# Patient Record
Sex: Male | Born: 1945 | Race: Black or African American | Hispanic: No | Marital: Married | Smoking: Never smoker
Health system: Southern US, Community
[De-identification: ages and names within clinical notes are randomized; demographics above are authoritative.]

## PROBLEM LIST (undated history)

## (undated) DIAGNOSIS — I5189 Other ill-defined heart diseases: Secondary | ICD-10-CM

## (undated) DIAGNOSIS — Z8679 Personal history of other diseases of the circulatory system: Secondary | ICD-10-CM

## (undated) DIAGNOSIS — I629 Nontraumatic intracranial hemorrhage, unspecified: Secondary | ICD-10-CM

## (undated) DIAGNOSIS — S0592XA Unspecified injury of left eye and orbit, initial encounter: Secondary | ICD-10-CM

## (undated) DIAGNOSIS — I2510 Atherosclerotic heart disease of native coronary artery without angina pectoris: Secondary | ICD-10-CM

## (undated) DIAGNOSIS — E119 Type 2 diabetes mellitus without complications: Secondary | ICD-10-CM

## (undated) DIAGNOSIS — I639 Cerebral infarction, unspecified: Secondary | ICD-10-CM

## (undated) DIAGNOSIS — F1010 Alcohol abuse, uncomplicated: Secondary | ICD-10-CM

## (undated) DIAGNOSIS — I213 ST elevation (STEMI) myocardial infarction of unspecified site: Secondary | ICD-10-CM

## (undated) DIAGNOSIS — I119 Hypertensive heart disease without heart failure: Secondary | ICD-10-CM

## (undated) HISTORY — PX: CORONARY ANGIOPLASTY: SHX604

## (undated) HISTORY — DX: Unspecified injury of left eye and orbit, initial encounter: S05.92XA

---

## 2000-09-27 ENCOUNTER — Ambulatory Visit: Admission: RE | Admit: 2000-09-27 | Discharge: 2000-09-27 | Payer: Self-pay | Admitting: *Deleted

## 2014-12-10 ENCOUNTER — Encounter (HOSPITAL_COMMUNITY): Payer: Self-pay | Admitting: Emergency Medicine

## 2014-12-10 ENCOUNTER — Emergency Department (HOSPITAL_COMMUNITY): Payer: Medicare HMO

## 2014-12-10 ENCOUNTER — Inpatient Hospital Stay (HOSPITAL_COMMUNITY): Payer: Medicare HMO

## 2014-12-10 ENCOUNTER — Inpatient Hospital Stay (HOSPITAL_COMMUNITY)
Admission: EM | Admit: 2014-12-10 | Discharge: 2014-12-16 | DRG: 064 | Disposition: A | Discharge status: Rehabilitation Facility | Payer: Medicare HMO | Attending: Neurology | Admitting: Neurology

## 2014-12-10 DIAGNOSIS — Z978 Presence of other specified devices: Secondary | ICD-10-CM | POA: Insufficient documentation

## 2014-12-10 DIAGNOSIS — G936 Cerebral edema: Secondary | ICD-10-CM | POA: Diagnosis present

## 2014-12-10 DIAGNOSIS — R059 Cough, unspecified: Secondary | ICD-10-CM

## 2014-12-10 DIAGNOSIS — E1165 Type 2 diabetes mellitus with hyperglycemia: Secondary | ICD-10-CM | POA: Diagnosis present

## 2014-12-10 DIAGNOSIS — Z8673 Personal history of transient ischemic attack (TIA), and cerebral infarction without residual deficits: Secondary | ICD-10-CM | POA: Diagnosis not present

## 2014-12-10 DIAGNOSIS — R29810 Facial weakness: Secondary | ICD-10-CM | POA: Diagnosis present

## 2014-12-10 DIAGNOSIS — F10239 Alcohol dependence with withdrawal, unspecified: Secondary | ICD-10-CM | POA: Diagnosis not present

## 2014-12-10 DIAGNOSIS — Z79899 Other long term (current) drug therapy: Secondary | ICD-10-CM

## 2014-12-10 DIAGNOSIS — I10 Essential (primary) hypertension: Secondary | ICD-10-CM | POA: Diagnosis present

## 2014-12-10 DIAGNOSIS — E1140 Type 2 diabetes mellitus with diabetic neuropathy, unspecified: Secondary | ICD-10-CM | POA: Diagnosis not present

## 2014-12-10 DIAGNOSIS — I611 Nontraumatic intracerebral hemorrhage in hemisphere, cortical: Principal | ICD-10-CM | POA: Diagnosis present

## 2014-12-10 DIAGNOSIS — G8190 Hemiplegia, unspecified affecting unspecified side: Secondary | ICD-10-CM | POA: Diagnosis not present

## 2014-12-10 DIAGNOSIS — T424X5A Adverse effect of benzodiazepines, initial encounter: Secondary | ICD-10-CM | POA: Diagnosis not present

## 2014-12-10 DIAGNOSIS — F10231 Alcohol dependence with withdrawal delirium: Secondary | ICD-10-CM | POA: Diagnosis not present

## 2014-12-10 DIAGNOSIS — G8191 Hemiplegia, unspecified affecting right dominant side: Secondary | ICD-10-CM | POA: Diagnosis present

## 2014-12-10 DIAGNOSIS — R34 Anuria and oliguria: Secondary | ICD-10-CM | POA: Diagnosis not present

## 2014-12-10 DIAGNOSIS — G9340 Encephalopathy, unspecified: Secondary | ICD-10-CM | POA: Diagnosis not present

## 2014-12-10 DIAGNOSIS — E1142 Type 2 diabetes mellitus with diabetic polyneuropathy: Secondary | ICD-10-CM | POA: Diagnosis present

## 2014-12-10 DIAGNOSIS — F10939 Alcohol use, unspecified with withdrawal, unspecified: Secondary | ICD-10-CM | POA: Insufficient documentation

## 2014-12-10 DIAGNOSIS — I69898 Other sequelae of other cerebrovascular disease: Secondary | ICD-10-CM | POA: Diagnosis not present

## 2014-12-10 DIAGNOSIS — Z789 Other specified health status: Secondary | ICD-10-CM | POA: Diagnosis not present

## 2014-12-10 DIAGNOSIS — R05 Cough: Secondary | ICD-10-CM

## 2014-12-10 DIAGNOSIS — E119 Type 2 diabetes mellitus without complications: Secondary | ICD-10-CM | POA: Diagnosis not present

## 2014-12-10 DIAGNOSIS — R197 Diarrhea, unspecified: Secondary | ICD-10-CM | POA: Diagnosis not present

## 2014-12-10 DIAGNOSIS — B965 Pseudomonas (aeruginosa) (mallei) (pseudomallei) as the cause of diseases classified elsewhere: Secondary | ICD-10-CM | POA: Diagnosis not present

## 2014-12-10 DIAGNOSIS — J9600 Acute respiratory failure, unspecified whether with hypoxia or hypercapnia: Secondary | ICD-10-CM

## 2014-12-10 DIAGNOSIS — I610 Nontraumatic intracerebral hemorrhage in hemisphere, subcortical: Secondary | ICD-10-CM | POA: Diagnosis not present

## 2014-12-10 DIAGNOSIS — I69393 Ataxia following cerebral infarction: Secondary | ICD-10-CM | POA: Diagnosis not present

## 2014-12-10 DIAGNOSIS — R4701 Aphasia: Secondary | ICD-10-CM | POA: Diagnosis present

## 2014-12-10 DIAGNOSIS — Z970 Presence of artificial eye: Secondary | ICD-10-CM

## 2014-12-10 DIAGNOSIS — F10230 Alcohol dependence with withdrawal, uncomplicated: Secondary | ICD-10-CM | POA: Diagnosis not present

## 2014-12-10 DIAGNOSIS — J9602 Acute respiratory failure with hypercapnia: Secondary | ICD-10-CM | POA: Diagnosis not present

## 2014-12-10 DIAGNOSIS — Z01818 Encounter for other preprocedural examination: Secondary | ICD-10-CM

## 2014-12-10 DIAGNOSIS — I6789 Other cerebrovascular disease: Secondary | ICD-10-CM | POA: Diagnosis not present

## 2014-12-10 DIAGNOSIS — Z4659 Encounter for fitting and adjustment of other gastrointestinal appliance and device: Secondary | ICD-10-CM

## 2014-12-10 DIAGNOSIS — Z794 Long term (current) use of insulin: Secondary | ICD-10-CM | POA: Diagnosis not present

## 2014-12-10 DIAGNOSIS — E871 Hypo-osmolality and hyponatremia: Secondary | ICD-10-CM | POA: Diagnosis not present

## 2014-12-10 DIAGNOSIS — E785 Hyperlipidemia, unspecified: Secondary | ICD-10-CM | POA: Diagnosis present

## 2014-12-10 DIAGNOSIS — I619 Nontraumatic intracerebral hemorrhage, unspecified: Secondary | ICD-10-CM

## 2014-12-10 DIAGNOSIS — J9601 Acute respiratory failure with hypoxia: Secondary | ICD-10-CM | POA: Diagnosis not present

## 2014-12-10 DIAGNOSIS — I618 Other nontraumatic intracerebral hemorrhage: Secondary | ICD-10-CM | POA: Diagnosis not present

## 2014-12-10 DIAGNOSIS — E1159 Type 2 diabetes mellitus with other circulatory complications: Secondary | ICD-10-CM | POA: Diagnosis not present

## 2014-12-10 DIAGNOSIS — I639 Cerebral infarction, unspecified: Secondary | ICD-10-CM

## 2014-12-10 HISTORY — DX: Cerebral infarction, unspecified: I63.9

## 2014-12-10 HISTORY — DX: Type 2 diabetes mellitus without complications: E11.9

## 2014-12-10 LAB — CBC
HEMATOCRIT: 41.2 % (ref 39.0–52.0)
Hemoglobin: 14.2 g/dL (ref 13.0–17.0)
MCH: 30.9 pg (ref 26.0–34.0)
MCHC: 34.5 g/dL (ref 30.0–36.0)
MCV: 89.6 fL (ref 78.0–100.0)
Platelets: 238 10*3/uL (ref 150–400)
RBC: 4.6 MIL/uL (ref 4.22–5.81)
RDW: 11.9 % (ref 11.5–15.5)
WBC: 8.6 10*3/uL (ref 4.0–10.5)

## 2014-12-10 LAB — I-STAT CHEM 8, ED
BUN: 11 mg/dL (ref 6–20)
CALCIUM ION: 1.09 mmol/L — AB (ref 1.13–1.30)
CREATININE: 1.1 mg/dL (ref 0.61–1.24)
Chloride: 98 mmol/L — ABNORMAL LOW (ref 101–111)
GLUCOSE: 227 mg/dL — AB (ref 65–99)
HCT: 45 % (ref 39.0–52.0)
HEMOGLOBIN: 15.3 g/dL (ref 13.0–17.0)
POTASSIUM: 3.5 mmol/L (ref 3.5–5.1)
Sodium: 137 mmol/L (ref 135–145)
TCO2: 26 mmol/L (ref 0–100)

## 2014-12-10 LAB — COMPREHENSIVE METABOLIC PANEL
ALK PHOS: 69 U/L (ref 38–126)
ALT: 15 U/L — AB (ref 17–63)
ANION GAP: 9 (ref 5–15)
AST: 25 U/L (ref 15–41)
Albumin: 4.1 g/dL (ref 3.5–5.0)
BILIRUBIN TOTAL: 0.8 mg/dL (ref 0.3–1.2)
BUN: 12 mg/dL (ref 6–20)
CALCIUM: 8.8 mg/dL — AB (ref 8.9–10.3)
CO2: 28 mmol/L (ref 22–32)
CREATININE: 1.13 mg/dL (ref 0.61–1.24)
Chloride: 99 mmol/L — ABNORMAL LOW (ref 101–111)
Glucose, Bld: 225 mg/dL — ABNORMAL HIGH (ref 65–99)
Potassium: 3.5 mmol/L (ref 3.5–5.1)
SODIUM: 136 mmol/L (ref 135–145)
TOTAL PROTEIN: 7.3 g/dL (ref 6.5–8.1)

## 2014-12-10 LAB — DIFFERENTIAL
Basophils Absolute: 0 10*3/uL (ref 0.0–0.1)
Basophils Relative: 0 %
EOS PCT: 3 %
Eosinophils Absolute: 0.3 10*3/uL (ref 0.0–0.7)
LYMPHS ABS: 2.5 10*3/uL (ref 0.7–4.0)
LYMPHS PCT: 29 %
MONO ABS: 0.6 10*3/uL (ref 0.1–1.0)
MONOS PCT: 8 %
NEUTROS ABS: 5.2 10*3/uL (ref 1.7–7.7)
Neutrophils Relative %: 60 %

## 2014-12-10 LAB — CBG MONITORING, ED: Glucose-Capillary: 220 mg/dL — ABNORMAL HIGH (ref 65–99)

## 2014-12-10 LAB — MRSA PCR SCREENING: MRSA by PCR: NEGATIVE

## 2014-12-10 LAB — PROTIME-INR
INR: 1.02 (ref 0.00–1.49)
PROTHROMBIN TIME: 13.6 s (ref 11.6–15.2)

## 2014-12-10 LAB — GLUCOSE, CAPILLARY
Glucose-Capillary: 207 mg/dL — ABNORMAL HIGH (ref 65–99)
Glucose-Capillary: 230 mg/dL — ABNORMAL HIGH (ref 65–99)

## 2014-12-10 LAB — APTT: aPTT: 27 seconds (ref 24–37)

## 2014-12-10 LAB — I-STAT TROPONIN, ED: Troponin i, poc: 0.01 ng/mL (ref 0.00–0.08)

## 2014-12-10 MED ORDER — NICARDIPINE HCL IN NACL 20-0.86 MG/200ML-% IV SOLN
3.0000 mg/h | Freq: Once | INTRAVENOUS | Status: AC
  Administered 2014-12-10: 5 mg/h via INTRAVENOUS
  Filled 2014-12-10: qty 200

## 2014-12-10 MED ORDER — SODIUM CHLORIDE 0.9 % IV SOLN
INTRAVENOUS | Status: DC
  Administered 2014-12-10 – 2014-12-12 (×2): via INTRAVENOUS

## 2014-12-10 MED ORDER — GADOBENATE DIMEGLUMINE 529 MG/ML IV SOLN
20.0000 mL | Freq: Once | INTRAVENOUS | Status: AC | PRN
  Administered 2014-12-10: 20 mL via INTRAVENOUS

## 2014-12-10 MED ORDER — LABETALOL HCL 5 MG/ML IV SOLN
10.0000 mg | INTRAVENOUS | Status: DC | PRN
  Administered 2014-12-11: 20 mg via INTRAVENOUS
  Administered 2014-12-12: 40 mg via INTRAVENOUS
  Administered 2014-12-12: 20 mg via INTRAVENOUS
  Administered 2014-12-14 (×2): 10 mg via INTRAVENOUS
  Filled 2014-12-10 (×3): qty 4
  Filled 2014-12-10: qty 8
  Filled 2014-12-10 (×2): qty 4

## 2014-12-10 MED ORDER — ACETAMINOPHEN 325 MG PO TABS
650.0000 mg | ORAL_TABLET | ORAL | Status: DC | PRN
Start: 2014-12-10 — End: 2014-12-12
  Administered 2014-12-10: 650 mg via ORAL
  Filled 2014-12-10: qty 2

## 2014-12-10 MED ORDER — ACETAMINOPHEN 650 MG RE SUPP: 650.0000 mg | RECTAL | Status: DC | PRN

## 2014-12-10 MED ORDER — STROKE: EARLY STAGES OF RECOVERY BOOK
Freq: Once | Status: DC
  Filled 2014-12-10: qty 1

## 2014-12-10 MED ORDER — INSULIN ASPART 100 UNIT/ML ~~LOC~~ SOLN
0.0000 [IU] | Freq: Three times a day (TID) | SUBCUTANEOUS | Status: DC
  Administered 2014-12-11: 5 [IU] via SUBCUTANEOUS
  Administered 2014-12-11: 2 [IU] via SUBCUTANEOUS
  Administered 2014-12-11: 8 [IU] via SUBCUTANEOUS
  Administered 2014-12-12: 3 [IU] via SUBCUTANEOUS
  Administered 2014-12-12: 5 [IU] via SUBCUTANEOUS

## 2014-12-10 MED ORDER — NICARDIPINE HCL IN NACL 20-0.86 MG/200ML-% IV SOLN
3.0000 mg/h | INTRAVENOUS | Status: DC
  Administered 2014-12-10: 12.5 mg/h via INTRAVENOUS
  Administered 2014-12-10: 5 mg/h via INTRAVENOUS
  Filled 2014-12-10 (×4): qty 200

## 2014-12-10 MED ORDER — PANTOPRAZOLE SODIUM 40 MG IV SOLR
40.0000 mg | Freq: Every day | INTRAVENOUS | Status: DC
  Administered 2014-12-10 – 2014-12-11 (×2): 40 mg via INTRAVENOUS
  Filled 2014-12-10 (×2): qty 40

## 2014-12-10 MED ORDER — SENNOSIDES-DOCUSATE SODIUM 8.6-50 MG PO TABS
1.0000 | ORAL_TABLET | Freq: Two times a day (BID) | ORAL | Status: DC
  Administered 2014-12-10 – 2014-12-11 (×3): 1 via ORAL
  Filled 2014-12-10 (×3): qty 1

## 2014-12-10 NOTE — H&P (Addendum)
Admission H&P  Referring physician: [facility] Chief Complaint: aphasia, right face weakness.  HPI: Richard [name] is an 69 y.o. male, right handed, with a past medical history significant for DM, stroke without residual deficits, s/p artificial left eye, brought in for evaluation of the aforementioned symptoms/signs.  Patient son is at the bedside and indicated that his father was last seen normal around 11 pm last night, but when he woke up this morning he was not able to speak fluently and he noticed weakness of the right side of his face, and thus he became concerned and brought patient to the ED. Upon arrival to the ED, patient was described as awake and alert but aphasic with right face weakness and SBP>200. Urgent CT brain was obtained, personally reviewed, and showed evidence of a lobar left frontal ICH measuring 3.5 x 2.5 cm transverse, with an estimated ICH volume of approximately of 17.5 ml. There is mild surrounding vasogenic edema but no IV extension or hydrocephalus.  Currently, patient is able to follow commands but has expressive aphasia. Complains of mild HA but denies vertigo, double vision, focal numbness, difficulty swallowing. No recent head trauma, no use of anticoagulants or antiplatelets. Son reports no concerns regarding his father cognitive skills. Started on nicardipine drip as his BP remains >190. Serologies reviewed: PTT 27, INR 1.02, platelets 238.   LSN: 11 pm on 12/09/14 tPA Given: no, ICH ICH volume: 17.5 ml ICH score: 0   Past Medical History  Diagnosis Date  . Diabetes mellitus without complication   . Stroke     History reviewed. No pertinent past surgical history.  No family history on file. Social History:  reports that he has never smoked. He does not have any smokeless tobacco history on file. He reports that he drinks alcohol. His drug history is not on file.  Allergies: No Known Allergies   (Not in a hospital admission)  ROS: unable to obtain due  to motor aphasia.   Physical Examination: Blood pressure 191/110, pulse 79, temperature 98.7 F (37.1 C), temperature source Oral, resp. rate 20, height 6\' 2"  (1.88 m), weight 102.059 kg (225 lb), SpO2 98 %. Constitutional: well developed male in no apparent distress. HEENT-  Normocephalic, no lesions, without obvious abnormality.  Normal external eye and conjunctiva.  Normal TM's bilaterally.  Normal auditory canals and external ears. Normal external nose, mucus membranes and septum.  Normal pharynx. Neck supple with no masses, nodes, nodules or enlargement. Cardiovascular - no murmurs, no gallops. Lungs - clear Abdomen - soft, non-tender; bowel sounds normal; no masses,  no organomegaly Extremities - no edema, no skin discoloration, no clubbing and no cyanosis  Skin: no rash  Neurologic Examination: General: Mental Status: Alert and awake, follows commands but at times with some difficulty, frank expressive aphasia. 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 III,IV, VI: ptosis not present, extra-ocular motions intact bilaterally V,VII: smile asymmetric due to right face weakness, facial light touch sensation normal bilaterally VIII: hearing normal bilaterally IX,X: uvula rises symmetrically XI: bilateral shoulder shrug XII: midline tongue extension without atrophy or fasciculations  Motor: Mild drift right arm, otherwise no frank weakness Tone and bulk:normal tone throughout; no atrophy noted Sensory: Pinprick and light touch intact throughout, bilaterally Deep Tendon Reflexes:  Right: Upper Extremity   Left: Upper extremity  biceps (C-5 to C-6) 2/4   biceps (C-5 to C-6) 2/4 tricep (C7) 2/4    triceps (C7) 2/4 Brachioradialis (C6) 2/4  Brachioradialis (C6) 2/4  Lower Extremity Lower Extremity   quadriceps (L-2 to L-4) 2/4   quadriceps (L-2 to L-4) 2/4 Achilles (S1) 2/4   Achilles (S1) 2/4  Plantars: Right: downgoing   Left: downgoing Cerebellar: normal finger-to-nose,  normal heel-to-shin test Gait:  No tested due to multiple leads.    Results for orders placed or performed during the hospital encounter of 12/10/14 (from the past 48 hour(s))  Protime-INR     Status: None   Collection Time: 12/10/14  9:20 AM  Result Value Ref Range   Prothrombin Time 13.6 11.6 - 15.2 seconds   INR 1.02 0.00 - 1.49  APTT     Status: None   Collection Time: 12/10/14  9:20 AM  Result Value Ref Range   aPTT 27 24 - 37 seconds  CBC     Status: None   Collection Time: 12/10/14  9:20 AM  Result Value Ref Range   WBC 8.6 4.0 - 10.5 K/uL   RBC 4.60 4.22 - 5.81 MIL/uL   Hemoglobin 14.2 13.0 - 17.0 g/dL   HCT 41.2 39.0 - 52.0 %   MCV 89.6 78.0 - 100.0 fL   MCH 30.9 26.0 - 34.0 pg   MCHC 34.5 30.0 - 36.0 g/dL   RDW 11.9 11.5 - 15.5 %   Platelets 238 150 - 400 K/uL  Differential     Status: None   Collection Time: 12/10/14  9:20 AM  Result Value Ref Range   Neutrophils Relative % 60 %   Neutro Abs 5.2 1.7 - 7.7 K/uL   Lymphocytes Relative 29 %   Lymphs Abs 2.5 0.7 - 4.0 K/uL   Monocytes Relative 8 %   Monocytes Absolute 0.6 0.1 - 1.0 K/uL   Eosinophils Relative 3 %   Eosinophils Absolute 0.3 0.0 - 0.7 K/uL   Basophils Relative 0 %   Basophils Absolute 0.0 0.0 - 0.1 K/uL  Comprehensive metabolic panel     Status: Abnormal   Collection Time: 12/10/14  9:20 AM  Result Value Ref Range   Sodium 136 135 - 145 mmol/L   Potassium 3.5 3.5 - 5.1 mmol/L   Chloride 99 (L) 101 - 111 mmol/L   CO2 28 22 - 32 mmol/L   Glucose, Bld 225 (H) 65 - 99 mg/dL   BUN 12 6 - 20 mg/dL   Creatinine, Ser 1.13 0.61 - 1.24 mg/dL   Calcium 8.8 (L) 8.9 - 10.3 mg/dL   Total Protein 7.3 6.5 - 8.1 g/dL   Albumin 4.1 3.5 - 5.0 g/dL   AST 25 15 - 41 U/L   ALT 15 (L) 17 - 63 U/L   Alkaline Phosphatase 69 38 -  126 U/L   Total Bilirubin 0.8 0.3 - 1.2 mg/dL   GFR calc non Af Amer >60 >60 mL/min   GFR calc Af Amer >60 >60 mL/min    Comment: (NOTE) The eGFR has been calculated using the CKD EPI equation. This calculation has not been validated in all clinical situations. eGFR's persistently <60 mL/min signify possible Chronic Kidney Disease.    Anion gap 9 5 - 15  CBG monitoring, ED     Status: Abnormal   Collection Time: 12/10/14  9:22 AM  Result Value Ref Range   Glucose-Capillary 220 (H) 65 - 99 mg/dL  I-stat troponin, ED (not at [facility])     Status: None   Collection Time: 12/10/14  9:27 AM  Result Value Ref Range   Troponin i, poc 0.01 0.00 - 0.08 ng/mL   Comment 3            Comment: Due to the release kinetics of cTnI, a negative result within the first hours of the onset of symptoms does not rule out myocardial infarction with certainty. If myocardial infarction is still suspected, repeat the test at appropriate intervals.   I-Stat Chem 8, ED  (not at [facility])     Status: Abnormal   Collection Time: 12/10/14  9:28 AM  Result Value Ref Range   Sodium 137 135 - 145 mmol/L   Potassium 3.5 3.5 - 5.1 mmol/L   Chloride 98 (L) 101 - 111 mmol/L   BUN 11 6 - 20 mg/dL   Creatinine, Ser 1.10 0.61 - 1.24 mg/dL   Glucose, Bld 227 (H) 65 - 99 mg/dL   Calcium, Ion 1.09 (L) 1.13 - 1.30 mmol/L   TCO2 26 0 - 100 mmol/L   Hemoglobin 15.3 13.0 - 17.0 g/dL   HCT 45.0 39.0 - 52.0 %   Ct Head Wo Contrast  12/10/2014   CLINICAL DATA:  Right facial droop and aphasia. Last seen normal last night. History of diabetes. Initial encounter.  EXAM: CT HEAD WITHOUT CONTRAST  TECHNIQUE: Contiguous axial images were obtained from the base of the skull through the vertex without intravenous contrast.  COMPARISON:  None.  FINDINGS: There is an acute hematoma within the posterior left frontal white matter, measuring 3.5 x 2.5 cm transverse on image 19. This has an estimated volume of approximately of 17.5 ml.  There is mild surrounding vasogenic edema. There is loss of gray-white differentiation within the overlying cortex superiorly. No midline shift, hydrocephalus, extra-axial fluid collection or other areas of intracranial hemorrhage demonstrated. There is confluent low-density within the periventricular and subcortical white matter bilaterally. Intracranial vascular calcifications noted.  There is mild ethmoid sinus mucosal thickening. Left ocular prosthesis noted. The visualized paranasal sinuses, mastoid air cells and middle ears are otherwise clear. The calvarium is intact.  IMPRESSION: 1. Acute left frontal hemorrhage with surrounding edema, likely representing atypical hemorrhagic stroke. A hemorrhagic mass is considered less likely. CT or MR follow up suggested. 2. Confluent low-density in the periventricular and subcortical white matter consistent with chronic small vessel ischemic changes. 3. Critical Value/emergent results were called by telephone at the time of interpretation on 12/10/2014 at 9:40 am to Dr. Serita Grit , who verbally acknowledged these results.   Electronically Signed   By: Richardean Sale M.D.   On: 12/10/2014 09:42    Assessment: 69 y.o. male with acute onset expressive aphasia, right face weakness, subtle right arm weakness, and CT brain demonstrating evidence of a lobar left frontal ICH measuring 3.5 x 2.5 cm transverse, with an estimated ICH volume of approximately of 17.5 ml. There is mild surrounding vasogenic edema but no IV extension or hydrocephalus.  Patient markedly hypertensive in the ED requiring initiating nicardipine infusion, but other less likely etiologies for this lobar ICH include hemorrhage within a tumor, CAA. ICH score 0, which as a general rule portents a good prognosis. Recommended: 1) Transfer to [facility] 2) Nicardipine drip, target SBP < 160 3) No osmotherapy, as no significant cerebral edema or midline shift at this moment 4) No antiplatelets or  anticoagulants 5) MRI/MRA brain if stroke team concurs. 6) Follow non traumatic ICH protocol Stroke team  will follow up tomorrow.    Stroke Risk Factors - age, DM, prior stroke, HTN (apparently unknown until today)  Dorian Pod ,MD [facility] [phone]  12/10/2014, 10:10 AM

## 2014-12-10 NOTE — ED Provider Notes (Signed)
CSN: [id number]     Arrival date & time 12/10/14  0912 History   First MD Initiated Contact with Patient 12/10/14 [phone]     Chief Complaint  Patient presents with  . Facial Droop     (Consider location/radiation/quality/duration/timing/severity/associated sxs/prior Treatment) HPI Comments: 69 yo male with right sided facial weakness, right arm and leg weakness, and dysarthria / dysphasia.  Unable to obtain clear history from patient due to dysphasia.  Level V caveat applies.  Patient is a 69 y.o. male presenting with neurologic complaint.  Neurologic Problem This is a new problem. Episode onset: unknown.  last seen normal 10 hours ago.   The problem occurs constantly. The problem has not changed since onset.Nothing aggravates the symptoms. Nothing relieves the symptoms.    Past Medical History  Diagnosis Date  . Diabetes mellitus without complication   . Stroke    History reviewed. No pertinent past surgical history. No family history on file. Social History  Substance Use Topics  . Smoking status: Never Smoker   . Smokeless tobacco: None  . Alcohol Use: Yes     Comment: occasional     Review of Systems  Unable to perform ROS: Patient nonverbal      Allergies  Review of patient's allergies indicates no known allergies.  Home Medications   Prior to Admission medications   Not on File   BP 191/110 mmHg  Pulse 79  Temp(Src) 98.7 F (37.1 C) (Oral)  Resp 20  Ht  (1.88 m)  Wt 225 lb (102.059 kg)  BMI 28.88 kg/m2  SpO2 98% Physical Exam  Constitutional: He is oriented to person, place, and time. He appears well-developed and well-nourished. No distress.  HENT:  Head: Normocephalic and atraumatic.  Mouth/Throat: Oropharynx is clear and moist.  Eyes: Conjunctivae are normal. Pupils are equal, round, and reactive to light. No scleral icterus.  Neck: Neck supple.  Cardiovascular: Normal rate, regular rhythm, normal heart sounds and intact distal pulses.   No  murmur heard. Pulmonary/Chest: Effort normal and breath sounds normal. No stridor. No respiratory distress. He has no wheezes. He has no rales.  Abdominal: Soft. He exhibits no distension. There is no tenderness.  Musculoskeletal: Normal range of motion. He exhibits no edema.  Neurological: He is alert and oriented to person, place, and time. A cranial nerve deficit (right facial weakness, forehead sparing.) is present.  Right arm and leg weakness.  Normal strength on left.    Skin: Skin is warm and dry. No rash noted.  Psychiatric: He has a normal mood and affect. His behavior is normal.  Nursing note and vitals reviewed.   ED Course  CRITICAL CARE Performed by: Blake Divine Authorized by: Blake Divine Total critical care time: 35 minutes Critical care time was exclusive of separately billable procedures and treating other patients. Critical care was necessary to treat or prevent imminent or life-threatening deterioration of the following conditions: CNS failure or compromise. Critical care was time spent personally by me on the following activities: development of treatment plan with patient or surrogate, discussions with consultants, evaluation of patient's response to treatment, examination of patient, obtaining history from patient or surrogate, ordering and performing treatments and interventions, ordering and review of laboratory studies, ordering and review of radiographic studies, pulse oximetry, re-evaluation of patient's condition and review of old charts.   (including critical care time) Labs Review Labs Reviewed  CBG MONITORING, ED - Abnormal; Notable for the following:    Glucose-Capillary 220 (*)  All other components within normal limits  PROTIME-INR  APTT  CBC  DIFFERENTIAL  COMPREHENSIVE METABOLIC PANEL  I-STAT TROPOININ, ED  I-STAT CHEM 8, ED    Imaging Review Ct Head Wo Contrast  12/10/2014   CLINICAL DATA:  Right facial droop and aphasia. Last seen normal  last night. History of diabetes. Initial encounter.  EXAM: CT HEAD WITHOUT CONTRAST  TECHNIQUE: Contiguous axial images were obtained from the base of the skull through the vertex without intravenous contrast.  COMPARISON:  None.  FINDINGS: There is an acute hematoma within the posterior left frontal white matter, measuring 3.5 x 2.5 cm transverse on image 19. This has an estimated volume of approximately of 17.5 ml. There is mild surrounding vasogenic edema. There is loss of gray-white differentiation within the overlying cortex superiorly. No midline shift, hydrocephalus, extra-axial fluid collection or other areas of intracranial hemorrhage demonstrated. There is confluent low-density within the periventricular and subcortical white matter bilaterally. Intracranial vascular calcifications noted.  There is mild ethmoid sinus mucosal thickening. Left ocular prosthesis noted. The visualized paranasal sinuses, mastoid air cells and middle ears are otherwise clear. The calvarium is intact.  IMPRESSION: 1. Acute left frontal hemorrhage with surrounding edema, likely representing atypical hemorrhagic stroke. A hemorrhagic mass is considered less likely. CT or MR follow up suggested. 2. Confluent low-density in the periventricular and subcortical white matter consistent with chronic small vessel ischemic changes. 3. Critical Value/emergent results were called by telephone at the time of interpretation on 12/10/2014 at 9:40 am to Dr. Blake Divine , who verbally acknowledged these results.   Electronically Signed   By: Carey Bullocks M.D.   On: 12/10/2014 09:42   I have personally reviewed and evaluated these images and lab results as part of my medical decision-making.   EKG Interpretation   Date/Time:  Wednesday December 10 2014 09:16:47 EDT Ventricular Rate:  79 PR Interval:  157 QRS Duration: 90 QT Interval:  370 QTC Calculation: 424 R Axis:   -8 Text Interpretation:  Sinus rhythm Nonspecific T  abnormalities, anterior  leads No old tracing to compare Confirmed by [facility]  MD, TREY ([id number]) on  12/10/2014 10:12:50 AM      MDM   Final diagnoses:  Hemorrhagic stroke  Cerebral hemorrhage    69 yo male presenting with stroke symptoms found to have acute hemorrhagic stroke.  He required nicardipine drip to control his hypertension.  Will be admitted to ICU by Dr. Cyril Mourning (Neurology).      Blake Divine, MD 12/10/14 [phone]

## 2014-12-10 NOTE — ED Notes (Signed)
Pt's son states that father having right side facial droop and aphasia.  Pt last known normal was when he went to bed last night around 11pm.  Pt's son states that his mother woke him up bc of his father's symptoms.

## 2014-12-10 NOTE — ED Notes (Signed)
Patient transported to CT 

## 2014-12-10 NOTE — Progress Notes (Signed)
Dr. Stewart notified of pt's arrival.

## 2014-12-10 NOTE — Progress Notes (Addendum)
ED CM attempted to obtain pcp from pt but he at this time is unable to verbalized the pcp name Pt made attempts with stating "H O " and unable to further provide name Cm asked him if CM had permission to contact the wife.  Pt son at bedside, Domingo Cocking but he is unaware of pcp name Cm called wife at the home number [facility] answered by name She reports she is bed/house bound for over a year and will not be able to visit pt but "I give my permission for you and any one of you to tell his son, Domingo Cocking, all information and he can provide it to me" after she began to ask various questions about pt medical information that Cm is not aware of the answers  CM updated ED RN of pcp name, entry in EPIC and wife giving permission for all information to be shared with son, Domingo Cocking to be shared with her as needed The wife confirms pt preferred [facility] is [facility] and she gave as much information to [facility] pharmacy staff as possible

## 2014-12-10 NOTE — ED Notes (Signed)
Patient transported to MRI 

## 2014-12-11 ENCOUNTER — Inpatient Hospital Stay (HOSPITAL_COMMUNITY): Payer: Medicare HMO

## 2014-12-11 DIAGNOSIS — I10 Essential (primary) hypertension: Secondary | ICD-10-CM

## 2014-12-11 DIAGNOSIS — I6789 Other cerebrovascular disease: Secondary | ICD-10-CM

## 2014-12-11 DIAGNOSIS — E785 Hyperlipidemia, unspecified: Secondary | ICD-10-CM

## 2014-12-11 DIAGNOSIS — E1159 Type 2 diabetes mellitus with other circulatory complications: Secondary | ICD-10-CM

## 2014-12-11 LAB — LIPID PANEL
CHOL/HDL RATIO: 4.6 ratio
CHOLESTEROL: 187 mg/dL (ref 0–200)
HDL: 41 mg/dL (ref >40)
LDL Cholesterol: 102 mg/dL — ABNORMAL HIGH (ref 0–99)
Triglycerides: 218 mg/dL — ABNORMAL HIGH (ref <150)
VLDL: 44 mg/dL — ABNORMAL HIGH (ref 0–40)

## 2014-12-11 LAB — URINALYSIS W MICROSCOPIC (NOT AT ARMC)
BILIRUBIN URINE: NEGATIVE
Hgb urine dipstick: NEGATIVE
Ketones, ur: 40 mg/dL — AB
LEUKOCYTES UA: NEGATIVE
Nitrite: NEGATIVE
PH: 7 (ref 5.0–8.0)
Protein, ur: NEGATIVE mg/dL
SPECIFIC GRAVITY, URINE: 1.026 (ref 1.005–1.030)
UROBILINOGEN UA: 1 mg/dL (ref 0.0–1.0)

## 2014-12-11 LAB — TSH: TSH: 1.983 u[IU]/mL (ref 0.350–4.500)

## 2014-12-11 LAB — BASIC METABOLIC PANEL
Anion gap: 12 (ref 5–15)
CALCIUM: 8.5 mg/dL — AB (ref 8.9–10.3)
CHLORIDE: 95 mmol/L — AB (ref 101–111)
CO2: 24 mmol/L (ref 22–32)
CREATININE: 0.87 mg/dL (ref 0.61–1.24)
GFR calc non Af Amer: >60 mL/min (ref >60)
Glucose, Bld: 271 mg/dL — ABNORMAL HIGH (ref 65–99)
Potassium: 3.1 mmol/L — ABNORMAL LOW (ref 3.5–5.1)
SODIUM: 131 mmol/L — AB (ref 135–145)

## 2014-12-11 LAB — GLUCOSE, CAPILLARY
GLUCOSE-CAPILLARY: 233 mg/dL — AB (ref 65–99)
Glucose-Capillary: 284 mg/dL — ABNORMAL HIGH (ref 65–99)
Glucose-Capillary: 228 mg/dL — ABNORMAL HIGH (ref 65–99)
Glucose-Capillary: 128 mg/dL — ABNORMAL HIGH (ref 65–99)

## 2014-12-11 LAB — CBC
HCT: 38.9 % — ABNORMAL LOW (ref 39.0–52.0)
Hemoglobin: 13.4 g/dL (ref 13.0–17.0)
MCH: 30.7 pg (ref 26.0–34.0)
MCHC: 34.4 g/dL (ref 30.0–36.0)
MCV: 89 fL (ref 78.0–100.0)
PLATELETS: 221 10*3/uL (ref 150–400)
RBC: 4.37 MIL/uL (ref 4.22–5.81)
RDW: 11.8 % (ref 11.5–15.5)
WBC: 9 10*3/uL (ref 4.0–10.5)

## 2014-12-11 LAB — RAPID URINE DRUG SCREEN, HOSP PERFORMED
Amphetamines: NOT DETECTED
BARBITURATES: NOT DETECTED
BENZODIAZEPINES: NOT DETECTED
COCAINE: NOT DETECTED
Opiates: NOT DETECTED
TETRAHYDROCANNABINOL: NOT DETECTED

## 2014-12-11 LAB — VITAMIN B12: VITAMIN B 12: 240 pg/mL (ref 180–914)

## 2014-12-11 LAB — SODIUM: SODIUM: 132 mmol/L — AB (ref 135–145)

## 2014-12-11 MED ORDER — LISINOPRIL 20 MG PO TABS
20.0000 mg | ORAL_TABLET | Freq: Every day | ORAL | Status: DC
  Administered 2014-12-11: 20 mg via ORAL
  Filled 2014-12-11: qty 1

## 2014-12-11 MED ORDER — LORAZEPAM 1 MG PO TABS: 1.0000 mg | ORAL_TABLET | Freq: Four times a day (QID) | ORAL | Status: DC | PRN

## 2014-12-11 MED ORDER — VITAMIN B-12 100 MCG PO TABS
100.0000 ug | ORAL_TABLET | Freq: Every day | ORAL | Status: DC
  Filled 2014-12-11: qty 1

## 2014-12-11 MED ORDER — CYANOCOBALAMIN 1000 MCG/ML IJ SOLN
1000.0000 ug | Freq: Once | INTRAMUSCULAR | Status: AC
  Administered 2014-12-11: 1000 ug via INTRAMUSCULAR
  Filled 2014-12-11: qty 1

## 2014-12-11 MED ORDER — CARVEDILOL 12.5 MG PO TABS
25.0000 mg | ORAL_TABLET | Freq: Two times a day (BID) | ORAL | Status: DC
  Administered 2014-12-11 (×2): 25 mg via ORAL
  Filled 2014-12-11 (×3): qty 2

## 2014-12-11 MED ORDER — LORAZEPAM 2 MG/ML IJ SOLN
1.0000 mg | Freq: Four times a day (QID) | INTRAMUSCULAR | Status: DC | PRN
  Administered 2014-12-12 (×2): 1 mg via INTRAVENOUS
  Filled 2014-12-11 (×2): qty 1

## 2014-12-11 MED ORDER — LISINOPRIL 20 MG PO TABS
20.0000 mg | ORAL_TABLET | Freq: Two times a day (BID) | ORAL | Status: DC
  Administered 2014-12-11: 20 mg via ORAL
  Filled 2014-12-11: qty 1

## 2014-12-11 MED ORDER — FOLIC ACID 1 MG PO TABS
1.0000 mg | ORAL_TABLET | Freq: Every day | ORAL | Status: DC
  Administered 2014-12-11: 1 mg via ORAL
  Filled 2014-12-11: qty 1

## 2014-12-11 MED ORDER — VITAMIN B-1 100 MG PO TABS: 100.0000 mg | ORAL_TABLET | Freq: Every day | ORAL | Status: DC

## 2014-12-11 MED ORDER — THIAMINE HCL 100 MG/ML IJ SOLN
100.0000 mg | Freq: Every day | INTRAMUSCULAR | Status: DC
  Administered 2014-12-11 – 2014-12-12 (×2): 100 mg via INTRAVENOUS
  Filled 2014-12-11 (×2): qty 2

## 2014-12-11 MED ORDER — ATORVASTATIN CALCIUM 10 MG PO TABS
10.0000 mg | ORAL_TABLET | Freq: Every day | ORAL | Status: DC
  Administered 2014-12-11: 10 mg via ORAL
  Filled 2014-12-11: qty 1

## 2014-12-11 MED ORDER — POTASSIUM CHLORIDE CRYS ER 20 MEQ PO TBCR
40.0000 meq | EXTENDED_RELEASE_TABLET | ORAL | Status: AC
  Administered 2014-12-11 (×3): 40 meq via ORAL
  Filled 2014-12-11 (×3): qty 2

## 2014-12-11 MED ORDER — ADULT MULTIVITAMIN W/MINERALS CH
1.0000 | ORAL_TABLET | Freq: Every day | ORAL | Status: DC
  Administered 2014-12-11: 1 via ORAL
  Filled 2014-12-11: qty 1

## 2014-12-11 NOTE — Evaluation (Signed)
Physical Therapy Evaluation Patient Details Name: Richard [name] MRN: [id number] DOB: 04/12/1945 Today's Date: 12/11/2014   History of Present Illness  Richard [name] is an 69 y.o. male, right handed, with a past medical history significant for DM, stroke without residual deficits, s/p artificial left eye, brought in for evaluation of aphasia, R facial weakness and R sided weakness.  CT/MRI shows L frontal ICH  Clinical Impression  Pt admitted with/for aphasia and right facial weakness due to L ICH.  Pt currently limited functionally due to the problems listed. ( See problems list.)   Pt will benefit from PT to maximize function and safety in order to get ready for next venue listed below.     Follow Up Recommendations CIR    Equipment Recommendations  Other (comment) (TBA)    Recommendations for Other Services Rehab consult     Precautions / Restrictions Precautions Precautions: Fall      Mobility  Bed Mobility Overal bed mobility: Needs Assistance Bed Mobility: Supine to Sit;Sit to Supine     Supine to sit: Min assist Sit to supine: Min assist   General bed mobility comments: min physical cues for direction and min truncal assist  Transfers Overall transfer level: Needs assistance Equipment used: 1 person hand held assist Transfers: Sit to/from Stand Sit to Stand: Mod assist;+2 safety/equipment;+2 physical assistance         General transfer comment: physical assist for direction due to no apparent verbal comprehension.  Steady assist  Ambulation/Gait Ambulation/Gait assistance: Mod assist;+2 safety/equipment;+2 physical assistance Ambulation Distance (Feet): 50 Feet Assistive device: 1 person hand held assist;2 person hand held assist Gait Pattern/deviations: Step-through pattern;Scissoring;Ataxic;Wide base of support Gait velocity: slow and deliberate Gait velocity interpretation: Below normal speed for age/gender General Gait Details: pt with staggered, scissored  wide BOS gait, Pt with R gaze preference, only looking R if head physically turned right.  Pt hitting obstacles on the right and unable to problem solve how to get around the obstacle without physical direction.  Stairs            Wheelchair Mobility    Modified Rankin (Stroke Patients Only) Modified Rankin (Stroke Patients Only) Pre-Morbid Rankin Score: No symptoms Modified Rankin: Moderately severe disability     Balance                                             Pertinent Vitals/Pain Pain Assessment: Faces Faces Pain Scale: No hurt    Home Living                   Additional Comments: Unable to get history from pt, no family present    Prior Function           Comments: no family present to ascertain PLOF     Hand Dominance   Dominant Hand: Right    Extremity/Trunk Assessment   Upper Extremity Assessment:  (holds R arm against his side, moves grossly)           Lower Extremity Assessment: LLE deficits/detail;RLE deficits/detail RLE Deficits / Details: moving against gravity, but not coordinated.  Unable to MMT due to unable to follow direction/  Gross extension 4+/5 LLE Deficits / Details: appears WFL without formal MMT     Communication   Communication: Expressive difficulties;Receptive difficulties  Cognition Arousal/Alertness: Awake/alert Behavior During Therapy: Flat affect Overall Cognitive Status: Difficult  to assess                      General Comments      Exercises        Assessment/Plan    PT Assessment Patient needs continued PT services  PT Diagnosis Difficulty walking;Generalized weakness;Other (comment) (hemiparesis)   PT Problem List Decreased strength;Decreased activity tolerance;Decreased balance;Decreased mobility;Decreased coordination;Decreased safety awareness;Decreased knowledge of precautions  PT Treatment Interventions DME instruction;Gait training;Functional mobility  training;Therapeutic activities;Balance training;Neuromuscular re-education;Patient/family education   PT Goals (Current goals can be found in the Care Plan section) Acute Rehab PT Goals PT Goal Formulation: Patient unable to participate in goal setting Time For Goal Achievement: 12/25/14 Potential to Achieve Goals: Good    Frequency Min 3X/week   Barriers to discharge        Co-evaluation               End of Session   Activity Tolerance: Patient tolerated treatment well Patient left: in bed;with call bell/phone within reach;with bed alarm set;with nursing/sitter in room Nurse Communication: Mobility status         Time: 4782-9562 PT Time Calculation (min) (ACUTE ONLY): 20 min   Charges:   PT Evaluation $Initial PT Evaluation Tier I: 1 Procedure     PT G Codes:        Lekeya Rollings, Eliseo Gum 12/11/2014, 5:50 PM 12/11/2014  Pierce Bing, PT [phone]  (pager)

## 2014-12-11 NOTE — Evaluation (Signed)
Speech Language Pathology Evaluation Patient Details Name: Richard [name] MRN: [id number] DOB: 06-03-1945 Today's Date: 12/11/2014 Time: 7829-5621 SLP Time Calculation (min) (ACUTE ONLY): 20 min  Problem List:  Patient Active Problem List   Diagnosis Date Noted  . ICH (intracerebral hemorrhage) 12/10/2014   Past Medical History:  Past Medical History  Diagnosis Date  . Diabetes mellitus without complication   . Stroke    Past Surgical History: History reviewed. No pertinent past surgical history. HPI:  69 y.o. male with acute onset expressive aphasia, right face weakness, subtle right arm weakness, and CT brain demonstrating evidence of a lobar left frontal ICH    Assessment / Plan / Recommendation Clinical Impression  Pt presents with a global aphasia marked by poor auditory comprehension with impaired ability to follow commands or discriminate between functional objects; expression is limited to single word utterances (ok, yes, and no produced during session - no other words could be elicited.) Pt demonstrated occasional, broad gestures in an effort to communicate.  Recommend SLP to address basic communication.       SLP Assessment  Patient needs continued Speech Lanaguage Pathology Services    Follow Up Recommendations  Inpatient Rehab    Frequency and Duration min 3x week  2 weeks        SLP Goals  Potential to Achieve Goals (ACUTE ONLY): Fair  SLP Evaluation Prior Functioning  Cognitive/Linguistic Baseline: Within functional limits   Cognition  Overall Cognitive Status: Difficult to assess Arousal/Alertness: Awake/alert Orientation Level: Oriented to person;Disoriented to place;Disoriented to time;Disoriented to situation Attention: Focused Focused Attention: Appears intact    Comprehension  Auditory Comprehension Overall Auditory Comprehension: Impaired Yes/No Questions: Impaired Basic Biographical Questions: 0-25% accurate Commands: Impaired One Step Basic  Commands: 0-24% accurate Visual Recognition/Discrimination Discrimination: Exceptions to [facility] Common Objects: Unable to indentify Reading Comprehension Reading Status: Impaired    Expression Expression Primary Mode of Expression: Verbal Verbal Expression Overall Verbal Expression: Impaired Initiation: No impairment Automatic Speech:  (unable to produce automatic sequences) Level of Generative/Spontaneous Verbalization: Word Repetition: Impaired Level of Impairment: Word level Naming: Impairment Confrontation: Impaired Common Objects: Unable to indentify Convergent: 0-24% accurate Verbal Errors: Perseveration Written Expression Dominant Hand: Right Written Expression: Exceptions to [facility] Trace Ability:  (unable)   Oral / Motor Oral Motor/Sensory Function Overall Oral Motor/Sensory Function:  (decrease CN VII on right) Motor Speech Overall Motor Speech: Impaired Phonation: Normal Motor Planning: Impaired   Richard [name] L. Samson Frederic, [place] CCC/SLP Pager [phone]      Blenda Mounts Laurice 12/11/2014, 4:39 PM

## 2014-12-11 NOTE — Progress Notes (Signed)
Rehab Admissions Coordinator Note:  Patient was screened by Clois Dupes for appropriateness for an Inpatient Acute Rehab Consult per PT recommendation.  At this time, we are recommending Inpatient Rehab consult.  Clois Dupes 12/11/2014, 6:00 PM  I can be reached at [phone].

## 2014-12-11 NOTE — Evaluation (Deleted)
Physical Therapy Evaluation Patient Details Name: Richard [name] MRN: [id number] DOB: 05/30/1945 Today's Date: 12/11/2014   History of Present Illness  Richard [name] is an 69 y.o. male, right handed, with a past medical history significant for DM, stroke without residual deficits, s/p artificial left eye, brought in for evaluation of aphasia, R facial weakness and R sided weakness.  CT/MRI shows L frontal ICH  Clinical Impression  Pt admitted with/for L ica with aphasia and R facial and side weakness.  Pt currently limited functionally due to the problems listed. ( See problems list.)   Pt will benefit from PT to maximize function and safety in order to get ready for next venue listed below.     Follow Up Recommendations CIR    Equipment Recommendations  Other (comment) (TBA)    Recommendations for Other Services Rehab consult     Precautions / Restrictions Precautions Precautions: Fall      Mobility  Bed Mobility Overal bed mobility: Needs Assistance Bed Mobility: Supine to Sit;Sit to Supine     Supine to sit: Min assist Sit to supine: Min assist   General bed mobility comments: min physical cues for direction and min truncal assist  Transfers Overall transfer level: Needs assistance Equipment used: 1 person hand held assist Transfers: Sit to/from Stand Sit to Stand: Mod assist;+2 safety/equipment;+2 physical assistance         General transfer comment: physical assist for direction due to no apparent verbal comprehension.  Steady assist  Ambulation/Gait Ambulation/Gait assistance: Mod assist;+2 safety/equipment;+2 physical assistance Ambulation Distance (Feet): 50 Feet Assistive device: 1 person hand held assist;2 person hand held assist Gait Pattern/deviations: Step-through pattern;Scissoring;Ataxic;Wide base of support Gait velocity: slow and deliberate Gait velocity interpretation: Below normal speed for age/gender General Gait Details: pt with staggered,  scissored wide BOS gait, Pt with R gaze preference, only looking R if head physically turned right.  Pt hitting obstacles on the right and unable to problem solve how to get around the obstacle without physical direction.  Stairs            Wheelchair Mobility    Modified Rankin (Stroke Patients Only) Modified Rankin (Stroke Patients Only) Pre-Morbid Rankin Score: No symptoms Modified Rankin: Moderately severe disability     Balance                                             Pertinent Vitals/Pain Pain Assessment: Faces Faces Pain Scale: No hurt    Home Living                   Additional Comments: Unable to get history from pt, no family present    Prior Function           Comments: no family present to ascertain PLOF     Hand Dominance   Dominant Hand: Right    Extremity/Trunk Assessment   Upper Extremity Assessment:  (holds R arm against his side, moves grossly)           Lower Extremity Assessment: LLE deficits/detail;RLE deficits/detail RLE Deficits / Details: moving against gravity, but not coordinated.  Unable to MMT due to unable to follow direction/  Gross extension 4+/5 LLE Deficits / Details: appears WFL without formal MMT     Communication   Communication: Expressive difficulties;Receptive difficulties  Cognition Arousal/Alertness: Awake/alert Behavior During Therapy: Flat affect Overall Cognitive Status:  Difficult to assess                      General Comments      Exercises        Assessment/Plan    PT Assessment Patient needs continued PT services  PT Diagnosis Difficulty walking;Generalized weakness;Other (comment) (hemiparesis)   PT Problem List Decreased strength;Decreased activity tolerance;Decreased balance;Decreased mobility;Decreased coordination;Decreased safety awareness;Decreased knowledge of precautions  PT Treatment Interventions DME instruction;Gait training;Functional  mobility training;Therapeutic activities;Balance training;Neuromuscular re-education;Patient/family education   PT Goals (Current goals can be found in the Care Plan section) Acute Rehab PT Goals PT Goal Formulation: Patient unable to participate in goal setting Time For Goal Achievement: 12/25/14 Potential to Achieve Goals: Good    Frequency Min 3X/week   Barriers to discharge        Co-evaluation               End of Session   Activity Tolerance: Patient tolerated treatment well Patient left: in bed;with call bell/phone within reach;with bed alarm set;with nursing/sitter in room Nurse Communication: Mobility status         Time: 5409-8119 PT Time Calculation (min) (ACUTE ONLY): 20 min   Charges:   PT Evaluation $Initial PT Evaluation Tier I: 1 Procedure     PT G Codes:        Richard [name], Richard [name] 12/11/2014, 5:53 PM 12/11/2014  Bendena Bing, PT [phone]  (pager)

## 2014-12-11 NOTE — Progress Notes (Signed)
  Echocardiogram 2D Echocardiogram has been performed.  Richard [name] 12/11/2014, 8:51 AM

## 2014-12-11 NOTE — Progress Notes (Signed)
OT Cancellation Note  Patient Details Name: Richard [name] MRN: [id number] DOB: December 07, 1945   Cancelled Treatment:    Reason Eval/Treat Not Completed: Patient not medically ready Pt on bedrest. Please update activity orders when appropriate to begin therapy. Thanks [facility] Francisca Harbuck, OTR/L  [phone] 12/11/2014 12/11/2014, 7:54 AM

## 2014-12-11 NOTE — Care Management Note (Signed)
Case Management Note  Patient Details  Name: Richrd Kuzniar MRN: [id number] Date of Birth: Jan 27, 1946  Subjective/Objective:  Pt admitted on 12/10/14 with ICH.  PTA, pt resides at home with wife and son.                    Action/Plan: Will follow for discharge planning as pt progresses.    Expected Discharge Date:                  Expected Discharge Plan:  IP Rehab Facility  In-House Referral:     Discharge planning Services  CM Consult  Post Acute Care Choice:    Choice offered to:     DME Arranged:    DME Agency:     HH Arranged:    HH Agency:     Status of Service:  In process, will continue to follow  Medicare Important Message Given:    Date Medicare IM Given:    Medicare IM give by:    Date Additional Medicare IM Given:    Additional Medicare Important Message give by:     If discussed at Long Length of Stay Meetings, dates discussed:    Additional Comments:  Quintella Baton, RN, BSN  Trauma/Neuro ICU Case Manager [phone]

## 2014-12-11 NOTE — Progress Notes (Signed)
STROKE TEAM PROGRESS NOTE  HPI Richard [name] is an 69 y.o. male, right handed, with a past medical history significant for DM, stroke without residual deficits, s/p artificial left eye, brought in for evaluation of aphasia and right facial weakness. Patient son is at the bedside and indicated that his father was last seen normal around 11 pm last night, but when he woke up this morning he was not able to speak fluently and he noticed weakness of the right side of his face, and thus he became concerned and brought patient to the ED. Upon arrival to the ED, patient was described as awake and alert but aphasic with right face weakness and SBP>200. Urgent CT brain was obtained, personally reviewed, and showed evidence of a lobar left frontal ICH measuring 3.5 x 2.5 cm transverse, with an estimated ICH volume of approximately of 17.5 ml. There is mild surrounding vasogenic edema but no IV extension or hydrocephalus.  Currently, patient is able to follow commands but has expressive aphasia. Complains of mild HA but denies vertigo, double vision, focal numbness, difficulty swallowing. No recent head trauma, no use of anticoagulants or antiplatelets. Son reports no concerns regarding his father cognitive skills. Started on nicardipine drip as his BP remains >190. Serologies reviewed: PTT 27, INR 1.02, platelets 238.  LSN: 11 pm on 12/09/14 tPA Given: no, ICH ICH volume: 17.5 ml ICH score: 0   SUBJECTIVE (INTERVAL HISTORY) No family is at the bedside.  Overall he feels his condition is stable. Repeat CT head stable hematoma. He was reported as heavy drinker, will put on CIWA protocol.   OBJECTIVE Temp:  [97.7 F (36.5 C)-99.2 F (37.3 C)] 99.2 F (37.3 C) (09/15 0400) Pulse Rate:  [71-112] 83 (09/15 0624) Cardiac Rhythm:  [-]  Resp:  [9-26] 21 (09/15 0624) BP: (123-232)/(66-196) 151/82 mmHg (09/15 0624) SpO2:  [95 %-100 %] 97 % (09/15 0624) Weight:  [102.059 kg (225 lb)] 102.059 kg (225 lb) (09/14  0926)  CBC:  Recent Labs Lab 12/10/14 0920 12/10/14 0928  WBC 8.6  --   NEUTROABS 5.2  --   HGB 14.2 15.3  HCT 41.2 45.0  MCV 89.6  --   PLT 238  --     Basic Metabolic Panel:  Recent Labs Lab 12/10/14 0920 12/10/14 0928  NA 136 137  K 3.5 3.5  CL 99* 98*  CO2 28  --   GLUCOSE 225* 227*  BUN 12 11  CREATININE 1.13 1.10  CALCIUM 8.8*  --     Lipid Panel: No results found for: CHOL, TRIG, HDL, CHOLHDL, VLDL, LDLCALC HgbA1c: No results found for: HGBA1C Urine Drug Screen: No results found for: LABOPIA, COCAINSCRNUR, LABBENZ, AMPHETMU, THCU, LABBARB    IMAGING  Ct Head Wo Contrast 12/10/2014    1. Acute left frontal hemorrhage with surrounding edema, likely representing atypical hemorrhagic stroke. A hemorrhagic mass is considered less likely. CT or MR follow up suggested.  2. Confluent low-density in the periventricular and subcortical white matter consistent with chronic small vessel ischemic changes.   12/11/2014   IMPRESSION: Unchanged left frontal parenchymal hematoma, approximately 12 cc volume.    Dg Chest Port 1 View  12/11/2014   IMPRESSION: Low lung volumes with no radiographic evidence of acute cardiopulmonary disease  Mr [name] Wo Contrast 12/10/2014    Acute LEFT posterior frontal intracerebral hemorrhage 30 x 24 x 32 mm, with estimated volume 12 mL.  Mild surrounding edema. This is favored to represent a lobar hypertensive bleed.  Cerebral amyloid angiopathy is not excluded. Doubt embolic infarction or hemorrhagic tumor.  No sign suggestive of vascular malformation or cortical venous thrombosis.   Moderate atrophy with extensive white matter signal abnormality, likely chronic microvascular ischemic change.     2D echo - - Left ventricle: The cavity size was normal. Wall thickness was increased in a pattern of mild LVH. Systolic function was normal. The estimated ejection fraction was in the range of 60% to 65%. Wall motion was normal; there  were no regional wall motion abnormalities. Doppler parameters are consistent with abnormal left ventricular relaxation (grade 1 diastolic dysfunction). The E/e&' ratio is <8, suggesting normal LV filling pressure. - Left atrium: The atrium was normal in size. - Right atrium: The atrium was mildly dilated.  Impressions: - LVEF 60-65%, mild LVH, normal wall motion, diastolic dysfunction,normal LV filling pressure, mild RAE.  CUS - pending   PHYSICAL EXAM  Temp:  [98.4 F (36.9 C)-99.2 F (37.3 C)] 98.9 F (37.2 C) (09/15 1200) Pulse Rate:  [74-105] 77 (09/15 1400) Resp:  [10-26] 16 (09/15 1400) BP: (122-171)/(66-145) 152/81 mmHg (09/15 1400) SpO2:  [80 %-100 %] 80 % (09/15 1400)  General - Well nourished, well developed, in no apparent distress.  Ophthalmologic - Fundi not visualized due to noncooperation.  Cardiovascular - Regular rate and rhythm with no murmur.  Mental Status -  Awake, alert, not in acute distress. Language exam showed global aphasia, only answer with "yes", not following commands.  Cranial Nerves II - XII - II - blinking to both side on visual threat. Left eye artificial III, IV, VI - Extraocular movements showed left gaze preference but able to cross midline. V - Facial sensation not able to test VII - right facial droop. VIII - no nystagmus X - not able to test, but positive cough and gag. XI - not cooperative on exam. XII - tongue in middle position inside mouth.  Motor Strength - The patient's strength was 0/5 RUE and 3/5 RLE, but 5/5 LUE and LLE.  Bulk was normal and fasciculations were absent.   Motor Tone - Muscle tone was assessed at the neck and appendages and was normal.  Reflexes - The patient's reflexes were 1+ in all extremities and he had no pathological reflexes.  Sensory - Light touch, temperature/pinprick were assessed and were symmetrical.    Coordination - The patient had normal movements in left hand and foot with no  ataxia or dysmetria.  Tremor was absent.  Gait and Station - not tested due to weakness.  ASSESSMENT/PLAN [name] is a 69 y.o. male with history of DM, previous stroke without residual deficits, s/p artificial left eye, presenting with aphasia and right facial weakness. He did not receive IV t-PA due to ICH.  Stroke: lobar left frontal ICH, likely hypertensive, although CAA not excluded.  Resultant  Aphasia, right hemiparesis.  MRI - Acute LEFT posterior frontal ICH, 12 mL.   MRA - no evidence of AVM or CAA.  Repeat CT showed stable hematoma  Carotid Doppler - pending  2D Echo - unremarkable  LDL 102  HgbA1c pending  VTE prophylaxis - SCDs   Diet Carb Modified Fluid consistency:: Thin; Room service appropriate?: Yes  no antithrombotic prior to admission, now on no antithrombotic secondary to left frontal ICH.  Ongoing aggressive stroke risk factor management  Therapy recommendations: Pending  Disposition: Pending  Hypertension  Blood pressure mildly high at times.  Home meds - coreg, lisinopril, benicar-HTCZ  Put back on  coreg and lisnopril  BP goal < 160  Hyperlipidemia  Home meds:  resumed in hospital  LDL 102, goal < 70  Add lipitor 10mg .  Continue statin at discharge  Diabetes  HgbA1c pending, goal < 7.0  Uncontrolled  SSI  Home meds include insulin subq  CBG monitoring  Alcohol abuse  Heavy drinker as per report  CIWA protocol  B1, FA, multivitamin  Other Stroke Risk Factors  Advanced age  Hx stroke/TIA   Other Active Problems  Low B12 - on supplement  Left eye artificial  Hyponatremia - monitoring, improving  Hospital day # 1  This patient is critically ill due to ICH, HTN and heavy alcohol drinker and at significant risk of neurological worsening, death form recurrent bleeding, cerebral edema and brain herniation. This patient's care requires constant monitoring of vital signs, hemodynamics, respiratory and  cardiac monitoring, review of multiple databases, neurological assessment, discussion with family, other specialists and medical decision making of high complexity. I spent 40 minutes of neurocritical care time in the care of this patient.   Marvel Plan, MD PhD Stroke Neurology 12/11/2014 4:16 PM    To contact Stroke Continuity provider, please refer to [email or web address]. After hours, contact General Neurology

## 2014-12-11 NOTE — Progress Notes (Signed)
Inpatient Diabetes Program Recommendations  AACE/ADA: New Consensus Statement on Inpatient Glycemic Control (2015)  Target Ranges:  Prepandial:   less than 140 mg/dL      Peak postprandial:   less than 180 mg/dL (1-2 hours)      Critically ill patients:  140 - 180 mg/dL   Review of Glycemic Control  Diabetes history: DM 2 Outpatient Diabetes medications: NPH 40 units QAM, 30-35 units QPM, Novolog 100 units TID with meals listed in med rec Current orders for Inpatient glycemic control: Novolog moderate scale TID  Inpatient Diabetes Program Recommendations:  Insulin - Basal: Patient consistently in the 200's. Patient takes Novolin N (NPH/basal) 40 units QAM, 30-35 units QPM. Please consider ordering NPH 10 units BID.  Thanks,  Christena Deem RN, MSN, [facility] Inpatient Diabetes Coordinator Team Pager [phone] (8a-5p)

## 2014-12-12 ENCOUNTER — Inpatient Hospital Stay (HOSPITAL_COMMUNITY): Payer: Medicare HMO

## 2014-12-12 DIAGNOSIS — J9602 Acute respiratory failure with hypercapnia: Secondary | ICD-10-CM

## 2014-12-12 DIAGNOSIS — I619 Nontraumatic intracerebral hemorrhage, unspecified: Secondary | ICD-10-CM | POA: Insufficient documentation

## 2014-12-12 DIAGNOSIS — G8190 Hemiplegia, unspecified affecting unspecified side: Secondary | ICD-10-CM

## 2014-12-12 DIAGNOSIS — I611 Nontraumatic intracerebral hemorrhage in hemisphere, cortical: Principal | ICD-10-CM

## 2014-12-12 DIAGNOSIS — Z978 Presence of other specified devices: Secondary | ICD-10-CM | POA: Insufficient documentation

## 2014-12-12 DIAGNOSIS — E871 Hypo-osmolality and hyponatremia: Secondary | ICD-10-CM

## 2014-12-12 DIAGNOSIS — G9340 Encephalopathy, unspecified: Secondary | ICD-10-CM

## 2014-12-12 DIAGNOSIS — R4701 Aphasia: Secondary | ICD-10-CM

## 2014-12-12 LAB — BASIC METABOLIC PANEL
ANION GAP: 11 (ref 5–15)
Anion gap: 9 (ref 5–15)
Anion gap: 9 (ref 5–15)
BUN: 6 mg/dL (ref 6–20)
BUN: 9 mg/dL (ref 6–20)
BUN: 10 mg/dL (ref 6–20)
CALCIUM: 8.4 mg/dL — AB (ref 8.9–10.3)
CALCIUM: 8.6 mg/dL — AB (ref 8.9–10.3)
CHLORIDE: 97 mmol/L — AB (ref 101–111)
CHLORIDE: 97 mmol/L — AB (ref 101–111)
CO2: 23 mmol/L (ref 22–32)
CO2: 24 mmol/L (ref 22–32)
CO2: 22 mmol/L (ref 22–32)
Calcium: 8.8 mg/dL — ABNORMAL LOW (ref 8.9–10.3)
Chloride: 96 mmol/L — ABNORMAL LOW (ref 101–111)
Creatinine, Ser: 1 mg/dL (ref 0.61–1.24)
Creatinine, Ser: 1.26 mg/dL — ABNORMAL HIGH (ref 0.61–1.24)
Creatinine, Ser: 1.23 mg/dL (ref 0.61–1.24)
GFR calc Af Amer: >60 mL/min (ref >60)
GFR calc Af Amer: >60 mL/min (ref >60)
GFR calc non Af Amer: 57 mL/min — ABNORMAL LOW (ref >60)
GFR calc non Af Amer: 58 mL/min — ABNORMAL LOW (ref >60)
GLUCOSE: 202 mg/dL — AB (ref 65–99)
GLUCOSE: 198 mg/dL — AB (ref 65–99)
Glucose, Bld: 213 mg/dL — ABNORMAL HIGH (ref 65–99)
POTASSIUM: 3.5 mmol/L (ref 3.5–5.1)
POTASSIUM: 3.8 mmol/L (ref 3.5–5.1)
Potassium: 3.9 mmol/L (ref 3.5–5.1)
Sodium: 128 mmol/L — ABNORMAL LOW (ref 135–145)
Sodium: 130 mmol/L — ABNORMAL LOW (ref 135–145)
Sodium: 130 mmol/L — ABNORMAL LOW (ref 135–145)

## 2014-12-12 LAB — BLOOD GAS, ARTERIAL
ACID-BASE DEFICIT: 1.3 mmol/L (ref 0.0–2.0)
BICARBONATE: 23.3 meq/L (ref 20.0–24.0)
Drawn by: 44137
FIO2: 100
LHR: 14 {breaths}/min
O2 SAT: 99.9 %
PATIENT TEMPERATURE: 99.3
PCO2 ART: 42.8 mmHg (ref 35.0–45.0)
PEEP/CPAP: 5 cmH2O
TCO2: 24.6 mmol/L (ref 0–100)
VT: 660 mL
pH, Arterial: 7.357 (ref 7.350–7.450)
pO2, Arterial: 491 mmHg — ABNORMAL HIGH (ref 80.0–100.0)

## 2014-12-12 LAB — CBC
HCT: 38.9 % — ABNORMAL LOW (ref 39.0–52.0)
Hemoglobin: 13 g/dL (ref 13.0–17.0)
MCH: 30 pg (ref 26.0–34.0)
MCHC: 33.4 g/dL (ref 30.0–36.0)
MCV: 89.8 fL (ref 78.0–100.0)
PLATELETS: 210 10*3/uL (ref 150–400)
RBC: 4.33 MIL/uL (ref 4.22–5.81)
RDW: 11.7 % (ref 11.5–15.5)
WBC: 8.5 10*3/uL (ref 4.0–10.5)

## 2014-12-12 LAB — GLUCOSE, CAPILLARY
GLUCOSE-CAPILLARY: 230 mg/dL — AB (ref 65–99)
GLUCOSE-CAPILLARY: 185 mg/dL — AB (ref 65–99)
GLUCOSE-CAPILLARY: 198 mg/dL — AB (ref 65–99)
GLUCOSE-CAPILLARY: 138 mg/dL — AB (ref 65–99)
Glucose-Capillary: 209 mg/dL — ABNORMAL HIGH (ref 65–99)

## 2014-12-12 LAB — SODIUM, URINE, RANDOM: Sodium, Ur: 195 mmol/L

## 2014-12-12 LAB — OSMOLALITY, URINE: OSMOLALITY UR: 739 mosm/kg (ref 390–1090)

## 2014-12-12 LAB — TROPONIN I: Troponin I: <0.03 ng/mL (ref <0.031)

## 2014-12-12 LAB — CREATININE, URINE, RANDOM: Creatinine, Urine: 139.45 mg/dL

## 2014-12-12 LAB — HEMOGLOBIN A1C
Hgb A1c MFr Bld: 7.5 % — ABNORMAL HIGH (ref 4.8–5.6)
Mean Plasma Glucose: 169 mg/dL

## 2014-12-12 MED ORDER — VITAMIN B-1 100 MG PO TABS
100.0000 mg | ORAL_TABLET | Freq: Every day | ORAL | Status: DC
  Administered 2014-12-13 – 2014-12-15 (×3): 100 mg
  Filled 2014-12-12 (×3): qty 1

## 2014-12-12 MED ORDER — FENTANYL CITRATE (PF) 100 MCG/2ML IJ SOLN
INTRAMUSCULAR | Status: AC
  Administered 2014-12-12: 100 ug
  Filled 2014-12-12: qty 4

## 2014-12-12 MED ORDER — SODIUM CHLORIDE 1 G PO TABS
1.0000 g | ORAL_TABLET | Freq: Three times a day (TID) | ORAL | Status: DC
  Filled 2014-12-12 (×4): qty 1

## 2014-12-12 MED ORDER — CARVEDILOL 12.5 MG PO TABS
25.0000 mg | ORAL_TABLET | Freq: Two times a day (BID) | ORAL | Status: DC
  Administered 2014-12-12: 25 mg
  Filled 2014-12-12: qty 2

## 2014-12-12 MED ORDER — ETOMIDATE 2 MG/ML IV SOLN
20.0000 mg | Freq: Once | INTRAVENOUS | Status: AC
  Administered 2014-12-12: 20 mg via INTRAVENOUS

## 2014-12-12 MED ORDER — ANTISEPTIC ORAL RINSE SOLUTION (CORINZ)
7.0000 mL | Freq: Four times a day (QID) | OROMUCOSAL | Status: DC
  Administered 2014-12-12 – 2014-12-15 (×10): 7 mL via OROMUCOSAL

## 2014-12-12 MED ORDER — HEPARIN SODIUM (PORCINE) 5000 UNIT/ML IJ SOLN
5000.0000 [IU] | Freq: Three times a day (TID) | INTRAMUSCULAR | Status: DC
  Administered 2014-12-12 – 2014-12-16 (×13): 5000 [IU] via SUBCUTANEOUS
  Filled 2014-12-12 (×13): qty 1

## 2014-12-12 MED ORDER — SODIUM CHLORIDE 1 G PO TABS
1.0000 g | ORAL_TABLET | Freq: Three times a day (TID) | ORAL | Status: DC
  Administered 2014-12-12 – 2014-12-13 (×4): 1 g
  Filled 2014-12-12 (×8): qty 1

## 2014-12-12 MED ORDER — DEXMEDETOMIDINE HCL IN NACL 200 MCG/50ML IV SOLN
0.4000 ug/kg/h | INTRAVENOUS | Status: DC
  Administered 2014-12-12: 0.4 ug/kg/h via INTRAVENOUS
  Administered 2014-12-12: 0.8 ug/kg/h via INTRAVENOUS
  Administered 2014-12-12: 0.5 ug/kg/h via INTRAVENOUS
  Administered 2014-12-13: 0.8 ug/kg/h via INTRAVENOUS
  Filled 2014-12-12 (×4): qty 50

## 2014-12-12 MED ORDER — FENTANYL CITRATE (PF) 100 MCG/2ML IJ SOLN
50.0000 ug | INTRAMUSCULAR | Status: DC | PRN
  Filled 2014-12-12: qty 2

## 2014-12-12 MED ORDER — ACETAMINOPHEN 325 MG PO TABS
650.0000 mg | ORAL_TABLET | ORAL | Status: DC | PRN
  Administered 2014-12-15: 650 mg
  Filled 2014-12-12: qty 2

## 2014-12-12 MED ORDER — ACETAMINOPHEN 650 MG RE SUPP: 650.0000 mg | RECTAL | Status: DC | PRN

## 2014-12-12 MED ORDER — ATORVASTATIN CALCIUM 10 MG PO TABS
10.0000 mg | ORAL_TABLET | Freq: Every day | ORAL | Status: DC
  Administered 2014-12-12 – 2014-12-15 (×3): 10 mg
  Filled 2014-12-12 (×3): qty 1

## 2014-12-12 MED ORDER — THIAMINE HCL 100 MG/ML IJ SOLN: 100.0000 mg | Freq: Every day | INTRAMUSCULAR | Status: DC

## 2014-12-12 MED ORDER — FOLIC ACID 1 MG PO TABS
1.0000 mg | ORAL_TABLET | Freq: Every day | ORAL | Status: DC
  Administered 2014-12-13 – 2014-12-15 (×3): 1 mg
  Filled 2014-12-12 (×3): qty 1

## 2014-12-12 MED ORDER — VITAMIN B-12 100 MCG PO TABS
100.0000 ug | ORAL_TABLET | Freq: Every day | ORAL | Status: DC
  Administered 2014-12-13 – 2014-12-15 (×3): 100 ug
  Filled 2014-12-12 (×3): qty 1

## 2014-12-12 MED ORDER — PANTOPRAZOLE SODIUM 40 MG IV SOLR
40.0000 mg | Freq: Every day | INTRAVENOUS | Status: DC
  Administered 2014-12-12 – 2014-12-14 (×3): 40 mg via INTRAVENOUS
  Filled 2014-12-12 (×3): qty 40

## 2014-12-12 MED ORDER — DEXMEDETOMIDINE HCL IN NACL 200 MCG/50ML IV SOLN: 0.4000 ug/kg/h | INTRAVENOUS | Status: DC

## 2014-12-12 MED ORDER — FENTANYL CITRATE (PF) 100 MCG/2ML IJ SOLN
50.0000 ug | INTRAMUSCULAR | Status: DC | PRN
  Administered 2014-12-12 (×2): 50 ug via INTRAVENOUS
  Filled 2014-12-12: qty 2

## 2014-12-12 MED ORDER — CHLORHEXIDINE GLUCONATE 0.12% ORAL RINSE (MEDLINE KIT)
15.0000 mL | Freq: Two times a day (BID) | OROMUCOSAL | Status: DC
  Administered 2014-12-12 – 2014-12-15 (×6): 15 mL via OROMUCOSAL

## 2014-12-12 MED ORDER — CLEVIDIPINE BUTYRATE 0.5 MG/ML IV EMUL
0.0000 mg/h | INTRAVENOUS | Status: DC
  Administered 2014-12-12 (×2): 1 mg/h via INTRAVENOUS
  Filled 2014-12-12 (×2): qty 50

## 2014-12-12 MED ORDER — ADULT MULTIVITAMIN W/MINERALS CH
1.0000 | ORAL_TABLET | Freq: Every day | ORAL | Status: DC
  Administered 2014-12-13 – 2014-12-15 (×3): 1
  Filled 2014-12-12 (×3): qty 1

## 2014-12-12 MED ORDER — INSULIN ASPART 100 UNIT/ML ~~LOC~~ SOLN
0.0000 [IU] | SUBCUTANEOUS | Status: DC
  Administered 2014-12-12 – 2014-12-13 (×4): 3 [IU] via SUBCUTANEOUS
  Administered 2014-12-13 (×2): 2 [IU] via SUBCUTANEOUS
  Administered 2014-12-13 (×2): 3 [IU] via SUBCUTANEOUS
  Administered 2014-12-14: 2 [IU] via SUBCUTANEOUS
  Administered 2014-12-14: 3 [IU] via SUBCUTANEOUS
  Administered 2014-12-14 – 2014-12-15 (×6): 2 [IU] via SUBCUTANEOUS

## 2014-12-12 MED ORDER — DEXMEDETOMIDINE HCL IN NACL 200 MCG/50ML IV SOLN: 0.2000 ug/kg/h | INTRAVENOUS | Status: DC

## 2014-12-12 MED ORDER — ETOMIDATE 2 MG/ML IV SOLN: 20.0000 mg/kg | Freq: Once | INTRAVENOUS | Status: DC

## 2014-12-12 MED ORDER — MIDAZOLAM HCL 2 MG/2ML IJ SOLN
INTRAMUSCULAR | Status: AC
  Administered 2014-12-12: 16:00:00
  Filled 2014-12-12: qty 2

## 2014-12-12 MED ORDER — ROCURONIUM BROMIDE 50 MG/5ML IV SOLN
50.0000 mg | Freq: Once | INTRAVENOUS | Status: AC
  Administered 2014-12-12: 50 mg via INTRAVENOUS

## 2014-12-12 MED ORDER — SENNOSIDES-DOCUSATE SODIUM 8.6-50 MG PO TABS
1.0000 | ORAL_TABLET | Freq: Two times a day (BID) | ORAL | Status: DC
  Administered 2014-12-12 – 2014-12-15 (×3): 1
  Filled 2014-12-12 (×3): qty 1

## 2014-12-12 MED ORDER — HYDROCHLOROTHIAZIDE 25 MG PO TABS: 25.0000 mg | ORAL_TABLET | Freq: Every day | ORAL | Status: DC

## 2014-12-12 NOTE — Progress Notes (Signed)
Called Dr. Vassie Loll to assess patient as he was having snoring respirations, became obtunded, hypotensive, and oxygen saturations dropped to 85% after working with PT/OT. Dr. Roda Shutters called and made aware of patient change in mental status. Dr. Kendrick Fries at bedside and Anders Simmonds intubated the patient.

## 2014-12-12 NOTE — Progress Notes (Signed)
*  PRELIMINARY RESULTS* Vascular Ultrasound Carotid Duplex (Doppler) has been completed.   Study was technically difficult due to patient's inability to cooperate, patient position, and movement. Findings suggest 1-39% internal carotid artery stenosis of visualized segments bilaterally. Vertebral arteries are patent with antegrade flow.  12/12/2014 9:48 AM Gertie Fey, RVT, RDCS, RDMS

## 2014-12-12 NOTE — Procedures (Signed)
Intubation Procedure Note Richard [name] [id number] [birth year]  Procedure: Intubation Indications: Airway protection and maintenance  Procedure Details Consent: Unable to obtain consent because of emergent medical necessity. Time Out: Verified patient identification, verified procedure, site/side was marked, verified correct patient position, special equipment/implants available, medications/allergies/relevent history reviewed, required imaging and test results available.  Performed  Maximum sterile technique was used including antiseptics, gloves, hand hygiene and mask.  MAC #4 glide 8 ETT    Evaluation Hemodynamic Status: BP stable throughout; O2 sats: stable throughout Patient's Current Condition: stable Complications: No apparent complications Patient did tolerate procedure well. Chest X-ray ordered to verify placement.  CXR: pending.   BABCOCK,PETE 12/12/2014  Attending:  I was present for and supervised the procedure  Heber Orrick, MD [facility] [facility] Pager: [phone] Cell: [phone] After 3pm or if no response, call [phone]

## 2014-12-12 NOTE — Procedures (Signed)
Intubation Procedure Note Richard [name] [id number] 06/22/1945  Procedure: Intubation Indications: Airway protection and maintenance  Procedure Details Consent: Unable to obtain consent because of emergent medical necessity. Time Out: Verified patient identification, verified procedure, site/side was marked, verified correct patient position, special equipment/implants available, medications/allergies/relevent history reviewed, required imaging and test results available.  Performed  Maximum sterile technique was used including cap, gloves, gown, hand hygiene and mask.  MAC and 4    Evaluation Hemodynamic Status: BP stable throughout; O2 sats: stable throughout Patient's Current Condition: stable Complications: No apparent complications Patient did tolerate procedure well. Chest X-ray ordered to verify placement.  CXR: pending.  Pt intubated using glidescope with #4 blade on 1st attempt with 8.0 ett secured at 24 cm at the lip to the right. Pt with bilateral BS, positive color change on etco2, direct vizualization. CXR pending. RT will continue to monitor.    Carolan Shiver 12/12/2014

## 2014-12-12 NOTE — Progress Notes (Signed)
EEG completed; results pending.    

## 2014-12-12 NOTE — Progress Notes (Signed)
Inpatient Diabetes Program Recommendations  AACE/ADA: New Consensus Statement on Inpatient Glycemic Control (2015)  Target Ranges:  Prepandial:   less than 140 mg/dL      Peak postprandial:   less than 180 mg/dL (1-2 hours)      Critically ill patients:  140 - 180 mg/dL   Results for CHEZ, BULNES (MRN [id number]) as of 12/12/2014 09:30  Ref. Range 12/11/2014 07:34 12/11/2014 11:29 12/11/2014 16:04 12/11/2014 23:25 12/12/2014 07:56  Glucose-Capillary Latest Ref Range: 65-99 mg/dL 409 (H) 811 (H) 914 (H) 209 (H) 230 (H)   Review of Glycemic Control: Hyperglycemia consistently > 200 mg/dl  Diabetes history: DM 2 Outpatient Diabetes medications: NPH 40 units QAM, 30-35 units QPM, Novolog 100 units TID with meals listed in med rec Current orders for Inpatient glycemic control: Novolog moderate scale TID  Inpatient Diabetes Program Recommendations: Insulin - Basal: Patient takes Novolin N (NPH/basal) 40 units QAM, 30-35 units QPM. Please consider ordering NPH 10 units BID.  Thanks,  Christena Deem RN, MSN, [facility] Inpatient Diabetes Coordinator Team Pager [phone] (8a-5p)

## 2014-12-12 NOTE — Progress Notes (Signed)
OT Evaluation  Pt seen as cotreat with PT. Pt appeared minimally restless at beginning of session supine in bed with eyes closed. Mitts on B hands. Attempting to answer questions. Total A +2 to EOB. Pt initially attempting to communicate verbally. Pt became less responsive. BP 54/38. Reclined to supine - BP 93/64; 110/61. PT notified nsg of BP and change in status from PT session yesterday. Will await further orders if/when pt becomes appropriae for therapy.     12/12/14 1500  OT Visit Information  Last OT Received On 12/12/14  Assistance Needed +2  PT/OT/SLP Co-Evaluation/Treatment Yes  Reason for Co-Treatment Complexity of the patient's impairments (multi-system involvement);Necessary to address cognition/behavior during functional activity;For patient/therapist safety  OT goals addressed during session ADL's and self-care;Strengthening/ROM  History of Present Illness Richard [name] is an 69 y.o. male, right handed, with a past medical history significant for DM, stroke without residual deficits, s/p artificial left eye, brought in for evaluation of aphasia, R fackal weakness and R sided weakness.  CT/MRI shows L frontal ICH  Precautions  Precautions Fall  Home Living  Family/patient expects to be discharged to: Unsure  Prior Function  Level of Independence (no family available to assess)  Communication  Communication Receptive difficulties;Expressive difficulties  Pain Assessment  Pain Assessment Faces  Faces Pain Scale 0  Cognition  Arousal/Alertness Lethargic  Behavior During Therapy Flat affect  Overall Cognitive Status Difficult to assess  Difficult to assess due to Impaired communication  Upper Extremity Assessment  Upper Extremity Assessment RUE deficits/detail;LUE deficits/detail  RUE Deficits / Details moving spontaneously but not functionally following commands. will further assess  RUE Coordination decreased fine motor;decreased gross motor  LUE Deficits / Details moving  spontaneously but not functionally; not following commands  LUE Coordination decreased fine motor;decreased gross motor  Lower Extremity Assessment  Lower Extremity Assessment Defer to PT evaluation  Cervical / Trunk Assessment  Cervical / Trunk Assessment Kyphotic  ADL  Overall ADL's  Needs assistance/impaired  General ADL Comments total Assist  Vision- Assessment  Additional Comments unable to assess  Bed Mobility  Overal bed mobility Needs Assistance;+2 for physical assistance  Bed Mobility Supine to Sit;Sit to Supine  Supine to sit Total assist;+2 for physical assistance  Sit to supine Total assist;+2 for physical assistance  Transfers  General transfer comment unable. Pt became unresponsive  Balance  Overall balance assessment Needs assistance  Sitting balance-Leahy Scale Zero  OT - End of Session  Activity Tolerance Patient limited by lethargy;Treatment limited secondary to medical complications (Comment)  Patient left in bed;with call bell/phone within reach;with bed alarm set  Nurse Communication Mobility status;Other (comment) (concern over low BP/change in status)  OT Assessment  OT Therapy Diagnosis  Generalized weakness;Cognitive deficits;Disturbance of vision;Altered mental status  OT Recommendation/Assessment (change in medical status during assessment)  OT Problem List Decreased strength;Decreased range of motion;Decreased activity tolerance;Impaired balance (sitting and/or standing);Decreased coordination;Impaired vision/perception;Decreased cognition;Decreased safety awareness;Decreased knowledge of use of DME or AE;Decreased knowledge of precautions;Cardiopulmonary status limiting activity;Impaired sensation;Impaired tone;Impaired UE functional use  Barriers to Discharge Other (comment) (unsure)  OT Recommendation  Follow Up Recommendations SNF;Supervision/Assistance - 24 hour  OT Equipment Other (comment) (TBA)  Individuals Consulted  Consulted and Agree with  Results and Recommendations Other (comment) (goals not set at this time)  Acute Rehab OT Goals  Patient Stated Goal pt unable to participate in goals  OT Time Calculation  OT Start Time (ACUTE ONLY) 1411  OT Stop Time (ACUTE ONLY) 1438  OT Time  Calculation (min) 27 min  OT General Charges  $OT Visit 1 Procedure  OT Evaluation  $Initial OT Evaluation Tier I 1 Procedure  [facility], OTR/L  [phone] 12/12/2014

## 2014-12-12 NOTE — Progress Notes (Signed)
STROKE TEAM PROGRESS NOTE   SUBJECTIVE (INTERVAL HISTORY) No family is at the bedside. Pt difficult to arouse this am. He received ativan at 1am and 6am for likely agitation from alcohol withdraw. Na 128 this am. NS discontinued and check urine sodium and osmolality. Stat CT head to rule out re-bleeding as his BP was trending up to 180s. Put on cleviprix drip.    OBJECTIVE Temp:  [98 F (36.7 C)-100.3 F (37.9 C)] 98 F (36.7 C) (09/16 0800) Pulse Rate:  [66-88] 82 (09/16 1000) Cardiac Rhythm:  [-]  Resp:  [12-23] 13 (09/16 1000) BP: (132-181)/(70-112) 158/91 mmHg (09/16 1000) SpO2:  [80 %-100 %] 100 % (09/16 1000)  CBC:  Recent Labs Lab 12/10/14 0920  12/11/14 0725 12/12/14 0236  WBC 8.6  --  9.0 8.5  NEUTROABS 5.2  --   --   --   HGB 14.2  < > 13.4 13.0  HCT 41.2  < > 38.9* 38.9*  MCV 89.6  --  89.0 89.8  PLT 238  --  221 210  < > = values in this interval not displayed.  Basic Metabolic Panel:   Recent Labs Lab 12/11/14 0725 12/11/14 1630 12/12/14 0236  NA 131* 132* 128*  K 3.1*  --  3.9  CL 95*  --  96*  CO2 24  --  23  GLUCOSE 271*  --  202*  BUN <5*  --  6  CREATININE 0.87  --  1.00  CALCIUM 8.5*  --  8.4*    Lipid Panel:     Component Value Date/Time   CHOL 187 12/11/2014 0725   TRIG 218* 12/11/2014 0725   HDL 41 12/11/2014 0725   CHOLHDL 4.6 12/11/2014 0725   VLDL 44* 12/11/2014 0725   LDLCALC 102* 12/11/2014 0725   HgbA1c:  Lab Results  Component Value Date   HGBA1C 7.5* 12/11/2014   Urine Drug Screen:     Component Value Date/Time   LABOPIA NONE DETECTED 12/11/2014 1307   COCAINSCRNUR NONE DETECTED 12/11/2014 1307   LABBENZ NONE DETECTED 12/11/2014 1307   AMPHETMU NONE DETECTED 12/11/2014 1307   THCU NONE DETECTED 12/11/2014 1307   LABBARB NONE DETECTED 12/11/2014 1307      IMAGING I have personally reviewed the radiological images below and agree with the radiology interpretations.  Ct Head Wo Contrast 12/10/2014    1. Acute  left frontal hemorrhage with surrounding edema, likely representing atypical hemorrhagic stroke. A hemorrhagic mass is considered less likely. CT or MR follow up suggested.  2. Confluent low-density in the periventricular and subcortical white matter consistent with chronic small vessel ischemic changes.   12/11/2014   IMPRESSION: Unchanged left frontal parenchymal hematoma, approximately 12 cc volume.    12/12/2014    IMPRESSION: Stable LEFT posterior frontal intracerebral hematoma, without significant extension or worsening midline shift. Chronic changes also stable.     Dg Chest Port 1 View  12/11/2014   IMPRESSION: Low lung volumes with no radiographic evidence of acute cardiopulmonary disease  Mr [name] Wo Contrast 12/10/2014    Acute LEFT posterior frontal intracerebral hemorrhage 30 x 24 x 32 mm, with estimated volume 12 mL.  Mild surrounding edema. This is favored to represent a lobar hypertensive bleed.  Cerebral amyloid angiopathy is not excluded. Doubt embolic infarction or hemorrhagic tumor.  No sign suggestive of vascular malformation or cortical venous thrombosis.   Moderate atrophy with extensive white matter signal abnormality, likely chronic microvascular ischemic change.  2D echo - - Left ventricle: The cavity size was normal. Wall thickness was increased in a pattern of mild LVH. Systolic function was normal. The estimated ejection fraction was in the range of 60% to 65%. Wall motion was normal; there were no regional wall motion abnormalities. Doppler parameters are consistent with abnormal left ventricular relaxation (grade 1 diastolic dysfunction). The E/e&' ratio is <8, suggesting normal LV filling pressure. - Left atrium: The atrium was normal in size. - Right atrium: The atrium was mildly dilated.  Impressions: - LVEF 60-65%, mild LVH, normal wall motion, diastolic dysfunction,normal LV filling pressure, mild RAE.  CUS - Findings suggest 1-39%  internal carotid artery stenosis of visualized segments bilaterally. Vertebral arteries are patent with antegrade flow.   PHYSICAL EXAM  Temp:  [98 F (36.7 C)-100.3 F (37.9 C)] 98 F (36.7 C) (09/16 0800) Pulse Rate:  [66-88] 82 (09/16 1000) Resp:  [12-23] 13 (09/16 1000) BP: (132-181)/(70-112) 158/91 mmHg (09/16 1000) SpO2:  [80 %-100 %] 100 % (09/16 1000)  General - Well nourished, well developed, in no apparent distress.  Ophthalmologic - Fundi not visualized due to noncooperation.  Cardiovascular - Regular rate and rhythm with no murmur.  Mental Status -  Lethargic, drowsy sleepy, intermittent agitation. Language exam not cooperative, nonverbal and not following commands.  Cranial Nerves II - XII - II - right eye not open spontaneously. Left eye artificial III, IV, VI - doll's eye present. V - Facial sensation not able to test VII - right facial droop. VIII - no nystagmus X - not able to test, but positive cough and gag. XI - not cooperative on exam. XII - tongue in middle position inside mouth.  Motor Strength - The patient's strength was 0/5 RUE and 3+/5 RLE, but LUE and LLE spontaneous movement.  Bulk was normal and fasciculations were absent.   Motor Tone - Muscle tone was assessed at the neck and appendages and was normal.  Reflexes - The patient's reflexes were 1+ in all extremities and he had no pathological reflexes.  Sensory - Light touch, temperature/pinprick were not able to be assessed.    Coordination - not cooperative on exam.  Tremor was absent.  Gait and Station - not tested due to weakness and mental status.  ASSESSMENT/PLAN Mr. Quantavius Humm is a 69 y.o. male with history of DM, previous stroke without residual deficits, s/p artificial left eye, presenting with aphasia and right facial weakness. He did not receive IV t-PA due to ICH.  Stroke: lobar left frontal ICH, likely hypertensive, although CAA not excluded.  Resultant  Aphasia, right  hemiparesis.  MRI - Acute LEFT posterior frontal ICH, 12 mL.   MRA - no evidence of AVM or CAA.  Repeat CT showed stable hematoma  Carotid Doppler - unremarkable  2D Echo - unremarkable  LDL 102  HgbA1c 7.5  VTE prophylaxis - SCDs  Diet Carb Modified Fluid consistency:: Thin; Room service appropriate?: Yes  no antithrombotic prior to admission, now on no antithrombotic secondary to left frontal ICH.  Ongoing aggressive stroke risk factor management  Therapy recommendations: Pending  Disposition: Pending  Hyponatremia  On admission sodium [phone]  Na 128 today  NS discontinued  Urine Na 195, urine osmolarity 739  Concerning for SIADH vs CSW  CCM on board  Alcohol withdrawal  On CIWA protocol  Received Ativan overnight, likely the cause of drowsiness this morning  CCM on board  Put on Precedex  On B1, FA, multivitamin  Hypertension, accelerated  Blood pressure high at times.  Home meds - coreg, lisinopril, benicar-HTCZ  On cleviprix drip for BP control as pt not able to have po now.  BP goal < 160  Hyperlipidemia  Home meds:  resumed in hospital  LDL 102, goal < 70  On lipitor 10mg .  Continue statin at discharge  Diabetes  HgbA1c 7.5, goal < 7.0  Uncontrolled  SSI  Home meds include insulin subq  CBG monitoring  Other Stroke Risk Factors  Advanced age  Hx stroke/TIA   Other Active Problems  Low B12 - on supplement  Left eye artificial  Hospital day # 2  This patient is critically ill due to ICH, HTN, hyponatremia and heavy alcohol drinker and at significant risk of neurological worsening, death form recurrent bleeding, cerebral edema and brain herniation, alcohol withdrawal seizure, DT. This patient's care requires constant monitoring of vital signs, hemodynamics, respiratory and cardiac monitoring, review of multiple databases, neurological assessment, discussion with family, other specialists and medical decision  making of high complexity. I spent 45 minutes of neurocritical care time in the care of this patient.   Marvel Plan, MD PhD Stroke Neurology 12/12/2014 10:29 AM    To contact Stroke Continuity [facility] [place], please refer to [email or web address]. After hours, contact General Neurology

## 2014-12-12 NOTE — Progress Notes (Signed)
[facility]  Subjective: Called emergently to the bedside for inability to protect airway and encephalopathy  Objective Filed Vitals:   12/12/14 1445 12/12/14 1500 12/12/14 1515 12/12/14 1617  BP: 95/51 90/67 106/65   Pulse: 76 73 72   Temp:    99.3 F (37.4 C)  TempSrc:    Axillary  Resp: Height:      Weight:      SpO2: 100% 98% 100%     Gen: obtunded HENT: OP clear, TM's clear, neck supple PULM: CTA B, snoring CV: RRR, no mgr, trace edema GI: BS+, soft, nontender Derm: no cyanosis or rash Neuro: Moan to sternal rub, no purposeful movements   Head CT 12/12/2014 shows stable left posterior frontal intracerebral hematoma without significant extension or worsening midline shift chronic changes are stable  Impression/plan  Worsening acute encephalopathy: Etiology at this point not clear, when symptoms were worsening his CT scan was performed and did not show acute change in the bleed or midline shift. There was concern for alcohol withdrawal. He has not received sedating medications in over 2 hours at this point. While Ativan may have contributed, it's unlikely that the Precedex caused this as it was used at a very low dose and held for over 45 minutes before our exam. Case was discussed with Dr. Roda Shutters, we will check for seizure activity with a stat EEG  Acute respiratory failure due to inability to protect airway: He required emergent intubation, see note Full vent support orders written Intermittent fentanyl used for sedation  Rest of care per primary  Additional critical care time by me this afternoon 60 minutes.  Heber Vandalia, MD [facility] Pager: [phone] Cell: [phone] After 3pm or if no response, call [phone]

## 2014-12-12 NOTE — Consult Note (Signed)
Physical Medicine and Rehabilitation Consult Reason for Consult: Nontraumatic Left frontal ICH  Referring Physician: Dr.Xu   HPI: Richard [name] is a 69 y.o. 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 By report patient lives with his wife independent prior to admission. Presented 12/10/2014 with aphasia and right sided weakness. No recent head trauma, no use of anticoagulation or antiplatelets. Systolic blood pressure greater than 200 in the ED. CT of the brain showed evidence of lobar left frontal ICH measuring 3.5 x 2.5 cm. Mild surrounding vasogenic edema but no hydrocephalus. MRI of the brain showed acute left posterior frontal intracerebral hemorrhage 30 x 24 x 32 mm favored to represent a lobar hypertensive bleed. No sign suggestive of vascular malformation or cortical venous thrombosis. Echocardiogram with ejection fraction 65% grade 1 diastolic dysfunction. Neurology consulted advise conservative care follow-up cranial CT scan stable. Tolerating a regular consistency diet. Close monitoring of blood pressure. Physical and occupational therapy evaluations completed with recommendations of physical medicine rehabilitation consult.  Patient onCIWA protocol, received lorazepam approximately 3 hours ago  Review of Systems  Unable to perform ROS: language   Past Medical History  Diagnosis Date  . Diabetes mellitus without complication   . Stroke    History reviewed. No pertinent past surgical history. No family history on file. Social History:  reports that he has never smoked. He does not have any smokeless tobacco history on file. He reports that he drinks alcohol. His drug history is not on file. Allergies: No Known Allergies Medications Prior to Admission  Medication Sig Dispense Refill  . carvedilol (COREG) 25 MG tablet Take 25 mg by mouth 2 (two) times daily with a meal.    . insulin regular  (NOVOLIN R,HUMULIN R) 100 units/mL injection Inject 100 Units into the skin 3 (three) times daily before meals.    Marland Kitchen lisinopril (PRINIVIL,ZESTRIL) 20 MG tablet Take 20 mg by mouth daily.    Marland Kitchen NOVOLIN N RELION 100 UNIT/ML injection Inject 30-40 Units into the skin daily before breakfast. Takes 40 units in the morning and 30-35 units at night    . olmesartan-hydrochlorothiazide (BENICAR HCT) 40-25 MG per tablet Take 1 tablet by mouth daily.      Home: Home Living Additional Comments: Unable to get history from pt, no family present  Functional History: Prior Function Comments: no family present to ascertain PLOF Functional Status:  Mobility: Bed Mobility Overal bed mobility: Needs Assistance Bed Mobility: Supine to Sit, Sit to Supine Supine to sit: Min assist Sit to supine: Min assist General bed mobility comments: min physical cues for direction and min truncal assist Transfers Overall transfer level: Needs assistance Equipment used: 1 person hand held assist Transfers: Sit to/from Stand Sit to Stand: Mod assist, +2 safety/equipment, +2 physical assistance General transfer comment: physical assist for direction due to no apparent verbal comprehension.  Steady assist Ambulation/Gait Ambulation/Gait assistance: Mod assist, +2 safety/equipment, +2 physical assistance Ambulation Distance (Feet): 50 Feet Assistive device: 1 person hand held assist, 2 person hand held assist Gait Pattern/deviations: Step-through pattern, Scissoring, Ataxic, Wide base of support General Gait Details: pt with staggered, scissored wide BOS gait, Pt with R gaze preference, only looking R if head physically turned right.  Pt hitting obstacles on the right and unable to problem solve how to get around the obstacle without physical direction. Gait velocity: slow and deliberate Gait velocity interpretation: Below normal speed for age/gender  ADL:    Cognition: Cognition Overall Cognitive Status: Difficult  to assess Arousal/Alertness: Awake/alert Orientation Level: Other (comment) (Unable to assess) Attention: Focused Focused Attention: Appears intact Cognition Arousal/Alertness: Awake/alert Behavior During Therapy: Flat affect Overall Cognitive Status: Difficult to assess Difficult to assess due to: Impaired communication  Blood pressure 167/89, pulse 76, temperature 98.7 F (37.1 C), temperature source Oral, resp. rate 15, height  (1.88 m), weight 102.059 kg (225 lb), SpO2 100 %. Physical Exam  HENT:  Right facial weakness  Eyes:  Pupils reactive to light without nystagmus  Neck: Normal range of motion. Neck supple. No thyromegaly present.  Cardiovascular: Normal rate and regular rhythm.   Respiratory: Effort normal and breath sounds normal. No respiratory distress.  GI: Soft. Bowel sounds are normal. He exhibits no distension.  Neurological:  Lethargic but arousable. Global aphasia. He would answer some spontaneous questions with yes but inconsistent. He did not follow commands during my exam. Withdrawal to deep stimuli  Skin: Skin is warm and dry.  Patient has Difficulty cooperating with manual muscle testing, grossly 2 minus right deltoid, biceps, triceps, grip, 3 minus right lower extremity. At least antigravity on the left side as well. Does not withdraw to pinch in all 4 limbs Patient arouses to sternal rub but not to verbal cues Tone appears normal in bilateral upper and lower limbs  Results for orders placed or performed during the hospital encounter of 12/10/14 (from the past 24 hour(s))  CBC     Status: Abnormal   Collection Time: 12/11/14  7:25 AM  Result Value Ref Range   WBC 9.0 4.0 - 10.5 K/uL   RBC 4.37 4.22 - 5.81 MIL/uL   Hemoglobin 13.4 13.0 - 17.0 g/dL   HCT 13.0 (L) 86.5 - 78.4 %   MCV 89.0 78.0 - 100.0 fL   MCH 30.7 26.0 - 34.0 pg   MCHC 34.4 30.0 - 36.0 g/dL   RDW 69.6 29.5 - 28.4 %   Platelets 221 150 - 400 K/uL  Basic metabolic panel     Status:  Abnormal   Collection Time: 12/11/14  7:25 AM  Result Value Ref Range   Sodium 131 (L) 135 - 145 mmol/L   Potassium 3.1 (L) 3.5 - 5.1 mmol/L   Chloride 95 (L) 101 - 111 mmol/L   CO2 24 22 - 32 mmol/L   Glucose, Bld 271 (H) 65 - 99 mg/dL   BUN <5 (L) 6 - 20 mg/dL   Creatinine, Ser 1.32 0.61 - 1.24 mg/dL   Calcium 8.5 (L) 8.9 - 10.3 mg/dL   GFR calc non Af Amer >60 >60 mL/min   GFR calc Af Amer >60 >60 mL/min   Anion gap 12 5 - 15  Hemoglobin A1c     Status: Abnormal   Collection Time: 12/11/14  7:25 AM  Result Value Ref Range   Hgb A1c MFr Bld 7.5 (H) 4.8 - 5.6 %   Mean Plasma Glucose 169 mg/dL  TSH     Status: None   Collection Time: 12/11/14  7:25 AM  Result Value Ref Range   TSH 1.983 0.350 - 4.500 uIU/mL  Lipid panel     Status: Abnormal   Collection Time: 12/11/14  7:25 AM  Result Value Ref Range   Cholesterol 187 0 - 200 mg/dL   Triglycerides 440 (H) <150 mg/dL   HDL 41 >10 mg/dL   Total CHOL/HDL Ratio 4.6 RATIO   VLDL 44 (H) 0 - 40 mg/dL   LDL  Cholesterol 102 (H) 0 - 99 mg/dL  Vitamin Z61     Status: None   Collection Time: 12/11/14  7:25 AM  Result Value Ref Range   Vitamin B-12 240 180 - 914 pg/mL  Glucose, capillary     Status: Abnormal   Collection Time: 12/11/14  7:34 AM  Result Value Ref Range   Glucose-Capillary 284 (H) 65 - 99 mg/dL  Glucose, capillary     Status: Abnormal   Collection Time: 12/11/14 11:29 AM  Result Value Ref Range   Glucose-Capillary 228 (H) 65 - 99 mg/dL  Urinalysis with microscopic     Status: Abnormal   Collection Time: 12/11/14  1:07 PM  Result Value Ref Range   Color, Urine YELLOW YELLOW   APPearance CLEAR CLEAR   Specific Gravity, Urine 1.026 1.005 - 1.030   pH 7.0 5.0 - 8.0   Glucose, UA >1000 (A) NEGATIVE mg/dL   Hgb urine dipstick NEGATIVE NEGATIVE   Bilirubin Urine NEGATIVE NEGATIVE   Ketones, ur 40 (A) NEGATIVE mg/dL   Protein, ur NEGATIVE NEGATIVE mg/dL   Urobilinogen, UA 1.0 0.0 - 1.0 mg/dL   Nitrite NEGATIVE  NEGATIVE   Leukocytes, UA NEGATIVE NEGATIVE   WBC, UA 0-2 <3 WBC/hpf   RBC / HPF 0-2 <3 RBC/hpf   Bacteria, UA FEW (A) RARE   Squamous Epithelial / LPF RARE RARE   Casts GRANULAR CAST (A) NEGATIVE  Urine rapid drug screen (hosp performed)     Status: None   Collection Time: 12/11/14  1:07 PM  Result Value Ref Range   Opiates NONE DETECTED NONE DETECTED   Cocaine NONE DETECTED NONE DETECTED   Benzodiazepines NONE DETECTED NONE DETECTED   Amphetamines NONE DETECTED NONE DETECTED   Tetrahydrocannabinol NONE DETECTED NONE DETECTED   Barbiturates NONE DETECTED NONE DETECTED  Glucose, capillary     Status: Abnormal   Collection Time: 12/11/14  4:04 PM  Result Value Ref Range   Glucose-Capillary 128 (H) 65 - 99 mg/dL  Sodium     Status: Abnormal   Collection Time: 12/11/14  4:30 PM  Result Value Ref Range   Sodium 132 (L) 135 - 145 mmol/L  Glucose, capillary     Status: Abnormal   Collection Time: 12/11/14 11:25 PM  Result Value Ref Range   Glucose-Capillary 209 (H) 65 - 99 mg/dL   Comment 1 Notify RN   CBC     Status: Abnormal   Collection Time: 12/12/14  2:36 AM  Result Value Ref Range   WBC 8.5 4.0 - 10.5 K/uL   RBC 4.33 4.22 - 5.81 MIL/uL   Hemoglobin 13.0 13.0 - 17.0 g/dL   HCT 09.6 (L) 04.5 - 40.9 %   MCV 89.8 78.0 - 100.0 fL   MCH 30.0 26.0 - 34.0 pg   MCHC 33.4 30.0 - 36.0 g/dL   RDW 81.1 91.4 - 78.2 %   Platelets 210 150 - 400 K/uL  Basic metabolic panel     Status: Abnormal   Collection Time: 12/12/14  2:36 AM  Result Value Ref Range   Sodium 128 (L) 135 - 145 mmol/L   Potassium 3.9 3.5 - 5.1 mmol/L   Chloride 96 (L) 101 - 111 mmol/L   CO2 23 22 - 32 mmol/L   Glucose, Bld 202 (H) 65 - 99 mg/dL   BUN 6 6 - 20 mg/dL   Creatinine, Ser 9.56 0.61 - 1.24 mg/dL   Calcium 8.4 (L) 8.9 - 10.3 mg/dL   GFR calc  non Af Amer >60 >60 mL/min   GFR calc Af Amer >60 >60 mL/min   Anion gap 9 5 - 15   Ct Head Wo Contrast  12/11/2014   CLINICAL DATA:  Follow-up left  intracranial hemorrhage  EXAM: CT HEAD WITHOUT CONTRAST  TECHNIQUE: Contiguous axial images were obtained from the base of the skull through the vertex without intravenous contrast.  COMPARISON:  Yesterday  FINDINGS: Skull and Sinuses:Chronic appearing sinusitis bilaterally. No acute finding.  Orbits: Left enucleation with prosthesis.  Brain: Unchanged peripheral hemispheric hematoma in the posterior left frontal region measuring up to 35 mm. As previously calculated, hematoma volume is approximately 12 cc. Rim of surrounding edema is stable and there is no progressive mass effect. No new site of hemorrhage. No subarachnoid or intraventricular extension. Extensive chronic small vessel disease with gliosis throughout the bilateral cerebral white matter. No newly seen infarct.  IMPRESSION: Unchanged left frontal parenchymal hematoma, approximately 12 cc volume.   Electronically Signed   By: Marnee Spring M.D.   On: 12/11/2014 11:53   Ct Head Wo Contrast  12/10/2014   CLINICAL DATA:  Right facial droop and aphasia. Last seen normal last night. History of diabetes. Initial encounter.  EXAM: CT HEAD WITHOUT CONTRAST  TECHNIQUE: Contiguous axial images were obtained from the base of the skull through the vertex without intravenous contrast.  COMPARISON:  None.  FINDINGS: There is an acute hematoma within the posterior left frontal white matter, measuring 3.5 x 2.5 cm transverse on image 19. This has an estimated volume of approximately of 17.5 ml. There is mild surrounding vasogenic edema. There is loss of gray-white differentiation within the overlying cortex superiorly. No midline shift, hydrocephalus, extra-axial fluid collection or other areas of intracranial hemorrhage demonstrated. There is confluent low-density within the periventricular and subcortical white matter bilaterally. Intracranial vascular calcifications noted.  There is mild ethmoid sinus mucosal thickening. Left ocular prosthesis noted. The  visualized paranasal sinuses, mastoid air cells and middle ears are otherwise clear. The calvarium is intact.  IMPRESSION: 1. Acute left frontal hemorrhage with surrounding edema, likely representing atypical hemorrhagic stroke. A hemorrhagic mass is considered less likely. CT or MR follow up suggested. 2. Confluent low-density in the periventricular and subcortical white matter consistent with chronic small vessel ischemic changes. 3. Critical Value/emergent results were called by telephone at the time of interpretation on 12/10/2014 at 9:40 am to Dr. Blake Divine , who verbally acknowledged these results.   Electronically Signed   By: Carey Bullocks M.D.   On: 12/10/2014 09:42   Mr Maxine Glenn Head Wo Contrast  12/10/2014   CLINICAL DATA:  RIGHT-sided facial droop, RIGHT-sided weakness, and expressive aphasia. Last seen normal 11 p.m. 12/09/2014.  EXAM: MRI HEAD WITHOUT CONTRAST  MRA HEAD WITHOUT CONTRAST  TECHNIQUE: Multiplanar, multiecho pulse sequences of the brain and surrounding structures were obtained without intravenous contrast. Angiographic images of the head were obtained using MRA technique without contrast.  COMPARISON:  CT head earlier today.  FINDINGS: MRI HEAD FINDINGS  There is an acute intracerebral hemorrhage, LEFT posterior frontal lobe, measuring 30 x 24 x 32 mm. Estimated volume of 12 mL. Mild surrounding edema. Restricted diffusion does not extend outside the hematoma, and is favored represent a primary intracerebral hemorrhage. No abnormal postcontrast enhancement within or surrounding the lesion.  Moderate cerebral and cerebellar atrophy. Extensive T2 and FLAIR hyperintensity throughout periventricular and subcortical white matter likely chronic microvascular ischemic change. Remote chronic infarction RIGHT posterior frontal region, nonhemorrhagic. No foci of  prior chronic hemorrhage. Flow voids are maintained. Prosthetic LEFT globe. Extracranial soft tissues unremarkable.  Post infusion, no  abnormal intra-axial enhancement. Major dural venous sinuses are patent. No signs of cortical venous thrombosis.  Compared with recent CT, there may be slight clot retraction.  MRA HEAD FINDINGS  Internal carotid arteries widely patent. Basilar artery widely patent. RIGHT vertebral dominant. LEFT vertebral distal stenosis. No intracranial stenosis or aneurysm. No visible vascular malformation.  IMPRESSION: Acute LEFT posterior frontal intracerebral hemorrhage 30 x 24 x 32 mm, with estimated volume 12 mL. Mild surrounding edema. This is favored to represent a lobar hypertensive bleed. Cerebral amyloid angiopathy is not excluded. Doubt embolic infarction or hemorrhagic tumor. No sign suggestive of vascular malformation or cortical venous thrombosis.  Moderate atrophy with extensive white matter signal abnormality, likely chronic microvascular ischemic change.   Electronically Signed   By: Elsie Stain M.D.   On: 12/10/2014 12:33   Mr Laqueta Jean ZO Contrast  12/10/2014   CLINICAL DATA:  RIGHT-sided facial droop, RIGHT-sided weakness, and expressive aphasia. Last seen normal 11 p.m. 12/09/2014.  EXAM: MRI HEAD WITHOUT CONTRAST  MRA HEAD WITHOUT CONTRAST  TECHNIQUE: Multiplanar, multiecho pulse sequences of the brain and surrounding structures were obtained without intravenous contrast. Angiographic images of the head were obtained using MRA technique without contrast.  COMPARISON:  CT head earlier today.  FINDINGS: MRI HEAD FINDINGS  There is an acute intracerebral hemorrhage, LEFT posterior frontal lobe, measuring 30 x 24 x 32 mm. Estimated volume of 12 mL. Mild surrounding edema. Restricted diffusion does not extend outside the hematoma, and is favored represent a primary intracerebral hemorrhage. No abnormal postcontrast enhancement within or surrounding the lesion.  Moderate cerebral and cerebellar atrophy. Extensive T2 and FLAIR hyperintensity throughout periventricular and subcortical white matter likely chronic  microvascular ischemic change. Remote chronic infarction RIGHT posterior frontal region, nonhemorrhagic. No foci of prior chronic hemorrhage. Flow voids are maintained. Prosthetic LEFT globe. Extracranial soft tissues unremarkable.  Post infusion, no abnormal intra-axial enhancement. Major dural venous sinuses are patent. No signs of cortical venous thrombosis.  Compared with recent CT, there may be slight clot retraction.  MRA HEAD FINDINGS  Internal carotid arteries widely patent. Basilar artery widely patent. RIGHT vertebral dominant. LEFT vertebral distal stenosis. No intracranial stenosis or aneurysm. No visible vascular malformation.  IMPRESSION: Acute LEFT posterior frontal intracerebral hemorrhage 30 x 24 x 32 mm, with estimated volume 12 mL. Mild surrounding edema. This is favored to represent a lobar hypertensive bleed. Cerebral amyloid angiopathy is not excluded. Doubt embolic infarction or hemorrhagic tumor. No sign suggestive of vascular malformation or cortical venous thrombosis.  Moderate atrophy with extensive white matter signal abnormality, likely chronic microvascular ischemic change.   Electronically Signed   By: Elsie Stain M.D.   On: 12/10/2014 12:33   Dg Chest Port 1 View  12/11/2014   CLINICAL DATA:  69 year old male with a history of cough  EXAM: PORTABLE CHEST - 1 VIEW  COMPARISON:  No prior chest x-ray  FINDINGS: Cardiomediastinal silhouette within normal limits. Tortuosity of the thoracic aorta.  No evidence of pulmonary vascular congestion.  Low lung volumes accentuates the interstitium.  No confluent airspace disease, pneumothorax, or pleural effusion.  IMPRESSION: Low lung volumes with no radiographic evidence of acute cardiopulmonary disease.  Signed,  Yvone Neu. Loreta Ave, DO  [facility]  [facility]   Electronically Signed   By: Gilmer Mor D.O.   On: 12/11/2014 08:07    Assessment/Plan: Diagnosis:  Left posterior frontal  intracranial hemorrhage with right hemiparesis and aphasia. Less alert today. Discussed with neurology, will have repeat CT scan, blood pressures are running higher will need IV Cardene drip 1. Does the need for close, 24 hr/day medical supervision in concert with the patient's rehab needs make it unreasonable for this patient to be served in a less intensive setting? Yes 2. Co-Morbidities requiring supervision/potential complications: Alcohol abuse, dysphasia, diabetes with peripheral neuropathy, history of left prosthetic eye 3. Due to bladder management, bowel management, safety, skin/wound care, disease management, medication administration, pain management and patient education, does the patient require 24 hr/day rehab nursing? Yes 4. Does the patient require coordinated care of a physician, rehab nurse, PT (1-2 hrs/day, 5 days/week), OT (12 hrs/day, 5 days/week) and SLP (0.5-1 hrs/day, 5 days/week) to address physical and functional deficits in the context of the above medical diagnosis(es)? Yes Addressing deficits in the following areas: balance, endurance, locomotion, strength, transferring, bowel/bladder control, bathing, dressing, feeding, grooming, toileting, cognition, speech, language, swallowing and psychosocial support 5. Can the patient actively participate in an intensive therapy program of at least 3 hrs of therapy per day at least 5 days per week? No 6. The potential for patient to make measurable gains while on inpatient rehab is good once above issues are resolved 7. Anticipated functional outcomes upon discharge from inpatient rehab are supervision  with PT, supervision with OT, supervision with SLP. 8. Estimated rehab length of stay to reach the above functional goals is: 18-22 days 9. Does the patient have adequate social supports and living environment to accommodate these discharge functional goals? Potentially 10. Anticipated D/C setting: Home 11. Anticipated post D/C  treatments: HH therapy 12. Overall Rehab/Functional Prognosis: good  RECOMMENDATIONS: This patient's condition is appropriate for continued rehabilitative care in the following setting: CIR once medically and neurologically stable Patient has agreed to participate in recommended program. N/A Note that insurance prior authorization may be required for reimbursement for recommended care.  Comment: Needs IV blood pressure medicines, we'll need to see whether patient has had a new event and how this affects him from a neurological and functional standpoint    12/12/2014

## 2014-12-12 NOTE — Progress Notes (Signed)
eLink Physician-Brief Progress Note Patient Name: Richard [name] DOB: 11/14/1945 MRN: [id number]   Date of Service  12/12/2014  HPI/Events of Note  Camera check post intubation. Normotensive & ETT in good position on CXR. Dr. Kendrick Fries discussed with Neuro>>Stat EEG.  eICU Interventions  Remains intubated & awaiting stat EEG.     Intervention Category Major Interventions: Change in mental status - evaluation and management  Lawanda Cousins 12/12/2014, 4:44 PM

## 2014-12-12 NOTE — Consult Note (Signed)
Name: Richard [name] MRN: [id number] DOB: 31-Dec-1945    ADMISSION DATE:  12/10/2014 CONSULTATION DATE:  12/12/2014  REFERRING MD :  Roda Shutters, neuro   CHIEF COMPLAINT:  Altered MS  BRIEF PATIENT DESCRIPTION: 69 year old diabetic, history of CVA presented 9/14 with aphasia and right-sided weakness, CT showed left frontal ICH. He remained encephalopathic, placed on Ativan per CIWA protocol for agitation, [facility] M consulted 9/16 for hyponatremia and persistent encephalopathy  SIGNIFICANT EVENTS  9/16 CIR consult  STUDIES:  9/14 head CT-3.52.5 cm left frontal hemorrhage 9/14 MRI- 3 cm left frontal hemorrhage 9/15 echo-normal LV function 9/16 head CT stable left frontal hematoma    PAST MEDICAL HISTORY :   has a past medical history of Diabetes mellitus without complication and Stroke.  has no past surgical history on file. Prior to Admission medications   Medication Sig Start Date End Date Taking? Authorizing Provider  carvedilol (COREG) 25 MG tablet Take 25 mg by mouth 2 (two) times daily with a meal.   Yes Historical Provider, MD  insulin regular (NOVOLIN R,HUMULIN R) 100 units/mL injection Inject 100 Units into the skin 3 (three) times daily before meals.   Yes Historical Provider, MD  lisinopril (PRINIVIL,ZESTRIL) 20 MG tablet Take 20 mg by mouth daily.   Yes Historical Provider, MD  NOVOLIN N RELION 100 UNIT/ML injection Inject 30-40 Units into the skin daily before breakfast. Takes 40 units in the morning and 30-35 units at night 11/08/14  Yes Historical Provider, MD  olmesartan-hydrochlorothiazide (BENICAR HCT) 40-25 MG per tablet Take 1 tablet by mouth daily.   Yes Historical Provider, MD   No Known Allergies  FAMILY HISTORY:  family history is not on file. SOCIAL HISTORY:  reports that he has never smoked. He does not have any smokeless tobacco history on file. He reports that he drinks alcohol.  REVIEW OF SYSTEMS:  Unable to obtain since altered mental statusSUBJECTIVE:   VITAL  SIGNS: Temp:  [98 F (36.7 C)-100.3 F (37.9 C)] 98 F (36.7 C) (09/16 0800) Pulse Rate:  [66-88] 82 (09/16 1000) Resp:  [12-23] 13 (09/16 1000) BP: (132-181)/(70-112) 158/91 mmHg (09/16 1000) SpO2:  [80 %-100 %] 100 % (09/16 1000)  PHYSICAL EXAMINATION: Gen. well-nourished, in no distress, intermittent agitation ENT - no lesions, no post nasal drip, left artificial eye, right cataract-reactive to light Neck: No JVD, no thyromegaly, no carotid bruits Lungs: no use of accessory muscles, no dullness to percussion, clear without rales or rhonchi  Cardiovascular: Rhythm regular, heart sounds  normal, no murmurs, no peripheral edema Abdomen: soft and non-tender, no hepatosplenomegaly, BS normal. Musculoskeletal: No deformities, no cyanosis or clubbing Neuro: Follows intermittent commands on left, power 2/5 on right, > 3/5 on left Skin:  Warm, no lesions/ rash    Recent Labs Lab 12/10/14 0920 12/10/14 0928 12/11/14 0725 12/11/14 1630 12/12/14 0236  NA 136 137 131* 132* 128*  K 3.5 3.5 3.1*  --  3.9  CL 99* 98* 95*  --  96*  CO2 28  --  24  --  23  BUN 12 11 <5*  --  6  CREATININE 1.13 1.10 0.87  --  1.00  GLUCOSE 225* 227* 271*  --  202*    Recent Labs Lab 12/10/14 0920 12/10/14 0928 12/11/14 0725 12/12/14 0236  HGB 14.2 15.3 13.4 13.0  HCT 41.2 45.0 38.9* 38.9*  WBC 8.6  --  9.0 8.5  PLT 238  --  221 210   Ct Head Wo Contrast  12/12/2014  CLINICAL DATA:  Change in mental status.  Patient now nonverbal.  EXAM: CT HEAD WITHOUT CONTRAST  TECHNIQUE: Contiguous axial images were obtained from the base of the skull through the vertex without intravenous contrast.  COMPARISON:  12/11/2014.  FINDINGS: Redemonstrated is a LEFT posterior frontal intracerebral hematoma, mild surrounding edema, cross-section of 34 x 27 mm, similar in appearance to the most recent prior CT from 09/15. Slight midline shift LEFT to RIGHT as measured at the septum pellucidum, of approximately 2 mm,  also stable. Mild atrophy. Extensive chronic microvascular ischemic change throughout the white matter. Scattered areas of chronic cortical infarction and lacunar infarction. Calvarium intact. LEFT ocular prosthesis. LEFT maxillary sinus disease. Vascular calcification.  IMPRESSION: Stable LEFT posterior frontal intracerebral hematoma, without significant extension or worsening midline shift. Chronic changes also stable.   Electronically Signed   By: Elsie Stain M.D.   On: 12/12/2014 10:14   Ct Head Wo Contrast  12/11/2014   CLINICAL DATA:  Follow-up left intracranial hemorrhage  EXAM: CT HEAD WITHOUT CONTRAST  TECHNIQUE: Contiguous axial images were obtained from the base of the skull through the vertex without intravenous contrast.  COMPARISON:  Yesterday  FINDINGS: Skull and Sinuses:Chronic appearing sinusitis bilaterally. No acute finding.  Orbits: Left enucleation with prosthesis.  Brain: Unchanged peripheral hemispheric hematoma in the posterior left frontal region measuring up to 35 mm. As previously calculated, hematoma volume is approximately 12 cc. Rim of surrounding edema is stable and there is no progressive mass effect. No new site of hemorrhage. No subarachnoid or intraventricular extension. Extensive chronic small vessel disease with gliosis throughout the bilateral cerebral white matter. No newly seen infarct.  IMPRESSION: Unchanged left frontal parenchymal hematoma, approximately 12 cc volume.   Electronically Signed   By: Marnee Spring M.D.   On: 12/11/2014 11:53   Mr Maxine Glenn Head Wo Contrast  12/10/2014   CLINICAL DATA:  RIGHT-sided facial droop, RIGHT-sided weakness, and expressive aphasia. Last seen normal 11 p.m. 12/09/2014.  EXAM: MRI HEAD WITHOUT CONTRAST  MRA HEAD WITHOUT CONTRAST  TECHNIQUE: Multiplanar, multiecho pulse sequences of the brain and surrounding structures were obtained without intravenous contrast. Angiographic images of the head were obtained using MRA technique without  contrast.  COMPARISON:  CT head earlier today.  FINDINGS: MRI HEAD FINDINGS  There is an acute intracerebral hemorrhage, LEFT posterior frontal lobe, measuring 30 x 24 x 32 mm. Estimated volume of 12 mL. Mild surrounding edema. Restricted diffusion does not extend outside the hematoma, and is favored represent a primary intracerebral hemorrhage. No abnormal postcontrast enhancement within or surrounding the lesion.  Moderate cerebral and cerebellar atrophy. Extensive T2 and FLAIR hyperintensity throughout periventricular and subcortical white matter likely chronic microvascular ischemic change. Remote chronic infarction RIGHT posterior frontal region, nonhemorrhagic. No foci of prior chronic hemorrhage. Flow voids are maintained. Prosthetic LEFT globe. Extracranial soft tissues unremarkable.  Post infusion, no abnormal intra-axial enhancement. Major dural venous sinuses are patent. No signs of cortical venous thrombosis.  Compared with recent CT, there may be slight clot retraction.  MRA HEAD FINDINGS  Internal carotid arteries widely patent. Basilar artery widely patent. RIGHT vertebral dominant. LEFT vertebral distal stenosis. No intracranial stenosis or aneurysm. No visible vascular malformation.  IMPRESSION: Acute LEFT posterior frontal intracerebral hemorrhage 30 x 24 x 32 mm, with estimated volume 12 mL. Mild surrounding edema. This is favored to represent a lobar hypertensive bleed. Cerebral amyloid angiopathy is not excluded. Doubt embolic infarction or hemorrhagic tumor. No sign suggestive of vascular malformation or  cortical venous thrombosis.  Moderate atrophy with extensive white matter signal abnormality, likely chronic microvascular ischemic change.   Electronically Signed   By: Elsie Stain M.D.   On: 12/10/2014 12:33   Mr Laqueta Jean ZO Contrast  12/10/2014   CLINICAL DATA:  RIGHT-sided facial droop, RIGHT-sided weakness, and expressive aphasia. Last seen normal 11 p.m. 12/09/2014.  EXAM: MRI HEAD  WITHOUT CONTRAST  MRA HEAD WITHOUT CONTRAST  TECHNIQUE: Multiplanar, multiecho pulse sequences of the brain and surrounding structures were obtained without intravenous contrast. Angiographic images of the head were obtained using MRA technique without contrast.  COMPARISON:  CT head earlier today.  FINDINGS: MRI HEAD FINDINGS  There is an acute intracerebral hemorrhage, LEFT posterior frontal lobe, measuring 30 x 24 x 32 mm. Estimated volume of 12 mL. Mild surrounding edema. Restricted diffusion does not extend outside the hematoma, and is favored represent a primary intracerebral hemorrhage. No abnormal postcontrast enhancement within or surrounding the lesion.  Moderate cerebral and cerebellar atrophy. Extensive T2 and FLAIR hyperintensity throughout periventricular and subcortical white matter likely chronic microvascular ischemic change. Remote chronic infarction RIGHT posterior frontal region, nonhemorrhagic. No foci of prior chronic hemorrhage. Flow voids are maintained. Prosthetic LEFT globe. Extracranial soft tissues unremarkable.  Post infusion, no abnormal intra-axial enhancement. Major dural venous sinuses are patent. No signs of cortical venous thrombosis.  Compared with recent CT, there may be slight clot retraction.  MRA HEAD FINDINGS  Internal carotid arteries widely patent. Basilar artery widely patent. RIGHT vertebral dominant. LEFT vertebral distal stenosis. No intracranial stenosis or aneurysm. No visible vascular malformation.  IMPRESSION: Acute LEFT posterior frontal intracerebral hemorrhage 30 x 24 x 32 mm, with estimated volume 12 mL. Mild surrounding edema. This is favored to represent a lobar hypertensive bleed. Cerebral amyloid angiopathy is not excluded. Doubt embolic infarction or hemorrhagic tumor. No sign suggestive of vascular malformation or cortical venous thrombosis.  Moderate atrophy with extensive white matter signal abnormality, likely chronic microvascular ischemic change.    Electronically Signed   By: Elsie Stain M.D.   On: 12/10/2014 12:33   Dg Chest Port 1 View  12/11/2014   CLINICAL DATA:  69 year old male with a history of cough  EXAM: PORTABLE CHEST - 1 VIEW  COMPARISON:  No prior chest x-ray  FINDINGS: Cardiomediastinal silhouette within normal limits. Tortuosity of the thoracic aorta.  No evidence of pulmonary vascular congestion.  Low lung volumes accentuates the interstitium.  No confluent airspace disease, pneumothorax, or pleural effusion.  IMPRESSION: Low lung volumes with no radiographic evidence of acute cardiopulmonary disease.  Signed,  Yvone Neu. Loreta Ave, DO  [facility]  [facility]   Electronically Signed   By: Gilmer Mor D.O.   On: 12/11/2014 08:07    ASSESSMENT / PLAN:  Acute encephalopathy- multifactorial, related to ICH, alcohol withdrawal and Ativan, and hyponatremia  EtOH withdrawal- use Precedex, this will help [place] titrate to his exact needs and avoid oversedation with Ativan  Hyponatremia- sodium was 136 on 9/14, now dropped to 128 - will  use normal saline-If drops lower than 125 and mental status remains poor will add 3% saline  PCCM to follow  [facility] V. MD  [phone]   12/12/2014, 11:22 AM

## 2014-12-12 NOTE — Progress Notes (Signed)
RT weaned FIO2 to 40% based on ABG results. Sat 100% after change was made.

## 2014-12-12 NOTE — Progress Notes (Addendum)
CIWA Score unreliable as pt is aphasic . Pt increasingly more restless .

## 2014-12-12 NOTE — Care Management Important Message (Signed)
Important Message  Patient Details  Name: Richard [name] MRN: [id number] Date of Birth: 1946/01/30   Medicare Important Message Given:  Yes-second notification given    Kyla Balzarine 12/12/2014, 11:28 AM

## 2014-12-12 NOTE — Progress Notes (Signed)
Physical Therapy Treatment Patient Details Name: Richard [name] MRN: [id number] DOB: [birth year] Today's Date: 12/12/2014    History of Present Illness Richard [name] is an 69 y.o. male, right handed, with a past medical history significant for DM, stroke without residual deficits, s/p artificial left eye, brought in for evaluation of aphasia, R fackal weakness and R sided weakness.  CT/MRI shows L frontal ICH    PT Comments    Pt did not handle treatment today.  Assisted pt to EOB with 2 person maximal assist and suddenly pt became unresponsive with BP 54/38 and was returned to supine.  Follow Up Recommendations  CIR     Equipment Recommendations  Other (comment)    Recommendations for Other Services Rehab consult     Precautions / Restrictions Precautions Precautions: Fall    Mobility  Bed Mobility Overal bed mobility: Needs Assistance Bed Mobility: Supine to Sit;Sit to Supine     Supine to sit: Total assist;+2 for physical assistance (pt resistant to coming forward) Sit to supine: Total assist;+2 for physical assistance (pt had passed out)   General bed mobility comments: max phsical cues for direction today  Transfers                 General transfer comment: unable to progress to standing, because pt passed out  Ambulation/Gait                 Stairs            Wheelchair Mobility    Modified Rankin (Stroke Patients Only) Modified Rankin (Stroke Patients Only) Pre-Morbid Rankin Score: No symptoms Modified Rankin: Severe disability     Balance Overall balance assessment: Needs assistance Sitting-balance support: Bilateral upper extremity supported;Single extremity supported Sitting balance-Leahy Scale: Poor Sitting balance - Comments: pt resistant to effort to get him to bring his trunk forward off the bed and sitting up on side of bed.  2 person assist to get pt fully upright for 3-5 min and abruptly pt went from significant effort to  "asleep" with snoring in less than 10 secs.  BP showed that pt had passed out with BP of 54/38.  Pt returned to supine                            Cognition Arousal/Alertness: Lethargic Behavior During Therapy: Flat affect Overall Cognitive Status: Difficult to assess                      Exercises      General Comments General comments (skin integrity, edema, etc.): pt not as alert as evaluation 9/15.  Resistant to sitting up then seemingly fell asleep and found to have BP 54/38, then BP 93/64 in supine and minutes later 110/61.  Pt did not wake up during the period of repositioning him.      Pertinent Vitals/Pain Pain Assessment: Faces Faces Pain Scale: No hurt    Home Living                      Prior Function            PT Goals (current goals can now be found in the care plan section) Acute Rehab PT Goals Patient Stated Goal: pt unable to participate in goals PT Goal Formulation: Patient unable to participate in goal setting Time For Goal Achievement: 12/25/14 Potential to Achieve Goals: Good Progress towards PT goals: Not progressing  toward goals - comment (unable to progress mobility)    Frequency  Min 3X/week    PT Plan Current plan remains appropriate    Co-evaluation PT/OT/SLP Co-Evaluation/Treatment: Yes Reason for Co-Treatment: For patient/therapist safety PT goals addressed during session: Mobility/safety with mobility       End of Session   Activity Tolerance: Other (comment) (Treatment limited by falling BP and pt passing out.) Patient left: in bed;with call bell/phone within reach     Time: 1411-1438 PT Time Calculation (min) (ACUTE ONLY): 27 min  Charges:  $Therapeutic Activity: 8-22 mins                    G Codes:      Mottinger, Eliseo Gum 12/12/2014, 3:09 PM  12/12/2014  Kaplan Bing, PT [phone]  (pager)

## 2014-12-13 ENCOUNTER — Encounter (HOSPITAL_COMMUNITY): Payer: Self-pay | Admitting: *Deleted

## 2014-12-13 DIAGNOSIS — I610 Nontraumatic intracerebral hemorrhage in hemisphere, subcortical: Secondary | ICD-10-CM

## 2014-12-13 DIAGNOSIS — Z789 Other specified health status: Secondary | ICD-10-CM

## 2014-12-13 DIAGNOSIS — J9601 Acute respiratory failure with hypoxia: Secondary | ICD-10-CM

## 2014-12-13 DIAGNOSIS — F10231 Alcohol dependence with withdrawal delirium: Secondary | ICD-10-CM

## 2014-12-13 DIAGNOSIS — I619 Nontraumatic intracerebral hemorrhage, unspecified: Secondary | ICD-10-CM

## 2014-12-13 LAB — BASIC METABOLIC PANEL
ANION GAP: 11 (ref 5–15)
BUN: 14 mg/dL (ref 6–20)
CALCIUM: 8.4 mg/dL — AB (ref 8.9–10.3)
CHLORIDE: 98 mmol/L — AB (ref 101–111)
CO2: 21 mmol/L — AB (ref 22–32)
Creatinine, Ser: 1.3 mg/dL — ABNORMAL HIGH (ref 0.61–1.24)
GFR calc non Af Amer: 54 mL/min — ABNORMAL LOW (ref >60)
Glucose, Bld: 155 mg/dL — ABNORMAL HIGH (ref 65–99)
Potassium: 3.3 mmol/L — ABNORMAL LOW (ref 3.5–5.1)
SODIUM: 130 mmol/L — AB (ref 135–145)

## 2014-12-13 LAB — GLUCOSE, CAPILLARY
GLUCOSE-CAPILLARY: 142 mg/dL — AB (ref 65–99)
GLUCOSE-CAPILLARY: 145 mg/dL — AB (ref 65–99)
GLUCOSE-CAPILLARY: 156 mg/dL — AB (ref 65–99)
GLUCOSE-CAPILLARY: 168 mg/dL — AB (ref 65–99)
GLUCOSE-CAPILLARY: 186 mg/dL — AB (ref 65–99)
Glucose-Capillary: 162 mg/dL — ABNORMAL HIGH (ref 65–99)

## 2014-12-13 LAB — CBC
HEMATOCRIT: 36.6 % — AB (ref 39.0–52.0)
HEMOGLOBIN: 12.4 g/dL — AB (ref 13.0–17.0)
MCH: 30.1 pg (ref 26.0–34.0)
MCHC: 33.9 g/dL (ref 30.0–36.0)
MCV: 88.8 fL (ref 78.0–100.0)
Platelets: 189 10*3/uL (ref 150–400)
RBC: 4.12 MIL/uL — AB (ref 4.22–5.81)
RDW: 11.6 % (ref 11.5–15.5)
WBC: 11 10*3/uL — AB (ref 4.0–10.5)

## 2014-12-13 LAB — OSMOLALITY: OSMOLALITY: 280 mosm/kg (ref 275–300)

## 2014-12-13 MED ORDER — CLONIDINE HCL 0.1 MG PO TABS: 0.1000 mg | ORAL_TABLET | Freq: Three times a day (TID) | ORAL | Status: DC

## 2014-12-13 MED ORDER — DEXMEDETOMIDINE HCL IN NACL 400 MCG/100ML IV SOLN
0.4000 ug/kg/h | INTRAVENOUS | Status: DC
  Administered 2014-12-13: 0.4 ug/kg/h via INTRAVENOUS
  Administered 2014-12-13: 0.3 ug/kg/h via INTRAVENOUS
  Administered 2014-12-14 (×2): 0.6 ug/kg/h via INTRAVENOUS
  Filled 2014-12-13 (×4): qty 100

## 2014-12-13 MED ORDER — AMLODIPINE BESYLATE 10 MG PO TABS
10.0000 mg | ORAL_TABLET | Freq: Every day | ORAL | Status: DC
  Administered 2014-12-13 – 2014-12-16 (×4): 10 mg via ORAL
  Filled 2014-12-13 (×4): qty 1

## 2014-12-13 MED ORDER — POTASSIUM CHLORIDE 20 MEQ/15ML (10%) PO SOLN
40.0000 meq | ORAL | Status: AC
  Administered 2014-12-13 (×3): 40 meq via ORAL
  Filled 2014-12-13 (×3): qty 30

## 2014-12-13 MED ORDER — LACTATED RINGERS IV SOLN
INTRAVENOUS | Status: DC
  Administered 2014-12-13: 16:00:00 via INTRAVENOUS

## 2014-12-13 MED ORDER — HYDRALAZINE HCL 20 MG/ML IJ SOLN
5.0000 mg | INTRAMUSCULAR | Status: DC | PRN
  Administered 2014-12-13: 5 mg via INTRAVENOUS
  Filled 2014-12-13: qty 1

## 2014-12-13 MED ORDER — INFLUENZA VAC SPLIT QUAD 0.5 ML IM SUSY
0.5000 mL | PREFILLED_SYRINGE | INTRAMUSCULAR | Status: AC
  Administered 2014-12-14: 0.5 mL via INTRAMUSCULAR
  Filled 2014-12-13: qty 0.5

## 2014-12-13 MED ORDER — PNEUMOCOCCAL VAC POLYVALENT 25 MCG/0.5ML IJ INJ
0.5000 mL | INJECTION | INTRAMUSCULAR | Status: AC
  Administered 2014-12-14: 0.5 mL via INTRAMUSCULAR
  Filled 2014-12-13: qty 0.5

## 2014-12-13 MED ORDER — HYDRALAZINE HCL 25 MG PO TABS: 25.0000 mg | ORAL_TABLET | Freq: Three times a day (TID) | ORAL | Status: DC

## 2014-12-13 MED ORDER — POTASSIUM CHLORIDE CRYS ER 20 MEQ PO TBCR: 40.0000 meq | EXTENDED_RELEASE_TABLET | ORAL | Status: DC

## 2014-12-13 NOTE — Progress Notes (Signed)
eLink Physician-Brief Progress Note Patient Name: Richard [name] DOB: [birth year] MRN: [id number]   Date of Service  12/13/2014  HPI/Events of Note  Notifed by RN that UOP marginal. Serum Osm pending.   eICU Interventions  Start LR 75cc/hr while waiting on Osm.     Intervention Category Intermediate Interventions: Oliguria - evaluation and management  Lawanda Cousins 12/13/2014, 3:22 PM

## 2014-12-13 NOTE — Progress Notes (Signed)
Initial Nutrition Assessment  DOCUMENTATION CODES:  Not applicable  INTERVENTION:  In unable to extubate and medically able recommend TF: Vital AF 1.2 @ 25 ml/hr via OG/NGT and increase by 10 ml every 4 hours to goal rate of 40 ml/hr.   30 ml prostat daily  Tube feeding regimen provides 2116 kcal (102% of needs), 141 grams of protein, and 1362 ml of H2O.   NUTRITION DIAGNOSIS:  Inadequate oral intake related to inability to eat as evidenced by NPO status.  GOAL:  Patient will meet greater than or equal to 90% of their needs  MONITOR:  Vent status, Labs, I & O's, TF tolerance  REASON FOR ASSESSMENT:  Ventilator    ASSESSMENT:  [name] PMHx: DM, stroke without residual deficits. Pt was evaluated after found to have trouble speaking fluently. CT shows left frontal ICH. Pt has had persistent encephalopathy and yesterday was intubated due to snoring respirations causing hypotension and sats of 85%  Pt intubated with no family/friends present to obtain hx from.   NFPE-mild-mod temporal wasting  Patient is currently intubated on ventilator support MV: 7.4 L/min Temp (24hrs), Avg:98.8 F (37.1 C), Min:97.9 F (36.6 C), Max:99.7 F (37.6 C)  Propofol: None  Diet Order:  Diet NPO time specified  Skin:  Reviewed, no issues  Last BM:  Unknown  Height:  Ht Readings from Last 1 Encounters:  12/10/14  (1.88 m)   Weight:  Wt Readings from Last 1 Encounters:  12/10/14 225 lb (102.059 kg)   Ideal Body Weight:  86.36 kg  BMI:  Body mass index is 28.88 kg/(m^2).  Estimated Nutritional Needs:  Kcal:  2082 kcal  Protein:  130-147 (1.5-1.7 g/kg IBW) Fluid:  1.8-2 liters  EDUCATION NEEDS:  No education needs identified at this time  Christophe Louis RD, LDN Nutrition Pager: [phone] 12/13/2014 9:46 AM

## 2014-12-13 NOTE — Progress Notes (Signed)
SLP Cancellation Note  Patient Details Name: Jaaziel Peatross MRN: [id number] DOB: 1946/01/27   Cancelled treatment:       Reason Eval/Treat Not Completed: Medical issues which prohibited therapy (pt now intubated).   Maxcine Ham, M.A. CCC-SLP [phone]  Maxcine Ham 12/13/2014, 8:29 AM

## 2014-12-13 NOTE — Progress Notes (Signed)
eLink Physician-Brief Progress Note Patient Name: Urijah Arko DOB: 1945-04-08 MRN: [id number]   Date of Service  12/13/2014  HPI/Events of Note  RN notified of 3rd loose BM. No fever. Not currently on antibiotics.  eICU Interventions  RN to notify if diarrhea worsens before we check C diff.     Intervention Category Minor Interventions: Communication with other healthcare providers and/or family  Lawanda Cousins 12/13/2014, 6:12 PM

## 2014-12-13 NOTE — Progress Notes (Signed)
eLink Physician-Brief Progress Note Patient Name: Richard [name] DOB: 29-Nov-1945 MRN: [id number]   Date of Service  12/13/2014  HPI/Events of Note  RN notified continuing copious watery diarrhea.  eICU Interventions  C diff w/ reflex pcr     Intervention Category Minor Interventions: Other:  Lawanda Cousins 12/13/2014, 9:25 PM

## 2014-12-13 NOTE — Progress Notes (Signed)
STROKE TEAM PROGRESS NOTE   SUBJECTIVE (INTERVAL HISTORY) No family is at the bedside. Pt was not able to protect his airway and was intubated yesterday. CT repeat no change and BP high on cleviprix. Also on precedex. Na stable at 130.   OBJECTIVE Temp:  [97.9 F (36.6 C)-99.3 F (37.4 C)] 98.6 F (37 C) (09/17 1200) Pulse Rate:  [57-95] 62 (09/17 1108) Cardiac Rhythm:  [-] Normal sinus rhythm (09/17 1200) Resp:  [10-27] 12 (09/17 1108) BP: (54-178)/(38-90) 118/72 mmHg (09/17 1108) SpO2:  [92 %-100 %] 100 % (09/17 1108) FiO2 (%):  [40 %-100 %] 40 % (09/17 1108)  CBC:  Recent Labs Lab 12/10/14 0920  12/12/14 0236 12/13/14 0224  WBC 8.6  < > 8.5 11.0*  NEUTROABS 5.2  --   --   --   HGB 14.2  < > 13.0 12.4*  HCT 41.2  < > 38.9* 36.6*  MCV 89.6  < > 89.8 88.8  PLT 238  < > 210 189  < > = values in this interval not displayed.  Basic Metabolic Panel:   Recent Labs Lab 12/12/14 1630 12/13/14 0224  NA 130* 130*  K 3.8 3.3*  CL 97* 98*  CO2 22 21*  GLUCOSE 213* 155*  BUN 10 14  CREATININE 1.23 1.30*  CALCIUM 8.6* 8.4*    Lipid Panel:     Component Value Date/Time   CHOL 187 12/11/2014 0725   TRIG 218* 12/11/2014 0725   HDL 41 12/11/2014 0725   CHOLHDL 4.6 12/11/2014 0725   VLDL 44* 12/11/2014 0725   LDLCALC 102* 12/11/2014 0725   HgbA1c:  Lab Results  Component Value Date   HGBA1C 7.5* 12/11/2014   Urine Drug Screen:     Component Value Date/Time   LABOPIA NONE DETECTED 12/11/2014 1307   COCAINSCRNUR NONE DETECTED 12/11/2014 1307   LABBENZ NONE DETECTED 12/11/2014 1307   AMPHETMU NONE DETECTED 12/11/2014 1307   THCU NONE DETECTED 12/11/2014 1307   LABBARB NONE DETECTED 12/11/2014 1307      IMAGING I have personally reviewed the radiological images below and agree with the radiology interpretations.  Ct Head Wo Contrast 12/10/2014    1. Acute left frontal hemorrhage with surrounding edema, likely representing atypical hemorrhagic stroke. A  hemorrhagic mass is considered less likely. CT or MR follow up suggested.  2. Confluent low-density in the periventricular and subcortical white matter consistent with chronic small vessel ischemic changes.   12/11/2014   IMPRESSION: Unchanged left frontal parenchymal hematoma, approximately 12 cc volume.    12/12/2014    IMPRESSION: Stable LEFT posterior frontal intracerebral hematoma, without significant extension or worsening midline shift. Chronic changes also stable.     Dg Chest Port 1 View  12/11/2014   IMPRESSION: Low lung volumes with no radiographic evidence of acute cardiopulmonary disease  Mr [name] Wo Contrast 12/10/2014    Acute LEFT posterior frontal intracerebral hemorrhage 30 x 24 x 32 mm, with estimated volume 12 mL.  Mild surrounding edema. This is favored to represent a lobar hypertensive bleed.  Cerebral amyloid angiopathy is not excluded. Doubt embolic infarction or hemorrhagic tumor.  No sign suggestive of vascular malformation or cortical venous thrombosis.   Moderate atrophy with extensive white matter signal abnormality, likely chronic microvascular ischemic change.     2D echo - - Left ventricle: The cavity size was normal. Wall thickness was increased in a pattern of mild LVH. Systolic function was normal. The estimated ejection fraction was in the range  of 60% to 65%. Wall motion was normal; there were no regional wall motion abnormalities. Doppler parameters are consistent with abnormal left ventricular relaxation (grade 1 diastolic dysfunction). The E/e&' ratio is <8, suggesting normal LV filling pressure. - Left atrium: The atrium was normal in size. - Right atrium: The atrium was mildly dilated. Impressions: - LVEF 60-65%, mild LVH, normal wall motion, diastolic dysfunction,normal LV filling pressure, mild RAE.  CUS - Findings suggest 1-39% internal carotid artery stenosis of visualized segments bilaterally. Vertebral arteries are patent  with antegrade flow.  EEG pending   PHYSICAL EXAM  Temp:  [97.9 F (36.6 C)-99.3 F (37.4 C)] 98.6 F (37 C) (09/17 1200) Pulse Rate:  [57-95] 62 (09/17 1108) Resp:  [10-27] 12 (09/17 1108) BP: (54-178)/(38-90) 118/72 mmHg (09/17 1108) SpO2:  [92 %-100 %] 100 % (09/17 1108) FiO2 (%):  [40 %-100 %] 40 % (09/17 1108)  General - Well nourished, well developed, intubated, not following commands.  Ophthalmologic - Fundi not visualized due to noncooperation.  Cardiovascular - Regular rate and rhythm with no murmur.  Neuro - intubated and on precedex, briefly open right eye on repetitive stimulation, not following commands. Pupil reactive to light, right facial droop, right UE trace withdraw on pain stimulation, LUE localizing to pain, BLE withdraw to pain at least 2/5 strength. DTR 1+, no babinski.  ASSESSMENT/PLAN [name] is a 69 y.o. male with history of DM, previous stroke without residual deficits, s/p artificial left eye, presenting with aphasia and right facial weakness. He did not receive IV t-PA due to ICH.  Stroke: lobar left frontal ICH, likely hypertensive, although CAA not excluded.  Resultant  Aphasia, right hemiparesis.  MRI - Acute LEFT posterior frontal ICH, 12-15 mL.   MRA - no evidence of AVM or CAA.  Repeat CT showed stable hematoma  Carotid Doppler - unremarkable  2D Echo - unremarkable  LDL 102  HgbA1c 7.5  VTE prophylaxis - heparin subq  Diet NPO time specified  no antithrombotic prior to admission, now on no antithrombotic secondary to left frontal ICH.  Ongoing aggressive stroke risk factor management  Therapy recommendations: Pending  Disposition: Pending  AMS - unclear etiology, could be alcohol withdraw  On CIWA protocol  On precedex  No aspiration on CXR  CT repeat no change  Currently intubated  CCM on board  Hyponatremia  On admission sodium 136-137  Na 128->130  Urine Na 195, urine osmolarity  739  Concerning for SIADH vs CSW  On salt tablet 1g tid  CCM on board  Alcohol withdrawal  Heavy drinker as per wife  On CIWA protocol  On precedex  CCM on board  On B1, FA, multivitamin  Hypertension, accelerated  Blood pressure high at times.  Home meds - coreg, lisinopril, benicar-HTCZ  Off cleviprix drip for BP control.  HR low d/c coreg and did not put on clonidine, Cre elevated did not resume lisinopril or HTCZ, too sensitive to hydralazine  On amlodipine 10mg  daily  BP goal < 160  Hyperlipidemia  Home meds:  resumed in hospital  LDL 102, goal < 70  On lipitor 10mg .  Continue statin at discharge  Diabetes  HgbA1c 7.5, goal < 7.0  Uncontrolled  SSI  Home meds include insulin subq  CBG monitoring  Other Stroke Risk Factors  Advanced age  Hx stroke/TIA   Other Active Problems  Low B12 - on supplement  Left eye artificial  Hospital day # 3  This patient is critically ill  due to ICH, HTN, hyponatremia and heavy alcohol drinker and at significant risk of neurological worsening, death form recurrent bleeding, cerebral edema and brain herniation, alcohol withdrawal seizure, DT. Currently intubated. This patient's care requires constant monitoring of vital signs, hemodynamics, respiratory and cardiac monitoring, review of multiple databases, neurological assessment, discussion with family, other specialists and medical decision making of high complexity. I spent 35 minutes of neurocritical care time in the care of this patient.   Marvel Plan, MD PhD Stroke Neurology 12/13/2014 12:23 PM    To contact Stroke Continuity provider, please refer to [email or web address]. After hours, contact General Neurology

## 2014-12-13 NOTE — Progress Notes (Signed)
Name: Richard [name] MRN: [id number] DOB: 1946/02/12    ADMISSION DATE:  12/10/2014 CONSULTATION DATE:  12/13/2014  REFERRING MD :  Roda Shutters, neuro   CHIEF COMPLAINT:  Altered MS  BRIEF PATIENT DESCRIPTION: 69 year old diabetic, history of CVA presented 9/14 with aphasia and right-sided weakness, CT showed left frontal ICH. He remained encephalopathic, placed on Ativan per CIWA protocol for agitation, [facility] consulted 9/16 for hyponatremia and persistent encephalopathy  SIGNIFICANT EVENTS  9/16 CIR consult  STUDIES:  9/14 head CT-3.52.5 cm left frontal hemorrhage 9/14 MRI- 3 cm left frontal hemorrhage 9/15 echo-normal LV function 9/16 head CT stable left frontal hematoma  SUBJECTIVE:   VITAL SIGNS: Temp:  [97.9 F (36.6 C)-99.7 F (37.6 C)] 99 F (37.2 C) (09/17 0800) Pulse Rate:  [57-95] 57 (09/17 0814) Resp:  [10-27] 11 (09/17 0814) BP: (54-182)/(38-97) 141/77 mmHg (09/17 0814) SpO2:  [92 %-100 %] 100 % (09/17 0814) FiO2 (%):  [40 %-100 %] 40 % (09/17 0814)  PHYSICAL EXAMINATION: Gen. well-nourished, intubated and sedated ENT - no lesions, no post nasal drip, left artificial eye, right cataract-reactive to light, ETT inplace Neck: No JVD, no thyromegaly, no carotid bruits Lungs: no use of accessory muscles, clear without rales or rhonchi  Cardiovascular: Rhythm regular, heart sounds  normal, no murmurs, no peripheral edema Abdomen: soft and non-tender BS normal. Musculoskeletal: No deformities, no cyanosis or clubbing Neuro: Sedated,  Skin:  Warm, no lesions/ rash    Recent Labs Lab 12/12/14 1500 12/12/14 1630 12/13/14 0224  NA 130* 130* 130*  K 3.5 3.8 3.3*  CL 97* 97* 98*  CO2 24 22 21*  BUN CREATININE 1.26* 1.23 1.30*  GLUCOSE 198* 213* 155*    Recent Labs Lab 12/11/14 0725 12/12/14 0236 12/13/14 0224  HGB 13.4 13.0 12.4*  HCT 38.9* 38.9* 36.6*  WBC 9.0 8.5 11.0*  PLT 221 210 189   Ct Head Wo Contrast  12/12/2014   CLINICAL DATA:  Change  in mental status.  Patient now nonverbal.  EXAM: CT HEAD WITHOUT CONTRAST  TECHNIQUE: Contiguous axial images were obtained from the base of the skull through the vertex without intravenous contrast.  COMPARISON:  12/11/2014.  FINDINGS: Redemonstrated is a LEFT posterior frontal intracerebral hematoma, mild surrounding edema, cross-section of 34 x 27 mm, similar in appearance to the most recent prior CT from 09/15. Slight midline shift LEFT to RIGHT as measured at the septum pellucidum, of approximately 2 mm, also stable. Mild atrophy. Extensive chronic microvascular ischemic change throughout the white matter. Scattered areas of chronic cortical infarction and lacunar infarction. Calvarium intact. LEFT ocular prosthesis. LEFT maxillary sinus disease. Vascular calcification.  IMPRESSION: Stable LEFT posterior frontal intracerebral hematoma, without significant extension or worsening midline shift. Chronic changes also stable.   Electronically Signed   By: Elsie Stain M.D.   On: 12/12/2014 10:14   Ct Head Wo Contrast  12/11/2014   CLINICAL DATA:  Follow-up left intracranial hemorrhage  EXAM: CT HEAD WITHOUT CONTRAST  TECHNIQUE: Contiguous axial images were obtained from the base of the skull through the vertex without intravenous contrast.  COMPARISON:  Yesterday  FINDINGS: Skull and Sinuses:Chronic appearing sinusitis bilaterally. No acute finding.  Orbits: Left enucleation with prosthesis.  Brain: Unchanged peripheral hemispheric hematoma in the posterior left frontal region measuring up to 35 mm. As previously calculated, hematoma volume is approximately 12 cc. Rim of surrounding edema is stable and there is no progressive mass effect. No new site of hemorrhage. No subarachnoid  or intraventricular extension. Extensive chronic small vessel disease with gliosis throughout the bilateral cerebral white matter. No newly seen infarct.  IMPRESSION: Unchanged left frontal parenchymal hematoma, approximately 12 cc  volume.   Electronically Signed   By: Marnee Spring M.D.   On: 12/11/2014 11:53   Dg Chest Portable 1 View  12/12/2014   CLINICAL DATA:  Encounter for intubation.  EXAM: PORTABLE CHEST - 1 VIEW  COMPARISON:  One-view chest 12/11/2014.  FINDINGS: The heart size is normal. The patient has been intubated. The endotracheal tube terminates 4.8 cm above the carina, in satisfactory position. An NG tube courses off the inferior border of the film. The lungs are clear. Inflation is improved. The visualized soft tissues and bony thorax are unremarkable.  IMPRESSION: 1. Interval intubation without radiographic evidence for complication. 2. Improved aeration without focal airspace disease.   Electronically Signed   By: Marin Roberts M.D.   On: 12/12/2014 16:22   Dg Abd Portable 1v  12/12/2014   CLINICAL DATA:  Nasogastric tube placement.  EXAM: PORTABLE ABDOMEN - 1 VIEW  COMPARISON:  None.  FINDINGS: The NG tube tip is in the region of the descending duodenum. Scattered air throughout the colon. No obvious free air.  IMPRESSION: The NG tube tip is in the region of the descending duodenum.   Electronically Signed   By: Rudie Meyer M.D.   On: 12/12/2014 17:36    ASSESSMENT / PLAN:  Acute encephalopathy- multifactorial, related to ICH, alcohol withdrawal and Ativan, and hyponatremia  EtOH withdrawal- use Precedex, this will help [place] titrate to his exact needs and avoid oversedation with Ativan  Hyponatremia- sodium was 136 on 9/14, now dropped to 128 - will  use normal saline-If drops lower than 125 and mental status remains poor will add 3% saline  PCCM to follow    ASSESSMENT / PLAN:  PULMONARY OETT 9/16 >>  A: Acute respiratory failure  P:   - continue supportive MV, primarily for airway protection - PSV as able but defer extubation until encephalopathy improved  CARDIOVASCULAR A: Hx hypertension P:  - on amlodipine 10 - lipitor - prn labetalol - home coreg, lisinopril, benicar (?  Redundant) all on hold for now  RENAL A:  Hyponatremia  FENa 1.09% P:   - Follow BMP - check serum osm to guide volume (2.5L positive for hosp)  GASTROINTESTINAL A:  Nutrition  SUP P:   - start TF next 24 h if no plan extubation - PPI  HEMATOLOGIC A:  DVT prophylaxis P:  - heparin sq  INFECTIOUS A:  No evidence infection  P:    - following off abx  ENDOCRINE A:  Hyperglycemia / DM   P:   - insulin SS protocol  NEUROLOGIC A:  Acute encephalopathy, multifactorial due to ICH, EtOH withdrawal, hyponatremia, meds / ativan  P:   RASS goal: 0 - continue precedex and minimize other sedation if possible.  - correct electrolytes - repeat head Ct 9/16 10am reassuring, no evolution of bleed  - folate, thiamine   FAMILY  - Updates: no family present 9/17  - Inter-disciplinary family meet or Palliative Care meeting due by: 12/17/14   Independent CC time 40 minutes.    Levy Pupa, MD, PhD 12/13/2014, 9:39 AM [facility] [phone] or if no answer [phone]

## 2014-12-13 NOTE — Procedures (Signed)
ELECTROENCEPHALOGRAM REPORT  Patient: Richard [name]       Room #: 1O10  EEG No. ID: [id number] Age: 68 y.o.        Sex: male Referring Physician: Jerel Shepherd Report Date:  12/13/2014        Interpreting Physician: Aline Brochure  History: Richard [name] is an 68 y.o. male history of previous stroke admitted on 12/10/2014 with acute aphasia and right hemiparesthesias. Hyperventilation has remained encephalopathic and has also been experiencing myoclonic-like jerks.  Indications for study:  Assess severity of encephalopathy; rule out seizure activity.  Technique: This is an 18 channel routine scalp EEG performed at the bedside with bipolar and monopolar montages arranged in accordance to the international 10/20 system of electrode placement.   Description: Patient was intubated and on mechanical ventilation at the time of this EEG study. He was also sedated with fentanyl. Predominant background activity consisted of diffuse low to moderate amplitude continuous delta and theta activity. There were numerous occurrences of myoclonic-like jerks noted with no associated change in background cerebral activity. Photic stimulation was not performed. No epileptiform discharges were recorded.  Interpretation: This EEG is abnormal with moderately severe generalized continuous nonspecific slowing of cerebral activity. This pattern of slowing can be seen with metabolic and toxic encephalopathies as well as degenerative central nervous system disorders. No evidence of seizure activity was recorded.   Venetia Maxon M.D. [facility] [phone]

## 2014-12-13 NOTE — Progress Notes (Signed)
eLink Physician-Brief Progress Note Patient Name: Richard [name] DOB: [birth year] MRN: [id number]   Date of Service  12/13/2014  HPI/Events of Note  Spoke w/ bedside RN. Patient having approximately 30cc/hr UOP. Serum Osm 280.  eICU Interventions  D/C LR & saline lock.     Intervention Category Intermediate Interventions: Oliguria - evaluation and management  Lawanda Cousins 12/13/2014, 8:22 PM

## 2014-12-14 ENCOUNTER — Inpatient Hospital Stay (HOSPITAL_COMMUNITY): Payer: Medicare HMO

## 2014-12-14 DIAGNOSIS — F10230 Alcohol dependence with withdrawal, uncomplicated: Secondary | ICD-10-CM

## 2014-12-14 DIAGNOSIS — I611 Nontraumatic intracerebral hemorrhage in hemisphere, cortical: Secondary | ICD-10-CM | POA: Diagnosis not present

## 2014-12-14 DIAGNOSIS — F10939 Alcohol use, unspecified with withdrawal, unspecified: Secondary | ICD-10-CM | POA: Insufficient documentation

## 2014-12-14 DIAGNOSIS — F10239 Alcohol dependence with withdrawal, unspecified: Secondary | ICD-10-CM | POA: Insufficient documentation

## 2014-12-14 LAB — C DIFFICILE QUICK SCREEN W PCR REFLEX
C Diff antigen: NEGATIVE
C Diff interpretation: NEGATIVE
C Diff toxin: NEGATIVE

## 2014-12-14 LAB — GLUCOSE, CAPILLARY
GLUCOSE-CAPILLARY: 161 mg/dL — AB (ref 65–99)
GLUCOSE-CAPILLARY: 142 mg/dL — AB (ref 65–99)
GLUCOSE-CAPILLARY: 129 mg/dL — AB (ref 65–99)
GLUCOSE-CAPILLARY: 114 mg/dL — AB (ref 65–99)
GLUCOSE-CAPILLARY: 128 mg/dL — AB (ref 65–99)
Glucose-Capillary: 141 mg/dL — ABNORMAL HIGH (ref 65–99)

## 2014-12-14 LAB — BASIC METABOLIC PANEL
Anion gap: 11 (ref 5–15)
Anion gap: 13 (ref 5–15)
BUN: 18 mg/dL (ref 6–20)
BUN: 15 mg/dL (ref 6–20)
CALCIUM: 9 mg/dL (ref 8.9–10.3)
CHLORIDE: 105 mmol/L (ref 101–111)
CHLORIDE: 101 mmol/L (ref 101–111)
CO2: 20 mmol/L — AB (ref 22–32)
CO2: 19 mmol/L — ABNORMAL LOW (ref 22–32)
CREATININE: 1.27 mg/dL — AB (ref 0.61–1.24)
CREATININE: 1.1 mg/dL (ref 0.61–1.24)
Calcium: 8.4 mg/dL — ABNORMAL LOW (ref 8.9–10.3)
GFR calc Af Amer: >60 mL/min (ref >60)
GFR calc Af Amer: >60 mL/min (ref >60)
GFR calc non Af Amer: 56 mL/min — ABNORMAL LOW (ref >60)
GLUCOSE: 153 mg/dL — AB (ref 65–99)
GLUCOSE: 127 mg/dL — AB (ref 65–99)
Potassium: 4.4 mmol/L (ref 3.5–5.1)
Potassium: 3.7 mmol/L (ref 3.5–5.1)
SODIUM: 133 mmol/L — AB (ref 135–145)
Sodium: 136 mmol/L (ref 135–145)

## 2014-12-14 NOTE — Procedures (Signed)
Extubation Procedure Note  Patient Details:   Name: Asaf Elmquist DOB: 06-07-1945 MRN: [id number]   Pt extubated to 4L [place] per MD order, VS WNL. Pt able to vocalize, no stridor noted. Pt tolerating well at this time. RT will continue to  Monitor.    Evaluation  O2 sats: stable throughout Complications: No apparent complications Patient did tolerate procedure well. Bilateral Breath Sounds: Diminished Suctioning: Oral, Airway Yes  Harley Hallmark 12/14/2014, 12:18 PM

## 2014-12-14 NOTE — Progress Notes (Signed)
eLink Physician-Brief Progress Note Patient Name: Richard [name] DOB: June 29, 1945 MRN: [id number]   Date of Service  12/14/2014  HPI/Events of Note  Bal with no org seen on gm stain growing pseudomonas pansensitive  eICU Interventions  Hold abx for purulent sputum, fever, as dz on cxr      Intervention Category Major Interventions: Respiratory failure - evaluation and management Intermediate Interventions: Infection - evaluation and management  Sandrea Hughs 12/14/2014, 3:19 PM

## 2014-12-14 NOTE — Progress Notes (Signed)
Name: Richard [name] MRN: [id number] DOB: 1945/09/22    ADMISSION DATE:  12/10/2014 CONSULTATION DATE:  12/14/2014  REFERRING MD :  Roda Shutters, neuro   CHIEF COMPLAINT:  Altered MS  BRIEF PATIENT DESCRIPTION: 69 year old diabetic, history of CVA presented 9/14 with aphasia and right-sided weakness, CT showed left frontal ICH. He remained encephalopathic, placed on Ativan per CIWA protocol for agitation, [facility] consulted 9/16 for hyponatremia and persistent encephalopathy  SIGNIFICANT EVENTS  9/16 CIR consult  STUDIES:  9/14 head CT-3.52.5 cm left frontal hemorrhage 9/14 MRI- 3 cm left frontal hemorrhage 9/15 echo-normal LV function 9/16 head CT stable left frontal hematoma  SUBJECTIVE:  precedex has been 0.3 - 0.6,  Note serum osm and IVF changes  VITAL SIGNS: Temp:  [97.4 F (36.3 C)-99 F (37.2 C)] 97.4 F (36.3 C) (09/18 0400) Pulse Rate:  [52-67] 52 (09/18 0700) Resp:  [11-17] 14 (09/18 0700) BP: (103-166)/(66-87) 157/85 mmHg (09/18 0700) SpO2:  [99 %-100 %] 100 % (09/18 0700) FiO2 (%):  [30 %-40 %] 30 % (09/17 2354)  PHYSICAL EXAMINATION: Gen. well-nourished, intubated and sedated ENT - no lesions, no post nasal drip, left artificial eye, right cataract-reactive to light, ETT in place Neck: No JVD, no thyromegaly, no carotid bruits Lungs: no use of accessory muscles, clear without rales or rhonchi  Cardiovascular: Rhythm regular, heart sounds  normal, no murmurs, no peripheral edema Abdomen: soft and non-tender BS normal. Musculoskeletal: No deformities, no cyanosis or clubbing Neuro: Sedated, turns to voice, tries to open eyes Skin:  Warm, no lesions/ rash    Recent Labs Lab 12/12/14 1630 12/13/14 0224 12/14/14 0227  NA 130* 130* 136  K 3.8 3.3* 4.4  CL 97* 98* 105  CO2 22 21* 20*  BUN CREATININE 1.23 1.30* 1.27*  GLUCOSE 213* 155* 153*    Recent Labs Lab 12/11/14 0725 12/12/14 0236 12/13/14 0224  HGB 13.4 13.0 12.4*  HCT 38.9* 38.9* 36.6*    WBC 9.0 8.5 11.0*  PLT 221 210 189   Ct Head Wo Contrast  12/12/2014   CLINICAL DATA:  Change in mental status.  Patient now nonverbal.  EXAM: CT HEAD WITHOUT CONTRAST  TECHNIQUE: Contiguous axial images were obtained from the base of the skull through the vertex without intravenous contrast.  COMPARISON:  12/11/2014.  FINDINGS: Redemonstrated is a LEFT posterior frontal intracerebral hematoma, mild surrounding edema, cross-section of 34 x 27 mm, similar in appearance to the most recent prior CT from 09/15. Slight midline shift LEFT to RIGHT as measured at the septum pellucidum, of approximately 2 mm, also stable. Mild atrophy. Extensive chronic microvascular ischemic change throughout the white matter. Scattered areas of chronic cortical infarction and lacunar infarction. Calvarium intact. LEFT ocular prosthesis. LEFT maxillary sinus disease. Vascular calcification.  IMPRESSION: Stable LEFT posterior frontal intracerebral hematoma, without significant extension or worsening midline shift. Chronic changes also stable.   Electronically Signed   By: Elsie Stain M.D.   On: 12/12/2014 10:14   Dg Chest Portable 1 View  12/12/2014   CLINICAL DATA:  Encounter for intubation.  EXAM: PORTABLE CHEST - 1 VIEW  COMPARISON:  One-view chest 12/11/2014.  FINDINGS: The heart size is normal. The patient has been intubated. The endotracheal tube terminates 4.8 cm above the carina, in satisfactory position. An NG tube courses off the inferior border of the film. The lungs are clear. Inflation is improved. The visualized soft tissues and bony thorax are unremarkable.  IMPRESSION: 1. Interval intubation without radiographic  evidence for complication. 2. Improved aeration without focal airspace disease.   Electronically Signed   By: Marin Roberts M.D.   On: 12/12/2014 16:22   Dg Abd Portable 1v  12/12/2014   CLINICAL DATA:  Nasogastric tube placement.  EXAM: PORTABLE ABDOMEN - 1 VIEW  COMPARISON:  None.  FINDINGS:  The NG tube tip is in the region of the descending duodenum. Scattered air throughout the colon. No obvious free air.  IMPRESSION: The NG tube tip is in the region of the descending duodenum.   Electronically Signed   By: Rudie Meyer M.D.   On: 12/12/2014 17:36    ASSESSMENT / PLAN:  Acute encephalopathy- multifactorial, related to ICH, alcohol withdrawal and Ativan, and hyponatremia  EtOH withdrawal- use Precedex, this will help [place] titrate to his exact needs and avoid oversedation with Ativan  Hyponatremia- sodium was 136 on 9/14, now dropped to 128 - will  use normal saline-If drops lower than 125 and mental status remains poor will add 3% saline  PCCM to follow    ASSESSMENT / PLAN:  PULMONARY OETT 9/16 >>  A: Acute respiratory failure  P:   - continue supportive MV, primarily for airway protection - PSV as able but defer extubation until encephalopathy improved  CARDIOVASCULAR A: Hx hypertension P:  - on amlodipine 10 - lipitor - prn labetalol - home coreg, lisinopril, benicar (? Redundant) all on hold for now  RENAL A:  Hyponatremia, Serum osm 9/17 low-normal and euvolemic.  FENa 1.09% P:   - would KVO IVF, d/c NaCL tablets and follow Na trend - Follow BMP, next pm 9/18   GASTROINTESTINAL A:  Nutrition  SUP P:   - will defer TF for now as he is approaching extubation, reconsider if he remains ventilated - PPI  HEMATOLOGIC A:  DVT prophylaxis P:  - heparin sq  INFECTIOUS A:  No evidence infection  P:   - following off abx  ENDOCRINE A:  Hyperglycemia / DM   P:   - insulin SS protocol  NEUROLOGIC A:  Acute encephalopathy, multifactorial due to ICH, EtOH withdrawal, hyponatremia, meds / ativan  P:   RASS goal: 0 - continue precedex and minimize other sedation if possible.  - correct electrolytes - repeat head Ct 9/16 10am reassuring, no evolution of bleed  - folate, thiamine   FAMILY  - Updates: no family present 9/17  -  Inter-disciplinary family meet or Palliative Care meeting due by: 12/17/14   Independent CC time 35 minutes.    Levy Pupa, MD, PhD 12/14/2014, 7:30 AM [facility] [phone] or if no answer [phone]

## 2014-12-14 NOTE — Progress Notes (Signed)
STROKE TEAM PROGRESS NOTE   SUBJECTIVE (INTERVAL HISTORY) No family is at the bedside. Pt was not able to protect his airway and was intubated 12/12/14. CT repeat no change and BP high on cleviprix. Also on precedex. Na stable at 136.Now following commands and Precedex being tapered   OBJECTIVE Temp:  [96.5 F (35.8 C)-98.9 F (37.2 C)] 96.5 F (35.8 C) (09/18 0814) Pulse Rate:  [52-67] 54 (09/18 0900) Cardiac Rhythm:  [-] Sinus bradycardia (09/18 0800) Resp:  [12-19] 19 (09/18 0900) BP: (108-166)/(69-87) 152/84 mmHg (09/18 0900) SpO2:  [99 %-100 %] 100 % (09/18 0900) FiO2 (%):  [30 %-40 %] 30 % (09/18 0900)  CBC:  Recent Labs Lab 12/10/14 0920  12/12/14 0236 12/13/14 0224  WBC 8.6  < > 8.5 11.0*  NEUTROABS 5.2  --   --   --   HGB 14.2  < > 13.0 12.4*  HCT 41.2  < > 38.9* 36.6*  MCV 89.6  < > 89.8 88.8  PLT 238  < > 210 189  < > = values in this interval not displayed.  Basic Metabolic Panel:   Recent Labs Lab 12/13/14 0224 12/14/14 0227  NA 130* 136  K 3.3* 4.4  CL 98* 105  CO2 21* 20*  GLUCOSE 155* 153*  BUN 14 18  CREATININE 1.30* 1.27*  CALCIUM 8.4* 9.0    Lipid Panel:     Component Value Date/Time   CHOL 187 12/11/2014 0725   TRIG 218* 12/11/2014 0725   HDL 41 12/11/2014 0725   CHOLHDL 4.6 12/11/2014 0725   VLDL 44* 12/11/2014 0725   LDLCALC 102* 12/11/2014 0725   HgbA1c:  Lab Results  Component Value Date   HGBA1C 7.5* 12/11/2014   Urine Drug Screen:     Component Value Date/Time   LABOPIA NONE DETECTED 12/11/2014 1307   COCAINSCRNUR NONE DETECTED 12/11/2014 1307   LABBENZ NONE DETECTED 12/11/2014 1307   AMPHETMU NONE DETECTED 12/11/2014 1307   THCU NONE DETECTED 12/11/2014 1307   LABBARB NONE DETECTED 12/11/2014 1307      IMAGING I have personally reviewed the radiological images below and agree with the radiology interpretations.  Ct Head Wo Contrast 12/10/2014    1. Acute left frontal hemorrhage with surrounding edema, likely  representing atypical hemorrhagic stroke. A hemorrhagic mass is considered less likely. CT or MR follow up suggested.  2. Confluent low-density in the periventricular and subcortical white matter consistent with chronic small vessel ischemic changes.   12/11/2014   IMPRESSION: Unchanged left frontal parenchymal hematoma, approximately 12 cc volume.    12/12/2014    IMPRESSION: Stable LEFT posterior frontal intracerebral hematoma, without significant extension or worsening midline shift. Chronic changes also stable.     Dg Chest Port 1 View  12/11/2014   IMPRESSION: Low lung volumes with no radiographic evidence of acute cardiopulmonary disease  Mr [name] Wo Contrast 12/10/2014    Acute LEFT posterior frontal intracerebral hemorrhage 30 x 24 x 32 mm, with estimated volume 12 mL.  Mild surrounding edema. This is favored to represent a lobar hypertensive bleed.  Cerebral amyloid angiopathy is not excluded. Doubt embolic infarction or hemorrhagic tumor.  No sign suggestive of vascular malformation or cortical venous thrombosis.   Moderate atrophy with extensive white matter signal abnormality, likely chronic microvascular ischemic change.     2D echo - - Left ventricle: The cavity size was normal. Wall thickness was increased in a pattern of mild LVH. Systolic function was normal. The estimated ejection  fraction was in the range of 60% to 65%. Wall motion was normal; there were no regional wall motion abnormalities. Doppler parameters are consistent with abnormal left ventricular relaxation (grade 1 diastolic dysfunction). The E/e&' ratio is <8, suggesting normal LV filling pressure. - Left atrium: The atrium was normal in size. - Right atrium: The atrium was mildly dilated. Impressions: - LVEF 60-65%, mild LVH, normal wall motion, diastolic dysfunction,normal LV filling pressure, mild RAE.  CUS - Findings suggest 1-39% internal carotid artery stenosis of visualized segments  bilaterally. Vertebral arteries are patent with antegrade flow.  EEG pending   PHYSICAL EXAM  Temp:  [96.5 F (35.8 C)-98.9 F (37.2 C)] 96.5 F (35.8 C) (09/18 0814) Pulse Rate:  [52-67] 54 (09/18 0900) Resp:  [12-19] 19 (09/18 0900) BP: (108-166)/(69-87) 152/84 mmHg (09/18 0900) SpO2:  [99 %-100 %] 100 % (09/18 0900) FiO2 (%):  [30 %-40 %] 30 % (09/18 0900)  General - Well nourished, well developed, intubated, not following commands.  Ophthalmologic - Fundi not visualized due to noncooperation.  Cardiovascular - Regular rate and rhythm with no murmur.  Neuro - intubated and on precedex, awakens easily,  following commands consistently. Pupil reactive to light, right facial droop,moves all 4 limbs well against gravity and no drift. DTR 1+, no babinski.  ASSESSMENT/PLAN Mr. Brenin Heidelberger is a 69 y.o. male with history of DM, previous stroke without residual deficits, s/p artificial left eye, presenting with aphasia and right facial weakness. He did not receive IV t-PA due to ICH.  Stroke: lobar left frontal ICH, likely hypertensive, although CAA not excluded.  Resultant  Aphasia, right hemiparesis.  MRI - Acute LEFT posterior frontal ICH, 12-15 mL.   MRA - no evidence of AVM or CAA.  Repeat CT showed stable hematoma  Carotid Doppler - unremarkable  2D Echo - unremarkable  LDL 102  HgbA1c 7.5  VTE prophylaxis - heparin subq  Diet NPO time specified  no antithrombotic prior to admission, now on no antithrombotic secondary to left frontal ICH.  Ongoing aggressive stroke risk factor management  Therapy recommendations: Pending  Disposition: Pending  AMS - unclear etiology, could be alcohol withdraw  On CIWA protocol  On precedex  No aspiration on CXR  CT repeat no change  Currently intubated  CCM on board  Hyponatremia  On admission sodium 136-137  Na 128->130  Urine Na 195, urine osmolarity 739   On salt tablet 1g tid  CCM on  board  Alcohol withdrawal  Heavy drinker as per wife  On CIWA protocol  On precedex  CCM on board  On B1, FA, multivitamin  Hypertension, accelerated  Blood pressure high at times.  Home meds - coreg, lisinopril, benicar-HTCZ  Off cleviprix drip for BP control.  HR low d/c coreg and did not put on clonidine, Cre elevated did not resume lisinopril or HTCZ, too sensitive to hydralazine  On amlodipine  daily  BP goal < 160  Hyperlipidemia  Home meds:  resumed in hospital  LDL 102, goal < 70  On lipitor .  Continue statin at discharge  Diabetes  HgbA1c 7.5, goal < 7.0  Uncontrolled  SSI  Home meds include insulin subq  CBG monitoring  Other Stroke Risk Factors  Advanced age  Hx stroke/TIA   Other Active Problems  Low B12 - on supplement  Left eye artificial  Hospital day # 4 Plan wean Precedex drip as tolerated and extubate perr PCCM if ok.Maintain strict BP control  This patient is critically  ill due to ICH, HTN, hyponatremia and heavy alcohol drinker and at significant risk of neurological worsening, death form recurrent bleeding, cerebral edema and brain herniation, alcohol withdrawal seizure, DT. Currently intubated. This patient's care requires constant monitoring of vital signs, hemodynamics, respiratory and cardiac monitoring, review of multiple databases, neurological assessment, discussion with family, other specialists and medical decision making of high complexity. I spent 30 minutes of neurocritical care time in the care of this patient.   Delia Heady, MD Stroke Neurology 12/14/2014 10:20 AM    To contact Stroke Continuity provider, please refer to [email or web address]. After hours, contact General Neurology

## 2014-12-15 LAB — BASIC METABOLIC PANEL
ANION GAP: 14 (ref 5–15)
BUN: 12 mg/dL (ref 6–20)
CALCIUM: 8.8 mg/dL — AB (ref 8.9–10.3)
CO2: 17 mmol/L — AB (ref 22–32)
CREATININE: 1.17 mg/dL (ref 0.61–1.24)
Chloride: 104 mmol/L (ref 101–111)
Glucose, Bld: 120 mg/dL — ABNORMAL HIGH (ref 65–99)
Potassium: 4.2 mmol/L (ref 3.5–5.1)
SODIUM: 135 mmol/L (ref 135–145)

## 2014-12-15 LAB — GLUCOSE, CAPILLARY
GLUCOSE-CAPILLARY: 133 mg/dL — AB (ref 65–99)
GLUCOSE-CAPILLARY: 304 mg/dL — AB (ref 65–99)
Glucose-Capillary: 134 mg/dL — ABNORMAL HIGH (ref 65–99)
Glucose-Capillary: 140 mg/dL — ABNORMAL HIGH (ref 65–99)
Glucose-Capillary: 149 mg/dL — ABNORMAL HIGH (ref 65–99)
Glucose-Capillary: 224 mg/dL — ABNORMAL HIGH (ref 65–99)

## 2014-12-15 MED ORDER — SENNOSIDES-DOCUSATE SODIUM 8.6-50 MG PO TABS
1.0000 | ORAL_TABLET | Freq: Two times a day (BID) | ORAL | Status: DC
  Administered 2014-12-16: 1 via ORAL
  Filled 2014-12-15: qty 1

## 2014-12-15 MED ORDER — ACETAMINOPHEN 650 MG RE SUPP: 650.0000 mg | RECTAL | Status: DC | PRN

## 2014-12-15 MED ORDER — INSULIN NPH (HUMAN) (ISOPHANE) 100 UNIT/ML ~~LOC~~ SUSP
40.0000 [IU] | Freq: Every day | SUBCUTANEOUS | Status: DC
  Filled 2014-12-15: qty 10

## 2014-12-15 MED ORDER — IRBESARTAN 300 MG PO TABS
300.0000 mg | ORAL_TABLET | Freq: Every day | ORAL | Status: DC
  Administered 2014-12-16: 300 mg via ORAL
  Filled 2014-12-15: qty 1

## 2014-12-15 MED ORDER — FOLIC ACID 1 MG PO TABS
1.0000 mg | ORAL_TABLET | Freq: Every day | ORAL | Status: DC
  Administered 2014-12-16: 1 mg via ORAL
  Filled 2014-12-15: qty 1

## 2014-12-15 MED ORDER — INSULIN NPH (HUMAN) (ISOPHANE) 100 UNIT/ML ~~LOC~~ SUSP: 30.0000 [IU] | Freq: Every day | SUBCUTANEOUS | Status: DC

## 2014-12-15 MED ORDER — VITAMIN B-12 100 MCG PO TABS
100.0000 ug | ORAL_TABLET | Freq: Every day | ORAL | Status: DC
  Administered 2014-12-16: 100 ug via ORAL
  Filled 2014-12-15: qty 1

## 2014-12-15 MED ORDER — ACETAMINOPHEN 325 MG PO TABS
650.0000 mg | ORAL_TABLET | ORAL | Status: DC | PRN
  Administered 2014-12-15: 650 mg via ORAL
  Filled 2014-12-15: qty 2

## 2014-12-15 MED ORDER — VITAMIN B-1 100 MG PO TABS
100.0000 mg | ORAL_TABLET | Freq: Every day | ORAL | Status: DC
  Administered 2014-12-16: 100 mg via ORAL
  Filled 2014-12-15: qty 1

## 2014-12-15 MED ORDER — HYDROCHLOROTHIAZIDE 25 MG PO TABS
25.0000 mg | ORAL_TABLET | Freq: Every day | ORAL | Status: DC
  Administered 2014-12-16: 25 mg via ORAL
  Filled 2014-12-15: qty 1

## 2014-12-15 MED ORDER — ATORVASTATIN CALCIUM 10 MG PO TABS
10.0000 mg | ORAL_TABLET | Freq: Every day | ORAL | Status: DC
  Administered 2014-12-16: 10 mg via ORAL
  Filled 2014-12-15: qty 1

## 2014-12-15 MED ORDER — THIAMINE HCL 100 MG/ML IJ SOLN: 100.0000 mg | Freq: Every day | INTRAMUSCULAR | Status: DC

## 2014-12-15 MED ORDER — INSULIN GLARGINE 100 UNIT/ML ~~LOC~~ SOLN
10.0000 [IU] | Freq: Every day | SUBCUTANEOUS | Status: DC
  Administered 2014-12-15: 10 [IU] via SUBCUTANEOUS
  Filled 2014-12-15 (×3): qty 0.1

## 2014-12-15 MED ORDER — INSULIN ASPART 100 UNIT/ML ~~LOC~~ SOLN
0.0000 [IU] | SUBCUTANEOUS | Status: DC
  Administered 2014-12-15: 3 [IU] via SUBCUTANEOUS
  Administered 2014-12-15: 15 [IU] via SUBCUTANEOUS
  Administered 2014-12-15: 7 [IU] via SUBCUTANEOUS
  Administered 2014-12-16: 4 [IU] via SUBCUTANEOUS
  Administered 2014-12-16: 20 [IU] via SUBCUTANEOUS

## 2014-12-15 MED ORDER — ENSURE ENLIVE PO LIQD
237.0000 mL | Freq: Two times a day (BID) | ORAL | Status: DC
  Administered 2014-12-16 (×2): 237 mL via ORAL
  Filled 2014-12-15 (×4): qty 237

## 2014-12-15 MED ORDER — ADULT MULTIVITAMIN W/MINERALS CH
1.0000 | ORAL_TABLET | Freq: Every day | ORAL | Status: DC
  Administered 2014-12-16: 1 via ORAL
  Filled 2014-12-15: qty 1

## 2014-12-15 MED ORDER — OLMESARTAN MEDOXOMIL-HCTZ 40-25 MG PO TABS: 1.0000 | ORAL_TABLET | Freq: Every day | ORAL | Status: DC

## 2014-12-15 MED ORDER — INSULIN NPH (HUMAN) (ISOPHANE) 100 UNIT/ML ~~LOC~~ SUSP
30.0000 [IU] | Freq: Every day | SUBCUTANEOUS | Status: DC
  Filled 2014-12-15: qty 10

## 2014-12-15 NOTE — Evaluation (Signed)
Speech Language Pathology Evaluation Patient Details Name: Richard [name] MRN: [id number] DOB: Mar 19, 1946 Today's Date: 12/15/2014 Time: 0912-0929 SLP Time Calculation (min) (ACUTE ONLY): 17 min  Problem List:  Patient Active Problem List   Diagnosis Date Noted  . Alcohol withdrawal   . Cerebral hemorrhage   . Endotracheally intubated   . ICH (intracerebral hemorrhage) 12/10/2014   Past Medical History:  Past Medical History  Diagnosis Date  . Diabetes mellitus without complication   . Stroke    Past Surgical History: History reviewed. No pertinent past surgical history. HPI:  69 year old male with history of diabetes, CVA presented 9/14 with aphasia and right-sided weakness, CT showed left frontal ICH. Also with multifactorial encephalopathy related to ICH, alchohol withdrawl, hyponatremia. Intubated 9/16-9/18.    Assessment / Plan / Recommendation Clinical Impression  Re-evaluation of cognitive-linguistic function complete. Pt continues to present with a global aphasia, although with marked improvement in overall ability to functionally communicate as compared to initial evaluation. Right sided inattention persists. Patient able to follow basic commands with 75% accuracy in the context of a functional task, able to answer basic biographical Y/N questions with 100% accuracy, and communicate basic needs/wants effectively despite paraphasias, neologisms, and occassional perseversation. 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 continue to recommend SLP to address communication deficits. Recommend CIR consult.      SLP Assessment  Patient needs continued Speech Lanaguage Pathology Services    Follow Up Recommendations  Inpatient Rehab    Frequency and Duration min 3x week  2 weeks   Pertinent Vitals/Pain Pain Assessment: No/denies pain   SLP Goals  Potential to Achieve Goals (ACUTE ONLY): Good  SLP Evaluation Prior Functioning  Cognitive/Linguistic Baseline: Within functional limits   Cognition  Overall Cognitive Status: Impaired/Different from baseline Arousal/Alertness: Awake/alert Orientation Level: Oriented to person;Oriented to place;Oriented to time;Oriented to situation;Oriented X4 Attention: Sustained Focused Attention: Appears intact Sustained Attention: Appears intact Memory:  (TBD) Awareness: Impaired Awareness Impairment: Emergent impairment Problem Solving: Impaired Problem Solving Impairment: Functional complex Comments: difficult to fully assess due to aphasia    Comprehension  Auditory Comprehension Overall Auditory Comprehension: Impaired Yes/No Questions: Impaired Basic Biographical Questions: 76-100% accurate Basic Immediate Environment Questions: 75-100% accurate Complex Questions: 0-24% accurate Commands: Impaired One Step Basic Commands: 75-100% accurate Two Step Basic Commands: 0-24% accurate Conversation: Simple Other Conversation Comments: improved with contextual cues Visual Recognition/Discrimination Discrimination: Within Function Limits Common Objects: Able in field of 2 Reading Comprehension Reading Status: Not tested    Expression Expression Primary Mode of Expression: Verbal Verbal Expression Overall Verbal Expression: Impaired Initiation: No impairment Level of Generative/Spontaneous Verbalization: Sentence Repetition: No impairment Responsive: 0-25% accurate Confrontation: Impaired Common Objects: Able in field of 2 Convergent: 0-24% accurate Divergent: 0-24% accurate Verbal Errors: Neologisms;Phonemic paraphasias;Perseveration;Not aware of errors Pragmatics: No impairment Other Verbal Expression Comments: intelligibility/fluency improved in the context of functional conversation Written Expression Dominant Hand: Right Written Expression: Not tested   Oral / Motor Oral Motor/Sensory Function Overall Oral Motor/Sensory Function: Impaired (decreased CN VII  function on right. left eye ptosis baseline) Labial ROM: Reduced right Labial Symmetry: Abnormal symmetry right Labial Strength: Reduced Labial Sensation: Reduced Facial Symmetry: Right droop Facial Strength: Reduced Facial Sensation: Reduced Motor Speech Overall Motor Speech: Impaired Respiration: Within functional limits Phonation: Normal Resonance: Within functional limits Articulation: Impaired Level of Impairment: Word Intelligibility: Intelligibility reduced Word: 50-74% accurate Phrase: 50-74% accurate Sentence: 25-49% accurate Conversation: 25-49% accurate Motor Planning: Witnin functional limits   GO  Ferdinand Lango MA, CCC-SLP [phone]  Richard [name] Richard [name] 12/15/2014, 1:15 PM

## 2014-12-15 NOTE — Progress Notes (Signed)
STROKE TEAM PROGRESS NOTE   SUBJECTIVE (INTERVAL HISTORY) No family is at the bedside. Pt was extubated 12/14/14 and is doing well.     OBJECTIVE Temp:  [97.9 F (36.6 C)-98.6 F (37 C)] 98.1 F (36.7 C) (09/19 1121) Pulse Rate:  [78-99] 92 (09/19 1200) Cardiac Rhythm:  [-] Normal sinus rhythm (09/19 0800) Resp:  [13-31] 17 (09/19 1200) BP: (111-182)/(50-109) 141/77 mmHg (09/19 1200) SpO2:  [94 %-100 %] 100 % (09/19 1200)  CBC:  Recent Labs Lab 12/10/14 0920  12/12/14 0236 12/13/14 0224  WBC 8.6  < > 8.5 11.0*  NEUTROABS 5.2  --   --   --   HGB 14.2  < > 13.0 12.4*  HCT 41.2  < > 38.9* 36.6*  MCV 89.6  < > 89.8 88.8  PLT 238  < > 210 189  < > = values in this interval not displayed.  Basic Metabolic Panel:   Recent Labs Lab 12/14/14 1709 12/15/14 0214  NA 133* 135  K 3.7 4.2  CL 101 104  CO2 19* 17*  GLUCOSE 127* 120*  BUN 15 12  CREATININE 1.10 1.17  CALCIUM 8.4* 8.8*    Lipid Panel:     Component Value Date/Time   CHOL 187 12/11/2014 0725   TRIG 218* 12/11/2014 0725   HDL 41 12/11/2014 0725   CHOLHDL 4.6 12/11/2014 0725   VLDL 44* 12/11/2014 0725   LDLCALC 102* 12/11/2014 0725   HgbA1c:  Lab Results  Component Value Date   HGBA1C 7.5* 12/11/2014   Urine Drug Screen:     Component Value Date/Time   LABOPIA NONE DETECTED 12/11/2014 1307   COCAINSCRNUR NONE DETECTED 12/11/2014 1307   LABBENZ NONE DETECTED 12/11/2014 1307   AMPHETMU NONE DETECTED 12/11/2014 1307   THCU NONE DETECTED 12/11/2014 1307   LABBARB NONE DETECTED 12/11/2014 1307      IMAGING I have personally reviewed the radiological images below and agree with the radiology interpretations.  Ct Head Wo Contrast 12/10/2014    1. Acute left frontal hemorrhage with surrounding edema, likely representing atypical hemorrhagic stroke. A hemorrhagic mass is considered less likely. CT or MR follow up suggested.  2. Confluent low-density in the periventricular and subcortical white matter  consistent with chronic small vessel ischemic changes.   12/11/2014   IMPRESSION: Unchanged left frontal parenchymal hematoma, approximately 12 cc volume.    12/12/2014    IMPRESSION: Stable LEFT posterior frontal intracerebral hematoma, without significant extension or worsening midline shift. Chronic changes also stable.     Dg Chest Port 1 View  12/11/2014   IMPRESSION: Low lung volumes with no radiographic evidence of acute cardiopulmonary disease  Mr Richard [name] Wo Contrast 12/10/2014    Acute LEFT posterior frontal intracerebral hemorrhage 30 x 24 x 32 mm, with estimated volume 12 mL.  Mild surrounding edema. This is favored to represent a lobar hypertensive bleed.  Cerebral amyloid angiopathy is not excluded. Doubt embolic infarction or hemorrhagic tumor.  No sign suggestive of vascular malformation or cortical venous thrombosis.   Moderate atrophy with extensive white matter signal abnormality, likely chronic microvascular ischemic change.     2D echo - - Left ventricle: The cavity size was normal. Wall thickness was increased in a pattern of mild LVH. Systolic function was normal. The estimated ejection fraction was in the range of 60% to 65%. Wall motion was normal; there were no regional wall motion abnormalities. Doppler parameters are consistent with abnormal left ventricular relaxation (grade 1 diastolic dysfunction).  The E/e&' ratio is <8, suggesting normal LV filling pressure. - Left atrium: The atrium was normal in size. - Right atrium: The atrium was mildly dilated. Impressions: - LVEF 60-65%, mild LVH, normal wall motion, diastolic dysfunction,normal LV filling pressure, mild RAE.  CUS - Findings suggest 1-39% internal carotid artery stenosis of visualized segments bilaterally. Vertebral arteries are patent with antegrade flow.  EEG pending   PHYSICAL EXAM  Temp:  [97.9 F (36.6 C)-98.6 F (37 C)] 98.1 F (36.7 C) (09/19 1121) Pulse Rate:  [78-99] 92  (09/19 1200) Resp:  [13-31] 17 (09/19 1200) BP: (111-182)/(50-109) 141/77 mmHg (09/19 1200) SpO2:  [94 %-100 %] 100 % (09/19 1200)  General - Well nourished, well developed, elderly african american malefollowing commands.  Ophthalmologic - Fundi not visualized due to noncooperation.left eye is artificial  Cardiovascular - Regular rate and rhythm with no murmur.  Neuro -extubated awake y,  following commands consistently. Pupil reactive to light on right and left eye is prosthetic, right facial droop,moves all 4 limbs well against gravity and no drift. DTR 1+, no babinski.  ASSESSMENT/PLAN [name] is a 69 y.o. male with history of DM, previous stroke without residual deficits, s/p artificial left eye, presenting with aphasia and right facial weakness. He did not receive IV t-PA due to ICH.  Stroke: lobar left frontal ICH, likely hypertensive, although CAA not excluded.  Resultant  Aphasia, right hemiparesis.  MRI - Acute LEFT posterior frontal ICH, 12-15 mL.   MRA - no evidence of AVM or CAA.  Repeat CT showed stable hematoma  Carotid Doppler - unremarkable  2D Echo - unremarkable  LDL 102  HgbA1c 7.5  VTE prophylaxis - heparin subq  DIET DYS 3 Room service appropriate?: Yes; Fluid consistency:: Thin  no antithrombotic prior to admission, now on no antithrombotic secondary to left frontal ICH.  Ongoing aggressive stroke risk factor management  Therapy recommendations: Pending  Disposition: Pending  AMS - unclear etiology, could be alcohol withdraw  On CIWA protocol  On precedex  No aspiration on CXR  CT repeat no change  Currently intubated  CCM on board  Hyponatremia  On admission sodium 136-137  Na 128->130  Urine Na 195, urine osmolarity 739   On salt tablet 1g tid  CCM on board  Alcohol withdrawal  Heavy drinker as per wife  On CIWA protocol  On precedex  CCM on board  On B1, FA, multivitamin  Hypertension,  accelerated  Blood pressure high at times.  Home meds - coreg, lisinopril, benicar-HTCZ  Off cleviprix drip for BP control.  HR low d/c coreg and did not put on clonidine, Cre elevated did not resume lisinopril or HTCZ, too sensitive to hydralazine  On amlodipine  daily  BP goal < 160  Hyperlipidemia  Home meds:  resumed in hospital  LDL 102, goal < 70  On lipitor .  Continue statin at discharge  Diabetes  HgbA1c 7.5, goal < 7.0  Uncontrolled  SSI  Home meds include insulin subq  CBG monitoring  Other Stroke Risk Factors  Advanced age  Hx stroke/TIA   Other Active Problems  Low B12 - on supplement  Left eye artificial  Hospital day # 5 Plan transfer to neurology floor bed today. Mobilize out of bed. Therapy consults. Hopefully discharge home over the next couple of days.       Delia Heady, MD Stroke Neurology 12/15/2014 1:15 PM    To contact Stroke Continuity provider, please refer to [email or web address]. After hours,  contact General Neurology

## 2014-12-15 NOTE — Progress Notes (Signed)
Rehab admissions - I met briefly with patient this am.  I spoke to his wife by telephone today.  Wife would like inpatient rehab admission.  Patient has [employer].  I will open case with insurance carrier and request acute inpatient rehab admission.  I will follow up once I hear back from insurance case manager.  Call me for questions.  #[phone]

## 2014-12-15 NOTE — Plan of Care (Signed)
Problem: Progression Outcomes Goal: Progressive activity as tolerated Outcome: Progressing Ambulated 170 ft at 1530 after in chair. Tolerated well.

## 2014-12-15 NOTE — Progress Notes (Signed)
eLink Physician-Brief Progress Note Patient Name: Richard [name] DOB: 1945/05/25 MRN: [id number]   Date of Service  12/15/2014  HPI/Events of Note  Requested to change meds per tube to oral. Patient in on a diet and has no issue swallowing medications.   eICU Interventions  Medication routes changed from per tube to oral     Intervention Category Minor Interventions: Routine modifications to care plan (e.g. PRN medications for pain, fever)  Sommer,Steven Eugene 12/15/2014, 10:12 PM

## 2014-12-15 NOTE — Progress Notes (Signed)
Nutrition Follow-up  INTERVENTION:   Ensure Enlive po BID, each supplement provides 350 kcal and 20 grams of protein  NUTRITION DIAGNOSIS:   Inadequate oral intake related to dysphagia as evidenced by meal completion < 50%. Ongoing.   GOAL:   Patient will meet greater than or equal to 90% of their needs Not met.   MONITOR:   PO intake, Supplement acceptance, Labs, I & O's  ASSESSMENT:   [name] PMHx: DM, stroke without residual deficits. Pt admitted with ICH.   Pt extubated 9/18. Pt started on diet this am.  Medications reviewed and include: MVI, folic acid, L97, senokot-s CBG's: 128-140 Per SLP will need to check for pocketing. Still some aphasia/comprehension issues.   Diet Order:  DIET DYS 3 Room service appropriate?: Yes; Fluid consistency:: Thin  Skin:  Reviewed, no issues  Last BM:  9/18 via rectal pouch (inserted 9/17)  Height:   Ht Readings from Last 1 Encounters:  12/10/14 $RemoveB'6\' 2"'[id number]$  (1.88 m)    Weight:   Wt Readings from Last 1 Encounters:  12/10/14 225 lb (102.059 kg)    Ideal Body Weight:  86.36 kg  BMI:  Body mass index is 28.88 kg/(m^2).  Estimated Nutritional Needs:   Kcal:  2100-2300  Protein:  110-125 grams  Fluid:  > 2.1 L/day  EDUCATION NEEDS:   No education needs identified at this time  [place], [place], [place] Pager [phone] After Hours Pager

## 2014-12-15 NOTE — Progress Notes (Signed)
Inpatient Diabetes Program Recommendations  AACE/ADA: New Consensus Statement on Inpatient Glycemic Control (2015)  Target Ranges:  Prepandial:   less than 140 mg/dL      Peak postprandial:   less than 180 mg/dL (1-2 hours)      Critically ill patients:  140 - 180 mg/dL   Results for Richard [name], Richard [name] (MRN [id number]) as of 12/15/2014 14:31  Ref. Range 12/14/2014 00:30 12/14/2014 03:48 12/14/2014 08:13 12/14/2014 11:37 12/14/2014 15:31 12/14/2014 19:47 12/15/2014 00:17 12/15/2014 03:46 12/15/2014 07:37 12/15/2014 11:20  Glucose-Capillary Latest Ref Range: 65-99 mg/dL 409 (H) 811 (H) 914 (H) 129 (H) 114 (H) 128 (H) 134 (H) 133 (H) 140 (H) 304 (H)    Review of Glycemic Control  Diabetes history: DM2 Outpatient Diabetes medications: NPH 40 units QAM, NPH 30 units QPM, Regular 100 units TID with meals Current orders for Inpatient glycemic control: NPH 40 units QAM, NPH 30 units QHS, Novolog 0-20 units Q4H  Inpatient Diabetes Program Recommendations: Insulin - Basal: Patient was NPO on 9/18 and CBGs ranged from 114-161 mg/dl with Novolog correction insulin only. Patient received a total of Novolog 11 units on 12/14/14. Please discontinue NPH as ordered and recommend ordering Lantus 10 units Q24H (to start now; based on 102 kg x 0.1 units and total of Novolog insulin received yesterday when patient was NPO). Insulin - Meal Coverage: Please consider ordering Novolog 5 units TID with meals for meal coverage.  Talked with patient regarding diabetes and outpatient regimen. Due to his dysphagia and his stroke, unable to get a clear understanding of outpatient regiment for diabetes. From information gathered from patient he was taking NPH 40 units QAM and did not usually take any evening NPH. Patient reports that he was taking Regular insulin with meals based on a sliding scale if needed. However, patient reports he did not require Regular insulin very often. Patient reports that his PCP helps him manage his diabetes. In  reviewing glucose trends while patient was NPO, recommend discontinuing current NPH orders and order low dose Lantus 10 units Q24H and order Novolog 5 units TID with meals for meal coverage (in addition to Novolog correction scale). Discussed with Fleet Contras, RN and requested she page MD to make aware of recommendations.  Thanks, Orlando Penner, RN, MSN, CCRN, CDE Diabetes Coordinator Inpatient Diabetes Program [phone] (Team Pager from 8am to 5pm) [phone] ([facility] office) [phone] [facility] office) [phone] [facility] office)

## 2014-12-15 NOTE — Progress Notes (Addendum)
Name: Richard [name] MRN: [id number] DOB: 06-01-1945    ADMISSION DATE:  12/10/2014 CONSULTATION DATE:  12/15/2014  REFERRING MD :  Roda Shutters, neuro   CHIEF COMPLAINT:  Altered MS  BRIEF PATIENT DESCRIPTION: 69 year old diabetic, history of CVA presented 9/14 with aphasia and right-sided weakness, CT showed left frontal ICH. He remained encephalopathic, placed on Ativan per CIWA protocol for agitation, PCCM consulted 9/16 for hyponatremia and persistent encephalopathy  SIGNIFICANT EVENTS  9/16 CIR consult 9/18 Extubated  STUDIES:  9/14 head CT-3.52.5 cm left frontal hemorrhage 9/14 MRI- 3 cm left frontal hemorrhage 9/15 echo-normal LV function 9/16 head CT stable left frontal hematoma  SUBJECTIVE: Extubated yesterday. Stable resp status.  VITAL SIGNS: Temp:  [96.5 F (35.8 C)-98.6 F (37 C)] 98.5 F (36.9 C) (09/19 0739) Pulse Rate:  [54-99] 94 (09/19 0600) Resp:  [13-31] 20 (09/19 0600) BP: (111-182)/(50-101) 139/69 mmHg (09/19 0600) SpO2:  [94 %-100 %] 97 % (09/19 0600) FiO2 (%):  [30 %] 30 % (09/18 1200)  PHYSICAL EXAMINATION: Gen. well-nourished, Awake interactive. Neck: Supple, No JVD Lungs: Clear antr. No wheeze or crackles.  Cardiovascular: S1,S 2. No MRG Abdomen: Soft and non-tender BS normal. Musculoskeletal: No deformities, no cyanosis or clubbing Neuro: Right face droop. Skin:  Warm, no lesions/ rash  Recent Labs Lab 12/14/14 0227 12/14/14 1709 12/15/14 0214  NA 136 133* 135  K 4.4 3.7 4.2  CL 105 101 104  CO2 20* 19* 17*  BUN CREATININE 1.27* 1.10 1.17  GLUCOSE 153* 127* 120*    Recent Labs Lab 12/11/14 0725 12/12/14 0236 12/13/14 0224  HGB 13.4 13.0 12.4*  HCT 38.9* 38.9* 36.6*  WBC 9.0 8.5 11.0*  PLT 221 210 189   Dg Chest Port 1 View  12/14/2014   CLINICAL DATA:  Acute respiratory failure.  Cerebral infarction.  EXAM: PORTABLE CHEST - 1 VIEW  COMPARISON:  12/12/2014  FINDINGS: The cardiac silhouette, mediastinal and hilar contours  are stable. There are some streaky areas of perihilar and basilar atelectasis but no infiltrates or effusions. The bony thorax is intact.  IMPRESSION: Stable support apparatus.  Streaky areas of atelectasis but no definite infiltrates or effusions.   Electronically Signed   By: Rudie Meyer M.D.   On: 12/14/2014 08:18    ASSESSMENT / PLAN:  Acute encephalopathy- multifactorial, related to ICH, alcohol withdrawal and Ativan, and hyponatremia  EtOH withdrawal- Off Precedex. On CIWA.  Hyponatremia- resolved  ASSESSMENT / PLAN:  PULMONARY OETT 9/16 >>  A: Acute respiratory failure  P:   - Extubated. Stable on RA  CARDIOVASCULAR A: Hx hypertension P:  - on amlodipine 10 - lipitor - prn labetalol - home coreg, lisinopril, benicar (? Redundant) all on hold for now  RENAL A:  Hyponatremia, Serum osm 9/17 low-normal and euvolemic.  FENa 1.09% P:   - Na much better today. - BMP in AM.  GASTROINTESTINAL A:  Nutrition  SUP P:   - Speech eval. Keep NPO till then. - PPI  HEMATOLOGIC A:  DVT prophylaxis P:  - Heparin sq  INFECTIOUS A:  No evidence infection  P:   - Following off abx  ENDOCRINE A:  Hyperglycemia / DM   P:   - insulin SS protocol  NEUROLOGIC A:  Acute encephalopathy, multifactorial due to ICH, EtOH withdrawal, hyponatremia, meds / ativan  P:   Off predecex. Much more alert today Continue CIWA protocol.   FAMILY  - Updates: no family present 9/17  - Inter-disciplinary  family meet or Palliative Care meeting due by: 12/17/14  Independent CC time 35 minutes.   Chilton Greathouse MD  Pulmonary and Critical Care Pager [phone] If no answer or after 3pm call: [phone] 12/15/2014, 8:10 AM

## 2014-12-15 NOTE — Progress Notes (Signed)
Patient transferred from 74M to [phone]. He is alert, hard to understand at this time d/t condition. Ambulate to chair for dinner.Denied pain at this time. Safety precautions and orders reviewed. Report to oncoming RN.    Sim Boast, RN

## 2014-12-15 NOTE — Progress Notes (Signed)
Physical Therapy Treatment Patient Details Name: Richard [name] MRN: [id number] DOB: 1945-12-02 Today's Date: 12/15/2014    History of Present Illness Richard [name] is an 69 y.o. male, right handed, with a past medical history significant for DM, stroke without residual deficits, s/p artificial left eye, brought in for evaluation of aphasia, R fackal weakness and R sided weakness.  CT/MRI shows L frontal ICH  Patient with decreased safety and poor awareness of right side both with body and spatial and very high risk of falls.  Patient will benefit from CIR level therapies for interdisciplinary rehab including cognitive, speech, occupational and physical therapies.    PT Comments      Follow Up Recommendations  Supervision for mobility/OOB;CIR     Equipment Recommendations  Other (comment) (TBA, will try cane next session)    Recommendations for Other Services       Precautions / Restrictions Precautions Precautions: Fall    Mobility  Bed Mobility           Sit to supine: Min assist   General bed mobility comments: cuess for direction  Transfers Overall transfer level: Needs assistance Equipment used: Rolling walker (2 wheeled) Transfers: Sit to/from Stand Sit to Stand: Min assist;Mod assist         General transfer comment: + 2 for safety, but pt not needing much help to rise  Ambulation/Gait Ambulation/Gait assistance: Mod assist;Min assist;+2 safety/equipment Ambulation Distance (Feet): 300 Feet Assistive device: Rolling walker (2 wheeled);None Gait Pattern/deviations: Step-through pattern;Decreased dorsiflexion - right;Wide base of support     General Gait Details: impaired safety with walker and right UE awarness limited; cues and manual assist to place on walker, one episode of right hand falling off walker and near loss of balance mod assist to recover,  assist on turns, around obstacels for safety with walker,  in room no device, min/mod assist for safety ,  cues for upright posture.   Stairs            Wheelchair Mobility    Modified Rankin (Stroke Patients Only) Modified Rankin (Stroke Patients Only) Pre-Morbid Rankin Score: No symptoms Modified Rankin: Moderately severe disability     Balance Overall balance assessment: Needs assistance   Sitting balance-Leahy Scale: Fair       Standing balance-Leahy Scale: Poor                      Cognition Arousal/Alertness: Awake/alert Behavior During Therapy: Impulsive Overall Cognitive Status: Impaired/Different from baseline Area of Impairment: Safety/judgement;Awareness;Problem solving         Safety/Judgement: Decreased awareness of deficits;Decreased awareness of safety Awareness: Intellectual Problem Solving: Difficulty sequencing;Requires verbal cues      Exercises      General Comments        Pertinent Vitals/Pain Pain Assessment: No/denies pain Faces Pain Scale: No hurt    Home Living                      Prior Function            PT Goals (current goals can now be found in the care plan section) Progress towards PT goals: Progressing toward goals    Frequency  Min 4X/week    PT Plan Current plan remains appropriate;Frequency needs to be updated    Co-evaluation             End of Session Equipment Utilized During Treatment: Gait belt Activity Tolerance: Patient tolerated treatment well Patient left:  in chair;with call bell/phone within reach     Time: 1312-1341 PT Time Calculation (min) (ACUTE ONLY): 29 min  Charges:  $Gait Training: 8-22 mins $Therapeutic Activity: 8-22 mins                    G Codes:      WYNN,CYNDI 01-11-2015, 3:12 PM  [place], [place] [phone]/01/11

## 2014-12-15 NOTE — Progress Notes (Signed)
eLink Physician-Brief Progress Note Patient Name: Richard [name] DOB: 11-20-1945 MRN: [id number]   Date of Service  12/15/2014  HPI/Events of Note  Diabetes Nurse recommends D/C NPH Insulin BID and starting Lantus 10 units now and Q 24 hours. Last blood glucose = 304.   eICU Interventions  Will D/C NPH insulin BID and start Lantus 10 units now and Q day. Continue Q 4 hour Novolog coverage for now.      Intervention Category Intermediate Interventions: Hyperglycemia - evaluation and treatment  Sommer,Steven Dennard Nip 12/15/2014, 3:55 PM

## 2014-12-15 NOTE — Evaluation (Addendum)
Clinical/Bedside Swallow Evaluation Patient Details  Name: Richard [name] MRN: [id number] Date of Birth: 15-Dec-1945  Today's Date: 12/15/2014 Time: SLP Start Time (ACUTE ONLY): 0857 SLP Stop Time (ACUTE ONLY): 0912 SLP Time Calculation (min) (ACUTE ONLY): 17 min  Past Medical History:  Past Medical History  Diagnosis Date  . Diabetes mellitus without complication   . Stroke    Past Surgical History: History reviewed. No pertinent past surgical history. HPI:  69 year old male with history of diabetes, CVA presented 9/14 with aphasia and right-sided weakness, CT showed left frontal ICH. Also with multifactorial encephalopathy related to ICH, alchohol withdrawl, hyponatremia. Intubated 9/16-9/18.    Assessment / Plan / Recommendation Clinical Impression  Patient presents with a mild oral dysphagia characterized by decreased CN VII function resulting in right sided facial weakness and leading to mildly delayed but functional A-P oral transit of solids. Patient appears to be protecting airway at this time with no overt s/s of aspiration with full oral clearance. Mildly impulsive with intake which may increase aspiration risk. Intermittent supervision recommended with po intake. Given aspiration risk, will f/u for tolerance and potential to advance diet with improved facial strength and availability of dentures.     Aspiration Risk  Mild    Diet Recommendation Dysphagia 3 (Mech soft);Thin   Medication Administration: Whole meds with liquid Compensations: Slow rate;Small sips/bites;Check for pocketing    Other  Recommendations Oral Care Recommendations: Oral care BID      Frequency and Duration min 2x/week  2 weeks   Pertinent Vitals/Pain n/a        Swallow Study    General Other Pertinent Information: 69 year old male with history of diabetes, CVA presented 9/14 with aphasia and right-sided weakness, CT showed left frontal ICH. Also with multifactorial encephalopathy related to  ICH, alchohol withdrawl, hyponatremia. Intubated 9/16-9/18.  Type of Study: Bedside swallow evaluation Previous Swallow Assessment: none noted Diet Prior to this Study: NPO Temperature Spikes Noted: No Respiratory Status: Room air History of Recent Intubation: Yes Length of Intubations (days): 2 days Date extubated: 12/14/14 Behavior/Cognition: Alert;Cooperative;Pleasant mood (global aphasia) Oral Cavity - Dentition: Poor condition;Missing dentition (per patient has dentures but not available) Self-Feeding Abilities: Able to feed self Patient Positioning: Upright in bed Baseline Vocal Quality: Normal Volitional Cough: Strong Volitional Swallow: Able to elicit    Oral/Motor/Sensory Function Overall Oral Motor/Sensory Function: Impaired (decreased CN VII function on right. left eye ptosis baseline) Labial ROM: Reduced right Labial Symmetry: Abnormal symmetry right Labial Strength: Reduced Labial Sensation: Reduced Facial Symmetry: Right droop Facial Strength: Reduced Facial Sensation: Reduced   Ice Chips Ice chips: Within functional limits Presentation: Spoon   Thin Liquid Thin Liquid: Within functional limits Presentation: Cup;Straw    Nectar Thick Nectar Thick Liquid: Not tested   Honey Thick Honey Thick Liquid: Not tested   Puree Puree: Within functional limits Presentation: Spoon   Solid   GO   Richard McCoy MA, CCC-SLP [phone]  Solid: Within functional limits Presentation: Self Fed       [name] Richard Meryl 12/15/2014,11:58 AM

## 2014-12-16 ENCOUNTER — Inpatient Hospital Stay (HOSPITAL_COMMUNITY)
Admission: AD | Admit: 2014-12-16 | Discharge: 2014-12-26 | DRG: 057 | Disposition: A | Discharge status: Home Health | Payer: Medicare HMO | Source: Intra-hospital | Attending: Physical Medicine & Rehabilitation | Admitting: Physical Medicine & Rehabilitation

## 2014-12-16 DIAGNOSIS — E876 Hypokalemia: Secondary | ICD-10-CM | POA: Diagnosis present

## 2014-12-16 DIAGNOSIS — I69251 Hemiplegia and hemiparesis following other nontraumatic intracranial hemorrhage affecting right dominant side: Secondary | ICD-10-CM | POA: Diagnosis present

## 2014-12-16 DIAGNOSIS — I69398 Other sequelae of cerebral infarction: Secondary | ICD-10-CM

## 2014-12-16 DIAGNOSIS — K5900 Constipation, unspecified: Secondary | ICD-10-CM | POA: Diagnosis present

## 2014-12-16 DIAGNOSIS — I69220 Aphasia following other nontraumatic intracranial hemorrhage: Secondary | ICD-10-CM | POA: Diagnosis not present

## 2014-12-16 DIAGNOSIS — R29810 Facial weakness: Secondary | ICD-10-CM | POA: Diagnosis present

## 2014-12-16 DIAGNOSIS — I610 Nontraumatic intracerebral hemorrhage in hemisphere, subcortical: Secondary | ICD-10-CM

## 2014-12-16 DIAGNOSIS — I69393 Ataxia following cerebral infarction: Secondary | ICD-10-CM

## 2014-12-16 DIAGNOSIS — E785 Hyperlipidemia, unspecified: Secondary | ICD-10-CM | POA: Diagnosis present

## 2014-12-16 DIAGNOSIS — E1140 Type 2 diabetes mellitus with diabetic neuropathy, unspecified: Secondary | ICD-10-CM

## 2014-12-16 DIAGNOSIS — I69898 Other sequelae of other cerebrovascular disease: Secondary | ICD-10-CM | POA: Diagnosis not present

## 2014-12-16 DIAGNOSIS — Z970 Presence of artificial eye: Secondary | ICD-10-CM

## 2014-12-16 DIAGNOSIS — Z794 Long term (current) use of insulin: Secondary | ICD-10-CM | POA: Diagnosis not present

## 2014-12-16 DIAGNOSIS — I619 Nontraumatic intracerebral hemorrhage, unspecified: Secondary | ICD-10-CM | POA: Diagnosis present

## 2014-12-16 DIAGNOSIS — I10 Essential (primary) hypertension: Secondary | ICD-10-CM | POA: Diagnosis present

## 2014-12-16 DIAGNOSIS — Z79899 Other long term (current) drug therapy: Secondary | ICD-10-CM

## 2014-12-16 DIAGNOSIS — R209 Unspecified disturbances of skin sensation: Secondary | ICD-10-CM

## 2014-12-16 DIAGNOSIS — I69293 Ataxia following other nontraumatic intracranial hemorrhage: Secondary | ICD-10-CM

## 2014-12-16 DIAGNOSIS — E1142 Type 2 diabetes mellitus with diabetic polyneuropathy: Secondary | ICD-10-CM | POA: Diagnosis present

## 2014-12-16 DIAGNOSIS — I618 Other nontraumatic intracerebral hemorrhage: Secondary | ICD-10-CM

## 2014-12-16 DIAGNOSIS — E119 Type 2 diabetes mellitus without complications: Secondary | ICD-10-CM

## 2014-12-16 LAB — GLUCOSE, CAPILLARY
GLUCOSE-CAPILLARY: 120 mg/dL — AB (ref 65–99)
GLUCOSE-CAPILLARY: 159 mg/dL — AB (ref 65–99)
GLUCOSE-CAPILLARY: 180 mg/dL — AB (ref 65–99)
GLUCOSE-CAPILLARY: 209 mg/dL — AB (ref 65–99)
GLUCOSE-CAPILLARY: 255 mg/dL — AB (ref 65–99)
Glucose-Capillary: 352 mg/dL — ABNORMAL HIGH (ref 65–99)
Glucose-Capillary: 294 mg/dL — ABNORMAL HIGH (ref 65–99)
Glucose-Capillary: 186 mg/dL — ABNORMAL HIGH (ref 65–99)

## 2014-12-16 LAB — CBC
HEMATOCRIT: 37 % — AB (ref 39.0–52.0)
HEMOGLOBIN: 13 g/dL (ref 13.0–17.0)
MCH: 30.8 pg (ref 26.0–34.0)
MCHC: 35.1 g/dL (ref 30.0–36.0)
MCV: 87.7 fL (ref 78.0–100.0)
Platelets: 248 10*3/uL (ref 150–400)
RBC: 4.22 MIL/uL (ref 4.22–5.81)
RDW: 11.4 % — AB (ref 11.5–15.5)
WBC: 10.5 10*3/uL (ref 4.0–10.5)

## 2014-12-16 LAB — CREATININE, SERUM: CREATININE: 1.07 mg/dL (ref 0.61–1.24)

## 2014-12-16 MED ORDER — ENSURE ENLIVE PO LIQD: 237.0000 mL | Freq: Two times a day (BID) | ORAL | Status: DC

## 2014-12-16 MED ORDER — INSULIN ASPART 100 UNIT/ML ~~LOC~~ SOLN
0.0000 [IU] | Freq: Three times a day (TID) | SUBCUTANEOUS | Status: DC
  Administered 2014-12-16 (×2): 5 [IU] via SUBCUTANEOUS

## 2014-12-16 MED ORDER — ACETAMINOPHEN 650 MG RE SUPP: 650.0000 mg | RECTAL | Status: DC | PRN

## 2014-12-16 MED ORDER — ONDANSETRON HCL 4 MG PO TABS: 4.0000 mg | ORAL_TABLET | Freq: Four times a day (QID) | ORAL | Status: DC | PRN

## 2014-12-16 MED ORDER — ADULT MULTIVITAMIN W/MINERALS CH
1.0000 | ORAL_TABLET | Freq: Every day | ORAL | Status: DC
  Administered 2014-12-17 – 2014-12-26 (×10): 1 via ORAL
  Filled 2014-12-16 (×10): qty 1

## 2014-12-16 MED ORDER — INSULIN ASPART 100 UNIT/ML ~~LOC~~ SOLN
5.0000 [IU] | Freq: Three times a day (TID) | SUBCUTANEOUS | Status: DC
  Administered 2014-12-16 (×2): 5 [IU] via SUBCUTANEOUS

## 2014-12-16 MED ORDER — INSULIN GLARGINE 100 UNIT/ML ~~LOC~~ SOLN
10.0000 [IU] | Freq: Every day | SUBCUTANEOUS | Status: DC
Start: 2014-12-16 — End: 2014-12-16
  Filled 2014-12-16: qty 0.1

## 2014-12-16 MED ORDER — ATORVASTATIN CALCIUM 10 MG PO TABS
10.0000 mg | ORAL_TABLET | Freq: Every day | ORAL | Status: DC
  Administered 2014-12-17 – 2014-12-25 (×9): 10 mg via ORAL
  Filled 2014-12-16 (×9): qty 1

## 2014-12-16 MED ORDER — VITAMIN B-12 100 MCG PO TABS
100.0000 ug | ORAL_TABLET | Freq: Every day | ORAL | Status: DC
  Administered 2014-12-17 – 2014-12-25 (×9): 100 ug via ORAL
  Filled 2014-12-16 (×13): qty 1

## 2014-12-16 MED ORDER — INSULIN GLARGINE 100 UNIT/ML ~~LOC~~ SOLN
20.0000 [IU] | Freq: Every day | SUBCUTANEOUS | Status: DC
Start: 2014-12-16 — End: 2014-12-16

## 2014-12-16 MED ORDER — SORBITOL 70 % SOLN
30.0000 mL | Freq: Every day | Status: DC | PRN
  Administered 2014-12-17: 30 mL via ORAL
  Filled 2014-12-16: qty 30

## 2014-12-16 MED ORDER — INSULIN ASPART 100 UNIT/ML ~~LOC~~ SOLN: 0.0000 [IU] | Freq: Every day | SUBCUTANEOUS | Status: DC

## 2014-12-16 MED ORDER — HEPARIN SODIUM (PORCINE) 5000 UNIT/ML IJ SOLN
5000.0000 [IU] | Freq: Three times a day (TID) | INTRAMUSCULAR | Status: DC
  Administered 2014-12-16 – 2014-12-22 (×17): 5000 [IU] via SUBCUTANEOUS
  Filled 2014-12-16 (×17): qty 1

## 2014-12-16 MED ORDER — HEPARIN SODIUM (PORCINE) 5000 UNIT/ML IJ SOLN: 5000.0000 [IU] | Freq: Three times a day (TID) | INTRAMUSCULAR | Status: DC

## 2014-12-16 MED ORDER — ACETAMINOPHEN 325 MG PO TABS
650.0000 mg | ORAL_TABLET | ORAL | Status: DC | PRN
  Administered 2014-12-21 – 2014-12-24 (×4): 650 mg via ORAL
  Filled 2014-12-16 (×4): qty 2

## 2014-12-16 MED ORDER — SENNOSIDES-DOCUSATE SODIUM 8.6-50 MG PO TABS
1.0000 | ORAL_TABLET | Freq: Two times a day (BID) | ORAL | Status: DC
  Administered 2014-12-16 – 2014-12-17 (×2): 1 via ORAL
  Filled 2014-12-16 (×2): qty 1

## 2014-12-16 MED ORDER — INSULIN ASPART 100 UNIT/ML ~~LOC~~ SOLN
5.0000 [IU] | Freq: Three times a day (TID) | SUBCUTANEOUS | Status: DC
  Administered 2014-12-17 – 2014-12-26 (×28): 5 [IU] via SUBCUTANEOUS

## 2014-12-16 MED ORDER — ONDANSETRON HCL 4 MG/2ML IJ SOLN: 4.0000 mg | Freq: Four times a day (QID) | INTRAMUSCULAR | Status: DC | PRN

## 2014-12-16 MED ORDER — VITAMIN B-1 100 MG PO TABS
100.0000 mg | ORAL_TABLET | Freq: Every day | ORAL | Status: DC
  Administered 2014-12-17 – 2014-12-26 (×10): 100 mg via ORAL
  Filled 2014-12-16 (×10): qty 1

## 2014-12-16 MED ORDER — INSULIN GLARGINE 100 UNIT/ML ~~LOC~~ SOLN
10.0000 [IU] | Freq: Every day | SUBCUTANEOUS | Status: DC
  Administered 2014-12-16 – 2014-12-19 (×4): 10 [IU] via SUBCUTANEOUS
  Filled 2014-12-16 (×5): qty 0.1

## 2014-12-16 MED ORDER — HYDROCHLOROTHIAZIDE 25 MG PO TABS
25.0000 mg | ORAL_TABLET | Freq: Every day | ORAL | Status: DC
  Administered 2014-12-17 – 2014-12-26 (×10): 25 mg via ORAL
  Filled 2014-12-16 (×10): qty 1

## 2014-12-16 MED ORDER — FOLIC ACID 1 MG PO TABS
1.0000 mg | ORAL_TABLET | Freq: Every day | ORAL | Status: DC
  Administered 2014-12-17 – 2014-12-26 (×10): 1 mg via ORAL
  Filled 2014-12-16 (×10): qty 1

## 2014-12-16 MED ORDER — IRBESARTAN 300 MG PO TABS
300.0000 mg | ORAL_TABLET | Freq: Every day | ORAL | Status: DC
  Administered 2014-12-17 – 2014-12-26 (×10): 300 mg via ORAL
  Filled 2014-12-16 (×10): qty 1

## 2014-12-16 MED ORDER — AMLODIPINE BESYLATE 10 MG PO TABS
10.0000 mg | ORAL_TABLET | Freq: Every day | ORAL | Status: DC
  Administered 2014-12-17 – 2014-12-26 (×10): 10 mg via ORAL
  Filled 2014-12-16 (×10): qty 1

## 2014-12-16 NOTE — Progress Notes (Signed)
Patient admitted with foley cath in place, notified Richard [name] with order to keep foley in & reevaluate tomorrow. CBG changed to achs per order.

## 2014-12-16 NOTE — Progress Notes (Signed)
STROKE TEAM PROGRESS NOTE   SUBJECTIVE (INTERVAL HISTORY) No family is at the bedside. Pt ready for transfer to rehab and is doing well.     OBJECTIVE Temp:  [97.8 F (36.6 C)-99.2 F (37.3 C)] 97.8 F (36.6 C) (09/20 1339) Pulse Rate:  [88-106] 97 (09/20 1339) Cardiac Rhythm:  [-] Normal sinus rhythm (09/19 2037) Resp:  [17-20] 20 (09/20 1339) BP: (120-163)/(70-86) 131/76 mmHg (09/20 1339) SpO2:  [95 %-100 %] 100 % (09/20 1339)  CBC:  Recent Labs Lab 12/10/14 0920  12/12/14 0236 12/13/14 0224  WBC 8.6  < > 8.5 11.0*  NEUTROABS 5.2  --   --   --   HGB 14.2  < > 13.0 12.4*  HCT 41.2  < > 38.9* 36.6*  MCV 89.6  < > 89.8 88.8  PLT 238  < > 210 189  < > = values in this interval not displayed.  Basic Metabolic Panel:   Recent Labs Lab 12/14/14 1709 12/15/14 0214  NA 133* 135  K 3.7 4.2  CL 101 104  CO2 19* 17*  GLUCOSE 127* 120*  BUN 15 12  CREATININE 1.10 1.17  CALCIUM 8.4* 8.8*    Lipid Panel:     Component Value Date/Time   CHOL 187 12/11/2014 0725   TRIG 218* 12/11/2014 0725   HDL 41 12/11/2014 0725   CHOLHDL 4.6 12/11/2014 0725   VLDL 44* 12/11/2014 0725   LDLCALC 102* 12/11/2014 0725   HgbA1c:  Lab Results  Component Value Date   HGBA1C 7.5* 12/11/2014   Urine Drug Screen:     Component Value Date/Time   LABOPIA NONE DETECTED 12/11/2014 1307   COCAINSCRNUR NONE DETECTED 12/11/2014 1307   LABBENZ NONE DETECTED 12/11/2014 1307   AMPHETMU NONE DETECTED 12/11/2014 1307   THCU NONE DETECTED 12/11/2014 1307   LABBARB NONE DETECTED 12/11/2014 1307      IMAGING I have personally reviewed the radiological images below and agree with the radiology interpretations.  Ct Head Wo Contrast 12/10/2014    1. Acute left frontal hemorrhage with surrounding edema, likely representing atypical hemorrhagic stroke. A hemorrhagic mass is considered less likely. CT or MR follow up suggested.  2. Confluent low-density in the periventricular and subcortical white  matter consistent with chronic small vessel ischemic changes.   12/11/2014   IMPRESSION: Unchanged left frontal parenchymal hematoma, approximately 12 cc volume.    12/12/2014    IMPRESSION: Stable LEFT posterior frontal intracerebral hematoma, without significant extension or worsening midline shift. Chronic changes also stable.     Dg Chest Port 1 View  12/11/2014   IMPRESSION: Low lung volumes with no radiographic evidence of acute cardiopulmonary disease  Mr [name] Wo Contrast 12/10/2014    Acute LEFT posterior frontal intracerebral hemorrhage 30 x 24 x 32 mm, with estimated volume 12 mL.  Mild surrounding edema. This is favored to represent a lobar hypertensive bleed.  Cerebral amyloid angiopathy is not excluded. Doubt embolic infarction or hemorrhagic tumor.  No sign suggestive of vascular malformation or cortical venous thrombosis.   Moderate atrophy with extensive white matter signal abnormality, likely chronic microvascular ischemic change.     2D echo - - Left ventricle: The cavity size was normal. Wall thickness was increased in a pattern of mild LVH. Systolic function was normal. The estimated ejection fraction was in the range of 60% to 65%. Wall motion was normal; there were no regional wall motion abnormalities. Doppler parameters are consistent with abnormal left ventricular relaxation (grade 1  diastolic dysfunction). The E/e&' ratio is <8, suggesting normal LV filling pressure. - Left atrium: The atrium was normal in size. - Right atrium: The atrium was mildly dilated. Impressions: - LVEF 60-65%, mild LVH, normal wall motion, diastolic dysfunction,normal LV filling pressure, mild RAE.  CUS - Findings suggest 1-39% internal carotid artery stenosis of visualized segments bilaterally. Vertebral arteries are patent with antegrade flow.  EEG 12/13/14 abnormal with moderately severe generalized continuous nonspecific slowing of cerebral activity. This pattern of  slowing can be seen with metabolic and toxic encephalopathies as well as degenerative central nervous system disorders. No evidence of seizure activity was recorded.   PHYSICAL EXAM  Temp:  [97.8 F (36.6 C)-99.2 F (37.3 C)] 97.8 F (36.6 C) (09/20 1339) Pulse Rate:  [88-106] 97 (09/20 1339) Resp:  [17-20] 20 (09/20 1339) BP: (120-163)/(70-86) 131/76 mmHg (09/20 1339) SpO2:  [95 %-100 %] 100 % (09/20 1339)  General - Well nourished, well developed, elderly african american malefollowing commands.  Ophthalmologic - Fundi not visualized due to noncooperation.left eye is artificial  Cardiovascular - Regular rate and rhythm with no murmur.  Neuro   Awake   following commands consistently. Pupil reactive to light on right and left eye is prosthetic, right facial droop,moves all 4 limbs well against gravity and no drift. DTR 1+, no babinski.  ASSESSMENT/PLAN [name] is a 69 y.o. male with history of DM, previous stroke without residual deficits, s/p artificial left eye, presenting with aphasia and right facial weakness. He did not receive IV t-PA due to ICH.  Stroke: lobar left frontal ICH, likely hypertensive, although CAA not excluded.  Resultant  Aphasia, right hemiparesis.  MRI - Acute LEFT posterior frontal ICH, 12-15 mL.   MRA - no evidence of AVM or CAA.  Repeat CT showed stable hematoma  Carotid Doppler - unremarkable  2D Echo - unremarkable  LDL 102  HgbA1c 7.5  VTE prophylaxis - heparin subq  DIET DYS 3 Room service appropriate?: Yes; Fluid consistency:: Thin  no antithrombotic prior to admission, now on no antithrombotic secondary to left frontal ICH.  Ongoing aggressive stroke risk factor management  Therapy recommendations: Pending  Disposition: Pending  AMS - unclear etiology, could be alcohol withdraw  On CIWA protocol  On precedex  No aspiration on CXR  CT repeat no change  Currently intubated  CCM on board  Hyponatremia  On  admission sodium 136-137  Na 128->130  Urine Na 195, urine osmolarity 739   On salt tablet 1g tid  CCM on board  Alcohol withdrawal  Heavy drinker as per wife  On CIWA protocol  On precedex  CCM on board  On B1, FA, multivitamin  Hypertension, accelerated  Blood pressure high at times.  Home meds - coreg, lisinopril, benicar-HTCZ  Off cleviprix drip for BP control.  HR low d/c coreg and did not put on clonidine, Cre elevated did not resume lisinopril or HTCZ, too sensitive to hydralazine  On amlodipine  daily  BP goal < 160  Hyperlipidemia  Home meds:  resumed in hospital  LDL 102, goal < 70  On lipitor .  Continue statin at discharge  Diabetes  HgbA1c 7.5, goal < 7.0  Uncontrolled  SSI  Home meds include insulin subq  CBG monitoring  Other Stroke Risk Factors  Advanced age  Hx stroke/TIA   Other Active Problems  Low B12 - on supplement  Left eye artificial  Hospital day # 6 Plan transfer to rehab bed today. Mobilize out of  bed.     Delia Heady, MD Stroke Neurology 12/16/2014 2:40 PM    To contact Stroke Continuity provider, please refer to [email or web address]. After hours, contact General Neurology

## 2014-12-16 NOTE — Discharge Summary (Signed)
Stroke Discharge Summary  Patient ID: Richard [name]   MRN: [id number]      DOB: Aug 13, 1945  Date of Admission: 12/10/2014 Date of Discharge: 12/16/2014  Attending Physician:  Richard Riley, MD, Stroke MD  Consulting Physician(s):    pulmonary/intensive care and Rehabilitation M.D. 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  Past Medical History  Diagnosis Date  . Diabetes mellitus without complication   . Stroke    History reviewed. No pertinent past surgical history.  Medications to be continued on Rehab .  stroke: mapping our early stages of recovery book   Does not apply Once  . amLODipine  10 mg Oral Daily  . atorvastatin  10 mg Oral q1800  . feeding supplement (ENSURE ENLIVE)  237 mL Oral BID BM  . folic acid  1 mg Oral Daily  . heparin subcutaneous  5,000 Units Subcutaneous 3 times per day  . irbesartan  300 mg Oral Daily   And  . hydrochlorothiazide  25 mg Oral Daily  . insulin aspart  0-5 Units Subcutaneous QHS  . insulin aspart  0-9 Units Subcutaneous TID WC  . insulin aspart  5 Units Subcutaneous TID WC  . insulin glargine  10 Units Subcutaneous QHS  . multivitamin with minerals  1 tablet Oral Daily  . senna-docusate  1 tablet Oral BID  . thiamine  100 mg Oral Daily  . vitamin B-12  100 mcg Oral Daily    LABORATORY STUDIES CBC    Component Value Date/Time   WBC 11.0* 12/13/2014 0224   RBC 4.12* 12/13/2014 0224   HGB 12.4* 12/13/2014 0224   HCT 36.6* 12/13/2014 0224   PLT 189 12/13/2014 0224   MCV 88.8 12/13/2014 0224   MCH 30.1 12/13/2014 0224   MCHC 33.9 12/13/2014 0224   RDW 11.6 12/13/2014 0224   LYMPHSABS 2.5 12/10/2014 0920   MONOABS 0.6 12/10/2014 0920   EOSABS 0.3 12/10/2014 0920    BASOSABS 0.0 12/10/2014 0920   CMP    Component Value Date/Time   NA 135 12/15/2014 0214   K 4.2 12/15/2014 0214   CL 104 12/15/2014 0214   CO2 17* 12/15/2014 0214   GLUCOSE 120* 12/15/2014 0214   BUN 12 12/15/2014 0214   CREATININE 1.17 12/15/2014 0214   CALCIUM 8.8* 12/15/2014 0214   PROT 7.3 12/10/2014 0920   ALBUMIN 4.1 12/10/2014 0920   AST 25 12/10/2014 0920   ALT 15* 12/10/2014 0920   ALKPHOS 69 12/10/2014 0920   BILITOT 0.8 12/10/2014 0920   GFRNONAA >60 12/15/2014 0214   GFRAA >60 12/15/2014 0214   COAGS Lab Results  Component Value Date   INR 1.02 12/10/2014   Lipid Panel    Component Value Date/Time   CHOL 187 12/11/2014 0725   TRIG 218* 12/11/2014 0725   HDL 41 12/11/2014 0725   CHOLHDL 4.6 12/11/2014 0725   VLDL 44* 12/11/2014 0725   LDLCALC 102* 12/11/2014 0725   HgbA1C  Lab Results  Component Value Date   HGBA1C 7.5* 12/11/2014   Cardiac Panel (last 3 results) No results for input(s): CKTOTAL, CKMB, TROPONINI, RELINDX in the last 72 hours. Urinalysis    Component Value Date/Time   COLORURINE YELLOW 12/11/2014 1307   APPEARANCEUR CLEAR 12/11/2014 1307  LABSPEC 1.026 12/11/2014 1307   PHURINE 7.0 12/11/2014 1307   GLUCOSEU >1000* 12/11/2014 1307   HGBUR NEGATIVE 12/11/2014 1307   BILIRUBINUR NEGATIVE 12/11/2014 1307   KETONESUR 40* 12/11/2014 1307   PROTEINUR NEGATIVE 12/11/2014 1307   UROBILINOGEN 1.0 12/11/2014 1307   NITRITE NEGATIVE 12/11/2014 1307   LEUKOCYTESUR NEGATIVE 12/11/2014 1307   Urine Drug Screen     Component Value Date/Time   LABOPIA NONE DETECTED 12/11/2014 1307   COCAINSCRNUR NONE DETECTED 12/11/2014 1307   LABBENZ NONE DETECTED 12/11/2014 1307   AMPHETMU NONE DETECTED 12/11/2014 1307   THCU NONE DETECTED 12/11/2014 1307   LABBARB NONE DETECTED 12/11/2014 1307    Alcohol Level No results found for: ETH   SIGNIFICANT DIAGNOSTIC STUDIES   Ct Head Wo Contrast 12/10/2014  1. Acute left frontal hemorrhage with  surrounding edema, likely representing atypical hemorrhagic stroke. A hemorrhagic mass is considered less likely. CT or MR follow up suggested.  2. Confluent low-density in the periventricular and subcortical white matter consistent with chronic small vessel ischemic changes.   12/11/2014 IMPRESSION: Unchanged left frontal parenchymal hematoma, approximately 12 cc volume.   12/12/2014 IMPRESSION: Stable LEFT posterior frontal intracerebral hematoma, without significant extension or worsening midline shift. Chronic changes also stable.    Dg Chest Port 1 View  12/11/2014 IMPRESSION: Low lung volumes with no radiographic evidence of acute cardiopulmonary disease    Mr Richard [name] Wo Contrast 12/10/2014  Acute LEFT posterior frontal intracerebral hemorrhage 30 x 24 x 32 mm, with estimated volume 12 mL.  Mild surrounding edema. This is favored to represent a lobar hypertensive bleed.  Cerebral amyloid angiopathy is not excluded. Doubt embolic infarction or hemorrhagic tumor.  No sign suggestive of vascular malformation or cortical venous thrombosis.  Moderate atrophy with extensive white matter signal abnormality, likely chronic microvascular ischemic change.     2D echo - - Left ventricle: The cavity size was normal. Wall thickness was increased in a pattern of mild LVH. Systolic function was normal. The estimated ejection fraction was in the range of 60% to 65%. Wall motion was normal; there were no regional wall motion abnormalities. Doppler parameters are consistent with abnormal left ventricular relaxation (grade 1 diastolic dysfunction). The E/e&' ratio is <8, suggesting normal LV filling pressure. - Left atrium: The atrium was normal in size. - Right atrium: The atrium was mildly dilated. Impressions: - LVEF 60-65%, mild LVH, normal wall motion, diastolic dysfunction,normal LV filling pressure, mild RAE.   CUS - Findings suggest 1-39% internal carotid  artery stenosis of visualized segments bilaterally. Vertebral arteries are patent with antegrade flow.   EEG 12/13/14 abnormal with moderately severe generalized continuous nonspecific slowing of cerebral activity. This pattern of slowing can be seen with metabolic and toxic encephalopathies as well as degenerative central nervous system disorders. No evidence of seizure activity was recorded.     HISTORY OF PRESENT ILLNES Richard [name] is a 69 y.o. male, right handed, with a past medical history significant for DM, stroke without residual deficits, s/p artificial left eye, brought in for evaluation of the aforementioned symptoms/signs.  Patient son is at the bedside and indicated that his father was last seen normal around 11 pm last night, but when he woke up this morning he was not able to speak fluently and he noticed weakness of the right side of his face, and thus he became concerned and brought patient to the ED. Upon arrival to the ED, patient was described as awake and alert but aphasic with  right face weakness and SBP>200. Urgent CT brain was obtained, personally reviewed, and showed evidence of a lobar left frontal ICH measuring 3.5 x 2.5 cm transverse, with an estimated ICH volume of approximately of 17.5 ml. There is mild surrounding vasogenic edema but no IV extension or hydrocephalus.  Currently, patient is able to follow commands but has expressive aphasia. Complains of mild HA but denies vertigo, double vision, focal numbness, difficulty swallowing. No recent head trauma, no use of anticoagulants or antiplatelets. Son reports no concerns regarding his father cognitive skills. Started on nicardipine drip as his BP remains >190. Serologies reviewed: PTT 27, INR 1.02, platelets 238.  HOSPITAL COURSE   ASSESSMENT/PLAN Richard [name] is a 69 y.o. male with history of DM, previous stroke without residual deficits, s/p artificial left eye, presenting with aphasia and right facial  weakness. He did not receive IV t-PA due to ICH.  Stroke: lobar left frontal ICH, likely hypertensive, although CAA not excluded.  Resultant Aphasia, right hemiparesis.  MRI - Acute LEFT posterior frontal ICH, 12-15 mL.   MRA - no evidence of AVM or CAA.  Repeat CT showed stable hematoma  Carotid Doppler - unremarkable  2D Echo - unremarkable  LDL 102  HgbA1c 7.5  VTE prophylaxis - heparin subq  DIET DYS 3 Room service appropriate?: Yes; Fluid consistency:: Thin  no antithrombotic prior to admission, now on no antithrombotic secondary to left frontal ICH.  Ongoing aggressive stroke risk factor management  Therapy recommendations: Inpatient rehabilitation  Disposition: Pending  AMS - unclear etiology, could be alcohol withdraw  On CIWA protocol  On precedex  No aspiration on CXR  CT repeat no change  Currently intubated  CCM on board  Hyponatremia  On admission sodium 136-137  Na 128->130  Urine Na 195, urine osmolarity 739  On salt tablet 1g tid  CCM on board  Alcohol withdrawal  Heavy drinker as per wife  On CIWA protocol  On precedex  CCM on board  On B1, FA, multivitamin  Hypertension, accelerated  Blood pressure high at times.  Home meds - coreg, lisinopril, benicar-HTCZ  Off cleviprix drip for BP control.  HR low d/c coreg and did not put on clonidine, Cre elevated did not resume lisinopril or HTCZ, too sensitive to hydralazine  On amlodipine  daily  BP goal < 160  Hyperlipidemia  Home meds: resumed in hospital  LDL 102, goal < 70  On lipitor .  Continue statin at discharge  Diabetes  HgbA1c 7.5, goal < 7.0  Uncontrolled  SSI  Home meds include insulin subq  CBG monitoring  Other Stroke Risk Factors  Advanced age  Hx stroke/TIA   Other Active Problems  Low B12 - on supplement  Left eye artificial  Hospital day # 6 Plan transfer to rehab bed today. Mobilize out of bed.   DISCHARGE  EXAM Blood pressure 131/76, pulse 97, temperature 97.8 F (36.6 C), temperature source Oral, resp. rate 20, height  (1.88 m), weight 102.059 kg (225 lb), SpO2 100 %.   General - Well nourished, well developed, elderly african american malefollowing commands.  Ophthalmologic - Fundi not visualized due to noncooperation.left eye is artificial  Cardiovascular - Regular rate and rhythm with no murmur.  Neuro Awake following commands consistently. Pupil reactive to light on right and left eye is prosthetic, right facial droop,moves all 4 limbs well against gravity and no drift. DTR 1+, no babinski.  Discharge Diet  DIET DYS 3 Room service appropriate?: Yes; Fluid  consistency:: Thin liquids  DISCHARGE PLAN  Disposition:  Transfer to [facility] for ongoing PT, OT and ST  no antithrombotic for secondary stroke prevention secondary to intracerebral hemorrhage.  Recommend ongoing risk factor control by Primary Care Physician at time of discharge from inpatient rehabilitation.  Follow-up HOLWERDA, SCOTT, MD in 2 weeks following discharge from rehab.  Follow-up with Dr. Delia Heady, Stroke Clinic in 2 months.   32 minutes were spent preparing discharge.  Delton See PA-C [facility] Pager [phone] 12/16/2014, 5:21 PM I have personally examined this patient, reviewed notes, independently viewed imaging studies, participated in medical decision making and plan of care. I have made any additions or clarifications directly to the above note. Agree with note above.   Delia Heady, MD Medical Director [facility] Pager: [phone] 12/16/2014 6:00 PM

## 2014-12-16 NOTE — Progress Notes (Signed)
Erick Colace, MD Physician Signed Physical Medicine and Rehabilitation Consult Note 12/12/2014 6:14 AM  Related encounter: ED to Hosp-Admission (Current) from 12/10/2014 in [facility] 34M NEURO MEDICAL    Expand All Collapse All        Physical Medicine and Rehabilitation Consult Reason for Consult: Nontraumatic Left frontal ICH  Referring Physician: Dr.Xu   HPI: Richard [name] is a 69 y.o. 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 By report patient lives with his wife independent prior to admission. Presented 12/10/2014 with aphasia and right sided weakness. No recent head trauma, no use of anticoagulation or antiplatelets. Systolic blood pressure greater than 200 in the ED. CT of the brain showed evidence of lobar left frontal ICH measuring 3.5 x 2.5 cm. Mild surrounding vasogenic edema but no hydrocephalus. MRI of the brain showed acute left posterior frontal intracerebral hemorrhage 30 x 24 x 32 mm favored to represent a lobar hypertensive bleed. No sign suggestive of vascular malformation or cortical venous thrombosis. Echocardiogram with ejection fraction 65% grade 1 diastolic dysfunction. Neurology consulted advise conservative care follow-up cranial CT scan stable. Tolerating a regular consistency diet. Close monitoring of blood pressure. Physical and occupational therapy evaluations completed with recommendations of physical medicine rehabilitation consult.  Patient onCIWA protocol, received lorazepam approximately 3 hours ago  Review of Systems  Unable to perform ROS: language   Past Medical History  Diagnosis Date  . Diabetes mellitus without complication   . Stroke    History reviewed. No pertinent past surgical history. No family history on file. Social History:  reports that he has never smoked. He does not have any smokeless tobacco history on file. He  reports that he drinks alcohol. His drug history is not on file. Allergies: No Known Allergies Medications Prior to Admission  Medication Sig Dispense Refill  . carvedilol (COREG) 25 MG tablet Take 25 mg by mouth 2 (two) times daily with a meal.    . insulin regular (NOVOLIN R,HUMULIN R) 100 units/mL injection Inject 100 Units into the skin 3 (three) times daily before meals.    Marland Kitchen lisinopril (PRINIVIL,ZESTRIL) 20 MG tablet Take 20 mg by mouth daily.    Marland Kitchen NOVOLIN N RELION 100 UNIT/ML injection Inject 30-40 Units into the skin daily before breakfast. Takes 40 units in the morning and 30-35 units at night    . olmesartan-hydrochlorothiazide (BENICAR HCT) 40-25 MG per tablet Take 1 tablet by mouth daily.      Home: Home Living Additional Comments: Unable to get history from pt, no family present  Functional History: Prior Function Comments: no family present to ascertain PLOF Functional Status:  Mobility: Bed Mobility Overal bed mobility: Needs Assistance Bed Mobility: Supine to Sit, Sit to Supine Supine to sit: Min assist Sit to supine: Min assist General bed mobility comments: min physical cues for direction and min truncal assist Transfers Overall transfer level: Needs assistance Equipment used: 1 person hand held assist Transfers: Sit to/from Stand Sit to Stand: Mod assist, +2 safety/equipment, +2 physical assistance General transfer comment: physical assist for direction due to no apparent verbal comprehension. Steady assist Ambulation/Gait Ambulation/Gait assistance: Mod assist, +2 safety/equipment, +2 physical assistance Ambulation Distance (Feet): 50 Feet Assistive device: 1 person hand held assist, 2 person hand held assist Gait Pattern/deviations: Step-through pattern, Scissoring, Ataxic, Wide base of support General Gait Details: pt with staggered, scissored wide BOS gait, Pt with R gaze preference, only looking R  if head physically turned  right. Pt hitting obstacles on the right and unable to problem solve how to get around the obstacle without physical direction. Gait velocity: slow and deliberate Gait velocity interpretation: Below normal speed for age/gender    ADL:    Cognition: Cognition Overall Cognitive Status: Difficult to assess Arousal/Alertness: Awake/alert Orientation Level: Other (comment) (Unable to assess) Attention: Focused Focused Attention: Appears intact Cognition Arousal/Alertness: Awake/alert Behavior During Therapy: Flat affect Overall Cognitive Status: Difficult to assess Difficult to assess due to: Impaired communication  Blood pressure 167/89, pulse 76, temperature 98.7 F (37.1 C), temperature source Oral, resp. rate 15, height 6\' 2"  (1.88 m), weight 102.059 kg (225 lb), SpO2 100 %. Physical Exam  HENT:  Right facial weakness  Eyes:  Pupils reactive to light without nystagmus  Neck: Normal range of motion. Neck supple. No thyromegaly present.  Cardiovascular: Normal rate and regular rhythm.  Respiratory: Effort normal and breath sounds normal. No respiratory distress.  GI: Soft. Bowel sounds are normal. He exhibits no distension.  Neurological:  Lethargic but arousable. Global aphasia. He would answer some spontaneous questions with yes but inconsistent. He did not follow commands during my exam. Withdrawal to deep stimuli  Skin: Skin is warm and dry.  Patient has Difficulty cooperating with manual muscle testing, grossly 2 minus right deltoid, biceps, triceps, grip, 3 minus right lower extremity. At least antigravity on the left side as well. Does not withdraw to pinch in all 4 limbs Patient arouses to sternal rub but not to verbal cues Tone appears normal in bilateral upper and lower limbs   Lab Results Last 24 Hours    Results for orders placed or performed during the hospital encounter of 12/10/14 (from the past 24 hour(s))  CBC Status: Abnormal   Collection Time:  12/11/14 7:25 AM  Result Value Ref Range   WBC 9.0 4.0 - 10.5 K/uL   RBC 4.37 4.22 - 5.81 MIL/uL   Hemoglobin 13.4 13.0 - 17.0 g/dL   HCT 16.1 (L) 09.6 - 04.5 %   MCV 89.0 78.0 - 100.0 fL   MCH 30.7 26.0 - 34.0 pg   MCHC 34.4 30.0 - 36.0 g/dL   RDW 40.9 81.1 - 91.4 %   Platelets 221 150 - 400 K/uL  Basic metabolic panel Status: Abnormal   Collection Time: 12/11/14 7:25 AM  Result Value Ref Range   Sodium 131 (L) 135 - 145 mmol/L   Potassium 3.1 (L) 3.5 - 5.1 mmol/L   Chloride 95 (L) 101 - 111 mmol/L   CO2 24 22 - 32 mmol/L   Glucose, Bld 271 (H) 65 - 99 mg/dL   BUN <5 (L) 6 - 20 mg/dL   Creatinine, Ser 7.82 0.61 - 1.24 mg/dL   Calcium 8.5 (L) 8.9 - 10.3 mg/dL   GFR calc non Af Amer >60 >60 mL/min   GFR calc Af Amer >60 >60 mL/min   Anion gap 12 5 - 15  Hemoglobin A1c Status: Abnormal   Collection Time: 12/11/14 7:25 AM  Result Value Ref Range   Hgb A1c MFr Bld 7.5 (H) 4.8 - 5.6 %   Mean Plasma Glucose 169 mg/dL  TSH Status: None   Collection Time: 12/11/14 7:25 AM  Result Value Ref Range   TSH 1.983 0.350 - 4.500 uIU/mL  Lipid panel Status: Abnormal   Collection Time: 12/11/14 7:25 AM  Result Value Ref Range   Cholesterol 187 0 - 200 mg/dL   Triglycerides 956 (H) <150 mg/dL  HDL 41 >40 mg/dL   Total CHOL/HDL Ratio 4.6 RATIO   VLDL 44 (H) 0 - 40 mg/dL   LDL Cholesterol 161 (H) 0 - 99 mg/dL  Vitamin W96 Status: None   Collection Time: 12/11/14 7:25 AM  Result Value Ref Range   Vitamin B-12 240 180 - 914 pg/mL  Glucose, capillary Status: Abnormal   Collection Time: 12/11/14 7:34 AM  Result Value Ref Range   Glucose-Capillary 284 (H) 65 - 99 mg/dL  Glucose, capillary Status: Abnormal   Collection Time: 12/11/14 11:29 AM  Result Value Ref Range   Glucose-Capillary  228 (H) 65 - 99 mg/dL  Urinalysis with microscopic Status: Abnormal   Collection Time: 12/11/14 1:07 PM  Result Value Ref Range   Color, Urine YELLOW YELLOW   APPearance CLEAR CLEAR   Specific Gravity, Urine 1.026 1.005 - 1.030   pH 7.0 5.0 - 8.0   Glucose, UA >1000 (A) NEGATIVE mg/dL   Hgb urine dipstick NEGATIVE NEGATIVE   Bilirubin Urine NEGATIVE NEGATIVE   Ketones, ur 40 (A) NEGATIVE mg/dL   Protein, ur NEGATIVE NEGATIVE mg/dL   Urobilinogen, UA 1.0 0.0 - 1.0 mg/dL   Nitrite NEGATIVE NEGATIVE   Leukocytes, UA NEGATIVE NEGATIVE   WBC, UA 0-2 <3 WBC/hpf   RBC / HPF 0-2 <3 RBC/hpf   Bacteria, UA FEW (A) RARE   Squamous Epithelial / LPF RARE RARE   Casts GRANULAR CAST (A) NEGATIVE  Urine rapid drug screen (hosp performed) Status: None   Collection Time: 12/11/14 1:07 PM  Result Value Ref Range   Opiates NONE DETECTED NONE DETECTED   Cocaine NONE DETECTED NONE DETECTED   Benzodiazepines NONE DETECTED NONE DETECTED   Amphetamines NONE DETECTED NONE DETECTED   Tetrahydrocannabinol NONE DETECTED NONE DETECTED   Barbiturates NONE DETECTED NONE DETECTED  Glucose, capillary Status: Abnormal   Collection Time: 12/11/14 4:04 PM  Result Value Ref Range   Glucose-Capillary 128 (H) 65 - 99 mg/dL  Sodium Status: Abnormal   Collection Time: 12/11/14 4:30 PM  Result Value Ref Range   Sodium 132 (L) 135 - 145 mmol/L  Glucose, capillary Status: Abnormal   Collection Time: 12/11/14 11:25 PM  Result Value Ref Range   Glucose-Capillary 209 (H) 65 - 99 mg/dL   Comment 1 Notify RN   CBC Status: Abnormal   Collection Time: 12/12/14 2:36 AM  Result Value Ref Range   WBC 8.5 4.0 - 10.5 K/uL   RBC 4.33 4.22 - 5.81 MIL/uL   Hemoglobin 13.0 13.0 - 17.0 g/dL   HCT 04.5 (L) 40.9 - 81.1 %   MCV 89.8 78.0 -  100.0 fL   MCH 30.0 26.0 - 34.0 pg   MCHC 33.4 30.0 - 36.0 g/dL   RDW 91.4 78.2 - 95.6 %   Platelets 210 150 - 400 K/uL  Basic metabolic panel Status: Abnormal   Collection Time: 12/12/14 2:36 AM  Result Value Ref Range   Sodium 128 (L) 135 - 145 mmol/L   Potassium 3.9 3.5 - 5.1 mmol/L   Chloride 96 (L) 101 - 111 mmol/L   CO2 23 22 - 32 mmol/L   Glucose, Bld 202 (H) 65 - 99 mg/dL   BUN 6 6 - 20 mg/dL   Creatinine, Ser 2.13 0.61 - 1.24 mg/dL   Calcium 8.4 (L) 8.9 - 10.3 mg/dL   GFR calc non Af Amer >60 >60 mL/min   GFR calc Af Amer >60 >60 mL/min   Anion gap 9 5 - 15  Imaging Results (Last 48 hours)    Ct Head Wo Contrast  12/11/2014 CLINICAL DATA: Follow-up left intracranial hemorrhage EXAM: CT HEAD WITHOUT CONTRAST TECHNIQUE: Contiguous axial images were obtained from the base of the skull through the vertex without intravenous contrast. COMPARISON: Yesterday FINDINGS: Skull and Sinuses:Chronic appearing sinusitis bilaterally. No acute finding. Orbits: Left enucleation with prosthesis. Brain: Unchanged peripheral hemispheric hematoma in the posterior left frontal region measuring up to 35 mm. As previously calculated, hematoma volume is approximately 12 cc. Rim of surrounding edema is stable and there is no progressive mass effect. No new site of hemorrhage. No subarachnoid or intraventricular extension. Extensive chronic small vessel disease with gliosis throughout the bilateral cerebral white matter. No newly seen infarct. IMPRESSION: Unchanged left frontal parenchymal hematoma, approximately 12 cc volume. Electronically Signed By: Marnee Spring M.D. On: 12/11/2014 11:53   Ct Head Wo Contrast  12/10/2014 CLINICAL DATA: Right facial droop and aphasia. Last seen normal last night. History of diabetes. Initial encounter. EXAM: CT HEAD WITHOUT CONTRAST TECHNIQUE: Contiguous axial images were  obtained from the base of the skull through the vertex without intravenous contrast. COMPARISON: None. FINDINGS: There is an acute hematoma within the posterior left frontal white matter, measuring 3.5 x 2.5 cm transverse on image 19. This has an estimated volume of approximately of 17.5 ml. There is mild surrounding vasogenic edema. There is loss of gray-white differentiation within the overlying cortex superiorly. No midline shift, hydrocephalus, extra-axial fluid collection or other areas of intracranial hemorrhage demonstrated. There is confluent low-density within the periventricular and subcortical white matter bilaterally. Intracranial vascular calcifications noted. There is mild ethmoid sinus mucosal thickening. Left ocular prosthesis noted. The visualized paranasal sinuses, mastoid air cells and middle ears are otherwise clear. The calvarium is intact. IMPRESSION: 1. Acute left frontal hemorrhage with surrounding edema, likely representing atypical hemorrhagic stroke. A hemorrhagic mass is considered less likely. CT or MR follow up suggested. 2. Confluent low-density in the periventricular and subcortical white matter consistent with chronic small vessel ischemic changes. 3. Critical Value/emergent results were called by telephone at the time of interpretation on 12/10/2014 at 9:40 am to Dr. Blake Divine , who verbally acknowledged these results. Electronically Signed By: Carey Bullocks M.D. On: 12/10/2014 09:42   Mr Maxine Glenn Head Wo Contrast  12/10/2014 CLINICAL DATA: RIGHT-sided facial droop, RIGHT-sided weakness, and expressive aphasia. Last seen normal 11 p.m. 12/09/2014. EXAM: MRI HEAD WITHOUT CONTRAST MRA HEAD WITHOUT CONTRAST TECHNIQUE: Multiplanar, multiecho pulse sequences of the brain and surrounding structures were obtained without intravenous contrast. Angiographic images of the head were obtained using MRA technique without contrast. COMPARISON: CT head earlier today.  FINDINGS: MRI HEAD FINDINGS There is an acute intracerebral hemorrhage, LEFT posterior frontal lobe, measuring 30 x 24 x 32 mm. Estimated volume of 12 mL. Mild surrounding edema. Restricted diffusion does not extend outside the hematoma, and is favored represent a primary intracerebral hemorrhage. No abnormal postcontrast enhancement within or surrounding the lesion. Moderate cerebral and cerebellar atrophy. Extensive T2 and FLAIR hyperintensity throughout periventricular and subcortical white matter likely chronic microvascular ischemic change. Remote chronic infarction RIGHT posterior frontal region, nonhemorrhagic. No foci of prior chronic hemorrhage. Flow voids are maintained. Prosthetic LEFT globe. Extracranial soft tissues unremarkable. Post infusion, no abnormal intra-axial enhancement. Major dural venous sinuses are patent. No signs of cortical venous thrombosis. Compared with recent CT, there may be slight clot retraction. MRA HEAD FINDINGS Internal carotid arteries widely patent. Basilar artery widely patent. RIGHT vertebral dominant. LEFT vertebral distal stenosis. No intracranial  stenosis or aneurysm. No visible vascular malformation. IMPRESSION: Acute LEFT posterior frontal intracerebral hemorrhage 30 x 24 x 32 mm, with estimated volume 12 mL. Mild surrounding edema. This is favored to represent a lobar hypertensive bleed. Cerebral amyloid angiopathy is not excluded. Doubt embolic infarction or hemorrhagic tumor. No sign suggestive of vascular malformation or cortical venous thrombosis. Moderate atrophy with extensive white matter signal abnormality, likely chronic microvascular ischemic change. Electronically Signed By: Elsie Stain M.D. On: 12/10/2014 12:33   Mr Laqueta Jean LG Contrast  12/10/2014 CLINICAL DATA: RIGHT-sided facial droop, RIGHT-sided weakness, and expressive aphasia. Last seen normal 11 p.m. 12/09/2014. EXAM: MRI HEAD WITHOUT CONTRAST MRA HEAD WITHOUT CONTRAST  TECHNIQUE: Multiplanar, multiecho pulse sequences of the brain and surrounding structures were obtained without intravenous contrast. Angiographic images of the head were obtained using MRA technique without contrast. COMPARISON: CT head earlier today. FINDINGS: MRI HEAD FINDINGS There is an acute intracerebral hemorrhage, LEFT posterior frontal lobe, measuring 30 x 24 x 32 mm. Estimated volume of 12 mL. Mild surrounding edema. Restricted diffusion does not extend outside the hematoma, and is favored represent a primary intracerebral hemorrhage. No abnormal postcontrast enhancement within or surrounding the lesion. Moderate cerebral and cerebellar atrophy. Extensive T2 and FLAIR hyperintensity throughout periventricular and subcortical white matter likely chronic microvascular ischemic change. Remote chronic infarction RIGHT posterior frontal region, nonhemorrhagic. No foci of prior chronic hemorrhage. Flow voids are maintained. Prosthetic LEFT globe. Extracranial soft tissues unremarkable. Post infusion, no abnormal intra-axial enhancement. Major dural venous sinuses are patent. No signs of cortical venous thrombosis. Compared with recent CT, there may be slight clot retraction. MRA HEAD FINDINGS Internal carotid arteries widely patent. Basilar artery widely patent. RIGHT vertebral dominant. LEFT vertebral distal stenosis. No intracranial stenosis or aneurysm. No visible vascular malformation. IMPRESSION: Acute LEFT posterior frontal intracerebral hemorrhage 30 x 24 x 32 mm, with estimated volume 12 mL. Mild surrounding edema. This is favored to represent a lobar hypertensive bleed. Cerebral amyloid angiopathy is not excluded. Doubt embolic infarction or hemorrhagic tumor. No sign suggestive of vascular malformation or cortical venous thrombosis. Moderate atrophy with extensive white matter signal abnormality, likely chronic microvascular ischemic change. Electronically Signed By: Elsie Stain  M.D. On: 12/10/2014 12:33   Dg Chest Port 1 View  12/11/2014 CLINICAL DATA: 69 year old male with a history of cough EXAM: PORTABLE CHEST - 1 VIEW COMPARISON: No prior chest x-ray FINDINGS: Cardiomediastinal silhouette within normal limits. Tortuosity of the thoracic aorta. No evidence of pulmonary vascular congestion. Low lung volumes accentuates the interstitium. No confluent airspace disease, pneumothorax, or pleural effusion. IMPRESSION: Low lung volumes with no radiographic evidence of acute cardiopulmonary disease. Signed, Yvone Neu. Loreta Ave, DO [facility] Electronically Signed By: Gilmer Mor D.O. On: 12/11/2014 08:07     Assessment/Plan: Diagnosis: Left posterior frontal intracranial hemorrhage with right hemiparesis and aphasia. Less alert today. Discussed with neurology, will have repeat CT scan, blood pressures are running higher will need IV Cardene drip 1. Does the need for close, 24 hr/day medical supervision in concert with the patient's rehab needs make it unreasonable for this patient to be served in a less intensive setting? Yes 2. Co-Morbidities requiring supervision/potential complications: Alcohol abuse, dysphasia, diabetes with peripheral neuropathy, history of left prosthetic eye 3. Due to bladder management, bowel management, safety, skin/wound care, disease management, medication administration, pain management and patient education, does the patient require 24 hr/day rehab nursing? Yes 4. Does the patient require coordinated care of a physician, rehab  nurse, PT (1-2 hrs/day, 5 days/week), OT (12 hrs/day, 5 days/week) and SLP (0.5-1 hrs/day, 5 days/week) to address physical and functional deficits in the context of the above medical diagnosis(es)? Yes Addressing deficits in the following areas: balance, endurance, locomotion, strength, transferring, bowel/bladder control, bathing, dressing,  feeding, grooming, toileting, cognition, speech, language, swallowing and psychosocial support 5. Can the patient actively participate in an intensive therapy program of at least 3 hrs of therapy per day at least 5 days per week? No 6. The potential for patient to make measurable gains while on inpatient rehab is good once above issues are resolved 7. Anticipated functional outcomes upon discharge from inpatient rehab are supervision with PT, supervision with OT, supervision with SLP. 8. Estimated rehab length of stay to reach the above functional goals is: 18-22 days 9. Does the patient have adequate social supports and living environment to accommodate these discharge functional goals? Potentially 10. Anticipated D/C setting: Home 11. Anticipated post D/C treatments: HH therapy 12. Overall Rehab/Functional Prognosis: good  RECOMMENDATIONS: This patient's condition is appropriate for continued rehabilitative care in the following setting: CIR once medically and neurologically stable Patient has agreed to participate in recommended program. N/A Note that insurance prior authorization may be required for reimbursement for recommended care.  Comment: Needs IV blood pressure medicines, we'll need to see whether patient has had a new event and how this affects him from a neurological and functional standpoint    12/12/2014       Revision History     Date/Time User Provider Type Action   12/12/2014 9:40 AM Erick Colace, MD Physician Sign   12/12/2014 6:32 AM Charlton Amor, PA-C Physician Assistant Pend   View Details Report       Routing History     Date/Time From To Method   12/12/2014 9:40 AM Erick Colace, MD Erick Colace, MD In Basket   12/12/2014 9:40 AM Erick Colace, MD Alysia Penna, MD In Basket

## 2014-12-16 NOTE — PMR Pre-admission (Signed)
PMR Admission Coordinator Pre-Admission Assessment  Patient: Richard [name] is an 69 y.o., male MRN: [id number] DOB: [birth year] Height: $RemoveBeforeDE'6\' 2"'zEWveIvwgUEQiCt$  (188 cm) Weight: 102.059 kg (225 lb)              Insurance Information HMO:    PPO:  Yes     PCP:       IPA:       80/20:       OTHER:   PRIMARY: [employer] Medicare      Policy#: [id number]      Subscriber: [employer] CM Name: Maylon Peppers      Phone#: [phone]     Fax#: [phone] Pre-Cert#: [id number] X 7days initially with update due on 12/22/14     Employer:  Retired Benefits:  Phone #: [phone]     Name: Cherly Anderson. Date: 03/28/14     Deduct: $0      Out of Pocket Max: $4900 (met $45.28)      Life Max: unlimited CIR: $285 days 1-6      SNF:  $0 days 1-20; $160 days 21-100 Outpatient: medical necessity     Co-Pay: $40 copay Home Health: 100%      Co-Pay: none DME: 80%     Co-Pay: 20% Providers: in network  Medicaid Application Date:        Case Manager:   Disability Application Date:        Case Worker:    Emergency Facilities manager Information    Name Relation Home Work [place] Spouse [phone]     Mays,Basil Son [phone]  [phone]     Current Medical History  Patient Admitting Diagnosis: L posterior frontal ICH   History of Present Illness: A 69 y.o. 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 By report patient lives with his wife independent prior to admission. Presented 12/10/2014 with aphasia and right sided weakness. No recent head trauma, no use of anticoagulation or antiplatelets. Systolic blood pressure greater than 200 in the ED. CT of the brain showed evidence of lobar left frontal ICH measuring 3.5 x 2.5 cm. Mild surrounding vasogenic edema but no hydrocephalus. MRI of the brain showed acute left posterior frontal intracerebral hemorrhage 30 x 24 x 32 mm favored to represent a lobar  hypertensive bleed. No sign suggestive of vascular malformation or cortical venous thrombosis. Patient did require intubation for a short time due to acute respiratory failure and extubated 12/14/2014. Echocardiogram with ejection fraction 09% grade 1 diastolic dysfunction. Carotid Dopplers at no ICA stenosis. EEG with moderately severe generalized continuous nonspecific slowing consistent with encephalopathy. No evidence of seizure. Neurology consulted advise conservative care follow-up cranial CT scan stable. Tolerating a regular consistency diet. Close monitoring of blood pressure. Subcutaneous heparin initiated for DVT prophylaxis 12/12/2014. Physical and occupational therapy evaluations completed with recommendations of physical medicine rehabilitation consult. Patient was admitted for a comprehensive rehabilitation program    Total: 10=NIH  Past Medical History  Past Medical History  Diagnosis Date  . Diabetes mellitus without complication   . Stroke     Family History  family history is not on file.  Prior Rehab/Hospitalizations: No previous rehab admissions.  Has the patient had major surgery during 100 days prior to admission? No  Current Medications   Current facility-administered medications:  .   stroke: mapping our early stages of recovery book, , Does not apply, Once, Amie Portland, MD .  acetaminophen (TYLENOL) tablet 650 mg, 650 mg, Oral, Q4H PRN, 650 mg at 12/15/14 2311 **OR** acetaminophen (TYLENOL) suppository 650 mg, 650 mg, Rectal, Q4H PRN, Md Pccm, MD .  amLODipine (NORVASC) tablet 10 mg, 10 mg, Oral, Daily, Rosalin Hawking, MD, 10 mg at 12/16/14 0925 .  atorvastatin (LIPITOR) tablet 10 mg, 10 mg, Oral, q1800, Anders Simmonds, MD .  feeding supplement (ENSURE ENLIVE) (ENSURE ENLIVE) liquid 237 mL, 237 mL, Oral, BID BM, Asencion Islam, RD, 237 mL at 12/16/14 0958 .  folic acid (FOLVITE) tablet 1 mg, 1 mg, Oral, Daily, Anders Simmonds, MD, 1 mg at 12/16/14 0925 .   heparin injection 5,000 Units, 5,000 Units, Subcutaneous, 3 times per day, Rosalin Hawking, MD, 5,000 Units at 12/16/14 0513 .  irbesartan (AVAPRO) tablet 300 mg, 300 mg, Oral, Daily, 300 mg at 12/16/14 0925 **AND** hydrochlorothiazide (HYDRODIURIL) tablet 25 mg, 25 mg, Oral, Daily, Garvin Fila, MD, 25 mg at 12/16/14 0925 .  insulin aspart (novoLOG) injection 0-5 Units, 0-5 Units, Subcutaneous, QHS, Javier Glazier, MD .  insulin aspart (novoLOG) injection 0-9 Units, 0-9 Units, Subcutaneous, TID WC, Javier Glazier, MD .  insulin aspart (novoLOG) injection 5 Units, 5 Units, Subcutaneous, TID WC, Javier Glazier, MD .  insulin glargine (LANTUS) injection 10 Units, 10 Units, Subcutaneous, QHS, Javier Glazier, MD .  multivitamin with minerals tablet 1 tablet, 1 tablet, Oral, Daily, Anders Simmonds, MD, 1 tablet at 12/16/14 [phone] .  senna-docusate (Senokot-S) tablet 1 tablet, 1 tablet, Oral, BID, Anders Simmonds, MD, 1 tablet at 12/16/14 [phone] .  thiamine (VITAMIN B-1) tablet 100 mg, 100 mg, Oral, Daily, 100 mg at 12/16/14 0925 **OR** [DISCONTINUED] thiamine (B-1) injection 100 mg, 100 mg, Intravenous, Daily, Anders Simmonds, MD .  vitamin B-12 (CYANOCOBALAMIN) tablet 100 mcg, 100 mcg, Oral, Daily, Anders Simmonds, MD, 100 mcg at 12/16/14 5409  Patients Current Diet: DIET DYS 3 Room service appropriate?: Yes; Fluid consistency:: Thin  Precautions / Restrictions Precautions Precautions: Fall Restrictions Weight Bearing Restrictions: No   Has the patient had 2 or more falls or a fall with injury in the past year?No  Prior Activity Level Community (5-7x/wk): Went out daily for at least 30 minutes each day.  Was driving per wife.  Home Assistive Devices / Equipment Home Assistive Devices/Equipment: None  Prior Device Use: Indicate devices/aids used by the patient prior to current illness, exacerbation or injury? None  Prior Functional Level Prior Function Level of Independence:  (no  family available to assess) Comments: no family present to ascertain PLOF  Self Care: Did the patient need help bathing, dressing, using the toilet or eating?  Independent  Indoor Mobility: Did the patient need assistance with walking from room to room (with or without device)? Independent  Stairs: Did the patient need assistance with internal or external stairs (with or without device)? Independent  Functional Cognition: Did the patient need help planning regular tasks such as shopping or remembering to take medications? Independent  Current Functional Level Cognition  Arousal/Alertness: Awake/alert Overall Cognitive Status: Impaired/Different from baseline Difficult to assess due to: Impaired communication Orientation Level: Oriented to person, Oriented to place, Oriented to situation, Oriented X4, Oriented to time Safety/Judgement: Decreased awareness of deficits, Decreased awareness of safety Attention: Sustained Focused Attention: Appears intact Sustained Attention: Appears intact Memory:  (TBD) Awareness: Impaired Awareness Impairment: Emergent impairment Problem Solving: Impaired Problem Solving Impairment: Functional complex Comments: difficult to fully assess due to aphasia  Extremity Assessment (includes Sensation/Coordination)  Upper Extremity Assessment: RUE deficits/detail, LUE deficits/detail RUE Deficits / Details: moving spontaneously but not functionally following commands. will further assess RUE Coordination: decreased fine motor, decreased gross motor LUE Deficits / Details: moving spontaneously but not functionally; not following commands LUE Coordination: decreased fine motor, decreased gross motor  Lower Extremity Assessment: Defer to PT evaluation RLE Deficits / Details: moving against gravity, but not coordinated.  Unable to MMT due to unable to follow direction/  Gross extension 4+/5 RLE Coordination: decreased fine motor LLE Deficits / Details:  appears WFL without formal MMT    ADLs  Overall ADL's : Needs assistance/impaired General ADL Comments: total Assist    Mobility  Overal bed mobility: Needs Assistance, +2 for physical assistance Bed Mobility: Supine to Sit, Sit to Supine Supine to sit: Total assist, +2 for physical assistance Sit to supine: Min assist General bed mobility comments: cuess for direction    Transfers  Overall transfer level: Needs assistance Equipment used: Rolling walker (2 wheeled) Transfers: Sit to/from Stand Sit to Stand: Min assist, Mod assist General transfer comment: + 2 for safety, but pt not needing much help to rise    Ambulation / Gait / Stairs / Wheelchair Mobility  Ambulation/Gait Ambulation/Gait assistance: Mod assist, Min assist, +2 safety/equipment Ambulation Distance (Feet): 300 Feet Assistive device: Rolling walker (2 wheeled), None Gait Pattern/deviations: Step-through pattern, Decreased dorsiflexion - right, Wide base of support General Gait Details: impaired safety with walker and right UE awarness limited; cues and manual assist to place on walker, one episode of right hand falling off walker and near loss of balance mod assist to recover,  assist on turns, around obstacels for safety with walker,  in room no device, min/mod assist for safety , cues for upright posture. Gait velocity: slow and deliberate Gait velocity interpretation: Below normal speed for age/gender    Posture / Balance Dynamic Sitting Balance Sitting balance - Comments: pt resistant to effort to get him to bring his trunk forward off the bed and sitting up on side of bed.  2 person assist to get pt fully upright for 3-5 min and abruptly pt went from significant effort to "asleep" with snoring in less than 10 secs.  BP showed that pt had passed out with BP of 54/38.  Pt returned to supine Balance Overall balance assessment: Needs assistance Sitting-balance support: Bilateral upper extremity supported, Single  extremity supported Sitting balance-Leahy Scale: Fair Sitting balance - Comments: pt resistant to effort to get him to bring his trunk forward off the bed and sitting up on side of bed.  2 person assist to get pt fully upright for 3-5 min and abruptly pt went from significant effort to "asleep" with snoring in less than 10 secs.  BP showed that pt had passed out with BP of 54/38.  Pt returned to supine Standing balance-Leahy Scale: Poor    Special needs/care consideration BiPAP/CPAP No CPM No Continuous Drip IV No Dialysis No        Life Vest No Oxygen No Special Bed No Trach Size No Wound Vac (area) No     Skin No                              Bowel mgmt: Last BM 12/10/14 with incontinence Bladder mgmt: Urinary catheter in place for urinary retention Diabetic mgmt Yes, on insulin at home    Previous Home Environment Living Arrangements: Spouse/significant  other Home Care Services: No Additional Comments: Unable to get history from pt, no family present  Discharge Living Setting Plans for Discharge Living Setting: Patient's home, House, Lives with (comment) (Lives with wife, sister-in-law and 16 yo son.) Type of Home at Discharge: House Discharge Home Layout: Two level, Able to live on main level with bedroom/bathroom Alternate Level Stairs-Number of Steps: 13 Discharge Home Access: Stairs to enter Entrance Stairs-Number of Steps: 2 Does the patient have any problems obtaining your medications?: No  Social/Family/Support Systems Patient Roles: Spouse, Parent (Has a wife, daughter and a son.) Contact Information: Damean Poffenberger - wife [phone] Anticipated Caregiver: wife, son Anticipated Caregiver's Contact Information: Archie Patten - son [phone] Ability/Limitations of Caregiver: Wife is homebound and can provide supervision.  Son works and stays with patient at night. Caregiver Availability: 24/7 Discharge Plan Discussed with Primary Caregiver: Yes Is Caregiver In  Agreement with Plan?: Yes Does Caregiver/Family have Issues with Lodging/Transportation while Pt is in Rehab?: No  Goals/Additional Needs Patient/Family Goal for Rehab: PT/OT/ST supervision goals Expected length of stay: 18-22 days Cultural Considerations: None Dietary Needs: Dys 3 carb modified heart healthy thin liquids Equipment Needs: TBD Pt/Family Agrees to Admission and willing to participate: Yes Program Orientation Provided & Reviewed with Pt/Caregiver Including Roles  & Responsibilities: Yes  Decrease burden of Care through IP rehab admission:  N/A  Possible need for SNF placement upon discharge: Not anticipated  Patient Condition: This patient's medical and functional status has changed since the consult dated: 12/12/14 in which the Rehabilitation Physician determined and documented that the patient's condition is appropriate for intensive rehabilitative care in an inpatient rehabilitation facility. See "History of Present Illness" (above) for medical update. Functional changes are: Currently requiring min/mod assist for transfers and min/mod assist to ambulate 300 ft RW Patient's medical and functional status update has been discussed with the Rehabilitation physician and patient remains appropriate for inpatient rehabilitation. Will admit to inpatient rehab today.  Preadmission Screen Completed By:  [name], 12/16/2014 11:47 AM ______________________________________________________________________   Discussed status with Dr. Naaman Plummer on 12/16/14 at 1146 and received telephone approval for admission today.  Admission Coordinator:  [name], time1146/Date09/20/16

## 2014-12-16 NOTE — Care Management Important Message (Signed)
Important Message  Patient Details  Name: Richard [name] MRN: [id number] Date of Birth: 1946/02/04   Medicare Important Message Given:  Yes-third notification given    Orson Aloe 12/16/2014, 11:27 AM

## 2014-12-16 NOTE — Progress Notes (Signed)
Name: Richard [name] MRN: [id number] DOB: 12/21/1945    ADMISSION DATE:  12/10/2014 CONSULTATION DATE:  12/16/2014  REFERRING MD :  Roda Shutters, neuro   CHIEF COMPLAINT:  Altered MS  BRIEF PATIENT DESCRIPTION: 69 year old diabetic, history of CVA presented 9/14 with aphasia and right-sided weakness, CT showed left frontal ICH. He remained encephalopathic, placed on Ativan per CIWA protocol for agitation, PCCM consulted 9/16 for hyponatremia and persistent encephalopathy  SIGNIFICANT EVENTS  9/16 CIR consult 9/18 Extubated  STUDIES:  9/14 head CT-3.52.5 cm left frontal hemorrhage 9/14 MRI- 3 cm left frontal hemorrhage 9/15 echo-normal LV function 9/16 head CT stable left frontal hematoma  SUBJECTIVE: Patient remains slightly confused. He denies any dyspnea or cough. He denies any headache. He was transferred to the floor yesterday and has done well with his respiratory status now 2 days post extubation.  ROS: Difficult to obtain accurate review of systems given ongoing issue with delirium. However, the patient denies any chest pain or pressure. He denies any nausea or vomiting.  VITAL SIGNS: Temp:  [97.8 F (36.6 C)-99.2 F (37.3 C)] 98.5 F (36.9 C) (09/20 0927) Pulse Rate:  [88-106] 95 (09/20 0927) Resp:  [17-21] 20 (09/20 0927) BP: (115-163)/(68-86) 150/79 mmHg (09/20 0927) SpO2:  [95 %-100 %] 95 % (09/20 0927)  PHYSICAL EXAMINATION: General:  Awake. Alert. No acute distress.   Integument:  Warm & dry. No rash on exposed skin.  HEENT:  Moist mucus membranes. No oral ulcers. No scleral injection or icterus.  Cardiovascular:  Regular rate. No edema.  Pulmonary:  Good aeration & clear to auscultation bilaterally. No accessory muscle use. Abdomen: Soft. Normal bowel sounds. Nondistended. Grossly nontender. Neurological:  Right facial droop persists. No meningismus.  Psychiatric: Patient cooperative. Oriented to person, president, and place. Not oriented to season or year.   Recent  Labs Lab 12/14/14 0227 12/14/14 1709 12/15/14 0214  NA 136 133* 135  K 4.4 3.7 4.2  CL 105 101 104  CO2 20* 19* 17*  BUN CREATININE 1.27* 1.10 1.17  GLUCOSE 153* 127* 120*    Recent Labs Lab 12/11/14 0725 12/12/14 0236 12/13/14 0224  HGB 13.4 13.0 12.4*  HCT 38.9* 38.9* 36.6*  WBC 9.0 8.5 11.0*  PLT 221 210 189   No results found.   ASSESSMENT / PLAN: 69 year old male with alcohol withdrawal and intracerebral hemorrhage successfully transitioned off Precedex to CIWA  protocol. Mental status is improving. Hyponatremia improving as well. Patient's insulin regimen has been adjusted per the diabetes care coordinator recommendations. At this time as the patient continues to remain stable we will sign off.  1. Alcohol withdrawal: Currently on CIWA protocol. Off Precedex. 2. Hypertension: Continuing amlodipine, Avapro, & HCTZ. Has when necessary IV labetalol. Antihypertensive regimen management per primary service. 3. Hyponatremia: Resolved. Repeat BMP in a.m. 4. Acute encephalopathy: Clinically improving. Recommend limiting sedating medications. Patient will remain on CIWA protocol. Folic acid & thiamine by mouth daily. 5. ICH:  Management per primary service. 6. Acute hypoxia: Resolved. Status post extubation 48 hours. 7. Diabetes mellitus: Continuing Lantus at 10 units qhs & starting mealtime NovoLog coverage 5 units 3 times a day with sliding scale correction accordingly before meals & at bedtime per algorithm. 8. Prophylaxis:  Heparin SCq8hr. 9. Diet:  Dysphagia 3 diet.  At this time I will sign off. Please contact us if we can be of any further assistance in the care of this patient.  Donna Christen Jamison Neighbor, M.D. [facility]  Pager:  [phone] After 3pm or if no response, call [phone] 12/16/2014, 10:14 AM

## 2014-12-16 NOTE — Progress Notes (Signed)
Rehab admissions - I have spoken with insurance case Production designer, theatre[employer]tele[employer].  We should have a decision about inpatient rehab this afternoon.  I anticipate admitting patient to inpatient rehab later today.  Call me for questions.  #[phone]

## 2014-12-16 NOTE — Progress Notes (Signed)
Rehab admissions - I have approval from insurance carrier for acute inpatient rehab admission for today.  Bed available and will admit to inpatient rehab today.  Call me for questions.  #[phone] 

## 2014-12-16 NOTE — H&P (View-Only) (Signed)
Physical Medicine and Rehabilitation Admission H&P    Chief Complaint  Patient presents with  . Facial Droop  : HPI: Richard [name] is a 69 y.o. 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 By report patient lives with his wife independent prior to admission. Presented 12/10/2014 with aphasia and right sided weakness. No recent head trauma, no use of anticoagulation or antiplatelets. Systolic blood pressure greater than 200 in the ED. CT of the brain showed evidence of lobar left frontal ICH measuring 3.5 x 2.5 cm. Mild surrounding vasogenic edema but no hydrocephalus. MRI of the brain showed acute left posterior frontal intracerebral hemorrhage 30 x 24 x 32 mm favored to represent a lobar hypertensive bleed. No sign suggestive of vascular malformation or cortical venous thrombosis. Patient did require intubation for a short time due to acute respiratory failure and extubated 12/14/2014. Echocardiogram with ejection fraction 83% grade 1 diastolic dysfunction. Carotid Dopplers at no ICA stenosis. EEG with moderately severe generalized continuous nonspecific slowing consistent with encephalopathy. No evidence of seizure. Neurology consulted advise conservative care follow-up cranial CT scan stable. Tolerating a regular consistency diet. Close monitoring of blood pressure. Subcutaneous heparin initiated for DVT prophylaxis 12/12/2014. Physical and occupational therapy evaluations completed with recommendations of physical medicine rehabilitation consult. Patient was admitted for a comprehensive rehabilitation program  ROS Review of Systems  Unable to perform ROS: language    Past Medical History  Diagnosis Date  . Diabetes mellitus without complication   . Stroke    History reviewed. No pertinent past surgical history. History reviewed. No pertinent family history. Social History:  reports  that he has never smoked. He does not have any smokeless tobacco history on file. He reports that he drinks alcohol. His drug history is not on file. Allergies: No Known Allergies Medications Prior to Admission  Medication Sig Dispense Refill  . carvedilol (COREG) 25 MG tablet Take 25 mg by mouth 2 (two) times daily with a meal.    . insulin regular (NOVOLIN R,HUMULIN R) 100 units/mL injection Inject 100 Units into the skin 3 (three) times daily before meals.    Marland Kitchen lisinopril (PRINIVIL,ZESTRIL) 20 MG tablet Take 20 mg by mouth daily.    Marland Kitchen NOVOLIN N RELION 100 UNIT/ML injection Inject 30-40 Units into the skin daily before breakfast. Takes 40 units in the morning and 30-35 units at night    . olmesartan-hydrochlorothiazide (BENICAR HCT) 40-25 MG per tablet Take 1 tablet by mouth daily.      Home: Home Living Family/patient expects to be discharged to:: Unsure Living Arrangements: Spouse/significant other Additional Comments: Unable to get history from pt, no family present   Functional History: Prior Function Level of Independence:  (no family available to assess) Comments: no family present to ascertain PLOF  Functional Status:  Mobility: Bed Mobility Overal bed mobility: Needs Assistance, +2 for physical assistance Bed Mobility: Supine to Sit, Sit to Supine Supine to sit: Total assist, +2 for physical assistance Sit to supine: Min assist General bed mobility comments: cuess for direction Transfers Overall transfer level: Needs assistance Equipment used: Rolling walker (2 wheeled) Transfers: Sit to/from Stand Sit to Stand: Min assist, Mod assist General transfer comment: + 2 for safety, but pt not needing much help to rise Ambulation/Gait Ambulation/Gait assistance: Mod assist, Min assist, +2 safety/equipment Ambulation Distance (Feet): 300 Feet Assistive device: Rolling walker (2 wheeled), None Gait Pattern/deviations: Step-through pattern, Decreased dorsiflexion -  right, Wide  base of support General Gait Details: impaired safety with walker and right UE awarness limited; cues and manual assist to place on walker, one episode of right hand falling off walker and near loss of balance mod assist to recover,  assist on turns, around obstacels for safety with walker,  in room no device, min/mod assist for safety , cues for upright posture. Gait velocity: slow and deliberate Gait velocity interpretation: Below normal speed for age/gender    ADL: ADL Overall ADL's : Needs assistance/impaired General ADL Comments: total Assist  Cognition: Cognition Overall Cognitive Status: Impaired/Different from baseline Arousal/Alertness: Awake/alert Orientation Level: Oriented to person, Oriented to place, Oriented to situation, Oriented X4, Disoriented to time Attention: Sustained Focused Attention: Appears intact Sustained Attention: Appears intact Memory:  (TBD) Awareness: Impaired Awareness Impairment: Emergent impairment Problem Solving: Impaired Problem Solving Impairment: Functional complex Comments: difficult to fully assess due to aphasia Cognition Arousal/Alertness: Awake/alert Behavior During Therapy: Impulsive Overall Cognitive Status: Impaired/Different from baseline Area of Impairment: Safety/judgement, Awareness, Problem solving Safety/Judgement: Decreased awareness of deficits, Decreased awareness of safety Awareness: Intellectual Problem Solving: Difficulty sequencing, Requires verbal cues Difficult to assess due to: Impaired communication  Physical Exam: Blood pressure 161/86, pulse 95, temperature 97.8 F (36.6 C), temperature source Oral, resp. rate 18, height _0  (1.88 m), weight 102.059 kg (225 lb), SpO2 99 %. Physical Exam HENT:  Right facial weakness  Eyes:   artificial left eye, lid lag Neck: Normal range of motion. Neck supple. No thyromegaly present.  Cardiovascular: Normal rate and regular rhythm.  Respiratory: Effort normal and  breath sounds normal. No respiratory distress.  GI: Soft. Bowel sounds are normal. He exhibits no distension.  Neurological: Pt is alert sitting in bed. Right facial weakness. Oriented to self, place, could recall month given cues. Has delays in comprehension and expression. Able to follow simple commands with tactile and visual cueing. Can be perseverative.  RUE: 3+/5 deltoid, biceps, triceps, HI, RLE: 3+ hf, ke and 4/5 adf/apf. Decrease LT but senses pain on right.  Skin: without breakdown. Psych: pleasant and generally cooperative   Results for orders placed or performed during the hospital encounter of 12/10/14 (from the past 48 hour(s))  Glucose, capillary     Status: Abnormal   Collection Time: 12/14/14  8:13 AM  Result Value Ref Range   Glucose-Capillary 142 (H) 65 - 99 mg/dL   Comment 1 Notify RN    Comment 2 Document in Chart   Glucose, capillary     Status: Abnormal   Collection Time: 12/14/14 11:37 AM  Result Value Ref Range   Glucose-Capillary 129 (H) 65 - 99 mg/dL   Comment 1 Notify RN    Comment 2 Document in Chart   Glucose, capillary     Status: Abnormal   Collection Time: 12/14/14  3:31 PM  Result Value Ref Range   Glucose-Capillary 114 (H) 65 - 99 mg/dL   Comment 1 Notify RN   Basic metabolic panel     Status: Abnormal   Collection Time: 12/14/14  5:09 PM  Result Value Ref Range   Sodium 133 (L) 135 - 145 mmol/L   Potassium 3.7 3.5 - 5.1 mmol/L   Chloride 101 101 - 111 mmol/L   CO2 19 (L) 22 - 32 mmol/L   Glucose, Bld 127 (H) 65 - 99 mg/dL   BUN 15 6 - 20 mg/dL   Creatinine, Ser 1.10 0.61 - 1.24 mg/dL   Calcium 8.4 (L) 8.9 - 10.3 mg/dL  GFR calc non Af Amer >60 >60 mL/min   GFR calc Af Amer >60 >60 mL/min    Comment: (NOTE) The eGFR has been calculated using the CKD EPI equation. This calculation has not been validated in all clinical situations. eGFR's persistently <60 mL/min signify possible Chronic Kidney Disease.    Anion gap 13 5 - 15  Glucose,  capillary     Status: Abnormal   Collection Time: 12/14/14  7:47 PM  Result Value Ref Range   Glucose-Capillary 128 (H) 65 - 99 mg/dL  Glucose, capillary     Status: Abnormal   Collection Time: 12/15/14 12:17 AM  Result Value Ref Range   Glucose-Capillary 134 (H) 65 - 99 mg/dL  Basic metabolic panel     Status: Abnormal   Collection Time: 12/15/14  2:14 AM  Result Value Ref Range   Sodium 135 135 - 145 mmol/L   Potassium 4.2 3.5 - 5.1 mmol/L    Comment: SPECIMEN HEMOLYZED. HEMOLYSIS MAY AFFECT INTEGRITY OF RESULTS.   Chloride 104 101 - 111 mmol/L   CO2 17 (L) 22 - 32 mmol/L   Glucose, Bld 120 (H) 65 - 99 mg/dL   BUN 12 6 - 20 mg/dL   Creatinine, Ser 1.17 0.61 - 1.24 mg/dL   Calcium 8.8 (L) 8.9 - 10.3 mg/dL   GFR calc non Af Amer >60 >60 mL/min   GFR calc Af Amer >60 >60 mL/min    Comment: (NOTE) The eGFR has been calculated using the CKD EPI equation. This calculation has not been validated in all clinical situations. eGFR's persistently <60 mL/min signify possible Chronic Kidney Disease.    Anion gap 14 5 - 15  Glucose, capillary     Status: Abnormal   Collection Time: 12/15/14  3:46 AM  Result Value Ref Range   Glucose-Capillary 133 (H) 65 - 99 mg/dL  Glucose, capillary     Status: Abnormal   Collection Time: 12/15/14  7:37 AM  Result Value Ref Range   Glucose-Capillary 140 (H) 65 - 99 mg/dL  Glucose, capillary     Status: Abnormal   Collection Time: 12/15/14 11:20 AM  Result Value Ref Range   Glucose-Capillary 304 (H) 65 - 99 mg/dL  Glucose, capillary     Status: Abnormal   Collection Time: 12/15/14  3:52 PM  Result Value Ref Range   Glucose-Capillary 149 (H) 65 - 99 mg/dL  Glucose, capillary     Status: Abnormal   Collection Time: 12/15/14  7:57 PM  Result Value Ref Range   Glucose-Capillary 224 (H) 65 - 99 mg/dL   Comment 1 Notify RN    Comment 2 Document in Chart   Glucose, capillary     Status: Abnormal   Collection Time: 12/16/14 12:15 AM  Result Value Ref  Range   Glucose-Capillary 120 (H) 65 - 99 mg/dL   Comment 1 Notify RN    Comment 2 Document in Chart   Glucose, capillary     Status: Abnormal   Collection Time: 12/16/14  5:05 AM  Result Value Ref Range   Glucose-Capillary 159 (H) 65 - 99 mg/dL   Comment 1 Notify RN    Comment 2 Document in Chart    No results found.     Medical Problem List and Plan: 1. Functional deficits secondary to left posterior frontal intracranial hemorrhage felt to be secondary to hypertensive crisis 2.  DVT Prophylaxis/Anticoagulation: Subcutaneous heparin initiated 12/12/2014. Monitor platelet counts and any signs of bleeding 3. Pain Management: Tylenol  as needed 4. Acute respiratory failure. Extubated 12/14/2014. Comfortable/no distress currently 5. Neuropsych: This patient is capable of making decisions on his own behalf. 6. Skin/Wound Care: Routine skin checks 7. Fluids/Electrolytes/Nutrition: Routine I&O with follow-up chemistries in am 8. Hypertension. Norvasc 10 mg daily, Avapro 300 mg daily, hydrochlorothiazide 25 mg daily. Monitor with increased mobility---fair control at present 9. Diabetes mellitus of peripheral neuropathy. Hemoglobin A1c 7.5. Lantus insulin 10 units daily. Check blood sugars before meals and at bedtime.  -need to follow for consistent pattern before adjusting regimen 10. History of alcohol use. Provide counseling. Monitor for any signs of withdrawal 11. Hyperlipidemia. Lipitor  Post Admission Physician Evaluation: 1. Functional deficits secondary  to left posterior-frontal ICH. 2. Patient is admitted to receive collaborative, interdisciplinary care between the physiatrist, rehab nursing staff, and therapy team. 3. Patient's level of medical complexity and substantial therapy needs in context of that medical necessity cannot be provided at a lesser intensity of care such as a SNF. 4. Patient has experienced substantial functional loss from his/her baseline which was documented  above under the "Functional History" and "Functional Status" headings.  Judging by the patient's diagnosis, physical exam, and functional history, the patient has potential for functional progress which will result in measurable gains while on inpatient rehab.  These gains will be of substantial and practical use upon discharge  in facilitating mobility and self-care at the household level. 5. Physiatrist will provide 24 hour management of medical needs as well as oversight of the therapy plan/treatment and provide guidance as appropriate regarding the interaction of the two. 6. 24 hour rehab nursing will assist with bladder management, bowel management, safety, skin/wound care, disease management, medication administration, pain management and patient education  and help integrate therapy concepts, techniques,education, etc. 7. PT will assess and treat for/with: Lower extremity strength, range of motion, stamina, balance, functional mobility, safety, adaptive techniques and equipment, NMR, visual-perceptual and cognitive percceptual rx.   Goals are: supervision to mod I. 8. OT will assess and treat for/with: ADL's, functional mobility, safety, upper extremity strength, adaptive techniques and equipment, NMR, visual-perceptual rx.   Goals are: supervision. Therapy may proceed with showering this patient. 9. SLP will assess and treat for/with: cognition, memory , language.  Goals are: supervision to min assist. 10. Case Management and Social Worker will assess and treat for psychological issues and discharge planning. 11. Team conference will be held weekly to assess progress toward goals and to determine barriers to discharge. 12. Patient will receive at least 3 hours of therapy per day at least 5 days per week. 13. ELOS: 8-12 days       14. Prognosis:  excellent     Meredith Staggers, MD, [facility] 12/16/2014   12/16/2014

## 2014-12-16 NOTE — H&P (Signed)
Physical Medicine and Rehabilitation Admission H&P    Chief Complaint  Patient presents with  . Facial Droop  : HPI: Richard [name] is a 69 y.o. 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 By report patient lives with his wife independent prior to admission. Presented 12/10/2014 with aphasia and right sided weakness. No recent head trauma, no use of anticoagulation or antiplatelets. Systolic blood pressure greater than 200 in the ED. CT of the brain showed evidence of lobar left frontal ICH measuring 3.5 x 2.5 cm. Mild surrounding vasogenic edema but no hydrocephalus. MRI of the brain showed acute left posterior frontal intracerebral hemorrhage 30 x 24 x 32 mm favored to represent a lobar hypertensive bleed. No sign suggestive of vascular malformation or cortical venous thrombosis. Patient did require intubation for a short time due to acute respiratory failure and extubated 12/14/2014. Echocardiogram with ejection fraction 83% grade 1 diastolic dysfunction. Carotid Dopplers at no ICA stenosis. EEG with moderately severe generalized continuous nonspecific slowing consistent with encephalopathy. No evidence of seizure. Neurology consulted advise conservative care follow-up cranial CT scan stable. Tolerating a regular consistency diet. Close monitoring of blood pressure. Subcutaneous heparin initiated for DVT prophylaxis 12/12/2014. Physical and occupational therapy evaluations completed with recommendations of physical medicine rehabilitation consult. Patient was admitted for a comprehensive rehabilitation program  ROS Review of Systems  Unable to perform ROS: language    Past Medical History  Diagnosis Date  . Diabetes mellitus without complication   . Stroke    History reviewed. No pertinent past surgical history. History reviewed. No pertinent family history. Social History:  reports  that he has never smoked. He does not have any smokeless tobacco history on file. He reports that he drinks alcohol. His drug history is not on file. Allergies: No Known Allergies Medications Prior to Admission  Medication Sig Dispense Refill  . carvedilol (COREG) 25 MG tablet Take 25 mg by mouth 2 (two) times daily with a meal.    . insulin regular (NOVOLIN R,HUMULIN R) 100 units/mL injection Inject 100 Units into the skin 3 (three) times daily before meals.    Marland Kitchen lisinopril (PRINIVIL,ZESTRIL) 20 MG tablet Take 20 mg by mouth daily.    Marland Kitchen NOVOLIN N RELION 100 UNIT/ML injection Inject 30-40 Units into the skin daily before breakfast. Takes 40 units in the morning and 30-35 units at night    . olmesartan-hydrochlorothiazide (BENICAR HCT) 40-25 MG per tablet Take 1 tablet by mouth daily.      Home: Home Living Family/patient expects to be discharged to:: Unsure Living Arrangements: Spouse/significant other Additional Comments: Unable to get history from pt, no family present   Functional History: Prior Function Level of Independence:  (no family available to assess) Comments: no family present to ascertain PLOF  Functional Status:  Mobility: Bed Mobility Overal bed mobility: Needs Assistance, +2 for physical assistance Bed Mobility: Supine to Sit, Sit to Supine Supine to sit: Total assist, +2 for physical assistance Sit to supine: Min assist General bed mobility comments: cuess for direction Transfers Overall transfer level: Needs assistance Equipment used: Rolling walker (2 wheeled) Transfers: Sit to/from Stand Sit to Stand: Min assist, Mod assist General transfer comment: + 2 for safety, but pt not needing much help to rise Ambulation/Gait Ambulation/Gait assistance: Mod assist, Min assist, +2 safety/equipment Ambulation Distance (Feet): 300 Feet Assistive device: Rolling walker (2 wheeled), None Gait Pattern/deviations: Step-through pattern, Decreased dorsiflexion -  right, Wide  base of support General Gait Details: impaired safety with walker and right UE awarness limited; cues and manual assist to place on walker, one episode of right hand falling off walker and near loss of balance mod assist to recover,  assist on turns, around obstacels for safety with walker,  in room no device, min/mod assist for safety , cues for upright posture. Gait velocity: slow and deliberate Gait velocity interpretation: Below normal speed for age/gender    ADL: ADL Overall ADL's : Needs assistance/impaired General ADL Comments: total Assist  Cognition: Cognition Overall Cognitive Status: Impaired/Different from baseline Arousal/Alertness: Awake/alert Orientation Level: Oriented to person, Oriented to place, Oriented to situation, Oriented X4, Disoriented to time Attention: Sustained Focused Attention: Appears intact Sustained Attention: Appears intact Memory:  (TBD) Awareness: Impaired Awareness Impairment: Emergent impairment Problem Solving: Impaired Problem Solving Impairment: Functional complex Comments: difficult to fully assess due to aphasia Cognition Arousal/Alertness: Awake/alert Behavior During Therapy: Impulsive Overall Cognitive Status: Impaired/Different from baseline Area of Impairment: Safety/judgement, Awareness, Problem solving Safety/Judgement: Decreased awareness of deficits, Decreased awareness of safety Awareness: Intellectual Problem Solving: Difficulty sequencing, Requires verbal cues Difficult to assess due to: Impaired communication  Physical Exam: Blood pressure 161/86, pulse 95, temperature 97.8 F (36.6 C), temperature source Oral, resp. rate 18, height _0  (1.88 m), weight 102.059 kg (225 lb), SpO2 99 %. Physical Exam HENT:  Right facial weakness  Eyes:   artificial left eye, lid lag Neck: Normal range of motion. Neck supple. No thyromegaly present.  Cardiovascular: Normal rate and regular rhythm.  Respiratory: Effort normal and  breath sounds normal. No respiratory distress.  GI: Soft. Bowel sounds are normal. He exhibits no distension.  Neurological: Pt is alert sitting in bed. Right facial weakness. Oriented to self, place, could recall month given cues. Has delays in comprehension and expression. Able to follow simple commands with tactile and visual cueing. Can be perseverative.  RUE: 3+/5 deltoid, biceps, triceps, HI, RLE: 3+ hf, ke and 4/5 adf/apf. Decrease LT but senses pain on right.  Skin: without breakdown. Psych: pleasant and generally cooperative   Results for orders placed or performed during the hospital encounter of 12/10/14 (from the past 48 hour(s))  Glucose, capillary     Status: Abnormal   Collection Time: 12/14/14  8:13 AM  Result Value Ref Range   Glucose-Capillary 142 (H) 65 - 99 mg/dL   Comment 1 Notify RN    Comment 2 Document in Chart   Glucose, capillary     Status: Abnormal   Collection Time: 12/14/14 11:37 AM  Result Value Ref Range   Glucose-Capillary 129 (H) 65 - 99 mg/dL   Comment 1 Notify RN    Comment 2 Document in Chart   Glucose, capillary     Status: Abnormal   Collection Time: 12/14/14  3:31 PM  Result Value Ref Range   Glucose-Capillary 114 (H) 65 - 99 mg/dL   Comment 1 Notify RN   Basic metabolic panel     Status: Abnormal   Collection Time: 12/14/14  5:09 PM  Result Value Ref Range   Sodium 133 (L) 135 - 145 mmol/L   Potassium 3.7 3.5 - 5.1 mmol/L   Chloride 101 101 - 111 mmol/L   CO2 19 (L) 22 - 32 mmol/L   Glucose, Bld 127 (H) 65 - 99 mg/dL   BUN 15 6 - 20 mg/dL   Creatinine, Ser 1.10 0.61 - 1.24 mg/dL   Calcium 8.4 (L) 8.9 - 10.3 mg/dL  GFR calc non Af Amer >60 >60 mL/min   GFR calc Af Amer >60 >60 mL/min    Comment: (NOTE) The eGFR has been calculated using the CKD EPI equation. This calculation has not been validated in all clinical situations. eGFR's persistently <60 mL/min signify possible Chronic Kidney Disease.    Anion gap 13 5 - 15  Glucose,  capillary     Status: Abnormal   Collection Time: 12/14/14  7:47 PM  Result Value Ref Range   Glucose-Capillary 128 (H) 65 - 99 mg/dL  Glucose, capillary     Status: Abnormal   Collection Time: 12/15/14 12:17 AM  Result Value Ref Range   Glucose-Capillary 134 (H) 65 - 99 mg/dL  Basic metabolic panel     Status: Abnormal   Collection Time: 12/15/14  2:14 AM  Result Value Ref Range   Sodium 135 135 - 145 mmol/L   Potassium 4.2 3.5 - 5.1 mmol/L    Comment: SPECIMEN HEMOLYZED. HEMOLYSIS MAY AFFECT INTEGRITY OF RESULTS.   Chloride 104 101 - 111 mmol/L   CO2 17 (L) 22 - 32 mmol/L   Glucose, Bld 120 (H) 65 - 99 mg/dL   BUN 12 6 - 20 mg/dL   Creatinine, Ser 1.17 0.61 - 1.24 mg/dL   Calcium 8.8 (L) 8.9 - 10.3 mg/dL   GFR calc non Af Amer >60 >60 mL/min   GFR calc Af Amer >60 >60 mL/min    Comment: (NOTE) The eGFR has been calculated using the CKD EPI equation. This calculation has not been validated in all clinical situations. eGFR's persistently <60 mL/min signify possible Chronic Kidney Disease.    Anion gap 14 5 - 15  Glucose, capillary     Status: Abnormal   Collection Time: 12/15/14  3:46 AM  Result Value Ref Range   Glucose-Capillary 133 (H) 65 - 99 mg/dL  Glucose, capillary     Status: Abnormal   Collection Time: 12/15/14  7:37 AM  Result Value Ref Range   Glucose-Capillary 140 (H) 65 - 99 mg/dL  Glucose, capillary     Status: Abnormal   Collection Time: 12/15/14 11:20 AM  Result Value Ref Range   Glucose-Capillary 304 (H) 65 - 99 mg/dL  Glucose, capillary     Status: Abnormal   Collection Time: 12/15/14  3:52 PM  Result Value Ref Range   Glucose-Capillary 149 (H) 65 - 99 mg/dL  Glucose, capillary     Status: Abnormal   Collection Time: 12/15/14  7:57 PM  Result Value Ref Range   Glucose-Capillary 224 (H) 65 - 99 mg/dL   Comment 1 Notify RN    Comment 2 Document in Chart   Glucose, capillary     Status: Abnormal   Collection Time: 12/16/14 12:15 AM  Result Value Ref  Range   Glucose-Capillary 120 (H) 65 - 99 mg/dL   Comment 1 Notify RN    Comment 2 Document in Chart   Glucose, capillary     Status: Abnormal   Collection Time: 12/16/14  5:05 AM  Result Value Ref Range   Glucose-Capillary 159 (H) 65 - 99 mg/dL   Comment 1 Notify RN    Comment 2 Document in Chart    No results found.     Medical Problem List and Plan: 1. Functional deficits secondary to left posterior frontal intracranial hemorrhage felt to be secondary to hypertensive crisis 2.  DVT Prophylaxis/Anticoagulation: Subcutaneous heparin initiated 12/12/2014. Monitor platelet counts and any signs of bleeding 3. Pain Management: Tylenol  as needed 4. Acute respiratory failure. Extubated 12/14/2014. Comfortable/no distress currently 5. Neuropsych: This patient is capable of making decisions on his own behalf. 6. Skin/Wound Care: Routine skin checks 7. Fluids/Electrolytes/Nutrition: Routine I&O with follow-up chemistries in am 8. Hypertension. Norvasc 10 mg daily, Avapro 300 mg daily, hydrochlorothiazide 25 mg daily. Monitor with increased mobility---fair control at present 9. Diabetes mellitus of peripheral neuropathy. Hemoglobin A1c 7.5. Lantus insulin 10 units daily. Check blood sugars before meals and at bedtime.  -need to follow for consistent pattern before adjusting regimen 10. History of alcohol use. Provide counseling. Monitor for any signs of withdrawal 11. Hyperlipidemia. Lipitor  Post Admission Physician Evaluation: 1. Functional deficits secondary  to left posterior-frontal ICH. 2. Patient is admitted to receive collaborative, interdisciplinary care between the physiatrist, rehab nursing staff, and therapy team. 3. Patient's level of medical complexity and substantial therapy needs in context of that medical necessity cannot be provided at a lesser intensity of care such as a SNF. 4. Patient has experienced substantial functional loss from his/her baseline which was documented  above under the "Functional History" and "Functional Status" headings.  Judging by the patient's diagnosis, physical exam, and functional history, the patient has potential for functional progress which will result in measurable gains while on inpatient rehab.  These gains will be of substantial and practical use upon discharge  in facilitating mobility and self-care at the household level. 5. Physiatrist will provide 24 hour management of medical needs as well as oversight of the therapy plan/treatment and provide guidance as appropriate regarding the interaction of the two. 6. 24 hour rehab nursing will assist with bladder management, bowel management, safety, skin/wound care, disease management, medication administration, pain management and patient education  and help integrate therapy concepts, techniques,education, etc. 7. PT will assess and treat for/with: Lower extremity strength, range of motion, stamina, balance, functional mobility, safety, adaptive techniques and equipment, NMR, visual-perceptual and cognitive percceptual rx.   Goals are: supervision to mod I. 8. OT will assess and treat for/with: ADL's, functional mobility, safety, upper extremity strength, adaptive techniques and equipment, NMR, visual-perceptual rx.   Goals are: supervision. Therapy may proceed with showering this patient. 9. SLP will assess and treat for/with: cognition, memory , language.  Goals are: supervision to min assist. 10. Case Management and Social Worker will assess and treat for psychological issues and discharge planning. 11. Team conference will be held weekly to assess progress toward goals and to determine barriers to discharge. 12. Patient will receive at least 3 hours of therapy per day at least 5 days per week. 13. ELOS: 8-12 days       14. Prognosis:  excellent     Meredith Staggers, MD, [facility] 12/16/2014   12/16/2014

## 2014-12-16 NOTE — Progress Notes (Addendum)
Inpatient Diabetes Program Recommendations  AACE/ADA: New Consensus Statement on Inpatient Glycemic Control (2015)  Target Ranges:  Prepandial:   less than 140 mg/dL      Peak postprandial:   less than 180 mg/dL (1-2 hours)      Critically ill patients:  140 - 180 mg/dL   Review of Glycemic Control Inpatient Diabetes Program Recommendations:  May consider using the NPH bid as at home-would start with 15 units in the am and 15 units at HS and Novolog 5 units meal coverage tidwc  Or if want to continue lantus while here per below:   Insulin - Basal: Fasting this am was higher than HS cbg last night. Total home NPH at 60 units (35 am and 30 units HS). Please consider increase in lantus to closer to NPH total dose at home. Recommend increase of lantus to 20 units while here. May need increase until fasting tluocse is controlled Insulin - Meal Coverage: Please add 5 units meal coverage tidwc and continue correction as ordered.  HgbA1C: good control at home using NPH and R-A1C value at 7.5%   Ad. Noted that fasting glucose this am appears to be following po intake. Agree to continue lantus at 10 units and addition of meal coverage of 5 units. Will follow.  Thank you Lenor Coffin, RN, MSN, CDE  Diabetes Inpatient Program Office: [phone] Pager: [phone] 8:00 am to 5:00 pm

## 2014-12-16 NOTE — Interval H&P Note (Signed)
Richard [name] was admitted today to Inpatient Rehabilitation with the diagnosis of ICH.  The patient's history has been reviewed, patient examined, and there is no change in status.  Patient continues to be appropriate for intensive inpatient rehabilitation.  I have reviewed the patient's chart and labs.  Questions were answered to the patient's satisfaction. The PAPE has been reviewed and assessment remains appropriate.  SWARTZ,[name] 12/16/2014, 11:15 PM

## 2014-12-16 NOTE — Progress Notes (Signed)
[name], RN Rehab Admission Coordinator Signed Physical Medicine and Rehabilitation PMR Pre-admission 12/16/2014 11:27 AM  Related encounter: ED to Hosp-Admission (Current) from 12/10/2014 in [place] Collapse All   PMR Admission Coordinator Pre-Admission Assessment  Patient: Richard [name] is an 69 y.o., male MRN: [id number] DOB: [birth year] Height: $RemoveBeforeDE'6\' 2"'GUTliISrQCBjgcb$  (188 cm) Weight: 102.059 kg (225 lb)  Insurance Information HMO: PPO: Yes PCP: IPA: 80/20: OTHER:  PRIMARY: [employer] Medicare Policy#: [id number] Subscriber: [place] [employer] CM Name: Maylon Peppers Phone#: [phone] Fax#: [phone] Pre-Cert#: [id number] X 7days initially with update due on 12/22/14 Employer: Retired Benefits: Phone #: [phone] Name: Cherly Anderson. Date: 03/28/14 Deduct: $0 Out of Pocket Max: $4900 (met $45.28) Life Max: unlimited CIR: $285 days 1-6 SNF: $0 days 1-20; $160 days 21-100 Outpatient: medical necessity Co-Pay: $40 copay Home Health: 100% Co-Pay: none DME: 80% Co-Pay: 20% Providers: in network  Medicaid Application Date: Case Manager:  Disability Application Date: Case Worker:   Emergency Facilities manager Information    Name Relation Home Work [place] Spouse [phone]     Mays,Basil Son [phone]  [phone]     Current Medical History  Patient Admitting Diagnosis: L posterior frontal ICH  History of Present Illness: A 69 y.o. 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 By report patient lives with his wife independent prior to admission.  Presented 12/10/2014 with aphasia and right sided weakness. No recent head trauma, no use of anticoagulation or antiplatelets. Systolic blood pressure greater than 200 in the ED. CT of the brain showed evidence of lobar left frontal ICH measuring 3.5 x 2.5 cm. Mild surrounding vasogenic edema but no hydrocephalus. MRI of the brain showed acute left posterior frontal intracerebral hemorrhage 30 x 24 x 32 mm favored to represent a lobar hypertensive bleed. No sign suggestive of vascular malformation or cortical venous thrombosis. Patient did require intubation for a short time due to acute respiratory failure and extubated 12/14/2014. Echocardiogram with ejection fraction 24% grade 1 diastolic dysfunction. Carotid Dopplers at no ICA stenosis. EEG with moderately severe generalized continuous nonspecific slowing consistent with encephalopathy. No evidence of seizure. Neurology consulted advise conservative care follow-up cranial CT scan stable. Tolerating a regular consistency diet. Close monitoring of blood pressure. Subcutaneous heparin initiated for DVT prophylaxis 12/12/2014. Physical and occupational therapy evaluations completed with recommendations of physical medicine rehabilitation consult. Patient was admitted for a comprehensive rehabilitation program   Total: 10=NIH  Past Medical History  Past Medical History  Diagnosis Date  . Diabetes mellitus without complication   . Stroke     Family History  family history is not on file.  Prior Rehab/Hospitalizations: No previous rehab admissions.  Has the patient had major surgery during 100 days prior to admission? No  Current Medications   Current facility-administered medications:  . stroke: mapping our early stages of recovery book, , Does not apply, Once, Amie Portland, MD . acetaminophen (TYLENOL) tablet 650 mg, 650 mg, Oral, Q4H PRN, 650 mg at 12/15/14 2311 **OR** acetaminophen (TYLENOL) suppository 650 mg, 650 mg,  Rectal, Q4H PRN, Md Pccm, MD . amLODipine (NORVASC) tablet 10 mg, 10 mg, Oral, Daily, Rosalin Hawking, MD, 10 mg at 12/16/14 0925 . atorvastatin (LIPITOR) tablet 10 mg, 10 mg, Oral, q1800, Anders Simmonds, MD . feeding supplement (ENSURE ENLIVE) (ENSURE ENLIVE) liquid 237 mL, 237 mL, Oral, BID BM, Heather  Webb Silversmith, RD, 237 mL at 12/16/14 0958 . folic acid (FOLVITE) tablet 1 mg, 1 mg, Oral, Daily, Anders Simmonds, MD, 1 mg at 12/16/14 0925 . heparin injection 5,000 Units, 5,000 Units, Subcutaneous, 3 times per day, Rosalin Hawking, MD, 5,000 Units at 12/16/14 0513 . irbesartan (AVAPRO) tablet 300 mg, 300 mg, Oral, Daily, 300 mg at 12/16/14 0925 **AND** hydrochlorothiazide (HYDRODIURIL) tablet 25 mg, 25 mg, Oral, Daily, Garvin Fila, MD, 25 mg at 12/16/14 0925 . insulin aspart (novoLOG) injection 0-5 Units, 0-5 Units, Subcutaneous, QHS, Javier Glazier, MD . insulin aspart (novoLOG) injection 0-9 Units, 0-9 Units, Subcutaneous, TID WC, Javier Glazier, MD . insulin aspart (novoLOG) injection 5 Units, 5 Units, Subcutaneous, TID WC, Javier Glazier, MD . insulin glargine (LANTUS) injection 10 Units, 10 Units, Subcutaneous, QHS, Javier Glazier, MD . multivitamin with minerals tablet 1 tablet, 1 tablet, Oral, Daily, Anders Simmonds, MD, 1 tablet at 12/16/14 [phone] . senna-docusate (Senokot-S) tablet 1 tablet, 1 tablet, Oral, BID, Anders Simmonds, MD, 1 tablet at 12/16/14 [phone] . thiamine (VITAMIN B-1) tablet 100 mg, 100 mg, Oral, Daily, 100 mg at 12/16/14 0925 **OR** [DISCONTINUED] thiamine (B-1) injection 100 mg, 100 mg, Intravenous, Daily, Anders Simmonds, MD . vitamin B-12 (CYANOCOBALAMIN) tablet 100 mcg, 100 mcg, Oral, Daily, Anders Simmonds, MD, 100 mcg at 12/16/14 8101  Patients Current Diet: DIET DYS 3 Room service appropriate?: Yes; Fluid consistency:: Thin  Precautions / Restrictions Precautions Precautions: Fall Restrictions Weight Bearing Restrictions: No   Has the patient had  2 or more falls or a fall with injury in the past year?No  Prior Activity Level Community (5-7x/wk): Went out daily for at least 30 minutes each day. Was driving per wife.  Home Assistive Devices / Equipment Home Assistive Devices/Equipment: None  Prior Device Use: Indicate devices/aids used by the patient prior to current illness, exacerbation or injury? None  Prior Functional Level Prior Function Level of Independence: (no family available to assess) Comments: no family present to ascertain PLOF  Self Care: Did the patient need help bathing, dressing, using the toilet or eating? Independent  Indoor Mobility: Did the patient need assistance with walking from room to room (with or without device)? Independent  Stairs: Did the patient need assistance with internal or external stairs (with or without device)? Independent  Functional Cognition: Did the patient need help planning regular tasks such as shopping or remembering to take medications? Independent  Current Functional Level Cognition  Arousal/Alertness: Awake/alert Overall Cognitive Status: Impaired/Different from baseline Difficult to assess due to: Impaired communication Orientation Level: Oriented to person, Oriented to place, Oriented to situation, Oriented X4, Oriented to time Safety/Judgement: Decreased awareness of deficits, Decreased awareness of safety Attention: Sustained Focused Attention: Appears intact Sustained Attention: Appears intact Memory: (TBD) Awareness: Impaired Awareness Impairment: Emergent impairment Problem Solving: Impaired Problem Solving Impairment: Functional complex Comments: difficult to fully assess due to aphasia   Extremity Assessment (includes Sensation/Coordination)  Upper Extremity Assessment: RUE deficits/detail, LUE deficits/detail RUE Deficits / Details: moving spontaneously but not functionally following commands. will further assess RUE Coordination: decreased fine  motor, decreased gross motor LUE Deficits / Details: moving spontaneously but not functionally; not following commands LUE Coordination: decreased fine motor, decreased gross motor  Lower Extremity Assessment: Defer to PT evaluation RLE Deficits / Details: moving against gravity, but not coordinated. Unable to MMT due to unable to follow direction/ Gross extension 4+/5 RLE Coordination: decreased fine motor LLE Deficits / Details: appears WFL without  formal MMT    ADLs  Overall ADL's : Needs assistance/impaired General ADL Comments: total Assist    Mobility  Overal bed mobility: Needs Assistance, +2 for physical assistance Bed Mobility: Supine to Sit, Sit to Supine Supine to sit: Total assist, +2 for physical assistance Sit to supine: Min assist General bed mobility comments: cuess for direction    Transfers  Overall transfer level: Needs assistance Equipment used: Rolling walker (2 wheeled) Transfers: Sit to/from Stand Sit to Stand: Min assist, Mod assist General transfer comment: + 2 for safety, but pt not needing much help to rise    Ambulation / Gait / Stairs / Wheelchair Mobility  Ambulation/Gait Ambulation/Gait assistance: Mod assist, Min assist, +2 safety/equipment Ambulation Distance (Feet): 300 Feet Assistive device: Rolling walker (2 wheeled), None Gait Pattern/deviations: Step-through pattern, Decreased dorsiflexion - right, Wide base of support General Gait Details: impaired safety with walker and right UE awarness limited; cues and manual assist to place on walker, one episode of right hand falling off walker and near loss of balance mod assist to recover, assist on turns, around obstacels for safety with walker, in room no device, min/mod assist for safety , cues for upright posture. Gait velocity: slow and deliberate Gait velocity interpretation: Below normal speed for age/gender    Posture / Balance Dynamic Sitting Balance Sitting balance -  Comments: pt resistant to effort to get him to bring his trunk forward off the bed and sitting up on side of bed. 2 person assist to get pt fully upright for 3-5 min and abruptly pt went from significant effort to "asleep" with snoring in less than 10 secs. BP showed that pt had passed out with BP of 54/38. Pt returned to supine Balance Overall balance assessment: Needs assistance Sitting-balance support: Bilateral upper extremity supported, Single extremity supported Sitting balance-Leahy Scale: Fair Sitting balance - Comments: pt resistant to effort to get him to bring his trunk forward off the bed and sitting up on side of bed. 2 person assist to get pt fully upright for 3-5 min and abruptly pt went from significant effort to "asleep" with snoring in less than 10 secs. BP showed that pt had passed out with BP of 54/38. Pt returned to supine Standing balance-Leahy Scale: Poor    Special needs/care consideration BiPAP/CPAP No CPM No Continuous Drip IV No Dialysis No  Life Vest No Oxygen No Special Bed No Trach Size No Wound Vac (area) No  Skin No  Bowel mgmt: Last BM 12/10/14 with incontinence Bladder mgmt: Urinary catheter in place for urinary retention Diabetic mgmt Yes, on insulin at home    Previous Home Environment Living Arrangements: Spouse/significant other Home Care Services: No Additional Comments: Unable to get history from pt, no family present  Discharge Living Setting Plans for Discharge Living Setting: Patient's home, House, Lives with (comment) (Lives with wife, sister-in-law and 3 yo son.) Type of Home at Discharge: House Discharge Home Layout: Two level, Able to live on main level with bedroom/bathroom Alternate Level Stairs-Number of Steps: 13 Discharge Home Access: Stairs to enter Entrance Stairs-Number of Steps: 2 Does the patient have any problems obtaining your medications?: No  Social/Family/Support  Systems Patient Roles: Spouse, Parent (Has a wife, daughter and a son.) Contact Information: Lamoine Magallon - wife [phone] Anticipated Caregiver: wife, son Anticipated Caregiver's Contact Information: Archie Patten - son [phone] Ability/Limitations of Caregiver: Wife is homebound and can provide supervision. Son works and stays with patient at night. Caregiver Availability: 24/7 Discharge Plan Discussed  with Primary Caregiver: Yes Is Caregiver In Agreement with Plan?: Yes Does Caregiver/Family have Issues with Lodging/Transportation while Pt is in Rehab?: No  Goals/Additional Needs Patient/Family Goal for Rehab: PT/OT/ST supervision goals Expected length of stay: 18-22 days Cultural Considerations: None Dietary Needs: Dys 3 carb modified heart healthy thin liquids Equipment Needs: TBD Pt/Family Agrees to Admission and willing to participate: Yes Program Orientation Provided & Reviewed with Pt/Caregiver Including Roles & Responsibilities: Yes  Decrease burden of Care through IP rehab admission: N/A  Possible need for SNF placement upon discharge: Not anticipated  Patient Condition: This patient's medical and functional status has changed since the consult dated: 12/12/14 in which the Rehabilitation Physician determined and documented that the patient's condition is appropriate for intensive rehabilitative care in an inpatient rehabilitation facility. See "History of Present Illness" (above) for medical update. Functional changes are: Currently requiring min/mod assist for transfers and min/mod assist to ambulate 300 ft RW Patient's medical and functional status update has been discussed with the Rehabilitation physician and patient remains appropriate for inpatient rehabilitation. Will admit to inpatient rehab today.  Preadmission Screen Completed By: [name], 12/16/2014 11:47 AM ______________________________________________________________________  Discussed status with  Dr. Naaman Plummer on 12/16/14 at 1146 and received telephone approval for admission today.  Admission Coordinator: [name], time1146/Date09/20/16          Cosigned by: Meredith Staggers, MD at 12/16/2014 11:59 AM  Revision History     Date/Time User Provider Type Action   12/16/2014 11:59 AM Meredith Staggers, MD Physician Cosign   12/16/2014 11:48 AM [name], RN Rehab Admission Coordinator Sign   12/16/2014 11:47 AM [name], RN Rehab Admission Coordinator Sign   View Details Report

## 2014-12-17 ENCOUNTER — Inpatient Hospital Stay (HOSPITAL_COMMUNITY): Payer: Medicare HMO | Admitting: Speech Pathology

## 2014-12-17 ENCOUNTER — Inpatient Hospital Stay (HOSPITAL_COMMUNITY): Payer: Medicare HMO | Admitting: Physical Therapy

## 2014-12-17 ENCOUNTER — Inpatient Hospital Stay (HOSPITAL_COMMUNITY): Payer: Medicare HMO | Admitting: Occupational Therapy

## 2014-12-17 ENCOUNTER — Encounter (HOSPITAL_COMMUNITY): Payer: Self-pay | Admitting: *Deleted

## 2014-12-17 DIAGNOSIS — I69393 Ataxia following cerebral infarction: Secondary | ICD-10-CM

## 2014-12-17 LAB — CBC WITH DIFFERENTIAL/PLATELET
BASOS ABS: 0 10*3/uL (ref 0.0–0.1)
BASOS PCT: 1 %
EOS ABS: 0.2 10*3/uL (ref 0.0–0.7)
Eosinophils Relative: 2 %
HEMATOCRIT: 36.1 % — AB (ref 39.0–52.0)
HEMOGLOBIN: 12.6 g/dL — AB (ref 13.0–17.0)
Lymphocytes Relative: 27 %
Lymphs Abs: 2.2 10*3/uL (ref 0.7–4.0)
MCH: 30.7 pg (ref 26.0–34.0)
MCHC: 34.9 g/dL (ref 30.0–36.0)
MCV: 87.8 fL (ref 78.0–100.0)
MONOS PCT: 17 %
Monocytes Absolute: 1.5 10*3/uL — ABNORMAL HIGH (ref 0.1–1.0)
NEUTROS ABS: 4.5 10*3/uL (ref 1.7–7.7)
NEUTROS PCT: 53 %
Platelets: 254 10*3/uL (ref 150–400)
RBC: 4.11 MIL/uL — ABNORMAL LOW (ref 4.22–5.81)
RDW: 11.3 % — AB (ref 11.5–15.5)
WBC: 8.4 10*3/uL (ref 4.0–10.5)

## 2014-12-17 LAB — COMPREHENSIVE METABOLIC PANEL
ALBUMIN: 3 g/dL — AB (ref 3.5–5.0)
ALT: 37 U/L (ref 17–63)
ANION GAP: 9 (ref 5–15)
AST: 33 U/L (ref 15–41)
Alkaline Phosphatase: 62 U/L (ref 38–126)
BILIRUBIN TOTAL: 1.1 mg/dL (ref 0.3–1.2)
BUN: 12 mg/dL (ref 6–20)
CO2: 28 mmol/L (ref 22–32)
Calcium: 9.1 mg/dL (ref 8.9–10.3)
Chloride: 99 mmol/L — ABNORMAL LOW (ref 101–111)
Creatinine, Ser: 1.02 mg/dL (ref 0.61–1.24)
GFR calc Af Amer: >60 mL/min (ref >60)
GFR calc non Af Amer: >60 mL/min (ref >60)
GLUCOSE: 173 mg/dL — AB (ref 65–99)
POTASSIUM: 3.1 mmol/L — AB (ref 3.5–5.1)
SODIUM: 136 mmol/L (ref 135–145)
Total Protein: 6.5 g/dL (ref 6.5–8.1)

## 2014-12-17 LAB — URINALYSIS, ROUTINE W REFLEX MICROSCOPIC
Bilirubin Urine: NEGATIVE
Glucose, UA: >1000 mg/dL — AB
KETONES UR: NEGATIVE mg/dL
LEUKOCYTES UA: NEGATIVE
NITRITE: NEGATIVE
PROTEIN: NEGATIVE mg/dL
Specific Gravity, Urine: 1.02 (ref 1.005–1.030)
Urobilinogen, UA: 1 mg/dL (ref 0.0–1.0)
pH: 5 (ref 5.0–8.0)

## 2014-12-17 LAB — GLUCOSE, CAPILLARY
GLUCOSE-CAPILLARY: 249 mg/dL — AB (ref 65–99)
Glucose-Capillary: 173 mg/dL — ABNORMAL HIGH (ref 65–99)
Glucose-Capillary: 234 mg/dL — ABNORMAL HIGH (ref 65–99)
Glucose-Capillary: 202 mg/dL — ABNORMAL HIGH (ref 65–99)

## 2014-12-17 LAB — URINE MICROSCOPIC-ADD ON

## 2014-12-17 MED ORDER — GLUCERNA SHAKE PO LIQD
237.0000 mL | Freq: Two times a day (BID) | ORAL | Status: DC
  Administered 2014-12-17 (×2): 237 mL via ORAL

## 2014-12-17 MED ORDER — INSULIN ASPART 100 UNIT/ML ~~LOC~~ SOLN
0.0000 [IU] | Freq: Every day | SUBCUTANEOUS | Status: DC
  Administered 2014-12-17 – 2014-12-19 (×3): 2 [IU] via SUBCUTANEOUS
  Administered 2014-12-22: 3 [IU] via SUBCUTANEOUS
  Administered 2014-12-23 – 2014-12-24 (×2): 2 [IU] via SUBCUTANEOUS

## 2014-12-17 MED ORDER — GLUCERNA SHAKE PO LIQD
237.0000 mL | Freq: Two times a day (BID) | ORAL | Status: DC
  Administered 2014-12-18 – 2014-12-19 (×2): 237 mL via ORAL

## 2014-12-17 MED ORDER — INSULIN ASPART 100 UNIT/ML ~~LOC~~ SOLN
0.0000 [IU] | Freq: Three times a day (TID) | SUBCUTANEOUS | Status: DC
Start: 2014-12-17 — End: 2014-12-26
  Administered 2014-12-17 – 2014-12-18 (×4): 3 [IU] via SUBCUTANEOUS
  Administered 2014-12-19 (×2): 2 [IU] via SUBCUTANEOUS
  Administered 2014-12-19 – 2014-12-20 (×2): 3 [IU] via SUBCUTANEOUS
  Administered 2014-12-20: 2 [IU] via SUBCUTANEOUS
  Administered 2014-12-20: 3 [IU] via SUBCUTANEOUS
  Administered 2014-12-21 (×2): 2 [IU] via SUBCUTANEOUS
  Administered 2014-12-21: 3 [IU] via SUBCUTANEOUS
  Administered 2014-12-22: 5 [IU] via SUBCUTANEOUS
  Administered 2014-12-22 – 2014-12-23 (×3): 2 [IU] via SUBCUTANEOUS
  Administered 2014-12-23: 5 [IU] via SUBCUTANEOUS
  Administered 2014-12-23: 2 [IU] via SUBCUTANEOUS
  Administered 2014-12-24 (×2): 1 [IU] via SUBCUTANEOUS
  Administered 2014-12-24: 7 [IU] via SUBCUTANEOUS
  Administered 2014-12-25: 2 [IU] via SUBCUTANEOUS
  Administered 2014-12-25: 3 [IU] via SUBCUTANEOUS
  Administered 2014-12-25: 2 [IU] via SUBCUTANEOUS

## 2014-12-17 MED ORDER — SENNOSIDES-DOCUSATE SODIUM 8.6-50 MG PO TABS
2.0000 | ORAL_TABLET | Freq: Two times a day (BID) | ORAL | Status: DC
  Administered 2014-12-17 – 2014-12-26 (×17): 2 via ORAL
  Filled 2014-12-17 (×21): qty 2

## 2014-12-17 MED ORDER — POTASSIUM CHLORIDE CRYS ER 20 MEQ PO TBCR
20.0000 meq | EXTENDED_RELEASE_TABLET | Freq: Two times a day (BID) | ORAL | Status: AC
  Administered 2014-12-17 – 2014-12-18 (×4): 20 meq via ORAL
  Filled 2014-12-17 (×4): qty 1

## 2014-12-17 MED ORDER — GLUCERNA SHAKE PO LIQD: 237.0000 mL | Freq: Two times a day (BID) | ORAL | Status: DC

## 2014-12-17 NOTE — Progress Notes (Signed)
Social Work Assessment and Plan Social Work Assessment and Plan  Patient Details  Name: Richard [name] MRN: [id number] Date of Birth: 1945/03/29  Today's Date: 12/17/2014  Problem List:  Patient Active Problem List   Diagnosis Date Noted  . 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 Type 2 diabetes mellitus with peripheral neuropathy   . Essential hypertension   . Alcohol withdrawal   . Cerebral hemorrhage   . Endotracheally intubated   . ICH (intracerebral hemorrhage) 12/10/2014   Past Medical History:  Past Medical History  Diagnosis Date  . Diabetes mellitus without complication   . Stroke    Past Surgical History: History reviewed. No pertinent past surgical history. Social History:  reports that he has never smoked. He does not have any smokeless tobacco history on file. He reports that he drinks alcohol. His drug history is not on file.  Family / Support Systems Marital Status: Married Patient Roles: Spouse, Parent Spouse/Significant Other: Bonita Quin  [phone] Children: Basil Mays-son  [phone] Other Supports: Sister in-law lives in the home also Anticipated Caregiver: Wife, sister in-law and son Ability/Limitations of Caregiver: Wife has health issues and can provide supervision level only, son works but sister in-law is there also to assist while son working Caregiver Availability: 24/7 Family Dynamics: Close knit family whom do for one another. Pt reports he is not used to having others do for him, but he would do the same for them. He is encouraged by his progress and daughter whom is here from [place] is pleased with his progress.  Social History Preferred language: English Religion:  Cultural Background: No issues Education: High School Read: Yes Write: Yes Employment Status: Retired Fish farm manager Issues: No issues Guardian/Conservator: None-according to MD pt is capable of mkaing his own decisions while here   Abuse/Neglect Physical Abuse:  Denies Verbal Abuse: Denies Sexual Abuse: Denies Exploitation of patient/patient's resources: Denies Self-Neglect: Denies  Emotional Status Pt's affect, behavior adn adjustment status: Pt is motivated to improve and return to his independent level.  He is hopeful and will work hard he has done this all of his life and will continue to until he takes his last breath. Daughter is here form [place] and reports this is him and he will do whatever it takes for him to recover from this. Recent Psychosocial Issues: other healht issues thought they were managed up until this. Pyschiatric History: No history deferred depression screen due to pt doing well and is able to express himself and relies upon his faith.  He may benefit from neuro-psych to see during his stay due to ETOH issues. Substance Abuse History: ETOH-plans to limit this or quit all together now.  He is aware of the resources available to him and will let worker know if needed.  Patient / Family Perceptions, Expectations & Goals Pt/Family understanding of illness & functional limitations: Pt and daughter can explain his stroke and deficits.  Both have spoken with the MD and feel their questions and concerns are being addressed.  Pt is pleased with the progress and is hoping he will make more while here. Premorbid pt/family roles/activities: Husband, father, retiree, church member, Chief Financial Officer, etc Anticipated changes in roles/activities/participation: resume Pt/family expectations/goals: Pt states: " I want to be able to do for myself, don;t want to burden others in my family."  Daughter states: " I hope he keeps making the progress he is making and he will do well."  [employer]: None Premorbid Home Care/DME Agencies: None Transportation available  at discharge: Family Resource referrals recommended: Neuropsychology, Support group (specify)  Discharge Planning Living Arrangements: Spouse/significant other, Other  relatives, Children Support Systems: Spouse/significant other, Children, Other relatives, Friends/neighbors, Church/faith community Type of Residence: Private residence Insurance Resources: Media planner (specify) Radiographer, therapeutic) Financial Resources: Restaurant manager, fast food Screen Referred: No Living Expenses: Lives with family Money Management: Spouse, Patient Does the patient have any problems obtaining your medications?: No Home Management: Wife and sister in-law Patient/Family Preliminary Plans: Return home with family who all will assist him in his care.  Wife is able to provide superivison level and sister in-law is there during the day to assist while son works.  Son can assist him at night. Pt should do well he already is making good progress since stroke. Social Work Anticipated Follow Up Needs: HH/OP, Support Group  Clinical Impression Pleasant gentleman who is motivated to improve and recover from this stroke to become independent again. His family is very supportive and will assist him at discharge if needed.  Pt will be a short length of stay due to his progress and high level.  Will re-address ETOH and resources if needed. Work on discharge plans.  Lucy Chris 12/17/2014, 12:45 PM

## 2014-12-17 NOTE — Care Management Note (Signed)
Inpatient Rehabilitation Center Individual Statement of Services  Patient Name:  Richard [name]  Date:  12/17/2014  Welcome to the Inpatient Rehabilitation Center.  Our goal is to provide you with an individualized program based on your diagnosis and situation, designed to meet your specific needs.  With this comprehensive rehabilitation program, you will be expected to participate in at least 3 hours of rehabilitation therapies Monday-Friday, with modified therapy programming on the weekends.  Your rehabilitation program will include the following services:  Physical Therapy (PT), Occupational Therapy (OT), Speech Therapy (ST), 24 hour per day rehabilitation nursing, Case Management (Social Worker), Rehabilitation Medicine, Nutrition Services and Pharmacy Services  Weekly team conferences will be held on Wednesday to discuss your progress.  Your Social Worker will talk with you frequently to get your input and to update you on team discussions.  Team conferences with you and your family in attendance may also be held.  Expected length of stay: 10-12 days  Overall anticipated outcome: supervision/mod/i level  Depending on your progress and recovery, your program may change. Your Social Worker will coordinate services and will keep you informed of any changes. Your Social Worker's name and contact numbers are listed  below.  The following services may also be recommended but are not provided by the Inpatient Rehabilitation Center:   Driving Evaluations  Home Health Rehabiltiation Services  Outpatient Rehabilitation Services   Arrangements will be made to provide these services after discharge if needed.  Arrangements include referral to agencies that provide these services.  Your insurance has been verified to be:  [employer] Your primary doctor is:  Alysia Penna  Pertinent information will be shared with your doctor and your insurance company.  Social Worker:  Dossie Der, SW  [phone] or ([phone]  Information discussed with and copy given to patient by: Lucy Chris, 12/17/2014, 9:43 AM

## 2014-12-17 NOTE — Progress Notes (Signed)
Patient information reviewed and entered into eRehab system by Marie Noel, RN, CRRN, PPS Coordinator.  Information including medical coding and functional independence measure will be reviewed and updated through discharge.     Per nursing patient was given "Data Collection Information Summary for Patients in Inpatient Rehabilitation Facilities with attached "Privacy Act Statement-Health Care Records" upon admission.  

## 2014-12-17 NOTE — Evaluation (Signed)
Occupational Therapy Assessment and Plan  Patient Details  Name: Richard [name] MRN: [id number] Date of Birth: [birth year]  OT Diagnosis: ataxia, blindness and low vision, cognitive deficits, hemiplegia affecting dominant side and muscle weakness (generalized) Rehab Potential: Rehab Potential (ACUTE ONLY): Good ELOS: 10-14 days   Today's Date: 12/17/2014 OT Individual Time: 0930-1100 OT Individual Time Calculation (min): 90 min     Problem List:  Patient Active Problem List   Diagnosis Date Noted  . 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 Type 2 diabetes mellitus with peripheral neuropathy   . Essential hypertension   . Alcohol withdrawal   . Cerebral hemorrhage   . Endotracheally intubated   . ICH (intracerebral hemorrhage) 12/10/2014    Past Medical History:  Past Medical History  Diagnosis Date  . Diabetes mellitus without complication   . Stroke    Past Surgical History: History reviewed. No pertinent past surgical history.  Assessment & Plan Clinical Impression: Patient is a 69 y.o. 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 By report patient lives with his wife independent prior to admission. Presented 12/10/2014 with aphasia and right sided weakness. No recent head trauma, no use of anticoagulation or antiplatelets. Systolic blood pressure greater than 200 in the ED. CT of the brain showed evidence of lobar left frontal ICH measuring 3.5 x 2.5 cm. Mild surrounding vasogenic edema but no hydrocephalus. MRI of the brain showed acute left posterior frontal intracerebral hemorrhage 30 x 24 x 32 mm favored to represent a lobar hypertensive bleed. No sign suggestive of vascular malformation or cortical venous thrombosis. Patient did require intubation for a short time due to acute respiratory failure and extubated 12/14/2014. Echocardiogram with ejection fraction 35% grade 1 diastolic  dysfunction. Carotid Dopplers at no ICA stenosis. EEG with moderately severe generalized continuous nonspecific slowing consistent with encephalopathy. No evidence of seizure. Neurology consulted advise conservative care follow-up cranial CT scan stable. Tolerating a regular consistency diet. Close monitoring of blood pressure. Subcutaneous heparin initiated for DVT prophylaxis 12/12/2014. Physical and occupational therapy evaluations completed with recommendations of physical medicine rehabilitation consult.   Patient transferred to CIR on 12/16/2014 .    Patient currently requires mod with basic self-care skills secondary to muscle weakness, impaired timing and sequencing, abnormal tone, unbalanced muscle activation, ataxia and decreased coordination, decreased attention to right, decreased awareness, decreased problem solving, decreased safety awareness and decreased memory and decreased standing balance and decreased postural control.  Prior to hospitalization, patient could complete ADLs with independent .  Patient will benefit from skilled intervention to increase independence with basic self-care skills prior to discharge home with care partner.  Anticipate patient will require 24 hour supervision and follow up home health and follow up outpatient.  OT - End of Session Activity Tolerance: Tolerates 30+ min activity without fatigue Endurance Deficit: No OT Assessment Rehab Potential (ACUTE ONLY): Good OT Patient demonstrates impairments in the following area(s): Balance;Cognition;Endurance;Motor;Perception;Safety;Skin Integrity OT Basic ADL's Functional Problem(s): Grooming;Bathing;Dressing;Toileting OT Advanced ADL's Functional Problem(s): Simple Meal Preparation OT Transfers Functional Problem(s): Toilet;Tub/Shower OT Additional Impairment(s): Fuctional Use of Upper Extremity OT Plan OT Intensity: Minimum of 1-2 x/day, 45 to 90 minutes OT Frequency: 5 out of 7 days OT Duration/Estimated  Length of Stay: 10-14 days OT Treatment/Interventions: Balance/vestibular training;Cognitive remediation/compensation;Community reintegration;Discharge planning;Disease mangement/prevention;DME/adaptive equipment instruction;Functional mobility training;Neuromuscular re-education;Patient/family education;Psychosocial support;Self Care/advanced ADL retraining;Therapeutic Activities;Therapeutic Exercise;UE/LE Strength taining/ROM;UE/LE Coordination activities;Visual/perceptual remediation/compensation OT Self Feeding Anticipated Outcome(s): No goal OT Basic  Self-Care Anticipated Outcome(s): Mod I OT Toileting Anticipated Outcome(s): Mod I OT Bathroom Transfers Anticipated Outcome(s): Mod I OT Recommendation Patient destination: Home Follow Up Recommendations: Home health OT;Outpatient OT;24 hour supervision/assistance (TBD) Equipment Recommended: Tub/shower bench;To be determined   Skilled Therapeutic Intervention OT eval completed with discussion of rehab process, OT purpose, POC, and ELOS and goals.  ADL assessment completed at sit > stand level in room shower with constant steady assist secondary to quick movements and impulsivity.  Noted decreased sensation and proprioception in dominant RUE during bathing and dressing tasks with pt dropping items without awareness.  Discussed functional implications and safety issues with impaired sensation and proprioception.  Pt required assist with donning underwear and pants secondary to catheter and was unable to fasten shoes due to ataxia and decreased grading of movements.  In ADL tubroom, educated on tub/shower and toilet transfers with recommendation for shower seat (type TBD) and grab bars in shower.  Ambulated approx 75 feet with min assist and use of rail in hallway, noted intermittent "staggering" to Rt during ambulation.  OT Evaluation Precautions/Restrictions  Precautions Precautions: Fall Restrictions Weight Bearing Restrictions: No General    Vital Signs Therapy Vitals BP: 129/80 mmHg Pain Pain Assessment Pain Assessment: No/denies pain Pain Score: 0-No pain Home Living/Prior Functioning Home Living Family/patient expects to be discharged to:: Unsure Living Arrangements: Spouse/significant other Available Help at Discharge: Family, Available 24 hours/day Type of Home: House Home Access: Stairs to enter [employer] of Steps: 3 small steps Entrance Stairs-Rails: None Home Layout: Two level, Able to live on main level with bedroom/bathroom Alternate Level Stairs-Number of Steps: full flight Bathroom Shower/Tub: Tub/shower unit, Industrial/product designer: Yes  Lives With: Spouse, Family IADL History Homemaking Responsibilities: Yes Meal Prep Responsibility: Secondary Laundry Responsibility: Primary Cleaning Responsibility: Secondary Bill Paying/Finance Responsibility: Primary Shopping Responsibility: Primary Current License: Yes Mode of Transportation: Car Occupation: Retired Prior Function Level of Independence: Independent with basic ADLs, Independent with homemaking with ambulation, Independent with gait, Independent with transfers  Able to Take Stairs?: Yes Driving: Yes Vocation: Retired Leisure: Hobbies-yes (Comment) (golf, reading) ADL  See Function Navigator Vision/Perception  Vision- History Baseline Vision/History: Wears glasses (Lt eye prosthesis ) Wears Glasses: At all times Vision- Assessment Vision Assessment?: Vision impaired- to be further tested in functional context  Cognition Overall Cognitive Status: Impaired/Different from baseline Arousal/Alertness: Awake/alert Orientation Level: Person;Place;Situation Person: Oriented Place: Oriented Situation: Oriented Year: Other (Comment) (2014) Month: September Day of Week: Correct Memory: Impaired (not formally assessed but pt reports impairments) Memory Impairment: Decreased recall of new  information Memory Recall: Sock;Blue;Bed Memory Recall Sock: With Cue Memory Recall Blue: Without Cue Memory Recall Bed: With Cue Attention: Sustained Sustained Attention: Appears intact Awareness: Impaired Awareness Impairment: Emergent impairment Problem Solving: Impaired Problem Solving Impairment: Functional complex;Verbal complex Executive Function: Self Monitoring;Self Correcting Self Monitoring: Impaired Self Monitoring Impairment: Verbal complex Self Correcting: Impaired Self Correcting Impairment: Verbal complex Behaviors: Impulsive Safety/Judgment: Impaired Sensation Sensation Light Touch: Impaired Detail Light Touch Impaired Details: Absent RUE;Impaired RLE Stereognosis: Impaired Detail Stereognosis Impaired Details: Absent RUE Hot/Cold: Impaired by gross assessment Proprioception: Impaired Detail Proprioception Impaired Details: Absent RUE;Impaired RLE Coordination Gross Motor Movements are Fluid and Coordinated: No Fine Motor Movements are Fluid and Coordinated: No Coordination and Movement Description: Ataxic movements with RUE, exacerbated by impaired proprioception and sensation Finger Nose Finger Test: Impaired on RUE due to impaired proprioception and grading of movement Motor    Mobility  Bed Mobility Bed Mobility: Rolling Right;Right Sidelying  to Sit Rolling Right: 4: Min assist Rolling Right Details: Verbal cues for precautions/safety;Verbal cues for technique;Tactile cues for weight shifting Right Sidelying to Sit: 4: Min guard Transfers Transfers: Sit to Stand;Stand to Sit Sit to Stand: 4: Min assist Sit to Stand Details: Tactile cues for posture;Verbal cues for precautions/safety Stand to Sit: 4: Min assist Stand to Sit Details (indicate cue type and reason): Tactile cues for posture;Verbal cues for precautions/safety  Extremity/Trunk Assessment RUE Assessment RUE Assessment: Exceptions to [facility] (strength and ROM WFL, ataxic movements and  decreased grading) LUE Assessment LUE Assessment: Within Functional Limits   See Function Navigator for Current Functional Status.   Refer to Care Plan for Long Term Goals  Recommendations for other services: None  Discharge Criteria: Patient will be discharged from OT if patient refuses treatment 3 consecutive times without medical reason, if treatment goals not met, if there is a change in medical status, if patient makes no progress towards goals or if patient is discharged from hospital.  The above assessment, treatment plan, treatment alternatives and goals were discussed and mutually agreed upon: by patient and by family  Richard [name], [facility] 12/17/2014, 11:45 AM

## 2014-12-17 NOTE — Evaluation (Signed)
Physical Therapy Assessment and Plan  Patient Details  Name: Richard [name] MRN: [id number] Date of Birth: 1945-06-19  PT Diagnosis: Abnormality of gait, Ataxic gait, Difficulty walking, Hemiparesis dominant, Impaired cognition, Impaired sensation and Muscle weakness Rehab Potential: Excellent ELOS: 8-10 days   Today's Date: 12/17/2014 PT Individual Time: 1300-1355 PT Individual Time Calculation (min): 55 min    Problem List:  Patient Active Problem List   Diagnosis Date Noted  . 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 Type 2 diabetes mellitus with peripheral neuropathy   . Essential hypertension   . Alcohol withdrawal   . Cerebral hemorrhage   . Endotracheally intubated   . ICH (intracerebral hemorrhage) 12/10/2014    Past Medical History:  Past Medical History  Diagnosis Date  . Diabetes mellitus without complication   . Stroke    Past Surgical History: History reviewed. No pertinent past surgical history.  Assessment & Plan Clinical Impression:Richard [name] is a 69 y.o. 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 By report patient lives with his wife independent prior to admission. Presented 12/10/2014 with aphasia and right sided weakness. No recent head trauma, no use of anticoagulation or antiplatelets. Systolic blood pressure greater than 200 in the ED. CT of the brain showed evidence of lobar left frontal ICH measuring 3.5 x 2.5 cm. Mild surrounding vasogenic edema but no hydrocephalus. MRI of the brain showed acute left posterior frontal intracerebral hemorrhage 30 x 24 x 32 mm favored to represent a lobar hypertensive bleed. No sign suggestive of vascular malformation or cortical venous thrombosis. Echocardiogram with ejection fraction 56% grade 1 diastolic dysfunction. Neurology consulted advise conservative care follow-up cranial CT scan stable. Tolerating a regular consistency diet. Close  monitoring of blood pressure. Physical and occupational therapy evaluations completed with recommendations of physical medicine rehabilitation consult.  Patient transferred to CIR on 12/16/2014 .   Patient currently requires min with mobility secondary to muscle weakness, unbalanced muscle activation, ataxia and decreased coordination, decreased attention to right, decreased awareness, decreased problem solving, decreased safety awareness, decreased memory and delayed processing and decreased standing balance, decreased postural control and decreased balance strategies.  Prior to hospitalization, patient was independent  with mobility and lived with Spouse, Family in a House home.  Home access is 3 small stepsStairs to enter.  Patient will benefit from skilled PT intervention to maximize safe functional mobility, minimize fall risk and decrease caregiver burden for planned discharge home with 24 hour supervision.  Anticipate patient will benefit from follow up [place] at discharge.  PT - End of Session Activity Tolerance: Endurance does not limit participation in activity Endurance Deficit: No PT Assessment Rehab Potential (ACUTE/IP ONLY): Excellent PT Patient demonstrates impairments in the following area(s): Balance;Behavior;Perception;Safety;Endurance;Sensory;Motor PT Transfers Functional Problem(s): Bed Mobility;Bed to Chair;Car;Furniture;Floor PT Locomotion Functional Problem(s): Ambulation;Wheelchair Mobility;Stairs PT Plan PT Intensity: Minimum of 1-2 x/day ,45 to 90 minutes PT Frequency: 5 out of 7 days PT Duration Estimated Length of Stay: 8-10 days PT Treatment/Interventions: Ambulation/gait training;Balance/vestibular training;DME/adaptive equipment instruction;Functional mobility training;Neuromuscular re-education;Patient/family education;Psychosocial support;Stair training;Therapeutic Exercise;UE/LE Coordination activities;Wheelchair propulsion/positioning;Visual/perceptual  remediation/compensation;UE/LE Strength taining/ROM;Therapeutic Activities;Community reintegration;Disease management/prevention;Cognitive remediation/compensation;Discharge planning PT Transfers Anticipated Outcome(s): mod I PT Locomotion Anticipated Outcome(s): ambulatory with supervision in home and community PT Recommendation Recommendations for Other Services: Speech consult Follow Up Recommendations: Home health PT Patient destination: Home Equipment Recommended: None recommended by PT  Skilled Therapeutic Intervention Pt received seated in recliner; no c/o pain and agreeable to treatment. PT initial evaluation  performed and completed. Assessed all mobility as described below, as well as Berg Balance Scale. Pt and daughter educated regarding rehab process, daily schedule, use of call bell system, as well as estimated length of stay and supervision/mod I goals. Pt and daughter agreeable and state understanding to all the above. Pt remained seated in w/c with all needs in reach at completion of session.   PT Evaluation Precautions/Restrictions Precautions Precautions: Fall Restrictions Weight Bearing Restrictions: No General Chart Reviewed: Yes Response to Previous Treatment: Patient reporting fatigue but able to participate. Family/Caregiver Present: Yes Vital Signs  Pain Pain Assessment Pain Assessment: No/denies pain Pain Score: 0-No pain Home Living/Prior Functioning Home Living Living Arrangements: Spouse/significant other;Other relatives;Children Available Help at Discharge: Family;Available 24 hours/day Type of Home: House Home Access: Stairs to enter [employer] of Steps: 3 small steps Entrance Stairs-Rails: Right (R rail (different from report given to OT- daughter will bring in picture)) Home Layout: Two level;Able to live on main level with bedroom/bathroom Alternate Level Stairs-Number of Steps: full flight Alternate Level Stairs-Rails: Right;Left (half  R rail, half L rail) Bathroom Shower/Tub: Tub/shower unit;Curtain Bathroom Toilet: Standard Bathroom Accessibility: Yes  Lives With: Spouse;Family Prior Function Level of Independence: Independent with basic ADLs;Independent with homemaking with ambulation;Independent with gait;Independent with transfers  Able to Take Stairs?: Yes Driving: Yes Vocation: Retired Leisure: Hobbies-yes (Comment) (golf, reading, music) Vision/Perception  Vision - Assessment Additional Comments: impaired depth perception Perception Comments: decreased attention to LUE>LLE  Cognition Overall Cognitive Status: Impaired/Different from baseline Arousal/Alertness: Awake/alert Orientation Level: Oriented X4 Attention: Sustained Focused Attention: Appears intact Sustained Attention: Appears intact Memory: Impaired (not formally assessed but pt reports impairments) Memory Impairment: Decreased recall of new information Awareness: Impaired Awareness Impairment: Emergent impairment Problem Solving: Impaired Problem Solving Impairment: Functional complex;Verbal complex Executive Function: Self Monitoring;Self Correcting Self Monitoring: Impaired Self Monitoring Impairment: Verbal complex Self Correcting: Impaired Self Correcting Impairment: Verbal complex Behaviors: Impulsive Safety/Judgment: Impaired Comments: very impulsive, delayed processing of verbal instructions and proceeds with mobility task before comprehending Sensation Sensation Light Touch: Impaired Detail Light Touch Impaired Details: Absent RUE;Impaired RLE Stereognosis: Not tested Stereognosis Impaired Details: Absent RUE Hot/Cold: Not tested Proprioception: Impaired Detail Proprioception Impaired Details: Absent RUE;Impaired RLE Coordination Gross Motor Movements are Fluid and Coordinated: No Fine Motor Movements are Fluid and Coordinated: No Coordination and Movement Description: Ataxic movements RUE, mild dysmetria RUE exacerbated by  impaired depth perception Finger Nose Finger Test: Impaired on RUE due to impaired proprioception and grading of movement Heel Shin Test: Decreased excursion bilaterally, however WFL Motor  Motor Motor: Ataxia Motor - Skilled Clinical Observations: mild ataxia, decreased attention to RLE creating safety concerns during gait  Mobility Bed Mobility Bed Mobility: Rolling Left;Left Sidelying to Sit Rolling Right: 4: Min assist Rolling Right Details: Verbal cues for precautions/safety;Verbal cues for technique;Tactile cues for weight shifting Rolling Left: 5: Supervision Rolling Left Details: Verbal cues for precautions/safety;Verbal cues for technique Right Sidelying to Sit: 4: Min guard Left Sidelying to Sit: 5: Supervision Left Sidelying to Sit Details: Verbal cues for precautions/safety Transfers Transfers: Yes Sit to Stand: 4: Min guard Sit to Stand Details: Tactile cues for posture;Verbal cues for precautions/safety Stand to Sit: 4: Min guard Stand to Sit Details (indicate cue type and reason): Tactile cues for posture;Verbal cues for precautions/safety;Verbal cues for technique Stand Pivot Transfers: 4: Min assist Stand Pivot Transfer Details: Tactile cues for placement;Verbal cues for precautions/safety;Verbal cues for sequencing Stand Pivot Transfer Details (indicate cue type and reason): tactile cue for RUE  to armrest due to impaired proprioception and R inattention Locomotion  Ambulation Ambulation Distance (Feet): 220 Feet Gait Gait: Yes Gait Pattern: Impaired Gait Pattern: Ataxic;Lateral hip instability;Poor foot clearance - right High Level Ambulation High Level Ambulation: Head turns Head Turns: minA, mild LOB Stairs / Additional Locomotion Stairs: Yes Stairs Assistance: 4: Min assist Stairs Assistance Details: Verbal cues for technique;Verbal cues for precautions/safety;Tactile cues for placement Stairs Assistance Details (indicate cue type and reason): cues for  attention to and placement of RUE Stair Management Technique: Two rails;Alternating pattern Number of Stairs: 12 Height of Stairs: 6 Wheelchair Mobility Wheelchair Mobility: Yes Wheelchair Assistance: 4: Advertising account executive Details: Verbal cues for technique;Verbal cues for precautions/safety;Tactile cues for placement (cues for RUE attention/safety) Wheelchair Propulsion: Both upper extremities Wheelchair Parts Management: Needs assistance Distance: 175'  Trunk/Postural Assessment  Cervical Assessment Cervical Assessment: Within Functional Limits Thoracic Assessment Thoracic Assessment: Within Functional Limits Lumbar Assessment Lumbar Assessment: Within Functional Limits Postural Control Postural Control: Deficits on evaluation (impaired with narrow BOS, vision occluded, cognitive dual task)  Balance Balance Balance Assessed: Yes Standardized Balance Assessment Standardized Balance Assessment: Berg Balance Test Berg Balance Test Sit to Stand: Able to stand without using hands and stabilize independently Standing Unsupported: Able to stand safely 2 minutes Sitting with Back Unsupported but Feet Supported on Floor or Stool: Able to sit safely and securely 2 minutes Stand to Sit: Controls descent by using hands Transfers: Able to transfer safely, definite need of hands Standing Unsupported with Eyes Closed: Able to stand 10 seconds with supervision Standing Ubsupported with Feet Together: Able to place feet together independently and stand for 1 minute with supervision From Standing, Reach Forward with Outstretched Arm: Can reach forward >12 cm safely (5") From Standing Position, Pick up Object from Floor: Able to pick up shoe, needs supervision From Standing Position, Turn to Look Behind Over each Shoulder: Looks behind one side only/other side shows less weight shift Turn 360 Degrees: Able to turn 360 degrees safely but slowly Standing Unsupported, Alternately Place  Feet on Step/Stool: Able to stand independently and complete 8 steps >20 seconds Standing Unsupported, One Foot in Front: Able to take small step independently and hold 30 seconds Standing on One Leg: Tries to lift leg/unable to hold 3 seconds but remains standing independently Total Score: 41 Static Sitting Balance Static Sitting - Balance Support: No upper extremity supported;Feet supported Static Sitting - Level of Assistance: 5: Stand by assistance Dynamic Sitting Balance Dynamic Sitting - Balance Support: No upper extremity supported;Feet supported Dynamic Sitting - Level of Assistance: 5: Stand by assistance Dynamic Sitting - Balance Activities: Lateral lean/weight shifting;Forward lean/weight shifting Static Standing Balance Static Standing - Balance Support: No upper extremity supported Static Standing - Level of Assistance: 5: Stand by assistance Static Standing - Comment/# of Minutes: x2 min during Berg Static Stance: Eyes closed Static Stance: Eyes Closed: x30 sec in [place], standbyA Dynamic Standing Balance Dynamic Standing - Balance Support: During functional activity;No upper extremity supported Dynamic Standing - Level of Assistance: 4: Min assist Dynamic Standing - Balance Activities: Lateral lean/weight shifting;Forward lean/weight shifting Extremity Assessment  RUE Assessment RUE Assessment: Exceptions to [facility] (mild decrease in R grip strength compared to L, ataxic movements) LUE Assessment LUE Assessment: Within Functional Limits RLE Assessment RLE Assessment: Within Functional Limits (grossly 4+/5 to 5/5 throughout) LLE Assessment LLE Assessment: Within Functional Limits (grossly 4+/5 to 5/5 throughout)   See Function Navigator for Current Functional Status.   Refer to Care Plan for Long  Term Goals  Recommendations for other services: None  Discharge Criteria: Patient will be discharged from PT if patient refuses treatment 3 consecutive times without medical  reason, if treatment goals not met, if there is a change in medical status, if patient makes no progress towards goals or if patient is discharged from hospital.  The above assessment, treatment plan, treatment alternatives and goals were discussed and mutually agreed upon: by patient and by family  Luberta Mutter 12/17/2014, 2:24 PM

## 2014-12-17 NOTE — Evaluation (Signed)
Speech Language Pathology Assessment and Plan  Patient Details  Name: Richard [name] MRN: [id number] Date of Birth: 14-Aug-1945  SLP Diagnosis: Aphasia;Cognitive Impairments;Dysphagia  Rehab Potential: Excellent ELOS: anticipated d/c date of 9/30     Today's Date: 12/17/2014 SLP Individual Time: 4536-4680 SLP Individual Time Calculation (min): 60 min   Problem List:  Patient Active Problem List   Diagnosis Date Noted  . 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 Type 2 diabetes mellitus with peripheral neuropathy   . Essential hypertension   . Alcohol withdrawal   . Cerebral hemorrhage   . Endotracheally intubated   . ICH (intracerebral hemorrhage) 12/10/2014   Past Medical History:  Past Medical History  Diagnosis Date  . Diabetes mellitus without complication   . Stroke    Past Surgical History: History reviewed. No pertinent past surgical history.  Assessment / Plan / Recommendation Clinical Impression  Richard [name] is a 69 y.o. right handed male with history of CVA with little residual weakness. Patient lives with his wife, independent prior to admission. Presented 12/10/2014 with aphasia and right sided weakness.  CT of the brain showed evidence of lobar left frontal ICH measuring. MRI of the brain showed acute left posterior frontal intracerebral hemorrhage favored to represent a lobar hypertensive bleed.  Patient did require intubation for a short time due to acute respiratory failure and extubated 12/14/2014. Patient was admitted for a comprehensive rehabilitation program on 9/20. SLP evaluation completed on 12/17/2014 with the following results.  Pt presents with a mild orally based dysphagia resulting from right sided oral motor weakness characterized by slightly prolonged but effective mastication of solids and mild, intermittent anterior loss of boluses due to decreased labial seal. No overt s/s of aspiration were evident across solids or liquids.  Recommend that pt remain on dys 3  textures and thin liquids.  Prognosis for advancement is good with trials of regular textures with SLP.   Pt also presents with a moderate expressive aphasia characterized by word retrieval deficits, semantic and phonemic paraphasic errors, and perseveration.  Question mild higher level receptive deficits versus hearing impairment. Pt demonstrates good intellectual awareness of verbal errors but requires cues to correct errors in the moment.  Informally pt presents with mild higher level deficits for decreased safety awareness/impulsivity and decreased recall of new information.  Question right inattention versus decreased proprioception in right upper extremity.    Pt was independent prior to admission and has experienced a loss of function s/p hemorrhage.  As a result, pt would benefit from skilled ST while inpatient in order to maximize functional independence and reduce burden of care prior to discharge.  Anticipate that pt would benefit from 24/7 supervision and continued ST follow up at next level of care.    Skilled Therapeutic Interventions          Cognitive-linguistic and bedside swallow evaluation completed with results and recommendations reviewed with family.  Pt and pt's daughters were educated regarding results of evaluation and treatment plan was discussed.  Pt and pt's family in agreement with recommended plan of care.      SLP Assessment  Patient will need skilled Speech Lanaguage Pathology Services during CIR admission    Recommendations  SLP Diet Recommendations: Dysphagia 3 (Mech soft);Thin Liquid Administration via: Cup;Straw Medication Administration: Whole meds with liquid Supervision: Patient able to self feed;Intermittent supervision to cue for compensatory strategies Compensations: Slow rate;Small sips/bites;Check for pocketing Oral Care Recommendations: Oral care BID Patient destination: Home Follow up Recommendations: 24 hour supervision/assistance;Outpatient SLP;Home  Health SLP Equipment Recommended: None recommended by SLP    SLP Frequency 3 to 5 out of 7 days   SLP Treatment/Interventions Cognitive remediation/compensation;Cueing hierarchy;Functional tasks;Environmental controls;Dysphagia/aspiration precaution training;Internal/external aids;Patient/family education;Speech/Language facilitation;Multimodal communication approach   Pain Pain Assessment Pain Assessment: No/denies pain Pain Score: 0-No pain Prior Functioning Cognitive/Linguistic Baseline: Within functional limits Type of Home: House  Lives With: Spouse;Family Available Help at Discharge: Family;Available 24 hours/day Vocation: Retired  Function:  Eating Eating   Modified Consistency Diet: Yes Eating Assist Level: More than reasonable amount of time           Cognition Comprehension Comprehension assist level: Understands basic 90% of the time/cues < 10% of the time  Expression   Expression assist level: Expresses basic 50 - 74% of the time/requires cueing 25 - 49% of the time. Needs to repeat parts of sentences.  Social Interaction Social Interaction assist level: Interacts appropriately 75 - 89% of the time - Needs redirection for appropriate language or to initiate interaction.  Problem Solving Problem solving assist level: Solves basic 75 - 89% of the time/requires cueing 10 - 24% of the time  Memory Memory assist level: Recognizes or recalls 50 - 74% of the time/requires cueing 25 - 49% of the time   Short Term Goals: Week 1: SLP Short Term Goal 1 (Week 1): Pt will monitor and correct verbal errors during conversations for 75% accuracy with min assist verbal cues.  SLP Short Term Goal 2 (Week 1): Pt will consume trials of regular textures with mod I use of lingual sweep and liquid wash to manage oral residue over three targeted sessions prior to diet advancement.   SLP Short Term Goal 3 (Week 1): Pt will utilize compensatory word finding strategies to make needs/wants  known during conversations with min assist verbal cues.  SLP Short Term Goal 4 (Week 1): Pt will complete basic to semi-complex self care and/or home management tasks with min assist verbal cues for functional problem solving.   SLP Short Term Goal 5 (Week 1): Pt will utilize compensatory memory aids to faciliitate recall of new information with min assist verbal cues.    Refer to Care Plan for Long Term Goals  Recommendations for other services: None  Discharge Criteria: Patient will be discharged from SLP if patient refuses treatment 3 consecutive times without medical reason, if treatment goals not met, if there is a change in medical status, if patient makes no progress towards goals or if patient is discharged from hospital.  The above assessment, treatment plan, treatment alternatives and goals were discussed and mutually agreed upon: by patient and by family  Emilio Math 12/17/2014, 12:37 PM

## 2014-12-17 NOTE — Plan of Care (Signed)
Problem: RH BLADDER ELIMINATION Goal: RH STG MANAGE BLADDER WITH EQUIPMENT WITH ASSISTANCE STG Manage Bladder With Equipment With min Assistance  Outcome: Not Progressing Foley catheter

## 2014-12-17 NOTE — Progress Notes (Signed)
Social Work Patient ID: Richard [name], male   DOB: 17-Oct-1945, 69 y.o.   MRN: [id number] Met with pt and daughter to discuss initial team conference goals-supervision/mod/i level and discharge target 9/30.  Both pleased with the target date and thought he would be here longer. Aware team conference meeting weekly and will work on discharge plans. Daughter will rely information to her brother.

## 2014-12-17 NOTE — Progress Notes (Signed)
69 y.o. 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 By report patient lives with his wife independent prior to admission. Presented 12/10/2014 with aphasia and right sided weakness. No recent head trauma, no use of anticoagulation or antiplatelets. Systolic blood pressure greater than 200 in the ED. CT of the brain showed evidence of lobar left frontal ICH measuring 3.5 x 2.5 cm. Mild surrounding vasogenic edema but no hydrocephalus. MRI of the brain showed acute left posterior frontal intracerebral hemorrhage 30 x 24 x 32 mm favored to represent a lobar hypertensive bleed. No sign suggestive of vascular malformation or cortical venous thrombosis. Patient did require intubation for a short time due to acute respiratory failure and extubated 12/14/2014. Echocardiogram with ejection fraction 36% grade 1 diastolic dysfunction. Carotid Dopplers at no ICA stenosis. EEG with moderately severe generalized continuous nonspecific slowing consistent with encephalopathy. No evidence of seizure. Neurology consulted advise conservative care follow-up cranial CT scan stable   Subjective/Complaints: Pt in bed visiting with daughter,asking about foley No BM x 3 days  Pt denies N/V or abd pain, no breathing issues Objective: Vital Signs: Blood pressure 129/80, pulse 85, temperature 98.7 F (37.1 C), temperature source Oral, resp. rate 19, height $RemoveBe'5\' 11"'HGctsCnYq$  (1.803 m), weight 90.039 kg (198 lb 8 oz), SpO2 98 %. No results found. Results for orders placed or performed during the hospital encounter of 12/16/14 (from the past 72 hour(s))  Glucose, capillary     Status: Abnormal   Collection Time: 12/16/14 10:21 PM  Result Value Ref Range   Glucose-Capillary 186 (H) 65 - 99 mg/dL   Comment 1 Notify RN   CBC     Status: Abnormal   Collection Time: 12/16/14 10:31 PM  Result Value Ref Range   WBC 10.5 4.0 - 10.5 K/uL    RBC 4.22 4.22 - 5.81 MIL/uL   Hemoglobin 13.0 13.0 - 17.0 g/dL   HCT 37.0 (L) 39.0 - 52.0 %   MCV 87.7 78.0 - 100.0 fL   MCH 30.8 26.0 - 34.0 pg   MCHC 35.1 30.0 - 36.0 g/dL   RDW 11.4 (L) 11.5 - 15.5 %   Platelets 248 150 - 400 K/uL  Creatinine, serum     Status: None   Collection Time: 12/16/14 10:31 PM  Result Value Ref Range   Creatinine, Ser 1.07 0.61 - 1.24 mg/dL   GFR calc non Af Amer >60 >60 mL/min   GFR calc Af Amer >60 >60 mL/min    Comment: (NOTE) The eGFR has been calculated using the CKD EPI equation. This calculation has not been validated in all clinical situations. eGFR's persistently <60 mL/min signify possible Chronic Kidney Disease.   CBC WITH DIFFERENTIAL     Status: Abnormal   Collection Time: 12/17/14  5:56 AM  Result Value Ref Range   WBC 8.4 4.0 - 10.5 K/uL   RBC 4.11 (L) 4.22 - 5.81 MIL/uL   Hemoglobin 12.6 (L) 13.0 - 17.0 g/dL   HCT 36.1 (L) 39.0 - 52.0 %   MCV 87.8 78.0 - 100.0 fL   MCH 30.7 26.0 - 34.0 pg   MCHC 34.9 30.0 - 36.0 g/dL   RDW 11.3 (L) 11.5 - 15.5 %   Platelets 254 150 - 400 K/uL   Neutrophils Relative % 53 %   Neutro Abs 4.5 1.7 - 7.7 K/uL   Lymphocytes Relative 27 %   Lymphs Abs 2.2 0.7 - 4.0 K/uL  Monocytes Relative 17 %   Monocytes Absolute 1.5 (H) 0.1 - 1.0 K/uL   Eosinophils Relative 2 %   Eosinophils Absolute 0.2 0.0 - 0.7 K/uL   Basophils Relative 1 %   Basophils Absolute 0.0 0.0 - 0.1 K/uL  Comprehensive metabolic panel     Status: Abnormal   Collection Time: 12/17/14  5:56 AM  Result Value Ref Range   Sodium 136 135 - 145 mmol/L   Potassium 3.1 (L) 3.5 - 5.1 mmol/L   Chloride 99 (L) 101 - 111 mmol/L   CO2 28 22 - 32 mmol/L   Glucose, Bld 173 (H) 65 - 99 mg/dL   BUN 12 6 - 20 mg/dL   Creatinine, Ser 1.02 0.61 - 1.24 mg/dL   Calcium 9.1 8.9 - 10.3 mg/dL   Total Protein 6.5 6.5 - 8.1 g/dL   Albumin 3.0 (L) 3.5 - 5.0 g/dL   AST 33 15 - 41 U/L   ALT 37 17 - 63 U/L   Alkaline Phosphatase 62 38 - 126 U/L   Total  Bilirubin 1.1 0.3 - 1.2 mg/dL   GFR calc non Af Amer >60 >60 mL/min   GFR calc Af Amer >60 >60 mL/min    Comment: (NOTE) The eGFR has been calculated using the CKD EPI equation. This calculation has not been validated in all clinical situations. eGFR's persistently <60 mL/min signify possible Chronic Kidney Disease.    Anion gap 9 5 - 15  Glucose, capillary     Status: Abnormal   Collection Time: 12/17/14  6:40 AM  Result Value Ref Range   Glucose-Capillary 173 (H) 65 - 99 mg/dL      General: No acute distress Mood and affect are appropriate Eyes- prosthetic Left eye, 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 Heart: Regular rate and rhythm no rubs murmurs or extra sounds Lungs: Clear to auscultation, breathing unlabored, no rales or wheezes Abdomen: Positive bowel sounds, soft nontender to palpation, nondistended Extremities: No clubbing, cyanosis, or edema Skin: No evidence of breakdown, no evidence of rash GU:  Foley                  Neurologic: Cranial nerves II through XII intact, motor strength is 5/5 in bilateral deltoid, bicep, tricep, grip, hip flexor, knee extensors, ankle dorsiflexor and plantar flexor Sensory exam reduced  sensation to light touch and proprioception in right  upper and lower extremities Cerebellar exam normal finger to nose to finger as well as heel to shin in left  upper and lower extremities, moderate dysmetria on RIght Musculoskeletal: Full range of motion in all 4 extremities. No joint swelling   Assessment/Plan: 1. Functional deficits secondary to left posterior frontal intracranial hemorrhage felt to be secondary to hypertensive crisis with right sided ataxia and sensory deficits as well as aphasiawhich require 3+ hours per day of interdisciplinary therapy in a comprehensive inpatient rehab setting. Physiatrist is providing close team supervision and 24 hour management of active medical problems listed below. Physiatrist and rehab team continue to assess barriers to  discharge/monitor patient progress toward functional and medical goals. FIM:                   Function - Comprehension Comprehension: Auditory Comprehension assist level: Understands basic 90% of the time/cues < 10% of the time  Function - Expression Expression: Verbal Expression assist level: Expresses basic 90% of the time/requires cueing < 10% of the time.  Function - Social Interaction Social Interaction assist level: Interacts appropriately with  others - No medications needed.  Function - Problem Solving Problem solving assist level: Solves basic 90% of the time/requires cueing < 10% of the time  Function - Memory Memory assist level: Recognizes or recalls 75 - 89% of the time/requires cueing 10 - 24% of the time Patient normally able to recall (first 3 days only): That he or she is in a hospital, Staff names and faces, Current season Medical Problem List and Plan: 1. Functional deficits secondary to left posterior frontal intracranial hemorrhage felt to be secondary to hypertensive crisis 2.  DVT Prophylaxis/Anticoagulation: Subcutaneous heparin initiated 12/12/2014. Monitor platelet counts and any signs of bleeding 3. Pain Management: Tylenol as needed 4. Acute respiratory failure. Extubated 12/14/2014. Comfortable/no distress currently 5. Neuropsych: This patient is capable of making decisions on his own behalf. 6. Skin/Wound Care: Routine skin checks 7. Fluids/Electrolytes/Nutrition: Routine I&O with follow-up chemistries in am 8. Hypertension. Norvasc 10 mg daily, Avapro 300 mg daily, hydrochlorothiazide 25 mg daily. Monitor with increased mobility---fair control at present 9. Diabetes mellitus of peripheral neuropathy. Hemoglobin A1c 7.5. Lantus insulin 10 units daily. Check blood sugars before meals and at bedtime.             -need to follow for consistent pattern before adjusting regimen 10. History of alcohol use. Provide counseling. Monitor for any signs of  withdrawal 11. Hyperlipidemia. Lipitor 12.  Hypokalemia-KCL, discussed hi potassium foods 13.  Constipation-senna S increase to BID, dulcolax  14.  Urinary retention- voiding trial LOS (Days) 1 A FACE TO FACE EVALUATION WAS PERFORMED  Saleema Weppler E 12/17/2014, 9:18 AM

## 2014-12-17 NOTE — Patient Care Conference (Signed)
Inpatient RehabilitationTeam Conference and Plan of Care Update Date: 12/17/2014   Time: 11;30 AM    Patient Name: Richard [name]      Medical Record Number: [id number]  Date of Birth: 1945/04/18 Sex: Male         Room/Bed: 4W21C/4W21C-01 Payor Info: Payor: [employer] MED[employer]ARE / Plan: [employer] MEDICARE HMO/PPO / Product Type: *No Product type* /    Admitting Diagnosis: L ICH  Admit Date/Time:  12/16/2014  9:42 PM Admission Comments: No comment available   Primary Diagnosis:  ICH (intracerebral hemorrhage) Principal Problem: ICH (intracerebral hemorrhage)  Patient Active Problem List   Diagnosis Date Noted  . 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 Type 2 diabetes mellitus with peripheral neuropathy   . Essential hypertension   . Alcohol withdrawal   . Cerebral hemorrhage   . Endotracheally intubated   . ICH (intracerebral hemorrhage) 12/10/2014    Expected Discharge Date: Expected Discharge Date: 12/26/14  Team Members Present: Physician leading conference: Dr. Claudette Laws Social Worker Present: Dossie Der, LCSW Nurse Present: Carmie End, RN PT Present: Alyson Reedy, PT OT Present: Rosalio Loud, OT SLP Present: Jackalyn Lombard, SLP PPS Coordinator present : Tora Duck, RN, CRRN     Current Status/Progress Goal Weekly Team Focus  Medical   poor sensation, ataxia  Mod I/ Sup for safety  initiate rehab   Bowel/Bladder     timed toileting-cont   cont B & B     Swallow/Nutrition/ Hydration   Dys 3, thin liquids   mod I with least restrictive diet   trials of advanced consistencies    ADL's   Mod assist LB dressing, min/steady assist bathing and dressing, RUE impaired proprioception and sensation   Mod I self-care tasks, supervision overall for safety  RUE attention, NMR, transfers, safety awareness   Mobility   min assist transfers and gait x 150'  modified independent basic transfers and w/c x 150' controlled enc; supervision car transfers and gait x 500' controlled env, 300'  community env and up/down 12 ^' steps  neuro re-ed, pt ed, activity tolerance, balance, gait   Communication   moderate expressive aphasia   min assist   recognition and correction of verbal errors, education and carryover of compensatory strategies    Safety/Cognition/ Behavioral Observations  min deficits, right inattention, decreased emergent awareness of deficits  supervision   address inattention, awareness of deficits, basic to semi-complex tasks     Pain     less than 3        Skin     monitor skin-no issues           *See Care Plan and progress notes for long and short-term goals.  Barriers to Discharge: limited caregiver availabilty    Possible Resolutions to Barriers:  will need assist for cooking, no driving post d/c, 40/9 vs intermittent sup    Discharge Planning/Teaching Needs:    Home with wife, son and sister in-law between them they can provide 24 hr supervision. Daughter here from [place] until Sun.     Team Discussion:  New eval-goals supervision/mod/i level.  Will need supervision due to cognition and also impulsive.  Has no sensation of his right arm  Revisions to Treatment Plan:  New eval   Continued Need for Acute Rehabilitation Level of Care: The patient requires daily medical management by a physician with specialized training in physical medicine and rehabilitation for the following conditions: Daily direction of a multidisciplinary physical rehabilitation program to ensure safe treatment while eliciting  the highest outcome that is of practical value to the patient.: Yes Daily medical management of patient stability for increased activity during participation in an intensive rehabilitation regime.: Yes Daily analysis of laboratory values and/or radiology reports with any subsequent need for medication adjustment of medical intervention for : Neurological problems;Other  Lucy Chris 12/19/2014, 9:19 AM

## 2014-12-17 NOTE — Progress Notes (Signed)
Patient admitted to 4w 21 via staff, family @ bedside. Patient a&o, no c/o pain. Glasses on patient, oriented to rehab, signed safety fall sheet. Will continue to monitor.

## 2014-12-18 ENCOUNTER — Inpatient Hospital Stay (HOSPITAL_COMMUNITY): Payer: Medicare HMO | Admitting: Speech Pathology

## 2014-12-18 ENCOUNTER — Inpatient Hospital Stay (HOSPITAL_COMMUNITY): Payer: Medicare HMO

## 2014-12-18 LAB — BASIC METABOLIC PANEL
ANION GAP: 9 (ref 5–15)
BUN: 15 mg/dL (ref 6–20)
CALCIUM: 9.3 mg/dL (ref 8.9–10.3)
CO2: 29 mmol/L (ref 22–32)
CREATININE: 1.19 mg/dL (ref 0.61–1.24)
Chloride: 96 mmol/L — ABNORMAL LOW (ref 101–111)
Glucose, Bld: 178 mg/dL — ABNORMAL HIGH (ref 65–99)
Potassium: 3.5 mmol/L (ref 3.5–5.1)
SODIUM: 134 mmol/L — AB (ref 135–145)

## 2014-12-18 LAB — URINE CULTURE

## 2014-12-18 LAB — GLUCOSE, CAPILLARY
GLUCOSE-CAPILLARY: 218 mg/dL — AB (ref 65–99)
GLUCOSE-CAPILLARY: 245 mg/dL — AB (ref 65–99)
GLUCOSE-CAPILLARY: 241 mg/dL — AB (ref 65–99)
Glucose-Capillary: 202 mg/dL — ABNORMAL HIGH (ref 65–99)

## 2014-12-18 NOTE — Progress Notes (Signed)
Pt LBM on 12/14/14 PTA. Sorbitol given with no results. Pt has been passing gas this AM and feels that a BM will come soon. Abdomen distended. Will continue to monitor. Rudie Meyer, RN

## 2014-12-18 NOTE — Progress Notes (Signed)
Occupational Therapy Session Note  Patient Details  Name: Richard [name] MRN: [id number] Date of Birth: 04-20-1945  Today's Date: 12/18/2014 OT Individual Time: 0900-1000 OT Individual Time Calculation (min): 60 min    Short Term Goals: Week 1:  OT Short Term Goal 1 (Week 1): Pt will complete bathing at sit > stand level in shower with supevision OT Short Term Goal 2 (Week 1): Pt will complete LB dressing with min assist OT Short Term Goal 3 (Week 1): Pt will complete 2 grooming tasks in standing with supervision OT Short Term Goal 4 (Week 1): Pt will complete toilet transfers with supervision with LRAD OT Short Term Goal 5 (Week 1): Pt will utilize RUE as nondominant UE during self-care tasks  Skilled Therapeutic Interventions/Progress Updates:    Pt resting in w/c upon arrival with daughter and son present.  Pt declined bathing this morning but donned shirt, pants, and socks/shoes.  Pt stood at sink to brush teeth.  Pt reluctant to use R hand to brush teeth and completed task with L hand.  Pt elected to sit to complete shaving tasks, again using L hand for task.  Pt used R hand to assist with placing shaving cream on face.  Pt required assistance for thoroughness.  See function navigator for functional status.  Pt transitioned to R hand tasks with focus on controlled grasp of large pegs and placing them on peg board.  Focus on standing balance and increased control with R hand to increase independence with BADLs.  Therapy Documentation Precautions:  Precautions Precautions: Fall Restrictions Weight Bearing Restrictions: No    Pain:  Pt c/o R hand pain and decreased sensation  See Function Navigator for Current Functional Status.   Therapy/Group: Individual Therapy  Rich Brave 12/18/2014, 10:05 AM

## 2014-12-18 NOTE — Progress Notes (Signed)
Physical Therapy Session Note  Patient Details  Name: Richard [name] MRN: [id number] Date of Birth: 10-22-1945  Today's Date: 12/18/2014 PT Individual Time: 1110-1210 PT Individual Time Calculation (min): 60 min   Short Term Goals: Week 1:  PT Short Term Goal 1 (Week 1): =LTG due to estimated LOS  Skilled Therapeutic Interventions/Progress Updates:  Gait without AD x 150' with min assist due to mild LOB with cross over RLE when fatigued.  Therapeutic activities for balance retraining, see below.  Pt returned to room; dtr present.  PT set pt up for lunch and all needs within reach.    Therapy Documentation Precautions:  Precautions Precautions: Fall Restrictions Weight Bearing Restrictions: No   Pain: Pain Assessment Pain Assessment: No/denies pain  Standardized Balance Assessment Standardized Balance Assessment: Dynamic Gait Index Dynamic Gait Index Level Surface: Moderate Impairment Change in Gait Speed: Normal Gait with Horizontal Head Turns: Moderate Impairment Gait with Vertical Head Turns: Mild Impairment Gait and Pivot Turn: Normal Step Over Obstacle: Mild Impairment Step Around Obstacles: Mild Impairment Steps: Mild Impairment Total Score: 16 Dynamic Standing Balance Dynamic Standing - Balance Support: During functional activity;No upper extremity supported Dynamic Standing - Level of Assistance: 4: Min assist;3: Mod assist Dynamic Standing - Balance Activities: Compliant surfaces;Lateral lean/weight shifting;Forward lean/weight shifting Dynamic Standing - Comments: ankle strategy and hip strategy evident during external perturbations, but delayed Exercises:   Other Treatments: Treatments Therapeutic Activity: in unsupported sitting: reaching out of BOS with R/L hands to facilitate trunk shortening/lengthening; standing: floor clock focusing on balance during LE crossing over; standing on compliant Airex mat during wt shifting,mini squats, calf raises and toe  raises wtih UE support PRN Neuromuscular Facilitation: Right;Lower Extremity;Forced use;Activity to increase sustained activation;Activity to increase grading;Activity to increase timing and sequencing;Activity to increase lateral weight shifting;Activity to increase anterior-posterior weight shifting   See Function Navigator for Current Functional Status.   Therapy/Group: Individual Therapy  COOK,CAROLINE 12/18/2014, 12:37 PM

## 2014-12-18 NOTE — Progress Notes (Signed)
Subjective/Complaints: Pt in bed visiting with daughter,pt has voided after foley removal No BM x 3 days Discussed repeat K was normal  Pt denies N/V or abd pain, no breathing issues Objective: Vital Signs: Blood pressure 119/64, pulse 78, temperature 98.4 F (36.9 C), temperature source Oral, resp. rate 18, height 5\' 11"  (1.803 m), weight 91.3 kg (201 lb 4.5 oz), SpO2 100 %. No results found. Results for orders placed or performed during the hospital encounter of 12/16/14 (from the past 72 hour(s))  Glucose, capillary     Status: Abnormal   Collection Time: 12/16/14 10:21 PM  Result Value Ref Range   Glucose-Capillary 186 (H) 65 - 99 mg/dL   Comment 1 Notify RN   CBC     Status: Abnormal   Collection Time: 12/16/14 10:31 PM  Result Value Ref Range   WBC 10.5 4.0 - 10.5 K/uL   RBC 4.22 4.22 - 5.81 MIL/uL   Hemoglobin 13.0 13.0 - 17.0 g/dL   HCT 12/18/14 (L) 91.9 - 69.6 %   MCV 87.7 78.0 - 100.0 fL   MCH 30.8 26.0 - 34.0 pg   MCHC 35.1 30.0 - 36.0 g/dL   RDW 87.7 (L) 00.7 - 13.0 %   Platelets 248 150 - 400 K/uL  Creatinine, serum     Status: None   Collection Time: 12/16/14 10:31 PM  Result Value Ref Range   Creatinine, Ser 1.07 0.61 - 1.24 mg/dL   GFR calc non Af Amer >60 >60 mL/min   GFR calc Af Amer >60 >60 mL/min    Comment: (NOTE) The eGFR has been calculated using the CKD EPI equation. This calculation has not been validated in all clinical situations. eGFR's persistently <60 mL/min signify possible Chronic Kidney Disease.   CBC WITH DIFFERENTIAL     Status: Abnormal   Collection Time: 12/17/14  5:56 AM  Result Value Ref Range   WBC 8.4 4.0 - 10.5 K/uL   RBC 4.11 (L) 4.22 - 5.81 MIL/uL   Hemoglobin 12.6 (L) 13.0 - 17.0 g/dL   HCT 12/19/14 (L) 54.5 - 73.4 %   MCV 87.8 78.0 - 100.0 fL   MCH 30.7 26.0 - 34.0 pg   MCHC 34.9 30.0 - 36.0 g/dL   RDW 57.8 (L) 99.0 - 41.7 %   Platelets 254 150 - 400 K/uL   Neutrophils Relative % 53 %   Neutro Abs 4.5 1.7 - 7.7 K/uL   Lymphocytes Relative 27 %   Lymphs Abs 2.2 0.7 - 4.0 K/uL   Monocytes Relative 17 %   Monocytes Absolute 1.5 (H) 0.1 - 1.0 K/uL   Eosinophils Relative 2 %   Eosinophils Absolute 0.2 0.0 - 0.7 K/uL   Basophils Relative 1 %   Basophils Absolute 0.0 0.0 - 0.1 K/uL  Comprehensive metabolic panel     Status: Abnormal   Collection Time: 12/17/14  5:56 AM  Result Value Ref Range   Sodium 136 135 - 145 mmol/L   Potassium 3.1 (L) 3.5 - 5.1 mmol/L   Chloride 99 (L) 101 - 111 mmol/L   CO2 28 22 - 32 mmol/L   Glucose, Bld 173 (H) 65 - 99 mg/dL   BUN 12 6 - 20 mg/dL   Creatinine, Ser 12/19/14 0.61 - 1.24 mg/dL   Calcium 9.1 8.9 - 4.54 mg/dL   Total Protein 6.5 6.5 - 8.1 g/dL   Albumin 3.0 (L) 3.5 - 5.0 g/dL   AST 33 15 - 41 U/L   ALT  37 17 - 63 U/L   Alkaline Phosphatase 62 38 - 126 U/L   Total Bilirubin 1.1 0.3 - 1.2 mg/dL   GFR calc non Af Amer >60 >60 mL/min   GFR calc Af Amer >60 >60 mL/min    Comment: (NOTE) The eGFR has been calculated using the CKD EPI equation. This calculation has not been validated in all clinical situations. eGFR's persistently <60 mL/min signify possible Chronic Kidney Disease.    Anion gap 9 5 - 15  Glucose, capillary     Status: Abnormal   Collection Time: 12/17/14  6:40 AM  Result Value Ref Range   Glucose-Capillary 173 (H) 65 - 99 mg/dL  Glucose, capillary     Status: Abnormal   Collection Time: 12/17/14 11:12 AM  Result Value Ref Range   Glucose-Capillary 234 (H) 65 - 99 mg/dL   Comment 1 Notify RN   Urinalysis, Routine w reflex microscopic (not at [facility])     Status: Abnormal   Collection Time: 12/17/14  4:24 PM  Result Value Ref Range   Color, Urine YELLOW YELLOW   APPearance CLEAR CLEAR   Specific Gravity, Urine 1.020 1.005 - 1.030   pH 5.0 5.0 - 8.0   Glucose, UA >1000 (A) NEGATIVE mg/dL   Hgb urine dipstick LARGE (A) NEGATIVE   Bilirubin Urine NEGATIVE NEGATIVE   Ketones, ur NEGATIVE NEGATIVE mg/dL   Protein, ur NEGATIVE NEGATIVE mg/dL    Urobilinogen, UA 1.0 0.0 - 1.0 mg/dL   Nitrite NEGATIVE NEGATIVE   Leukocytes, UA NEGATIVE NEGATIVE  Urine microscopic-add on     Status: Abnormal   Collection Time: 12/17/14  4:24 PM  Result Value Ref Range   Squamous Epithelial / LPF RARE RARE   WBC, UA 0-2 <3 WBC/hpf   RBC / HPF 11-20 <3 RBC/hpf   Casts HYALINE CASTS (A) NEGATIVE    Comment: WBC CAST  Glucose, capillary     Status: Abnormal   Collection Time: 12/17/14  5:08 PM  Result Value Ref Range   Glucose-Capillary 249 (H) 65 - 99 mg/dL   Comment 1 Notify RN   Glucose, capillary     Status: Abnormal   Collection Time: 12/17/14  8:57 PM  Result Value Ref Range   Glucose-Capillary 202 (H) 65 - 99 mg/dL  Basic metabolic panel     Status: Abnormal   Collection Time: 12/18/14  6:05 AM  Result Value Ref Range   Sodium 134 (L) 135 - 145 mmol/L   Potassium 3.5 3.5 - 5.1 mmol/L   Chloride 96 (L) 101 - 111 mmol/L   CO2 29 22 - 32 mmol/L   Glucose, Bld 178 (H) 65 - 99 mg/dL   BUN 15 6 - 20 mg/dL   Creatinine, Ser 1.19 0.61 - 1.24 mg/dL   Calcium 9.3 8.9 - 10.3 mg/dL   GFR calc non Af Amer >60 >60 mL/min   GFR calc Af Amer >60 >60 mL/min    Comment: (NOTE) The eGFR has been calculated using the CKD EPI equation. This calculation has not been validated in all clinical situations. eGFR's persistently <60 mL/min signify possible Chronic Kidney Disease.    Anion gap 9 5 - 15  Glucose, capillary     Status: Abnormal   Collection Time: 12/18/14  6:42 AM  Result Value Ref Range   Glucose-Capillary 218 (H) 65 - 99 mg/dL      General: No acute distress Mood and affect are appropriate Eyes- prosthetic Left eye, EOM intact on right  side Heart: Regular rate and rhythm no rubs murmurs or extra sounds Lungs: Clear to auscultation, breathing unlabored, no rales or wheezes Abdomen: Positive bowel sounds, soft nontender to palpation, nondistended Extremities: No clubbing, cyanosis, or edema Skin: No evidence of breakdown, no evidence  of rash GU:  Foley                  Neurologic: Cranial nerves II through XII intact, motor strength is 5/5 in bilateral deltoid, bicep, tricep, grip, hip flexor, knee extensors, ankle dorsiflexor and plantar flexor Sensory exam reduced  sensation to light touch and proprioception in right  upper and lower extremities Cerebellar exam normal finger to nose to finger as well as heel to shin in left  upper and lower extremities, moderate dysmetria on RIght Musculoskeletal: Full range of motion in all 4 extremities. No joint swelling   Assessment/Plan: 1. Functional deficits secondary to left posterior frontal intracranial hemorrhage felt to be secondary to hypertensive crisis with right sided ataxia and sensory deficits as well as aphasiawhich require 3+ hours per day of interdisciplinary therapy in a comprehensive inpatient rehab setting. Physiatrist is providing close team supervision and 24 hour management of active medical problems listed below. Physiatrist and rehab team continue to assess barriers to discharge/monitor patient progress toward functional and medical goals. FIM: Function - Bathing Position: Shower Body parts bathed by patient: Right arm, Left arm, Chest, Abdomen, Front perineal area, Buttocks, Right upper leg, Left upper leg, Right lower leg Body parts bathed by helper: Back Bathing not applicable: Left lower leg Assist Level: Touching or steadying assistance(Pt > 75%)  Function- Upper Body Dressing/Undressing What is the patient wearing?: Pull over shirt/dress Pull over shirt/dress - Perfomed by patient: Thread/unthread right sleeve, Thread/unthread left sleeve, Put head through opening, Pull shirt over trunk Assist Level: Set up, Supervision or verbal cues Set up : To obtain clothing/put away Function - Lower Body Dressing/Undressing What is the patient wearing?: Underwear, Pants, Socks, Shoes Position: Wheelchair/chair at sink Underwear - Performed by patient: Pull  underwear up/down Underwear - Performed by helper: Thread/unthread right underwear leg, Thread/unthread left underwear leg Pants- Performed by patient: Pull pants up/down Pants- Performed by helper: Thread/unthread left pants leg, Thread/unthread right pants leg Socks - Performed by patient: Don/doff right sock, Don/doff left sock Shoes - Performed by patient: Don/doff right shoe, Don/doff left shoe Shoes - Performed by helper: Fasten right, Fasten left Assist Level: Touching or steadying assistance (Pt > 75%)     Function - Toilet Transfers Toilet transfer assistive device: Bedside commode Assist level to bedside commode (at bedside): Touching or steadying assistance (Pt > 75%) Assist level from bedside commode (at bedside): Touching or steadying assistance (Pt > 75%)  Function - Chair/bed transfer Chair/bed transfer method: Stand pivot Chair/bed transfer assist level: Touching or steadying assistance (Pt > 75%) Chair/bed transfer assistive device: Armrests Chair/bed transfer details: Tactile cues for posture, Tactile cues for weight shifting, Verbal cues for sequencing, Verbal cues for precautions/safety  Function - Locomotion: Wheelchair Will patient use wheelchair at discharge?: No Type: Manual Max wheelchair distance: 175 Assist Level: Touching or steadying assistance (Pt > 75%) Assist Level: Touching or steadying assistance (Pt > 75%) Assist Level: Touching or steadying assistance (Pt > 75%) Turns around,maneuvers to table,bed, and toilet,negotiates 3% grade,maneuvers on rugs and over doorsills: No Function - Locomotion: Ambulation Assistive device: No device Max distance: 220 Assist level: Touching or steadying assistance (Pt > 75%) Assist level: Touching or steadying assistance (Pt > 75%) Assist  level: Touching or steadying assistance (Pt > 75%) Assist level: Touching or steadying assistance (Pt > 75%) Assist level: Touching or steadying assistance (Pt > 75%)  Function  - Comprehension Comprehension: Auditory Comprehension assist level: Understands basic 90% of the time/cues < 10% of the time  Function - Expression Expression: Verbal Expression assist level: Expresses basic 75 - 89% of the time/requires cueing 10 - 24% of the time. Needs helper to occlude trach/needs to repeat words.  Function - Social Interaction Social Interaction assist level: Interacts appropriately 75 - 89% of the time - Needs redirection for appropriate language or to initiate interaction.  Function - Problem Solving Problem solving assist level: Solves basic 75 - 89% of the time/requires cueing 10 - 24% of the time  Function - Memory Memory assist level: Recognizes or recalls 75 - 89% of the time/requires cueing 10 - 24% of the time Patient normally able to recall (first 3 days only): That he or she is in a hospital, Current season, Location of own room, Staff names and faces Medical Problem List and Plan: 1. Functional deficits secondary to left posterior frontal intracranial hemorrhage felt to be secondary to hypertensive crisis 2.  DVT Prophylaxis/Anticoagulation: Subcutaneous heparin initiated 12/12/2014. Monitor platelet counts and any signs of bleeding 3. Pain Management: Tylenol as needed 4. Acute respiratory failure. Extubated 12/14/2014. Comfortable/no distress currently 5. Neuropsych: This patient is capable of making decisions on his own behalf. 6. Skin/Wound Care: Routine skin checks 7. Fluids/Electrolytes/Nutrition: Routine I&O with follow-up chemistries in am 8. Hypertension. Norvasc 10 mg daily, Avapro 300 mg daily, hydrochlorothiazide 25 mg daily. Monitor with increased mobility---fair control at present 9. Diabetes mellitus of peripheral neuropathy. Hemoglobin A1c 7.5. Lantus insulin 10 units daily. Check blood sugars before meals and at bedtime.             -CBG elevated this am increase lantus 10. History of alcohol use. Provide counseling. So far no signs of  withdrawal 11. Hyperlipidemia. Lipitor 12.  Hypokalemia improved on KCL, discussed hi potassium foods 13.  Constipation-senna S increase to BID, dulcolax  14.  Urinary retention- voiding trial LOS (Days) 2 A FACE TO FACE EVALUATION WAS PERFORMED  Horice Carrero E 12/18/2014, 9:44 AM

## 2014-12-18 NOTE — Plan of Care (Signed)
Problem: RH SAFETY Goal: RH STG ADHERE TO SAFETY PRECAUTIONS W/ASSISTANCE/DEVICE STG Adhere to Safety Precautions With mod I Assistance/Device.  Outcome: Not Progressing Pt impulsive and insist on ambulating instead of doing a stand pivot with a wheelchair that therapy suggest to do.

## 2014-12-18 NOTE — Progress Notes (Signed)
Occupational Therapy Note  Patient Details  Name: Richard [name] MRN: [id number] Date of Birth: 09-Aug-1945  Today's Date: 12/18/2014 OT Individual Time: 1400-1430 OT Individual Time Calculation (min): 30 min   Pt c/o sporadic pain in R hand and numbness; RN aware Individual Therapy  Pt engaged in R hand activities with focus on three finger grasp to retrieve clothes pins and place on vertical dowel.  Pt exhibited difficulty grading movements and grasps.  Pt with decreased sensation in R hand which inhibits ability to grasp with appropriate force.  Pt educated on importance of gazing at R hand when using for tasks.  Pt noted with increased control with tasks at end of session as compared to beginning of session.   Lavone Neri [facility] 12/18/2014, 2:58 PM

## 2014-12-18 NOTE — Progress Notes (Signed)
Speech Language Pathology Daily Session Note  Patient Details  Name: Richard [name] MRN: [id number] Date of Birth: 11-22-1945  Today's Date: 12/18/2014 SLP Individual Time: 1017-1100 SLP Individual Time Calculation (min): 43 min  Short Term Goals: Week 1: SLP Short Term Goal 1 (Week 1): Pt will monitor and correct verbal errors during conversations for 75% accuracy with min assist verbal cues.  SLP Short Term Goal 2 (Week 1): Pt will consume trials of regular textures with mod I use of lingual sweep and liquid wash to manage oral residue over three targeted sessions prior to diet advancement.   SLP Short Term Goal 3 (Week 1): Pt will utilize compensatory word finding strategies to make needs/wants known during conversations with min assist verbal cues.  SLP Short Term Goal 4 (Week 1): Pt will complete basic to semi-complex self care and/or home management tasks with min assist verbal cues for functional problem solving.   SLP Short Term Goal 5 (Week 1): Pt will utilize compensatory memory aids to faciliitate recall of new information with min assist verbal cues.    Skilled Therapeutic Interventions:  Pt was seen for skilled ST targeting communication goals and pt/family education.  Upon arrival, pt was seated upright in wheelchair, awake, alert, and agreeable to participate in ST.  SLP facilitated the session with skilled compensatory strategy instruction to facilitate improved functional communication with caregivers.  Pt's daughter and son were present and were provided with a handout of supported conversation techniques to maximize pt's independence for conveying his needs and wants.  SLP particularly emphasized word finding strategies such as use of description and substitution for when pt gets "stuck" on a word.  Pt returned demonstration of the abovementioned strategies with min assist verbal cues in a structured context.  SLP also facilitated the session with a categorical naming task targeting  practice of additional word finding strategies, during which pt required mod assist verbal cues to generate specific category members.  Pt was noted to become frustrated with task and needed instruction to take short breaks to prevent pt from developing negative feelings regarding his communication and to improve frustration tolerance.  Pt was returned to room and left with call bell within reach.  Continue per current plan of care.   Function:  Eating Eating                 Cognition Comprehension Comprehension assist level: Understands basic 90% of the time/cues < 10% of the time  Expression   Expression assist level: Expresses basic 75 - 89% of the time/requires cueing 10 - 24% of the time. Needs helper to occlude trach/needs to repeat words.  Social Interaction Social Interaction assist level: Interacts appropriately 75 - 89% of the time - Needs redirection for appropriate language or to initiate interaction.  Problem Solving Problem solving assist level: Solves basic 75 - 89% of the time/requires cueing 10 - 24% of the time  Memory Memory assist level: Recognizes or recalls 75 - 89% of the time/requires cueing 10 - 24% of the time    Pain Pain Assessment Pain Assessment: No/denies pain  Therapy/Group: Individual Therapy  Page, Melanee Spry 12/18/2014, 12:47 PM

## 2014-12-18 NOTE — IPOC Note (Signed)
Overall Plan of Care [facility]) Patient Details Name: Richard [name] MRN: [id number] DOB: Feb 08, 1946  Admitting Diagnosis: L ICH  Hospital Problems: Principal Problem:   ICH (intracerebral hemorrhage) Active Problems:   Type 2 diabetes mellitus with peripheral neuropathy   Essential hypertension   Ataxia, post-stroke     Functional Problem List: Nursing Bladder, Motor, Nutrition, Perception, Safety, Sensory  PT Balance, Behavior, Perception, Safety, Endurance, Sensory, Motor  OT Balance, Cognition, Endurance, Motor, Perception, Safety, Skin Integrity  SLP Nutrition, Cognition, Linguistic  TR         Basic ADL's: OT Grooming, Bathing, Dressing, Toileting     Advanced  ADL's: OT Simple Meal Preparation     Transfers: PT Bed Mobility, Bed to Chair, Car, Furniture, Civil Service fast streamer, Research scientist (life sciences): PT Ambulation, Psychologist, prison and probation services, Stairs     Additional Impairments: OT Fuctional Use of Upper Extremity  SLP Swallowing, Social Cognition, Communication expression Problem Solving, Memory, Attention, Awareness  TR      Anticipated Outcomes Item Anticipated Outcome  Self Feeding No goal  Swallowing  mod I    Basic self-care  Mod I  Toileting  Mod I   Bathroom Transfers Mod I  Bowel/Bladder  independent  Transfers  mod I  Locomotion  ambulatory with supervision in home and community  Communication  supervision   Cognition  supervision   Pain  less than 2  Safety/Judgment  modified independent   Therapy Plan: PT Intensity: Minimum of 1-2 x/day ,45 to 90 minutes PT Frequency: 5 out of 7 days PT Duration Estimated Length of Stay: 8-10 days OT Intensity: Minimum of 1-2 x/day, 45 to 90 minutes OT Frequency: 5 out of 7 days OT Duration/Estimated Length of Stay: 10-14 days SLP Intensity: Minumum of 1-2 x/day, 30 to 90 minutes SLP Frequency: 3 to 5 out of 7 days SLP Duration/Estimated Length of Stay: anticipated d/c date of 9/30        Team  Interventions: Nursing Interventions Patient/Family Education, Bladder Management, Medication Management, Cognitive Remediation/Compensation, Discharge Planning, Dysphagia/Aspiration Precaution Training  PT interventions Ambulation/gait training, Warden/ranger, DME/adaptive equipment instruction, Functional mobility training, Neuromuscular re-education, Patient/family education, Psychosocial support, Stair training, Therapeutic Exercise, UE/LE Coordination activities, Wheelchair propulsion/positioning, Visual/perceptual remediation/compensation, UE/LE Strength taining/ROM, Therapeutic Activities, Community reintegration, Disease management/prevention, Cognitive remediation/compensation, Discharge planning  OT Interventions Warden/ranger, Cognitive remediation/compensation, Community reintegration, Discharge planning, Disease mangement/prevention, Fish farm manager, Functional mobility training, Neuromuscular re-education, Patient/family education, Psychosocial support, Self Care/advanced ADL retraining, Therapeutic Activities, Therapeutic Exercise, UE/LE Strength taining/ROM, UE/LE Coordination activities, Visual/perceptual remediation/compensation  SLP Interventions Cognitive remediation/compensation, Cueing hierarchy, Functional tasks, Environmental controls, Dysphagia/aspiration precaution training, Internal/external aids, Patient/family education, Speech/Language facilitation, Multimodal communication approach  TR Interventions    SW/CM Interventions Discharge Planning, Psychosocial Support, Patient/Family Education    Team Discharge Planning: Destination: PT-Home ,OT- Home , SLP-Home Projected Follow-up: PT-Home health PT, OT-  Home health OT, Outpatient OT, 24 hour supervision/assistance (TBD), SLP-24 hour supervision/assistance, Outpatient SLP, Home Health SLP Projected Equipment Needs: PT-None recommended by PT, OT- Tub/shower bench, To be determined,  SLP-None recommended by SLP Equipment Details: PT- , OT-  Patient/family involved in discharge planning: PT- Patient, Family member/caregiver,  OT-Patient, Family member/caregiver, SLP-Patient, Family member/caregiver  MD ELOS: 8-12d Medical Rehab Prognosis:  Good Assessment: 68 y.o. 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 By report patient lives with his wife independent prior to admission. Presented 12/10/2014 with aphasia and  right sided weakness. No recent head trauma, no use of anticoagulation or antiplatelets. Systolic blood pressure greater than 200 in the ED. CT of the brain showed evidence of lobar left frontal ICH measuring 3.5 x 2.5 cm. Mild surrounding vasogenic edema but no hydrocephalus. MRI of the brain showed acute left posterior frontal intracerebral hemorrhage 30 x 24 x 32 mm favored to represent a lobar hypertensive bleed. No sign suggestive of vascular malformation or cortical venous thrombosis. Patient did require intubation for a short time due to acute respiratory failure and extubated 12/14/2014.     Now requiring 24/7 Rehab RN,MD, as well as CIR level PT, OT and SLP.  Treatment team will focus on ADLs and mobility with goals set at Supervision See Team Conference Notes for weekly updates to the plan of care

## 2014-12-19 ENCOUNTER — Inpatient Hospital Stay (HOSPITAL_COMMUNITY): Payer: Medicare HMO | Admitting: Occupational Therapy

## 2014-12-19 ENCOUNTER — Inpatient Hospital Stay (HOSPITAL_COMMUNITY): Payer: Medicare HMO | Admitting: Physical Therapy

## 2014-12-19 ENCOUNTER — Inpatient Hospital Stay (HOSPITAL_COMMUNITY): Payer: Medicare HMO | Admitting: Speech Pathology

## 2014-12-19 LAB — GLUCOSE, CAPILLARY
GLUCOSE-CAPILLARY: 177 mg/dL — AB (ref 65–99)
GLUCOSE-CAPILLARY: 191 mg/dL — AB (ref 65–99)
Glucose-Capillary: 222 mg/dL — ABNORMAL HIGH (ref 65–99)
Glucose-Capillary: 250 mg/dL — ABNORMAL HIGH (ref 65–99)

## 2014-12-19 NOTE — Progress Notes (Signed)
Occupational Therapy Session Note  Patient Details  Name: Richard [name] MRN: [id number] Date of Birth: 1946/02/23  Today's Date: 12/19/2014 OT Individual Time: 1000-1100 OT Individual Time Calculation (min): 60 min    Short Term Goals: Week 1:  OT Short Term Goal 1 (Week 1): Pt will complete bathing at sit > stand level in shower with supevision OT Short Term Goal 2 (Week 1): Pt will complete LB dressing with min assist OT Short Term Goal 3 (Week 1): Pt will complete 2 grooming tasks in standing with supervision OT Short Term Goal 4 (Week 1): Pt will complete toilet transfers with supervision with LRAD OT Short Term Goal 5 (Week 1): Pt will utilize RUE as nondominant UE during self-care tasks  Skilled Therapeutic Interventions/Progress Updates:  Upon entering the room, pt seated in wheelchair with son present during session.  Pt with c/o "tingling" in R hand but unable to give pain scale number. His son observed during this session. Pt propelled wheelchair 100' into day room with supervision and min verbal cues for safety. OT demonstrated finger opposition, digit isolation exercises, and coordination exercises. Pt returns demonstrations with difficulty requiring min verbal cues and tactile assist for proper technique x 5 reps each. Pt engaged in table top card game of "old maid" with son. Pt attempting to shuffle cards unable to perform in familiar way and therapist offering modification in which pt demonstrated. Pt passed cards out to players, played, selected, and picked up to sort cards with R hand only. Therapist holding onto L hand in order to force use of R hand during leisure tasks. Pt with multiple drops from R hand. Pt requiring min verbal cues to lock wheelchair breaks before standing and ambulating with steady assist 100' back to room without use of AD. Once in room, pt ambulating into bathroom and standing for toileting with steady assist during clothing management and hygiene. Pt  returning to sit in wheelchair at end of session with call bell within reach and family members present.   Therapy Documentation Precautions:  Precautions Precautions: Fall Restrictions Weight Bearing Restrictions: No Vital Signs: Therapy Vitals BP: 127/80 mmHg Pain: Pain Assessment Pain Assessment: No/denies pain Faces Pain Scale: Hurts even more Pain Type: Neuropathic pain Pain Location: Arm Pain Orientation: Right Pain Onset: On-going Pain Intervention(s): Rest Multiple Pain Sites: No  See Function Navigator for Current Functional Status.   Therapy/Group: Individual Therapy  Lowella Grip 12/19/2014, 12:48 PM

## 2014-12-19 NOTE — Progress Notes (Signed)
Physical Therapy Session Note  Patient Details  Name: Richard [name] MRN: [id number] Date of Birth: [birth year]  Today's Date: 12/19/2014 PT Individual Time: 4098-1191 PT Individual Time Calculation (min): 38 min   Short Term Goals: Week 1:  PT Short Term Goal 1 (Week 1): =LTG due to estimated LOS  Skilled Therapeutic Interventions/Progress Updates:   Patient sat at edge of bed with supervision to don shoes and required assistance to tie shoelaces. Patient ambulated to/from bathroom and stood to urinate with hand hygiene at sink and ambulated to/from rehab gym with min guard > close supervision. Stair training up/down sixteen 6" steps using 2 rails with supervision. Neuromuscular re-education with focus on balance strategies and weight shifting, Biodex limits of stability training without UE support with min tactile cues to facilitate ankle strategy versus hip strategy and Kinetron standing with > without UE support up to 30 sec at a time with increased resistance. Patient left sitting edge of bed with NT present.   Therapy Documentation Precautions:  Precautions Precautions: Fall Restrictions Weight Bearing Restrictions: No Pain: Pain Assessment Pain Assessment: 0-10 Pain Score: 3  Pain Type: Neuropathic pain Pain Location: Hand Pain Orientation: Right Pain Descriptors / Indicators: Burning Pain Onset: On-going Pain Intervention(s): Emotional support   See Function Navigator for Current Functional Status.   Therapy/Group: Individual Therapy  Kerney Elbe 12/19/2014, 4:54 PM

## 2014-12-19 NOTE — Progress Notes (Signed)
Physical Therapy Session Note  Patient Details  Name: Richard [name] MRN: [id number] Date of Birth: 12/30/1945  Today's Date: 12/19/2014 PT Individual Time: 1133-1203 PT Individual Time Calculation (min): 30 min   Short Term Goals: Week 1:  PT Short Term Goal 1 (Week 1): =LTG due to estimated LOS  Skilled Therapeutic Interventions/Progress Updates:    Pt received up in w/c with family present. Gait Training: See function tab for details - pt req close SBA-CGA due to variable BOS with intermittent cross over and R side environmental inattention noted req verbal cues to avoid objects on R. Neuromuscular Reeducation: PT instructs pt in high level dynamic balance skills: side stepping R/L, backwards walking, heel toe walking, and [place] R/L req up to min A HHA with LOB (typically to the R when travelling to the R). PT administers Cognitive TUG - see below for details. Pt ended up in room with nurse tech present. Continue per PT POC with focus on high level balance, safety awareness, and cognitive reeducation.   Therapy Documentation Precautions:  Precautions Precautions: Fall Restrictions Weight Bearing Restrictions: No Pain: Pain Assessment Pain Assessment: Faces Faces Pain Scale: Hurts even more Pain Type: Neuropathic pain Pain Location: Arm Pain Orientation: Right Pain Onset: On-going Pain Intervention(s): Rest Multiple Pain Sites: No  Balance: Balance Balance Assessed: Yes Standardized Balance Assessment Standardized Balance Assessment: Timed Up and Go Test Timed Up and Go Test TUG: Cognitive TUG Cognitive TUG (seconds): 26   See Function Navigator for Current Functional Status.   Therapy/Group: Individual Therapy  HYSLOP,AMANDA M 12/19/2014, 11:49 AM

## 2014-12-19 NOTE — Progress Notes (Signed)
Speech Language Pathology Daily Session Note  Patient Details  Name: Richard [name] MRN: [id number] Date of Birth: Dec 11, 1945  Today's Date: 12/19/2014 SLP Individual Time: 0802-0900 SLP Individual Time Calculation (min): 58 min  Short Term Goals: Week 1: SLP Short Term Goal 1 (Week 1): Pt will monitor and correct verbal errors during conversations for 75% accuracy with min assist verbal cues.  SLP Short Term Goal 2 (Week 1): Pt will consume trials of regular textures with mod I use of lingual sweep and liquid wash to manage oral residue over three targeted sessions prior to diet advancement.   SLP Short Term Goal 3 (Week 1): Pt will utilize compensatory word finding strategies to make needs/wants known during conversations with min assist verbal cues.  SLP Short Term Goal 4 (Week 1): Pt will complete basic to semi-complex self care and/or home management tasks with min assist verbal cues for functional problem solving.   SLP Short Term Goal 5 (Week 1): Pt will utilize compensatory memory aids to faciliitate recall of new information with min assist verbal cues.    Skilled Therapeutic Interventions:  Pt was seen for skilled ST targeting cognitive-linguistic goals and ongoing family education.   Upon arrival, pt was seated upright in bed finishing breakfast.  Pt requested to complete oral care prior to leaving room and required min assist verbal cues to recognize and correct frequent dropping of items.  Upon completion of oral care, SLP facilitated the session with a barrier task targeting use of functional communication strategies.  Pt's son arrived shortly after beginning activity and SLP encouraged him to participate in therapy with therapist and pt.  SLP reviewed and reinforced communication strategies addressed in yesterday's therapy session with both pt and pt's son.  Pt utilized compensatory strategies with mod assist verbal cues during the abovementioned tasks.  Pt's son actively participated in  task and demonstrated emerging competence in providing pt with semantic cues to facilitate word finding.  Pt was left in room in wheelchair with son at bedside.  Continue per current plan of care.    Function:  Eating Eating   Modified Consistency Diet: Yes Eating Assist Level: More than reasonable amount of time           Cognition Comprehension Comprehension assist level: Understands basic 90% of the time/cues < 10% of the time  Expression   Expression assist level: Expresses basic 75 - 89% of the time/requires cueing 10 - 24% of the time. Needs helper to occlude trach/needs to repeat words.  Social Interaction Social Interaction assist level: Interacts appropriately 75 - 89% of the time - Needs redirection for appropriate language or to initiate interaction.  Problem Solving Problem solving assist level: Solves basic 75 - 89% of the time/requires cueing 10 - 24% of the time  Memory Memory assist level: Recognizes or recalls 75 - 89% of the time/requires cueing 10 - 24% of the time    Pain Pain Assessment Pain Assessment: No/denies pain  Therapy/Group: Individual Therapy  Kaytlen Lightsey, Melanee Spry 12/19/2014, 12:22 PM

## 2014-12-20 ENCOUNTER — Inpatient Hospital Stay (HOSPITAL_COMMUNITY): Payer: Medicare HMO | Admitting: Occupational Therapy

## 2014-12-20 ENCOUNTER — Inpatient Hospital Stay (HOSPITAL_COMMUNITY): Payer: Medicare HMO | Admitting: Physical Therapy

## 2014-12-20 ENCOUNTER — Inpatient Hospital Stay (HOSPITAL_COMMUNITY): Payer: Medicare HMO | Admitting: Speech Pathology

## 2014-12-20 LAB — GLUCOSE, CAPILLARY
GLUCOSE-CAPILLARY: 240 mg/dL — AB (ref 65–99)
GLUCOSE-CAPILLARY: 200 mg/dL — AB (ref 65–99)
GLUCOSE-CAPILLARY: 200 mg/dL — AB (ref 65–99)
Glucose-Capillary: 220 mg/dL — ABNORMAL HIGH (ref 65–99)

## 2014-12-20 MED ORDER — BISACODYL 10 MG RE SUPP: 10.0000 mg | Freq: Every day | RECTAL | Status: DC | PRN

## 2014-12-20 MED ORDER — SORBITOL 70 % SOLN
60.0000 mL | Status: AC
  Filled 2014-12-20: qty 60

## 2014-12-20 MED ORDER — INSULIN GLARGINE 100 UNIT/ML ~~LOC~~ SOLN
14.0000 [IU] | Freq: Every day | SUBCUTANEOUS | Status: DC
  Administered 2014-12-20 – 2014-12-21 (×2): 14 [IU] via SUBCUTANEOUS
  Filled 2014-12-20 (×3): qty 0.14

## 2014-12-20 NOTE — Progress Notes (Signed)
Subjective/Complaints: No new issues. Up with therapy in the bathroom early today Still no bm    Pt denies N/V or abd pain, no breathing issues Objective: Vital Signs: Blood pressure 133/66, pulse 84, temperature 98.1 F (36.7 C), temperature source Oral, resp. rate 18, height 5\' 11"  (1.803 m), weight 91.3 kg (201 lb 4.5 oz), SpO2 100 %. No results found. Results for orders placed or performed during the hospital encounter of 12/16/14 (from the past 72 hour(s))  Glucose, capillary     Status: Abnormal   Collection Time: 12/17/14 11:12 AM  Result Value Ref Range   Glucose-Capillary 234 (H) 65 - 99 mg/dL   Comment 1 Notify RN   Urinalysis, Routine w reflex microscopic (not at [facility])     Status: Abnormal   Collection Time: 12/17/14  4:24 PM  Result Value Ref Range   Color, Urine YELLOW YELLOW   APPearance CLEAR CLEAR   Specific Gravity, Urine 1.020 1.005 - 1.030   pH 5.0 5.0 - 8.0   Glucose, UA >1000 (A) NEGATIVE mg/dL   Hgb urine dipstick LARGE (A) NEGATIVE   Bilirubin Urine NEGATIVE NEGATIVE   Ketones, ur NEGATIVE NEGATIVE mg/dL   Protein, ur NEGATIVE NEGATIVE mg/dL   Urobilinogen, UA 1.0 0.0 - 1.0 mg/dL   Nitrite NEGATIVE NEGATIVE   Leukocytes, UA NEGATIVE NEGATIVE  Urine culture     Status: None   Collection Time: 12/17/14  4:24 PM  Result Value Ref Range   Specimen Description URINE, CLEAN CATCH    Special Requests NONE    Culture MULTIPLE SPECIES PRESENT, SUGGEST RECOLLECTION    Report Status 12/18/2014 FINAL   Urine microscopic-add on     Status: Abnormal   Collection Time: 12/17/14  4:24 PM  Result Value Ref Range   Squamous Epithelial / LPF RARE RARE   WBC, UA 0-2 <3 WBC/hpf   RBC / HPF 11-20 <3 RBC/hpf   Casts HYALINE CASTS (A) NEGATIVE    Comment: WBC CAST  Glucose, capillary     Status: Abnormal   Collection Time: 12/17/14  5:08 PM  Result Value Ref Range   Glucose-Capillary 249 (H) 65 - 99 mg/dL   Comment 1 Notify RN   Glucose, capillary     Status:  Abnormal   Collection Time: 12/17/14  8:57 PM  Result Value Ref Range   Glucose-Capillary 202 (H) 65 - 99 mg/dL  Basic metabolic panel     Status: Abnormal   Collection Time: 12/18/14  6:05 AM  Result Value Ref Range   Sodium 134 (L) 135 - 145 mmol/L   Potassium 3.5 3.5 - 5.1 mmol/L   Chloride 96 (L) 101 - 111 mmol/L   CO2 29 22 - 32 mmol/L   Glucose, Bld 178 (H) 65 - 99 mg/dL   BUN 15 6 - 20 mg/dL   Creatinine, Ser 12/20/14 0.61 - 1.24 mg/dL   Calcium 9.3 8.9 - 3.43 mg/dL   GFR calc non Af Amer >60 >60 mL/min   GFR calc Af Amer >60 >60 mL/min    Comment: (NOTE) The eGFR has been calculated using the CKD EPI equation. This calculation has not been validated in all clinical situations. eGFR's persistently <60 mL/min signify possible Chronic Kidney Disease.    Anion gap 9 5 - 15  Glucose, capillary     Status: Abnormal   Collection Time: 12/18/14  6:42 AM  Result Value Ref Range   Glucose-Capillary 218 (H) 65 - 99 mg/dL  Glucose, capillary  Status: Abnormal   Collection Time: 12/18/14 12:31 PM  Result Value Ref Range   Glucose-Capillary 245 (H) 65 - 99 mg/dL   Comment 1 Notify RN   Glucose, capillary     Status: Abnormal   Collection Time: 12/18/14  5:21 PM  Result Value Ref Range   Glucose-Capillary 202 (H) 65 - 99 mg/dL   Comment 1 Notify RN   Glucose, capillary     Status: Abnormal   Collection Time: 12/18/14  9:01 PM  Result Value Ref Range   Glucose-Capillary 241 (H) 65 - 99 mg/dL   Comment 1 Notify RN   Glucose, capillary     Status: Abnormal   Collection Time: 12/19/14  7:06 AM  Result Value Ref Range   Glucose-Capillary 177 (H) 65 - 99 mg/dL   Comment 1 Notify RN   Glucose, capillary     Status: Abnormal   Collection Time: 12/19/14 12:07 PM  Result Value Ref Range   Glucose-Capillary 222 (H) 65 - 99 mg/dL   Comment 1 Notify RN   Glucose, capillary     Status: Abnormal   Collection Time: 12/19/14  4:47 PM  Result Value Ref Range   Glucose-Capillary 191 (H)  65 - 99 mg/dL   Comment 1 Notify RN   Glucose, capillary     Status: Abnormal   Collection Time: 12/19/14  9:10 PM  Result Value Ref Range   Glucose-Capillary 250 (H) 65 - 99 mg/dL  Glucose, capillary     Status: Abnormal   Collection Time: 12/20/14  7:04 AM  Result Value Ref Range   Glucose-Capillary 220 (H) 65 - 99 mg/dL      General: No acute distress Mood and affect are appropriate Eyes- prosthetic Left eye, 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 Heart: Regular rate and rhythm no rubs murmurs or extra sounds Lungs: Clear to auscultation, breathing unlabored, no rales or wheezes Abdomen: Positive bowel sounds, soft nontender to palpation, nondistended Extremities: No clubbing, cyanosis, or edema Skin: No evidence of breakdown, no evidence of rash GU:  Foley                  Neurologic: Cranial nerves II through XII intact, motor strength is 5/5 in bilateral deltoid, bicep, tricep, grip, hip flexor, knee extensors, ankle dorsiflexor and plantar flexor Sensory exam reduced  sensation to light touch and proprioception in right  upper and lower extremities Cerebellar exam normal finger to nose to finger as well as heel to shin in left  upper and lower extremities, moderate dysmetria on RIght Musculoskeletal: Full range of motion in all 4 extremities. No joint swelling   Assessment/Plan: 1. Functional deficits secondary to left posterior frontal intracranial hemorrhage felt to be secondary to hypertensive crisis with right sided ataxia and sensory deficits as well as aphasiawhich require 3+ hours per day of interdisciplinary therapy in a comprehensive inpatient rehab setting. Physiatrist is providing close team supervision and 24 hour management of active medical problems listed below. Physiatrist and rehab team continue to assess barriers to discharge/monitor patient progress toward functional and medical goals. FIM: Function - Bathing Bathing activity did not occur: Refused Position: Shower Body  parts bathed by patient: Right arm, Left arm, Chest, Abdomen, Front perineal area, Buttocks, Right upper leg, Left upper leg, Right lower leg, Left lower leg Body parts bathed by helper: Back Bathing not applicable: Left lower leg Assist Level: Touching or steadying assistance(Pt > 75%)  Function- Upper Body Dressing/Undressing What is the patient wearing?: Pull over  shirt/dress Pull over shirt/dress - Perfomed by patient: Thread/unthread right sleeve, Thread/unthread left sleeve, Put head through opening, Pull shirt over trunk Assist Level: Supervision or verbal cues Set up : To obtain clothing/put away Function - Lower Body Dressing/Undressing What is the patient wearing?: Pants, Socks, Shoes Position: Wheelchair/chair at sink Underwear - Performed by patient: Pull underwear up/down, Thread/unthread right underwear leg, Thread/unthread left underwear leg Underwear - Performed by helper: Thread/unthread right underwear leg, Thread/unthread left underwear leg Pants- Performed by patient: Thread/unthread right pants leg, Thread/unthread left pants leg, Pull pants up/down Pants- Performed by helper: Thread/unthread left pants leg, Thread/unthread right pants leg Socks - Performed by patient: Don/doff right sock, Don/doff left sock Shoes - Performed by patient: Don/doff right shoe, Don/doff left shoe Shoes - Performed by helper: Fasten right, Fasten left Assist Level: Touching or steadying assistance (Pt > 75%)  Function - Toileting Toileting activity did not occur: Refused Toileting steps completed by patient: Adjust clothing prior to toileting, Performs perineal hygiene, Adjust clothing after toileting Assist level: Supervision or verbal cues  Function - Toilet Transfers Toilet transfer activity did not occur:  (standing to void) Toilet transfer assistive device: Bedside commode Assist level to toilet: Supervision or verbal cues Assist level to bedside commode (at bedside): Touching or  steadying assistance (Pt > 75%) Assist level from bedside commode (at bedside): Touching or steadying assistance (Pt > 75%)  Function - Chair/bed transfer Chair/bed transfer method: Ambulatory Chair/bed transfer assist level: Touching or steadying assistance (Pt > 75%) Chair/bed transfer assistive device: Armrests Chair/bed transfer details: Verbal cues for precautions/safety  Function - Locomotion: Wheelchair Will patient use wheelchair at discharge?: No Type: Manual Max wheelchair distance: 100 Assist Level: Touching or steadying assistance (Pt > 75%) Assist Level: Touching or steadying assistance (Pt > 75%) Assist Level: Touching or steadying assistance (Pt > 75%) Turns around,maneuvers to table,bed, and toilet,negotiates 3% grade,maneuvers on rugs and over doorsills: No Function - Locomotion: Ambulation Assistive device: No device Max distance: 150 ft Assist level: Touching or steadying assistance (Pt > 75%) Assist level: Touching or steadying assistance (Pt > 75%) Assist level: Touching or steadying assistance (Pt > 75%) Assist level: Touching or steadying assistance (Pt > 75%) Assist level: Touching or steadying assistance (Pt > 75%)  Function - Comprehension Comprehension: Auditory Comprehension assist level: Understands basic 90% of the time/cues < 10% of the time  Function - Expression Expression: Verbal Expression assist level: Expresses basic 75 - 89% of the time/requires cueing 10 - 24% of the time. Needs helper to occlude trach/needs to repeat words.  Function - Social Interaction Social Interaction assist level: Interacts appropriately 75 - 89% of the time - Needs redirection for appropriate language or to initiate interaction.  Function - Problem Solving Problem solving assist level: Solves basic 75 - 89% of the time/requires cueing 10 - 24% of the time  Function - Memory Memory assist level: Recognizes or recalls 75 - 89% of the time/requires cueing 10 - 24%  of the time Patient normally able to recall (first 3 days only): That he or she is in a hospital, Current season, Location of own room, Staff names and faces Medical Problem List and Plan: 1. Functional deficits secondary to left posterior frontal intracranial hemorrhage felt to be secondary to hypertensive crisis 2.  DVT Prophylaxis/Anticoagulation: Subcutaneous heparin initiated 12/12/2014. Monitor platelet counts and any signs of bleeding 3. Pain Management: Tylenol as needed 4. Acute respiratory failure. Extubated 12/14/2014. Comfortable/no distress currently 5. Neuropsych: This patient is capable  of making decisions on his own behalf. 6. Skin/Wound Care: Routine skin checks 7. Fluids/Electrolytes/Nutrition: Routine I&O with follow-up chemistries in am 8. Hypertension. Norvasc 10 mg daily, Avapro 300 mg daily, hydrochlorothiazide 25 mg daily. Monitor with increased mobility---fair control at present 9. Diabetes mellitus of peripheral neuropathy. Hemoglobin A1c 7.5.              -CBG's still high---adjust lantus 10. History of alcohol use. Provide counseling. So far no signs of withdrawal 11. Hyperlipidemia. Lipitor 12.  Hypokalemia improved on KCL, diet 13.  Constipation-senna S bid; sorbitol, dulcolax today 14.  Urinary retention- voiding trial LOS (Days) 4 A FACE TO FACE EVALUATION WAS PERFORMED  SWARTZ,[name] 12/20/2014, 9:21 AM

## 2014-12-20 NOTE — Progress Notes (Signed)
Occupational Therapy Session Note  Patient Details  Name: Richard [name] MRN: [id number] Date of Birth: 1946/03/16  Today's Date: 12/20/2014 OT Individual Time: 1400-1430 OT Individual Time Calculation (min): 30 min    Short Term Goals: Week 1:  OT Short Term Goal 1 (Week 1): Pt will complete bathing at sit > stand level in shower with supevision OT Short Term Goal 2 (Week 1): Pt will complete LB dressing with min assist OT Short Term Goal 3 (Week 1): Pt will complete 2 grooming tasks in standing with supervision OT Short Term Goal 4 (Week 1): Pt will complete toilet transfers with supervision with LRAD OT Short Term Goal 5 (Week 1): Pt will utilize RUE as nondominant UE during self-care tasks  Skilled Therapeutic Interventions/Progress Updates:    Pt seen for OT therapy session focusing on neurmuscular re-education. Pt in w/c upon arrival, voicing fatigue having just finished PT session. Pt self propelled w/c part of way to therapy gym, however, pt with decreased control and sensation in R UE and R hand and would get caught in w/c spokes. Theraband placed around w/c rim  On R tire. Pt demonstrated some improvement with self propelling w/c, however, hand would still get caught in w/c requiring VCs for attention. In therapy gym, pt completed cup stacking task, initially completing with "squeshy" cones. Pt verbalized not being able to tell how much force/pressure he is applying to items. He then completed cup stacking task with styrofoam cups in order to have input for control. He was able to functionally grasp cups with increased time.  He then completed standing cup task while weight bearing through R UE on textured mat. Pt verbalized not being able to feel "spikes" on mat. Educated pt regarding decreased sensation and safety implications. Pt returned to room at end of session, all needs in reach.   Therapy Documentation Precautions:  Precautions Precautions: Fall Restrictions Weight Bearing  Restrictions: No Pain: Pain Assessment Pain Assessment: No/denies pain  See Function Navigator for Current Functional Status.   Therapy/Group: Individual Therapy  Lewis, Amy C 12/20/2014, 2:19 PM

## 2014-12-20 NOTE — Progress Notes (Signed)
Physical Therapy Session Note  Patient Details  Name: Richard [name] MRN: [id number] Date of Birth: [birth year]  Today's Date: 12/20/2014 PT Individual Time: 1300-1400 PT Individual Time Calculation (min): 60 min   Short Term Goals: Week 1:  PT Short Term Goal 1 (Week 1): =LTG due to estimated LOS  Skilled Therapeutic Interventions/Progress Updates:    Pt received up in w/c with family present in room - agreeable to PT, reporting some numbness in R hand. Neuromuscular Reeducation: PT instructs pt in [place] Level C balance challenge exercises: squats 2 x 10 reps, figure 8 walking 2 x 2 reps each direction, sideways walking 2 x 10 reps each direction with pt counting out loud, heel-toe walking with and without one UE support x 2 reps each, attempted single leg stance 2 x 3 reps each leg, heel walking with one UE support x 2 reps, toe walking with one UE support x 2 reps, sit to stand without hands 2 x 10 reps, and stair walking (1 flight with R rail) - req CGA-SBA as needed for safety. On second set of [place] C exercises, PT has pt self direct program with handout for cues req min-mod cues to complete exercise program correctly - Pt has one LOB during toe walking holding support with R (weak) UE req mod A to correct. Therapeutic Exercise: PT places pt on UBE in sitting position and pt instructs PT to up it to resistance L6 (progressively dropping down to L3 as pt fatigues) and pt completes 10 minutes on UBE with rest breaks as needed. Most difficult part of the activity is maintaining attention to R hand in order to continue grasping R handle. Pt's balance is improving, but pt continues to have balance deficits, especially when in a fatigued state. R side inattention and emerging learned nonuse of R hand need continued therapy. Pt ended up in w/c with family present. Continue per PT POC.   Therapy Documentation Precautions:  Precautions Precautions: Fall Restrictions Weight Bearing Restrictions:  No Pain: Pain Assessment Pain Assessment: No/denies pain   See Function Navigator for Current Functional Status.   Therapy/Group: Individual Therapy  HYSLOP,AMANDA M 12/20/2014, 1:03 PM

## 2014-12-20 NOTE — Progress Notes (Signed)
Patient impulsive on getting oob to bathroom. Explained the use of wheelchair for safety but refused to use it. Stated "I will not get on that chair, I wanna walk" while already getting out of bed. Non skid socks applied

## 2014-12-20 NOTE — Progress Notes (Signed)
Occupational Therapy Session Note  Patient Details  Name: Richard [name] MRN: [id number] Date of Birth: November 29, 1945  Today's Date: 12/20/2014 OT Individual Time: 0700-0756 OT Individual Time Calculation (min): 56 min    Short Term Goals: Week 1:  OT Short Term Goal 1 (Week 1): Pt will complete bathing at sit > stand level in shower with supevision OT Short Term Goal 2 (Week 1): Pt will complete LB dressing with min assist OT Short Term Goal 3 (Week 1): Pt will complete 2 grooming tasks in standing with supervision OT Short Term Goal 4 (Week 1): Pt will complete toilet transfers with supervision with LRAD OT Short Term Goal 5 (Week 1): Pt will utilize RUE as nondominant UE during self-care tasks  Skilled Therapeutic Interventions/Progress Updates:    Upon entering the room, pt supine in bed with no c/o pain this session. Skilled OT session with focus on ADL retraining, balance, safety awareness, and functional transfers. Pt requesting to shower this session and ambulating with use of AE and close supervision. Pt requiring verbal cues as he was observed to be "furntiture walking" into the bathroom. Pt seated in shower chair for bathing with min verbal cues to utilize R UE to assist with bathing and dressing this session. Steady assist when standing in shower to was peri area and buttocks. Pt standing at sink side for grooming tasks with increased time to open containers and supervision for balance. Pt returning to sit in wheelchair for sit <>standing for dressing tasks. Please see navigator for additional information. Pt seated in wheelchair with quick release belt donned and call bell within reach.   Therapy Documentation Precautions:  Precautions Precautions: Fall Restrictions Weight Bearing Restrictions: No General:   Vital Signs: Therapy Vitals Temp: 98.1 F (36.7 C) Temp Source: Oral Pulse Rate: 84 Resp: 18 BP: 133/66 mmHg Patient Position (if appropriate): Lying Oxygen  Therapy SpO2: 100 % O2 Device: Not Delivered  See Function Navigator for Current Functional Status.   Therapy/Group: Individual Therapy  Lowella Grip 12/20/2014, 7:59 AM

## 2014-12-20 NOTE — Progress Notes (Signed)
Speech Language Pathology Daily Session Note  Patient Details  Name: Richard [name] MRN: [id number] Date of Birth: [birth year]  Today's Date: 12/20/2014 SLP Individual Time: 4098-1191 SLP Individual Time Calculation (min): 60 min  Short Term Goals: Week 1: SLP Short Term Goal 1 (Week 1): Pt will monitor and correct verbal errors during conversations for 75% accuracy with min assist verbal cues.  SLP Short Term Goal 2 (Week 1): Pt will consume trials of regular textures with mod I use of lingual sweep and liquid wash to manage oral residue over three targeted sessions prior to diet advancement.   SLP Short Term Goal 3 (Week 1): Pt will utilize compensatory word finding strategies to make needs/wants known during conversations with min assist verbal cues.  SLP Short Term Goal 4 (Week 1): Pt will complete basic to semi-complex self care and/or home management tasks with min assist verbal cues for functional problem solving.   SLP Short Term Goal 5 (Week 1): Pt will utilize compensatory memory aids to faciliitate recall of new information with min assist verbal cues.    Skilled Therapeutic Interventions: Pt seen in therapy room for skilled ST intervention with focus on dysphagia and communication goals. Pt seated upright in chiar, 90 degrees to increase swallow safety. Clinician provided snack of regular consistency with thin liquids. Pt conusmed 1 graham cracker with no oral stasis noted and appropriate bolus manipulation time. No overt s/s of aspiration noted. Decreased word finding targeted through 4-step sequencing cards. Pt arranged cards in correct order with 100% accuracy. Pt used word finding strategies to explain what was happening in each picture, mod-max A. Ptt required 50-75% cueing to verbalize complete sentence. Written cue provided , (He is ___) to provide sentence structure. Pt noted with increase frustration during times of communication breakdown, responded well to cues.     Function:  Eating Eating   Modified Consistency Diet: Yes Eating Assist Level: More than reasonable amount of time           Cognition Comprehension Comprehension assist level: Understands basic 90% of the time/cues < 10% of the time  Expression   Expression assist level: Expresses basic 75 - 89% of the time/requires cueing 10 - 24% of the time. Needs helper to occlude trach/needs to repeat words.  Social Interaction Social Interaction assist level: Interacts appropriately 75 - 89% of the time - Needs redirection for appropriate language or to initiate interaction.  Problem Solving Problem solving assist level: Solves basic 90% of the time/requires cueing < 10% of the time  Memory Memory assist level: Recognizes or recalls 75 - 89% of the time/requires cueing 10 - 24% of the time    Pain Pain Assessment Pain Assessment: No/denies pain  Therapy/Group: Individual Therapy  McMasters, Kara Pacer 12/20/2014, 10:32 AM

## 2014-12-21 ENCOUNTER — Inpatient Hospital Stay (HOSPITAL_COMMUNITY): Payer: Medicare HMO | Admitting: Occupational Therapy

## 2014-12-21 LAB — GLUCOSE, CAPILLARY
GLUCOSE-CAPILLARY: 194 mg/dL — AB (ref 65–99)
GLUCOSE-CAPILLARY: 198 mg/dL — AB (ref 65–99)
Glucose-Capillary: 228 mg/dL — ABNORMAL HIGH (ref 65–99)
Glucose-Capillary: 169 mg/dL — ABNORMAL HIGH (ref 65–99)

## 2014-12-21 NOTE — Progress Notes (Signed)
Occupational Therapy Session Note  Patient Details  Name: Richard [name] MRN: [id number] Date of Birth: 04/16/1945  Today's Date: 12/21/2014 OT Individual Time: 0930-1015 OT Individual Time Calculation (min): 45 min    Short Term Goals: Week 1:  OT Short Term Goal 1 (Week 1): Pt will complete bathing at sit > stand level in shower with supevision OT Short Term Goal 2 (Week 1): Pt will complete LB dressing with min assist OT Short Term Goal 3 (Week 1): Pt will complete 2 grooming tasks in standing with supervision OT Short Term Goal 4 (Week 1): Pt will complete toilet transfers with supervision with LRAD OT Short Term Goal 5 (Week 1): Pt will utilize RUE as nondominant UE during self-care tasks  Skilled Therapeutic Interventions/Progress Updates:    Treatment session with focus on Rt attention, RUE NMR, and increased independence with self-care tasks.  Pt declined bathing and dressing this session, although reports difficulty with fastening shoes.  Pt attempted to tie shoes but due to impaired proprioception and sensation pt unable to tie shoes at this time.  Issued shoe buttons with pt able to return demonstrate technique.  Pt propelled w/c to therapy gym with min cues to visually attend to Rt hand as often times hand would drift towards spokes without pt's awareness.  Engaged in RUE NMR with sorting various sized objects from basket, encouraging use of vision to compensate for decreased sensation.  Utilized peg board with pt able to complete large pegs but with increased difficulty with medium sized pegs (with and without texture).  Discussed safety with impaired sensation and awareness on RUE with self-care tasks and home making tasks.  Pt ambulated back to room while pushing w/c and left upright in w/c with daughters present.  Therapy Documentation Precautions:  Precautions Precautions: Fall Restrictions Weight Bearing Restrictions: No General:   Vital Signs: Therapy Vitals BP: 138/82  mmHg Pain: Pain Assessment Pain Assessment: No/denies pain  See Function Navigator for Current Functional Status.   Therapy/Group: Individual Therapy  Rosalio Loud 12/21/2014, 12:06 PM

## 2014-12-21 NOTE — Progress Notes (Signed)
Subjective/Complaints: No new complaints. Denies pain. Had bm.   ROS limited by language     Objective: Vital Signs: Blood pressure 138/82, pulse 91, temperature 98.4 F (36.9 C), temperature source Oral, resp. rate 18, height  (1.803 m), weight 91.3 kg (201 lb 4.5 oz), SpO2 99 %. No results found. Results for orders placed or performed during the hospital encounter of 12/16/14 (from the past 72 hour(s))  Glucose, capillary     Status: Abnormal   Collection Time: 12/18/14 12:31 PM  Result Value Ref Range   Glucose-Capillary 245 (H) 65 - 99 mg/dL   Comment 1 Notify RN   Glucose, capillary     Status: Abnormal   Collection Time: 12/18/14  5:21 PM  Result Value Ref Range   Glucose-Capillary 202 (H) 65 - 99 mg/dL   Comment 1 Notify RN   Glucose, capillary     Status: Abnormal   Collection Time: 12/18/14  9:01 PM  Result Value Ref Range   Glucose-Capillary 241 (H) 65 - 99 mg/dL   Comment 1 Notify RN   Glucose, capillary     Status: Abnormal   Collection Time: 12/19/14  7:06 AM  Result Value Ref Range   Glucose-Capillary 177 (H) 65 - 99 mg/dL   Comment 1 Notify RN   Glucose, capillary     Status: Abnormal   Collection Time: 12/19/14 12:07 PM  Result Value Ref Range   Glucose-Capillary 222 (H) 65 - 99 mg/dL   Comment 1 Notify RN   Glucose, capillary     Status: Abnormal   Collection Time: 12/19/14  4:47 PM  Result Value Ref Range   Glucose-Capillary 191 (H) 65 - 99 mg/dL   Comment 1 Notify RN   Glucose, capillary     Status: Abnormal   Collection Time: 12/19/14  9:10 PM  Result Value Ref Range   Glucose-Capillary 250 (H) 65 - 99 mg/dL  Glucose, capillary     Status: Abnormal   Collection Time: 12/20/14  7:04 AM  Result Value Ref Range   Glucose-Capillary 220 (H) 65 - 99 mg/dL  Glucose, capillary     Status: Abnormal   Collection Time: 12/20/14 11:36 AM  Result Value Ref Range   Glucose-Capillary 240 (H) 65 - 99 mg/dL   Comment 1 Notify RN   Glucose,  capillary     Status: Abnormal   Collection Time: 12/20/14  4:47 PM  Result Value Ref Range   Glucose-Capillary 200 (H) 65 - 99 mg/dL   Comment 1 Notify RN   Glucose, capillary     Status: Abnormal   Collection Time: 12/20/14  9:26 PM  Result Value Ref Range   Glucose-Capillary 200 (H) 65 - 99 mg/dL   Comment 1 Notify RN   Glucose, capillary     Status: Abnormal   Collection Time: 12/21/14  6:34 AM  Result Value Ref Range   Glucose-Capillary 228 (H) 65 - 99 mg/dL   Comment 1 Notify RN       General: No acute distress Mood and affect are appropriate Eyes- prosthetic Left eye, 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 motor strength is 5/5 in bilateral deltoid, bicep, tricep, grip, hip flexor, knee extensors, ankle dorsiflexor and plantar flexor Sensory exam reduced  sensation to light touch and proprioception in right  upper and lower extremities Cerebellar exam normal finger to nose to finger as well as heel to shin in left  upper and lower extremities, moderate dysmetria on RIght Musculoskeletal: Full range of motion in all 4 extremities. No joint swelling   Assessment/Plan: 1. Functional deficits secondary to left posterior frontal intracranial hemorrhage felt to be secondary to hypertensive crisis with right sided ataxia and sensory deficits as well as aphasiawhich require 3+ hours per day of interdisciplinary therapy in a comprehensive inpatient rehab setting. Physiatrist is providing close team supervision and 24 hour management of active medical problems listed below. Physiatrist  and rehab team continue to assess barriers to discharge/monitor patient progress toward functional and medical goals. FIM: Function - Bathing Bathing activity did not occur: Refused Position: Shower Body parts bathed by patient: Right arm, Left arm, Chest, Abdomen, Front perineal area, Buttocks, Right upper leg, Left upper leg, Right lower leg, Left lower leg Body parts bathed by helper: Back Bathing not applicable: Left lower leg Assist Level: Touching or steadying assistance(Pt > 75%)  Function- Upper Body Dressing/Undressing What is the patient wearing?: Pull over shirt/dress Pull over shirt/dress - Perfomed by patient: Thread/unthread right sleeve, Thread/unthread left sleeve, Put head through opening, Pull shirt over trunk Assist Level: Supervision or verbal cues Set up : To obtain clothing/put away Function - Lower Body Dressing/Undressing What is the patient wearing?: Pants, Socks, Shoes Position: Wheelchair/chair at sink Underwear - Performed by patient: Pull underwear up/down, Thread/unthread right underwear leg, Thread/unthread left underwear leg Underwear - Performed by helper: Thread/unthread right underwear leg, Thread/unthread left underwear leg Pants- Performed by patient: Thread/unthread right pants leg, Thread/unthread left pants leg, Pull pants up/down Pants- Performed by helper: Thread/unthread left pants leg, Thread/unthread right pants leg Socks - Performed by patient: Don/doff right sock, Don/doff left sock Shoes - Performed by patient: Don/doff right shoe, Don/doff left shoe Shoes - Performed by helper: Fasten right, Fasten left Assist Level: Touching or steadying assistance (Pt > 75%)  Function - Toileting Toileting activity did not occur: Refused Toileting steps completed by patient: Adjust clothing prior to toileting, Performs perineal hygiene, Adjust clothing after toileting Assist level: Supervision or verbal cues  Function - Toilet Transfers Toilet  transfer activity did not occur:  (standing to void) Toilet transfer assistive device: Bedside commode Assist level to toilet: Supervision or verbal cues Assist level to bedside commode (at bedside): Touching or steadying assistance (Pt > 75%) Assist level from bedside commode (at bedside): Touching or steadying assistance (Pt > 75%)  Function - Chair/bed transfer Chair/bed transfer method: Ambulatory Chair/bed transfer assist level: Supervision or verbal cues Chair/bed transfer assistive device: Armrests Chair/bed transfer details: Verbal cues for precautions/safety (R side attention)  Function - Locomotion: Wheelchair Will patient use wheelchair at discharge?: No Type: Manual Max wheelchair distance: 100 Assist Level: Touching or steadying assistance (Pt > 75%) Assist Level: Touching or steadying assistance (Pt > 75%) Assist Level: Touching or steadying assistance (Pt > 75%) Turns around,maneuvers to table,bed, and toilet,negotiates 3% grade,maneuvers on rugs and over doorsills: No Function - Locomotion: Ambulation Assistive device: No device Max distance: 150' x 2 reps Assist level: Supervision or verbal cues Assist level: Supervision or verbal cues Assist level: Supervision or verbal cues Assist level: Supervision or  verbal cues Assist level: Touching or steadying assistance (Pt > 75%)  Function - Comprehension Comprehension: Auditory Comprehension assist level: Understands basic 90% of the time/cues < 10% of the time  Function - Expression Expression: Verbal Expression assist level: Expresses basic 75 - 89% of the time/requires cueing 10 - 24% of the time. Needs helper to occlude trach/needs to repeat words.  Function - Social Interaction Social Interaction assist level: Interacts appropriately with others with medication or extra time (anti-anxiety, antidepressant).  Function - Problem Solving Problem solving assist level: Solves basic 90% of the time/requires cueing <  10% of the time  Function - Memory Memory assist level: Recognizes or recalls 75 - 89% of the time/requires cueing 10 - 24% of the time Patient normally able to recall (first 3 days only): That he or she is in a hospital, Current season, Location of own room, Staff names and faces Medical Problem List and Plan: 1. Functional deficits secondary to left posterior frontal intracranial hemorrhage felt to be secondary to hypertensive crisis 2.  DVT Prophylaxis/Anticoagulation: Subcutaneous heparin initiated 12/12/2014. Monitor platelet counts and any signs of bleeding 3. Pain Management: Tylenol as needed 4. Acute respiratory failure. Extubated 12/14/2014. Comfortable/no distress currently 5. Neuropsych: This patient is capable of making decisions on his own behalf. 6. Skin/Wound Care: Routine skin checks 7. Fluids/Electrolytes/Nutrition: Routine I&O with follow-up chemistries in am 8. Hypertension. Norvasc 10 mg daily, Avapro 300 mg daily, hydrochlorothiazide 25 mg daily. Monitor with increased mobility---fair control at present 9. Diabetes mellitus of peripheral neuropathy. Hemoglobin A1c 7.5.              -CBG's still high---adjusted lantus--observe for response 10. History of alcohol use. Provide counseling. So far no signs of withdrawal 11. Hyperlipidemia. Lipitor 12.  Hypokalemia improved on KCL, diet 13.  Constipation-senna S bid; sorbitol, dulcolax today 14.  Urinary retention- voiding trial LOS (Days) 5 A FACE TO FACE EVALUATION WAS PERFORMED  SWARTZ,[name] 12/21/2014, 8:41 AM

## 2014-12-22 ENCOUNTER — Inpatient Hospital Stay (HOSPITAL_COMMUNITY): Payer: Medicare HMO

## 2014-12-22 ENCOUNTER — Inpatient Hospital Stay (HOSPITAL_COMMUNITY): Payer: Medicare HMO | Admitting: Speech Pathology

## 2014-12-22 ENCOUNTER — Inpatient Hospital Stay (HOSPITAL_COMMUNITY): Payer: Medicare HMO | Admitting: Occupational Therapy

## 2014-12-22 DIAGNOSIS — I69320 Aphasia following cerebral infarction: Secondary | ICD-10-CM

## 2014-12-22 LAB — GLUCOSE, CAPILLARY
GLUCOSE-CAPILLARY: 195 mg/dL — AB (ref 65–99)
GLUCOSE-CAPILLARY: 272 mg/dL — AB (ref 65–99)
GLUCOSE-CAPILLARY: 177 mg/dL — AB (ref 65–99)
GLUCOSE-CAPILLARY: 262 mg/dL — AB (ref 65–99)

## 2014-12-22 MED ORDER — ENOXAPARIN SODIUM 40 MG/0.4ML ~~LOC~~ SOLN
40.0000 mg | SUBCUTANEOUS | Status: DC
  Administered 2014-12-22 – 2014-12-25 (×4): 40 mg via SUBCUTANEOUS
  Filled 2014-12-22 (×4): qty 0.4

## 2014-12-22 MED ORDER — INSULIN GLARGINE 100 UNIT/ML ~~LOC~~ SOLN
19.0000 [IU] | Freq: Every day | SUBCUTANEOUS | Status: DC
  Administered 2014-12-22: 19 [IU] via SUBCUTANEOUS
  Filled 2014-12-22: qty 0.19

## 2014-12-22 NOTE — Progress Notes (Signed)
Physical Therapy Session Note  Patient Details  Name: Richard MozeNikolai [id number] Date of Birth: Dec 16, 1945  Today's Date: 12/22/2014 PT Individual Time: 1500-1527 PT Individual Time Calculation (min): 27 min   Short Term Goals: Week 1:  PT Short Term Goal 1 (Week 1): =LTG due to estimated LOS  Skilled Therapeutic Interventions/Progress Updates:    Patient in recliner, performed sit<>stand S and ambulated to day room about 220' with supervision and occasional cues for right foot clearance and right sided obstacles.  Patient participated in golf putting in standing reaching to floor and standing to putt balls with supervision to minguard assist for balance, safety and monitoring stepping around balls safely.  Patient ambulated back to room supervision and left up in recliner all needs in reach per pt request.  Therapy Documentation Precautions:  Precautions Precautions: Fall Restrictions Weight Bearing Restrictions: No Pain: Pain Assessmen Faces Pain Scale: Hurts little more Pain Type: Acute pain Pain Location: Head Pain Orientation: Posterior Pain Descriptors / Indicators: Headache Pain Frequency: Intermittent Pain Onset: Gradual Pain Intervention(s): Repositioned;Rest   See Function Navigator for Current Functional Status.   Therapy/Group: Individual Therapy  Rudi Coco [place], [place] [phone] 12/22/2014  12/22/2014, 4:35 PM

## 2014-12-22 NOTE — Progress Notes (Signed)
Occupational Therapy Session Note  Patient Details  Name: Richard [name] MRN: [id number] Date of Birth: 1945-12-19  Today's Date: 12/22/2014 OT Individual Time: 1110-1205 OT Individual Time Calculation (min): 55 min    Short Term Goals: Week 1:  OT Short Term Goal 1 (Week 1): Pt will complete bathing at sit > stand level in shower with supevision OT Short Term Goal 2 (Week 1): Pt will complete LB dressing with min assist OT Short Term Goal 3 (Week 1): Pt will complete 2 grooming tasks in standing with supervision OT Short Term Goal 4 (Week 1): Pt will complete toilet transfers with supervision with LRAD OT Short Term Goal 5 (Week 1): Pt will utilize RUE as nondominant UE during self-care tasks  Skilled Therapeutic Interventions/Progress Updates:    Treatment session with focus on Rt attention, functional use of RUE, and safety with mobility.  Pt declined bathing and dressing this session.  Ambulated to toilet with supervision, with cues for safety and attention to Rt environment due to quick movements.  Pt completed toileting in standing with distant supervision.  Ambulated to DayRoom > 300 feet with supervision.  Engaged in card matching task, "Spot it" with focus on use of RUE and scanning to Rt to identify matching items in field of 7-8.  Noted occasional overshooting when reaching with Rt hand, also decreased motor control when stacking cards most likely due to impaired sensation and proprioception.  Encouraged use of vision to compensate for impairments.  Pt returned to room as above and left seated with lunch and all needs in reach.  Therapy Documentation Precautions:  Precautions Precautions: Fall Restrictions Weight Bearing Restrictions: No General:   Vital Signs: Therapy Vitals Pulse Rate: (!) 115 BP: 124/62 mmHg Oxygen Therapy SpO2: 97 % Pain: Pain Assessment Pain Score: 6  Pain Type: Acute pain Pain Location: Hand Pain Orientation: Right Pain Descriptors / Indicators:  Pins and needles Pain Intervention(s): Ambulation/increased activity  See Function Navigator for Current Functional Status.   Therapy/Group: Individual Therapy  Rosalio Loud 12/22/2014, 12:29 PM

## 2014-12-22 NOTE — Progress Notes (Signed)
Speech Language Pathology Daily Session Note  Patient Details  Name: Richard [name] MRN: [id number] Date of Birth: September 20, 1945  Today's Date: 12/22/2014 SLP Individual Time: 7829-5621 SLP Individual Time Calculation (min): 30 min  Short Term Goals: Week 1: SLP Short Term Goal 1 (Week 1): Pt will monitor and correct verbal errors during conversations for 75% accuracy with min assist verbal cues.  SLP Short Term Goal 2 (Week 1): Pt will consume trials of regular textures with mod I use of lingual sweep and liquid wash to manage oral residue over three targeted sessions prior to diet advancement.   SLP Short Term Goal 3 (Week 1): Pt will utilize compensatory word finding strategies to make needs/wants known during conversations with min assist verbal cues.  SLP Short Term Goal 4 (Week 1): Pt will complete basic to semi-complex self care and/or home management tasks with min assist verbal cues for functional problem solving.   SLP Short Term Goal 5 (Week 1): Pt will utilize compensatory memory aids to faciliitate recall of new information with min assist verbal cues.    Skilled Therapeutic Interventions: Skilled treatment session focused on addressing use of word finding strategies. SLP facilitated session with Mod question cues to recall description and visualization as strategies word finding. Patient completed a structured generative naming task with 30% accuracy and required Max multimodal cues for use of strategies during moments of anomia.  SLP provided semantic and phonemic cues increase accuracy to 95%.  Continue with current plan of care.  Of note, patient declined PO trials for regular textures today due to not liking crackers.  Recommend SLP provide alternatives during next visit.   Function:  Eating Eating                 Cognition Comprehension Comprehension assist level: Follows basic conversation/direction with extra time/assistive device  Expression   Expression assist  level: Expresses basic 50 - 74% of the time/requires cueing 25 - 49% of the time. Needs to repeat parts of sentences.  Social Interaction Social Interaction assist level: Interacts appropriately with others with medication or extra time (anti-anxiety, antidepressant).  Problem Solving Problem solving assist level: Solves basic 90% of the time/requires cueing < 10% of the time  Memory Memory assist level: Recognizes or recalls 75 - 89% of the time/requires cueing 10 - 24% of the time    Pain Pain Assessment Pain Assessment: No/denies pain  Therapy/Group: Individual Therapy  Charlane Ferretti., CCC-SLP [phone]  BOWIE,MELISSA 12/22/2014, 12:40 PM

## 2014-12-22 NOTE — Progress Notes (Signed)
Physical Therapy Session Note  Patient Details  Name: Richard [name] MRN: [id number] Date of Birth: 08-04-1945  Today's Date: 12/22/2014 PT Individual Time: 0830-0958 PT Individual Time Calculation (min): 88 min   Short Term Goals: Week 1:  PT Short Term Goal 1 (Week 1): =LTG due to estimated LOS  Skilled Therapeutic Interventions/Progress Updates:    Patient seen for 1:1 session with focus of treatment on functional balance, right LE strength, proprioception and coordination.  Performed in room prep for session obtaining shirt from drawer, donning socks and shoes and shirt, standing to comb hair, sitting in wheelchair to brush teeth all at supervision level.  Patient ambulated in hallway supervision cues for right side awareness of obstacles to therapy gym.  In parallel bars for alternating lunges to BOSU with UE support and static balance on BOSU with decreased UE support and min assist.  In floor ladder work on weight shift, step length and foot clearance with forward, sideways movement and heel and toe walking.  Coordination activities stepping in and out fowrwards and to sides with minguard assist.  Patient practicing turns with figure of "8" around cones close supervision occ minguard around to right and single limb stance activities with tapping cones and stepping over and back cones with min assist.  Negotiated 20 stairs with minguard to supervision assist step through pattern with on railing.  Patient ambulated x 500' over level tile as well as inclined pavement and grassy surfaces with minguard to supervision assist, cues for right step clearance over unlevel surfaces.  Sat for breaks while pt working on communication with increased time required for word finding.  Left in room in recliner all needs in reach.  Therapy Documentation Precautions:  Precautions Precautions: Fall Restrictions Weight Bearing Restrictions: No General:   Vital Signs: Therapy Vitals Temp: 98.4 F (36.9  C) Temp Source: Oral Pulse Rate: (!) 115 Resp: 18 BP: 124/62 mmHg Patient Position (if appropriate): Lying Oxygen Therapy SpO2: 97 % O2 Device: Not Delivered Pain: Pain Assessment Pain Score: 6  Pain Type: Acute pain Pain Location: Hand Pain Orientation: Right Pain Descriptors / Indicators: Pins and needles Pain Intervention(s): Ambulation/increased activity   See Function Navigator for Current Functional Status.   Therapy/Group: Individual Therapy  Rudi Coco [place], [place] [phone] 12/22/2014  12/22/2014, 9:02 AM

## 2014-12-22 NOTE — Progress Notes (Signed)
Subjective/Complaints: Blood sugars too high, no other c/os  ROS limited by language     Objective: Vital Signs: Blood pressure 118/74, pulse 85, temperature 98.4 F (36.9 C), temperature source Oral, resp. rate 18, height  (1.803 m), weight 91.3 kg (201 lb 4.5 oz), SpO2 99 %. No results found. Results for orders placed or performed during the hospital encounter of 12/16/14 (from the past 72 hour(s))  Glucose, capillary     Status: Abnormal   Collection Time: 12/19/14 12:07 PM  Result Value Ref Range   Glucose-Capillary 222 (H) 65 - 99 mg/dL   Comment 1 Notify RN   Glucose, capillary     Status: Abnormal   Collection Time: 12/19/14  4:47 PM  Result Value Ref Range   Glucose-Capillary 191 (H) 65 - 99 mg/dL   Comment 1 Notify RN   Glucose, capillary     Status: Abnormal   Collection Time: 12/19/14  9:10 PM  Result Value Ref Range   Glucose-Capillary 250 (H) 65 - 99 mg/dL  Glucose, capillary     Status: Abnormal   Collection Time: 12/20/14  7:04 AM  Result Value Ref Range   Glucose-Capillary 220 (H) 65 - 99 mg/dL  Glucose, capillary     Status: Abnormal   Collection Time: 12/20/14 11:36 AM  Result Value Ref Range   Glucose-Capillary 240 (H) 65 - 99 mg/dL   Comment 1 Notify RN   Glucose, capillary     Status: Abnormal   Collection Time: 12/20/14  4:47 PM  Result Value Ref Range   Glucose-Capillary 200 (H) 65 - 99 mg/dL   Comment 1 Notify RN   Glucose, capillary     Status: Abnormal   Collection Time: 12/20/14  9:26 PM  Result Value Ref Range   Glucose-Capillary 200 (H) 65 - 99 mg/dL   Comment 1 Notify RN   Glucose, capillary     Status: Abnormal   Collection Time: 12/21/14  6:34 AM  Result Value Ref Range   Glucose-Capillary 228 (H) 65 - 99 mg/dL   Comment 1 Notify RN   Glucose, capillary     Status: Abnormal   Collection Time: 12/21/14 11:53 AM  Result Value Ref Range   Glucose-Capillary 194 (H) 65 - 99 mg/dL  Glucose, capillary     Status: Abnormal    Collection Time: 12/21/14  4:33 PM  Result Value Ref Range   Glucose-Capillary 169 (H) 65 - 99 mg/dL   Comment 1 Notify RN   Glucose, capillary     Status: Abnormal   Collection Time: 12/21/14  8:52 PM  Result Value Ref Range   Glucose-Capillary 198 (H) 65 - 99 mg/dL  Glucose, capillary     Status: Abnormal   Collection Time: 12/22/14  6:40 AM  Result Value Ref Range   Glucose-Capillary 195 (H) 65 - 99 mg/dL      General: No acute distress Mood and affect are appropriate Eyes- prosthetic Left eye, 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 motor strength is 5/5 in bilateral deltoid, bicep,  tricep, grip, hip flexor, knee extensors, ankle dorsiflexor and plantar flexor Sensory exam reduced  sensation to light touch and proprioception in right  upper and lower extremities Cerebellar exam normal finger to nose to finger as well as heel to shin in left  upper and lower extremities, moderate dysmetria on RIght Musculoskeletal: Full range of motion in all 4 extremities. No joint swelling   Assessment/Plan: 1. Functional deficits secondary to left posterior frontal intracranial hemorrhage felt to be secondary to hypertensive crisis with right sided ataxia and sensory deficits as well as aphasiawhich require 3+ hours per day of interdisciplinary therapy in a comprehensive inpatient rehab setting. Physiatrist is providing close team supervision and 24 hour management of active medical problems listed below. Physiatrist and rehab team continue to assess barriers to discharge/monitor patient progress  toward functional and medical goals. FIM: Function - Bathing Bathing activity did not occur: Refused Position: Shower Body parts bathed by patient: Right arm, Left arm, Chest, Abdomen, Front perineal area, Buttocks, Right upper leg, Left upper leg, Right lower leg, Left lower leg Body parts bathed by helper: Back Bathing not applicable: Left lower leg Assist Level: Touching or steadying assistance(Pt > 75%)  Function- Upper Body Dressing/Undressing What is the patient wearing?: Pull over shirt/dress Pull over shirt/dress - Perfomed by patient: Thread/unthread right sleeve, Thread/unthread left sleeve, Put head through opening, Pull shirt over trunk Assist Level: Supervision or verbal cues Set up : To obtain clothing/put away Function - Lower Body Dressing/Undressing What is the patient wearing?: Pants, Socks, Shoes Position: Wheelchair/chair at sink Underwear - Performed by patient: Pull underwear up/down, Thread/unthread right underwear leg, Thread/unthread left underwear leg Underwear - Performed by helper: Thread/unthread right underwear leg, Thread/unthread left underwear leg Pants- Performed by patient: Thread/unthread right pants leg, Thread/unthread left pants leg, Pull pants up/down Pants- Performed by helper: Thread/unthread left pants leg, Thread/unthread right pants leg Socks - Performed by patient: Don/doff right sock, Don/doff left sock Shoes - Performed by patient: Don/doff right shoe, Don/doff left shoe, Fasten right, Fasten left (issued shoe buttons) Shoes - Performed by helper: Fasten right, Fasten left Assist Level: Touching or steadying assistance (Pt > 75%)  Function - Toileting Toileting activity did not occur: Refused Toileting steps completed by patient: Adjust clothing prior to toileting, Performs perineal hygiene, Adjust clothing after toileting Assist level: Supervision or verbal cues  Function - Toilet Transfers Toilet transfer activity did not occur:   (standing to void) Toilet transfer assistive device: Bedside commode Assist level to toilet: Supervision or verbal cues Assist level to bedside commode (at bedside): Touching or steadying assistance (Pt > 75%) Assist level from bedside commode (at bedside): Touching or steadying assistance (Pt > 75%)  Function - Chair/bed transfer Chair/bed transfer method: Ambulatory, Stand pivot Chair/bed transfer assist level: Supervision or verbal cues Chair/bed transfer assistive device: Armrests Chair/bed transfer details: Verbal cues for precautions/safety (attention to Rt side)  Function - Locomotion: Wheelchair Will patient use wheelchair at discharge?: No Type: Manual Max wheelchair distance: 100 Assist Level: Touching or steadying assistance (Pt > 75%) Assist Level: Touching or steadying assistance (Pt > 75%) Assist Level: Touching or steadying assistance (Pt > 75%) Turns around,maneuvers to table,bed, and toilet,negotiates 3% grade,maneuvers on rugs and over doorsills: No Function - Locomotion: Ambulation Assistive device: No device Max distance: 150' x 2 reps Assist level: Supervision or verbal cues Assist level: Supervision or verbal cues Assist level: Supervision or verbal cues Assist level: Supervision or verbal cues Assist level: Touching or steadying assistance (Pt >  75%)  Function - Comprehension Comprehension: Auditory Comprehension assist level: Understands complex 90% of the time/cues 10% of the time  Function - Expression Expression: Verbal Expression assist level: Expresses basic 75 - 89% of the time/requires cueing 10 - 24% of the time. Needs helper to occlude trach/needs to repeat words.  Function - Social Interaction Social Interaction assist level: Interacts appropriately with others with medication or extra time (anti-anxiety, antidepressant).  Function - Problem Solving Problem solving assist level: Solves complex 90% of the time/cues < 10% of the  time  Function - Memory Memory assist level: Recognizes or recalls 75 - 89% of the time/requires cueing 10 - 24% of the time Patient normally able to recall (first 3 days only): That he or she is in a hospital, Current season, Location of own room, Staff names and faces Medical Problem List and Plan: 1. Functional deficits secondary to left posterior frontal intracranial hemorrhage felt to be secondary to hypertensive crisis 2.  DVT Prophylaxis/Anticoagulation: Subcutaneous heparin initiated 12/12/2014. 3 sticks per day , now renal failure, will change to lovenox 3. Pain Management: Tylenol as needed 4. Acute respiratory failure. Extubated 12/14/2014. Comfortable/no distress currently 5. Neuropsych: This patient is capable of making decisions on his own behalf. 6. Skin/Wound Care: Routine skin checks 7. Fluids/Electrolytes/Nutrition: Routine I&O with follow-up chemistries in am 8. Hypertension. Norvasc 10 mg daily, Avapro 300 mg daily, hydrochlorothiazide 25 mg daily. Monitor with increased mobility---fair control at present 9. Diabetes mellitus of peripheral neuropathy. Hemoglobin A1c 7.5.              -CBG's still high---adjust lantus again 10. History of alcohol use. Provide counseling. So far no signs of withdrawal 11. Hyperlipidemia. Lipitor 12.  Hypokalemia improved on KCL, diet 13.  Constipation-senna S bid; resolved 14.  Urinary retention-resolved LOS (Days) 6 A FACE TO FACE EVALUATION WAS PERFORMED  KIRSTEINS,ANDREW E 12/22/2014, 7:50 AM

## 2014-12-23 ENCOUNTER — Inpatient Hospital Stay (HOSPITAL_COMMUNITY): Payer: Medicare HMO | Admitting: *Deleted

## 2014-12-23 ENCOUNTER — Inpatient Hospital Stay (HOSPITAL_COMMUNITY): Payer: Medicare HMO | Admitting: Speech Pathology

## 2014-12-23 ENCOUNTER — Inpatient Hospital Stay (HOSPITAL_COMMUNITY): Payer: Medicare HMO | Admitting: Occupational Therapy

## 2014-12-23 DIAGNOSIS — R209 Unspecified disturbances of skin sensation: Secondary | ICD-10-CM

## 2014-12-23 DIAGNOSIS — I69398 Other sequelae of cerebral infarction: Secondary | ICD-10-CM

## 2014-12-23 DIAGNOSIS — R208 Other disturbances of skin sensation: Secondary | ICD-10-CM

## 2014-12-23 LAB — GLUCOSE, CAPILLARY
GLUCOSE-CAPILLARY: 172 mg/dL — AB (ref 65–99)
Glucose-Capillary: 193 mg/dL — ABNORMAL HIGH (ref 65–99)
Glucose-Capillary: 296 mg/dL — ABNORMAL HIGH (ref 65–99)
Glucose-Capillary: 209 mg/dL — ABNORMAL HIGH (ref 65–99)

## 2014-12-23 MED ORDER — INSULIN GLARGINE 100 UNIT/ML ~~LOC~~ SOLN
24.0000 [IU] | Freq: Every day | SUBCUTANEOUS | Status: DC
  Administered 2014-12-23 – 2014-12-24 (×2): 24 [IU] via SUBCUTANEOUS
  Filled 2014-12-23 (×3): qty 0.24

## 2014-12-23 NOTE — Progress Notes (Signed)
Subjective/Complaints: Did stairs with PT yesterday Discussed with OT, good RUE strength but use limited by poor sensation ROS limited by language     Objective: Vital Signs: Blood pressure 121/80, pulse 85, temperature 99 F (37.2 C), temperature source Oral, resp. rate 18, height  (1.803 m), weight 91.3 kg (201 lb 4.5 oz), SpO2 97 %. No results found. Results for orders placed or performed during the hospital encounter of 12/16/14 (from the past 72 hour(s))  Glucose, capillary     Status: Abnormal   Collection Time: 12/20/14 11:36 AM  Result Value Ref Range   Glucose-Capillary 240 (H) 65 - 99 mg/dL   Comment 1 Notify RN   Glucose, capillary     Status: Abnormal   Collection Time: 12/20/14  4:47 PM  Result Value Ref Range   Glucose-Capillary 200 (H) 65 - 99 mg/dL   Comment 1 Notify RN   Glucose, capillary     Status: Abnormal   Collection Time: 12/20/14  9:26 PM  Result Value Ref Range   Glucose-Capillary 200 (H) 65 - 99 mg/dL   Comment 1 Notify RN   Glucose, capillary     Status: Abnormal   Collection Time: 12/21/14  6:34 AM  Result Value Ref Range   Glucose-Capillary 228 (H) 65 - 99 mg/dL   Comment 1 Notify RN   Glucose, capillary     Status: Abnormal   Collection Time: 12/21/14 11:53 AM  Result Value Ref Range   Glucose-Capillary 194 (H) 65 - 99 mg/dL  Glucose, capillary     Status: Abnormal   Collection Time: 12/21/14  4:33 PM  Result Value Ref Range   Glucose-Capillary 169 (H) 65 - 99 mg/dL   Comment 1 Notify RN   Glucose, capillary     Status: Abnormal   Collection Time: 12/21/14  8:52 PM  Result Value Ref Range   Glucose-Capillary 198 (H) 65 - 99 mg/dL  Glucose, capillary     Status: Abnormal   Collection Time: 12/22/14  6:40 AM  Result Value Ref Range   Glucose-Capillary 195 (H) 65 - 99 mg/dL  Glucose, capillary     Status: Abnormal   Collection Time: 12/22/14 11:04 AM  Result Value Ref Range   Glucose-Capillary 272 (H) 65 - 99 mg/dL  Glucose,  capillary     Status: Abnormal   Collection Time: 12/22/14  4:15 PM  Result Value Ref Range   Glucose-Capillary 177 (H) 65 - 99 mg/dL  Glucose, capillary     Status: Abnormal   Collection Time: 12/22/14  8:27 PM  Result Value Ref Range   Glucose-Capillary 262 (H) 65 - 99 mg/dL  Glucose, capillary     Status: Abnormal   Collection Time: 12/23/14  6:43 AM  Result Value Ref Range   Glucose-Capillary 193 (H) 65 - 99 mg/dL      General: No acute distress Mood and affect are appropriate Eyes- prosthetic Left eye, 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 motor strength is 5/5 in bilateral deltoid, bicep, tricep, grip, hip flexor, knee  extensors, ankle dorsiflexor and plantar flexor Sensory exam reduced  sensation to light touch and proprioception in right  upper and lower extremities Cerebellar exam normal finger to nose to finger as well as heel to shin in left  upper and lower extremities, moderate dysmetria on RIght Musculoskeletal: Full range of motion in all 4 extremities. No joint swelling   Assessment/Plan: 1. Functional deficits secondary to left posterior frontal intracranial hemorrhage felt to be secondary to hypertensive crisis with right sided ataxia and sensory deficits as well as aphasiawhich require 3+ hours per day of interdisciplinary therapy in a comprehensive inpatient rehab setting. Physiatrist is providing close team supervision and 24 hour management of active medical problems listed below. Physiatrist and rehab team continue to assess barriers to discharge/monitor patient  progress toward functional and medical goals. FIM: Function - Bathing Bathing activity did not occur: Refused Position: Shower Body parts bathed by patient: Right arm, Left arm, Chest, Abdomen, Front perineal area, Buttocks, Right upper leg, Left upper leg, Right lower leg, Left lower leg Body parts bathed by helper: Back Bathing not applicable: Left lower leg Assist Level: Touching or steadying assistance(Pt > 75%)  Function- Upper Body Dressing/Undressing What is the patient wearing?: Pull over shirt/dress Pull over shirt/dress - Perfomed by patient: Thread/unthread right sleeve, Thread/unthread left sleeve, Put head through opening, Pull shirt over trunk Assist Level: Supervision or verbal cues Set up : To obtain clothing/put away Function - Lower Body Dressing/Undressing What is the patient wearing?: Pants, Socks, Shoes Position: Wheelchair/chair at sink Underwear - Performed by patient: Pull underwear up/down, Thread/unthread right underwear leg, Thread/unthread left underwear leg Underwear - Performed by helper: Thread/unthread right underwear leg, Thread/unthread left underwear leg Pants- Performed by patient: Thread/unthread right pants leg, Thread/unthread left pants leg, Pull pants up/down Pants- Performed by helper: Thread/unthread left pants leg, Thread/unthread right pants leg Socks - Performed by patient: Don/doff right sock, Don/doff left sock Shoes - Performed by patient: Don/doff right shoe, Don/doff left shoe, Fasten right, Fasten left (issued shoe buttons) Shoes - Performed by helper: Fasten right, Fasten left Assist Level: Touching or steadying assistance (Pt > 75%)  Function - Toileting Toileting activity did not occur: Refused Toileting steps completed by patient: Adjust clothing prior to toileting, Performs perineal hygiene, Adjust clothing after toileting Toileting Assistive Devices: Grab bar or rail Assist level: Supervision or verbal cues  Function - Transport planner transfer activity did not occur:  (standing to void) Toilet transfer assistive device: Elevated toilet seat/BSC over toilet Assist level to toilet: Supervision or verbal cues Assist level from toilet: Supervision or verbal cues Assist level to bedside commode (at bedside): Touching or steadying assistance (Pt > 75%) Assist level from bedside commode (at bedside): Touching or steadying assistance (Pt > 75%)  Function - Chair/bed transfer Chair/bed transfer method: Ambulatory, Stand pivot Chair/bed transfer assist level: Supervision or verbal cues Chair/bed transfer assistive device: Armrests Chair/bed transfer details: Verbal cues for precautions/safety (attention to Rt side)  Function - Locomotion: Wheelchair Will patient use wheelchair at discharge?: No Type: Manual Max wheelchair distance: 100 Assist Level: Touching or steadying assistance (Pt > 75%) Assist Level: Touching or steadying assistance (Pt > 75%) Assist Level: Touching or steadying assistance (Pt > 75%) Turns around,maneuvers to table,bed, and toilet,negotiates 3% grade,maneuvers on rugs and over doorsills: No Function - Locomotion: Ambulation Assistive device: No device Max distance: 500' Assist level: Touching or steadying assistance (Pt > 75%) Assist level: Supervision or verbal cues Assist level: Supervision or verbal cues  Assist level: Supervision or verbal cues Assist level: Touching or steadying assistance (Pt > 75%)  Function - Comprehension Comprehension: Auditory Comprehension assist level: Follows basic conversation/direction with extra time/assistive device  Function - Expression Expression: Verbal Expression assist level: Expresses basic 50 - 74% of the time/requires cueing 25 - 49% of the time. Needs to repeat parts of sentences.  Function - Social Interaction Social Interaction assist level: Interacts appropriately with others with medication or extra time (anti-anxiety,  antidepressant).  Function - Problem Solving Problem solving assist level: Solves basic 90% of the time/requires cueing < 10% of the time  Function - Memory Memory assist level: Recognizes or recalls 75 - 89% of the time/requires cueing 10 - 24% of the time Patient normally able to recall (first 3 days only): That he or she is in a hospital, Current season, Location of own room, Staff names and faces Medical Problem List and Plan: 1. Functional deficits secondary to left posterior frontal intracranial hemorrhage felt to be secondary to hypertensive crisis, severe sensory deficits RUE and RLE 2.  DVT Prophylaxis/Anticoagulation: Subcutaneous  lovenox 3. Pain Management: Tylenol as needed 4. Acute respiratory failure. Extubated 12/14/2014. Comfortable/no distress currently 5. Neuropsych: This patient is capable of making decisions on his own behalf. 6. Skin/Wound Care: Routine skin checks 7. Fluids/Electrolytes/Nutrition: Routine I&O, eating 100% meals 8. Hypertension. Norvasc 10 mg daily, Avapro 300 mg daily, hydrochlorothiazide 25 mg daily. Monitor with increased mobility---fair control at present 9. Diabetes mellitus of peripheral neuropathy. Hemoglobin A1c 7.5.              -CBG's still high---increase  lantus to 24U         10. History of alcohol use. Provide counseling.  no signs of withdrawal 11. Hyperlipidemia. Lipitor 12.  Hypokalemia improved on KCL,  13.  Constipation-senna S bid;daily BM  LOS (Days) 7 A FACE TO FACE EVALUATION WAS PERFORMED  KIRSTEINS,ANDREW E 12/23/2014, 7:28 AM

## 2014-12-23 NOTE — Progress Notes (Signed)
Occupational Therapy Session Note  Patient Details  Name: Richard [name] MRN: [id number] Date of Birth: Feb 19, 1946  Today's Date: 12/23/2014 OT Individual Time: 5621-3086 OT Individual Time Calculation (min): 60 min    Short Term Goals: Week 1:  OT Short Term Goal 1 (Week 1): Pt will complete bathing at sit > stand level in shower with supevision OT Short Term Goal 2 (Week 1): Pt will complete LB dressing with min assist OT Short Term Goal 3 (Week 1): Pt will complete 2 grooming tasks in standing with supervision OT Short Term Goal 4 (Week 1): Pt will complete toilet transfers with supervision with LRAD OT Short Term Goal 5 (Week 1): Pt will utilize RUE as nondominant UE during self-care tasks  Skilled Therapeutic Interventions/Progress Updates:    ADL retraining with focus on Rt attention with mobility, bathing, and dressing, as well as functional use of dominant RUE.  Pt ambulated to bathroom without AD and supervision, with cues for gait speed and attention to obstacles on Rt.  Bathing completed at sit > stand level with close supervision when standing to wash buttocks and when drying buttocks.  Dressing completed with increased time and verbal cues for technique with fastening shoe buttons.  RUE NMR with focus on grading pressure with grasp when setting up small animal figurines requiring gentle touch.  Pt often overshooting when picking up items and demonstrating difficulty when releasing small items.  Setup for breakfast with opening packets and cutting food.  Therapy Documentation Precautions:  Precautions Precautions: Fall Restrictions Weight Bearing Restrictions: No Pain:  Pt with c/o pain in Rt wrist, however declines medication  See Function Navigator for Current Functional Status.   Therapy/Group: Individual Therapy  Rosalio Loud 12/23/2014, 8:53 AM

## 2014-12-23 NOTE — Progress Notes (Signed)
Speech Language Pathology Daily Session Note  Patient Details  Name: Richard [name] MRN: [id number] Date of Birth: 1945-12-30  Today's Date: 12/23/2014 SLP Individual Time: 1330-1400 SLP Individual Time Calculation (min): 30 min  Short Term Goals: Week 1: SLP Short Term Goal 1 (Week 1): Pt will monitor and correct verbal errors during conversations for 75% accuracy with min assist verbal cues.  SLP Short Term Goal 2 (Week 1): Pt will consume trials of regular textures with mod I use of lingual sweep and liquid wash to manage oral residue over three targeted sessions prior to diet advancement.   SLP Short Term Goal 3 (Week 1): Pt will utilize compensatory word finding strategies to make needs/wants known during conversations with min assist verbal cues.  SLP Short Term Goal 4 (Week 1): Pt will complete basic to semi-complex self care and/or home management tasks with min assist verbal cues for functional problem solving.   SLP Short Term Goal 5 (Week 1): Pt will utilize compensatory memory aids to faciliitate recall of new information with min assist verbal cues.    Skilled Therapeutic Interventions: Skilled treatment session focused on cognitive-linguistic goals. Upon arrival, patient was awake while supine in bed and agreeable to participate in treatment session. Patient sat EOB and required Mod-Max A multimodal cues for functional problem solving during a basic money management task, function was impacted by decreased recall and expressive language impairments. Patient also required Mod-Max A multimodal cues to self-monitor and correct errors throughout task. Patient handed off to PT. Continue with current plan of care.    Function:  Cognition Comprehension Comprehension assist level: Follows basic conversation/direction with extra time/assistive device  Expression   Expression assist level: Expresses basic 50 - 74% of the time/requires cueing 25 - 49% of the time. Needs to repeat parts of  sentences.  Social Interaction Social Interaction assist level: Interacts appropriately with others with medication or extra time (anti-anxiety, antidepressant).  Problem Solving Problem solving assist level: Solves basic 90% of the time/requires cueing < 10% of the time  Memory Memory assist level: Recognizes or recalls 75 - 89% of the time/requires cueing 10 - 24% of the time    Pain Pain Assessment Pain Assessment: No/denies pain  Therapy/Group: Individual Therapy  PAYNE, COURTNEY 12/23/2014, 2:27 PM

## 2014-12-23 NOTE — Progress Notes (Signed)
Speech Language Pathology Daily Session Note  Patient Details  Name: Richard [name] MRN: [id number] Date of Birth: 1946-01-28  Today's Date: 12/23/2014 SLP Individual Time: 0905-1005 SLP Individual Time Calculation (min): 60 min  Short Term Goals: Week 1: SLP Short Term Goal 1 (Week 1): Pt will monitor and correct verbal errors during conversations for 75% accuracy with min assist verbal cues.  SLP Short Term Goal 2 (Week 1): Pt will consume trials of regular textures with mod I use of lingual sweep and liquid wash to manage oral residue over three targeted sessions prior to diet advancement.   SLP Short Term Goal 3 (Week 1): Pt will utilize compensatory word finding strategies to make needs/wants known during conversations with min assist verbal cues.  SLP Short Term Goal 4 (Week 1): Pt will complete basic to semi-complex self care and/or home management tasks with min assist verbal cues for functional problem solving.   SLP Short Term Goal 5 (Week 1): Pt will utilize compensatory memory aids to faciliitate recall of new information with min assist verbal cues.    Skilled Therapeutic Interventions:  Pt was seen for skilled ST targeting cognitive-linguistic goals.  Upon arrival, pt was seated upright in recliner, awake, alert, and agreeable to participate in ST.  Pt ambulated to ST treatment room with supervision for safety awareness. SLP facilitated the session with a semi-complex, board game/barrier activity targeting use of strategies to facilitate functional communication and functional problem solving.  Pt described characters from board game with extra time and min assist faded to supervision cues for monitoring and correction of verbal errors.  Pt also benefited from min faded to supervision cues for working memory of task rules in order to effectively plan and execute a problem solving strategy.  Pt required x1 verbal cue for route recall when ambulating back to his room from ST treatment room.   Pt was left upright in recliner with call bell left within reach.  Continue per current plan of care.    Function:  Eating Eating   Modified Consistency Diet: Yes Eating Assist Level: More than reasonable amount of time;Set up assist for   Eating Set Up Assist For: Opening containers;Cutting food       Cognition Comprehension Comprehension assist level: Follows basic conversation/direction with extra time/assistive device  Expression   Expression assist level: Expresses basic 75 - 89% of the time/requires cueing 10 - 24% of the time. Needs helper to occlude trach/needs to repeat words.  Social Interaction Social Interaction assist level: Interacts appropriately with others with medication or extra time (anti-anxiety, antidepressant).  Problem Solving Problem solving assist level: Solves basic 90% of the time/requires cueing < 10% of the time  Memory Memory assist level: Recognizes or recalls 75 - 89% of the time/requires cueing 10 - 24% of the time    Pain Pain Assessment Pain Assessment: No/denies pain  Therapy/Group: Individual Therapy  Lavonia Eager, Melanee Spry 12/23/2014, 12:32 PM

## 2014-12-23 NOTE — Progress Notes (Signed)
Physical Therapy Note  Patient Details  Name: Tamarick Kovalcik MRN: [id number] Date of Birth: [birth year] Today's Date: 12/23/2014    Time: 1400-1455 55 mins  1:1 no c/o pain.  Gait in controlled environment with 1 LOB when distracted.  Gait carrying ball, bouncing and tossing ball with close supervision, LOB occuring when distracted, requiring min A to correct.  Soccer ball kick with close supervision/min guard, balance improves when pt slows down.  Gait outside on various surfaces, up/down inclines, curbs, stairs with close supervision.  Pt able to problem solve path finding to unit from outside.  Pt still with difficulty with balance when distracted or moving too fast.   DONAWERTH,KAREN 12/23/2014, 3:21 PM

## 2014-12-23 NOTE — Plan of Care (Signed)
Problem: RH BLADDER ELIMINATION Goal: RH STG MANAGE BLADDER WITH EQUIPMENT WITH ASSISTANCE STG Manage Bladder With Equipment With min Assistance  Outcome: Completed/Met Date Met:  12/23/14 Pt no longer has a catheter

## 2014-12-24 ENCOUNTER — Ambulatory Visit (HOSPITAL_COMMUNITY): Payer: Medicare HMO | Admitting: Speech Pathology

## 2014-12-24 ENCOUNTER — Inpatient Hospital Stay (HOSPITAL_COMMUNITY): Payer: Medicare HMO | Admitting: Occupational Therapy

## 2014-12-24 ENCOUNTER — Inpatient Hospital Stay (HOSPITAL_COMMUNITY): Payer: Medicare HMO | Admitting: Physical Therapy

## 2014-12-24 DIAGNOSIS — I69898 Other sequelae of other cerebrovascular disease: Secondary | ICD-10-CM

## 2014-12-24 LAB — GLUCOSE, CAPILLARY
GLUCOSE-CAPILLARY: 150 mg/dL — AB (ref 65–99)
Glucose-Capillary: 314 mg/dL — ABNORMAL HIGH (ref 65–99)
Glucose-Capillary: 150 mg/dL — ABNORMAL HIGH (ref 65–99)
Glucose-Capillary: 240 mg/dL — ABNORMAL HIGH (ref 65–99)

## 2014-12-24 NOTE — Progress Notes (Signed)
Subjective/Complaints:  Discussed with OT, good RUE strength but use limited by poor sensation ROS limited by language     Objective: Vital Signs: Blood pressure 120/79, pulse 76, temperature 98.3 F (36.8 C), temperature source Oral, resp. rate 17, height 5\' 11"  (1.803 m), weight 92.1 kg (203 lb 0.7 oz), SpO2 95 %. No results found. Results for orders placed or performed during the hospital encounter of 12/16/14 (from the past 72 hour(s))  Glucose, capillary     Status: Abnormal   Collection Time: 12/21/14 11:53 AM  Result Value Ref Range   Glucose-Capillary 194 (H) 65 - 99 mg/dL  Glucose, capillary     Status: Abnormal   Collection Time: 12/21/14  4:33 PM  Result Value Ref Range   Glucose-Capillary 169 (H) 65 - 99 mg/dL   Comment 1 Notify RN   Glucose, capillary     Status: Abnormal   Collection Time: 12/21/14  8:52 PM  Result Value Ref Range   Glucose-Capillary 198 (H) 65 - 99 mg/dL  Glucose, capillary     Status: Abnormal   Collection Time: 12/22/14  6:40 AM  Result Value Ref Range   Glucose-Capillary 195 (H) 65 - 99 mg/dL  Glucose, capillary     Status: Abnormal   Collection Time: 12/22/14 11:04 AM  Result Value Ref Range   Glucose-Capillary 272 (H) 65 - 99 mg/dL  Glucose, capillary     Status: Abnormal   Collection Time: 12/22/14  4:15 PM  Result Value Ref Range   Glucose-Capillary 177 (H) 65 - 99 mg/dL  Glucose, capillary     Status: Abnormal   Collection Time: 12/22/14  8:27 PM  Result Value Ref Range   Glucose-Capillary 262 (H) 65 - 99 mg/dL  Glucose, capillary     Status: Abnormal   Collection Time: 12/23/14  6:43 AM  Result Value Ref Range   Glucose-Capillary 193 (H) 65 - 99 mg/dL  Glucose, capillary     Status: Abnormal   Collection Time: 12/23/14 11:13 AM  Result Value Ref Range   Glucose-Capillary 296 (H) 65 - 99 mg/dL  Glucose, capillary     Status: Abnormal   Collection Time: 12/23/14  4:10 PM  Result Value Ref Range   Glucose-Capillary 172  (H) 65 - 99 mg/dL  Glucose, capillary     Status: Abnormal   Collection Time: 12/23/14  9:09 PM  Result Value Ref Range   Glucose-Capillary 209 (H) 65 - 99 mg/dL  Glucose, capillary     Status: Abnormal   Collection Time: 12/24/14  6:31 AM  Result Value Ref Range   Glucose-Capillary 150 (H) 65 - 99 mg/dL      General: No acute distress Mood and affect are appropriate Eyes- prosthetic Left eye, 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 motor strength is 5/5 in bilateral deltoid, bicep, tricep, grip, hip flexor, knee extensors, ankle dorsiflexor and plantar flexor Sensory exam reduced  sensation to light touch and proprioception in right  upper , intact R lower extremity Cerebellar exam normal finger to nose to  finger as well as heel to shin in left  upper and lower extremities, moderate dysmetria on RIght Musculoskeletal: Full range of motion in all 4 extremities. No joint swelling   Assessment/Plan: 1. Functional deficits secondary to left posterior frontal intracranial hemorrhage felt to be secondary to hypertensive crisis with right sided ataxia and sensory deficits as well as aphasiawhich require 3+ hours per day of interdisciplinary therapy in a comprehensive inpatient rehab setting. Physiatrist is providing close team supervision and 24 hour management of active medical problems listed below. Physiatrist and rehab team continue to assess barriers to discharge/monitor patient progress toward functional and medical goals. Team conference today please see physician documentation under team  conference tab, met with team face-to-face to discuss problems,progress, and goals. Formulized individual treatment plan based on medical history, underlying problem and comorbidities. FIM: Function - Bathing Bathing activity did not occur: Refused Position: Shower Body parts bathed by patient: Right arm, Left arm, Chest, Abdomen, Front perineal area, Buttocks, Right upper leg, Left upper leg, Right lower leg, Left lower leg Body parts bathed by helper: Back Bathing not applicable: Back Assist Level: Supervision or verbal cues  Function- Upper Body Dressing/Undressing What is the patient wearing?: Pull over shirt/dress Pull over shirt/dress - Perfomed by patient: Thread/unthread right sleeve, Thread/unthread left sleeve, Put head through opening, Pull shirt over trunk Assist Level: More than reasonable time Set up : To obtain clothing/put away Function - Lower Body Dressing/Undressing What is the patient wearing?: Underwear, Pants, Socks, Shoes Position: Sitting EOB Underwear - Performed by patient: Pull underwear up/down, Thread/unthread right underwear leg, Thread/unthread left underwear leg Underwear - Performed by helper: Thread/unthread right underwear leg, Thread/unthread left underwear leg Pants- Performed by patient: Thread/unthread right pants leg, Thread/unthread left pants leg, Pull pants up/down Pants- Performed by helper: Thread/unthread left pants leg, Thread/unthread right pants leg Socks - Performed by patient: Don/doff right sock, Don/doff left sock Shoes - Performed by patient: Don/doff right shoe, Don/doff left shoe, Fasten right, Fasten left Shoes - Performed by helper: Fasten right, Fasten left Assist Level: Supervision or verbal cues  Function - Toileting Toileting activity did not occur: Refused Toileting steps completed by patient: Adjust clothing prior to toileting, Performs perineal hygiene, Adjust clothing after toileting Toileting Assistive Devices: Grab bar  or rail Assist level: More than reasonable time  Function - Air cabin crew transfer activity did not occur:  (standing to void) Toilet transfer assistive device: Elevated toilet seat/BSC over toilet Assist level to toilet: Supervision or verbal cues Assist level from toilet: Supervision or verbal cues Assist level to bedside commode (at bedside): Touching or steadying assistance (Pt > 75%) Assist level from bedside commode (at bedside): Touching or steadying assistance (Pt > 75%)  Function - Chair/bed transfer Chair/bed transfer method: Ambulatory, Stand pivot Chair/bed transfer assist level: Supervision or verbal cues Chair/bed transfer assistive device: Armrests Chair/bed transfer details: Verbal cues for precautions/safety  Function - Locomotion: Wheelchair Will patient use wheelchair at discharge?: No Type: Manual Max wheelchair distance: 100 Assist Level: Touching or steadying assistance (Pt > 75%) Assist Level: Touching or steadying assistance (Pt > 75%) Assist Level: Touching or steadying assistance (Pt > 75%) Turns around,maneuvers to table,bed, and toilet,negotiates 3% grade,maneuvers on rugs and over doorsills: No Function - Locomotion: Ambulation Assistive device: No device Max distance: 500' Assist level: Touching or steadying assistance (Pt > 75%) Assist level: Supervision or verbal cues Assist level: Supervision or verbal cues Assist level: Supervision or verbal cues Assist level: Touching or steadying assistance (  Pt > 75%)  Function - Comprehension Comprehension: Auditory Comprehension assist level: Follows basic conversation/direction with extra time/assistive device  Function - Expression Expression: Verbal Expression assist level: Expresses basic 50 - 74% of the time/requires cueing 25 - 49% of the time. Needs to repeat parts of sentences.  Function - Social Interaction Social Interaction assist level: Interacts appropriately with others with  medication or extra time (anti-anxiety, antidepressant).  Function - Problem Solving Problem solving assist level: Solves complex 90% of the time/cues < 10% of the time  Function - Memory Memory assist level: Recognizes or recalls 75 - 89% of the time/requires cueing 10 - 24% of the time Patient normally able to recall (first 3 days only): That he or she is in a hospital, Current season, Location of own room, Staff names and faces Medical Problem List and Plan: 1. Functional deficits secondary to left posterior frontal intracranial hemorrhage felt to be secondary to hypertensive crisis, severe sensory deficits RUE>> RLE 2.  DVT Prophylaxis/Anticoagulation: Subcutaneous  lovenox 3. Pain Management: Tylenol as needed 4. Acute respiratory failure. Extubated 12/14/2014. Comfortable/no distress currently 5. Neuropsych: This patient is capable of making decisions on his own behalf. 6. Skin/Wound Care: Routine skin checks 7. Fluids/Electrolytes/Nutrition: Routine I&O, eating 100% meals 8. Hypertension. Norvasc 10 mg daily, Avapro 300 mg daily, hydrochlorothiazide 25 mg daily. Monitor with increased mobility---129/70 this am 9. Diabetes mellitus of peripheral neuropathy. Hemoglobin A1c 7.5.              -CBG's still high---increase  lantus to 30U         10. History of alcohol use. Provide counseling.  no signs of withdrawal, do not anticipate any WD symptoms at this point 11. Hyperlipidemia. Lipitor 12.  Hypokalemia improved on KCL,  13.  Constipation-senna S bid;daily BM  LOS (Days) 8 A FACE TO FACE EVALUATION WAS PERFORMED  KIRSTEINS,ANDREW E 12/24/2014, 8:05 AM

## 2014-12-24 NOTE — Plan of Care (Signed)
Problem: RH Simple Meal Prep Goal: LTG Patient will perform simple meal prep w/assist (OT) LTG: Patient will perform simple meal prep with assistance, with/without cues (OT).  Outcome: Not Applicable Date Met:  06/09/92 D/C due to not appropriate at this time.  Have recommended due to impaired sensation and proprioception in dominant RUE, that family members complete all hot meal prep.

## 2014-12-24 NOTE — Progress Notes (Signed)
Physical Therapy Session Note  Patient Details  Name: Richard [name] MRN: [id number] Date of Birth: Dec 29, 1945  Today's Date: 12/24/2014 PT Individual Time: 0900-1000 PT Individual Time Calculation (min): 60 min   Short Term Goals: Week 1:  PT Short Term Goal 1 (Week 1): =LTG due to estimated LOS  Skilled Therapeutic Interventions/Progress Updates:    Pt received seated in w/c with R hand pain as described below; agreeable to treatment. Pt donned t-shirt in seated position with supervision. Gait to therapy gym with supervision x170'; no LOB noted, occasional R toe drag. Nustep x10 min with BUE/BLE for LE strengthening, aerobic endurance on level 6 with average 30 steps/min. Standing dynamic balance activities with bimanual tasks while playing basketball; performed to improve dynamic balance with cognitive dual task, RUE coordination. Occasional mild LOB however pt able to recover without assistance. Fine motor control activity with pt performing clothespin tree with RUE in standing for RUE coordination and standing tolerance/endurance. Requires increased time to perform due to decreased sensation, proprioception and coordination in RUE; pt becomes mildly frustrated however willing to continue trying until activity is complete. Gait training in hallway x500' with supervision; included change in surface, obstacle negotiation, navigating around people, cognitive dual task, and recall of directions to return to room. Supervision overall, noted occasional R toe drag. Pt reports he is becoming more aware of R side deficits and is paying more attention to R foot while walking. Pt remained seated in w/c in room at completion of session, all needs within reach.   Therapy Documentation Precautions:  Precautions Precautions: Fall Restrictions Weight Bearing Restrictions: No Pain: Pain Assessment Pain Assessment: 0-10 Pain Score: 2  Pain Type: Chronic pain Pain Location: Hand Pain Orientation: Right Pain  Descriptors / Indicators: Aching;Tingling Pain Onset: On-going Patients Stated Pain Goal: 0 Pain Intervention(s): Repositioned;Distraction Multiple Pain Sites: No   See Function Navigator for Current Functional Status.   Therapy/Group: Individual Therapy  Vista Lawman 12/24/2014, 10:27 AM

## 2014-12-24 NOTE — Progress Notes (Signed)
Occupational Therapy Session Note  Patient Details  Name: Richard [name] MRN: [id number] Date of Birth: 1945/05/08  Today's Date: 12/24/2014 OT Individual Time: 7829-5621 OT Individual Time Calculation (min): 60 min    Short Term Goals: Week 1:  OT Short Term Goal 1 (Week 1): Pt will complete bathing at sit > stand level in shower with supevision OT Short Term Goal 2 (Week 1): Pt will complete LB dressing with min assist OT Short Term Goal 3 (Week 1): Pt will complete 2 grooming tasks in standing with supervision OT Short Term Goal 4 (Week 1): Pt will complete toilet transfers with supervision with LRAD OT Short Term Goal 5 (Week 1): Pt will utilize RUE as nondominant UE during self-care tasks  Skilled Therapeutic Interventions/Progress Updates:    ADL retraining with focus on Rt attention with mobility, bathing, and dressing as well as functional use of dominant RUE.  Pt ambulated to bathroom without AD with supervision demonstrating improved safety with mobility this session.  Required verbal cues for safety in shower with impulsive sit <> stand, encouraged reaching back for chair prior to sitting to control decent.  Pt dropped wash cloth approx 4-5 times, requiring cues 50% of those times.  Pt completed grooming tasks in sitting due to fatigue, showing improved awareness by requesting to sit.  Educated on use of vision to compensate for impaired sensation and proprioception in dominant RUE, as pt tends to drop wash cloth during bathing and will bump RUE into shower wall or doorframes with ambulation.  Recommend pt not engage in any hot meal prep due to impaired sensation and proprioception in RUE, pt may assist or supervise with cooking tasks but is not to engage in cutting or hot meal prep - with pt reporting understanding.  Therapy Documentation Precautions:  Precautions Precautions: Fall Restrictions Weight Bearing Restrictions: No General:   Vital Signs: Therapy Vitals Temp: 98.3  F (36.8 C) Temp Source: Oral Pulse Rate: 76 Resp: 17 BP: 120/79 mmHg Patient Position (if appropriate): Lying Oxygen Therapy SpO2: 95 % O2 Device: Not Delivered Pain:  Pt with c/o tingling sensation in Rt wrist and palm, RN aware.  See Function Navigator for Current Functional Status.   Therapy/Group: Individual Therapy  Rosalio Loud 12/24/2014, 8:03 AM

## 2014-12-24 NOTE — Plan of Care (Signed)
Problem: RH Wheelchair Mobility Goal: LTG Patient will propel w/c in controlled environment (PT) LTG: Patient will propel wheelchair in controlled environment, # of feet with assist (PT)  Outcome: Not Applicable Date Met:  61/60/73 D/C 12/24/14 due to pt fully ambulatory

## 2014-12-24 NOTE — Progress Notes (Signed)
Social Work Patient ID: Richard [name], male   DOB: 07-Mar-1946, 69 y.o.   MRN: [id number] Met with pt and spoke with son to inform team conference goals-supervision level due to communication and for safety. Discharge date still 9/30.  Pt is very pleased with his progress and will be ready to go home Friday. Will refer to home health due to pt doesn't have transport To outpatient and his co-pays are high. Will work toward discharge on Friday.

## 2014-12-24 NOTE — Patient Care Conference (Signed)
Inpatient RehabilitationTeam Conference and Plan of Care Update Date: 12/24/2014   Time: 11;20 AM    Patient Name: Richard [name]      Medical Record Number: [id number]  Date of Birth: 06-09-1945 Sex: Male         Room/Bed: 4M10C/4M10C-01 Payor Info: Payor: [employer] MEDICARE / Plan: [employer] MEDICARE HMO/PPO / Product Type: *No Product type* /    Admitting Diagnosis: L ICH  Admit Date/Time:  12/16/2014  9:42 PM Admission Comments: No comment available   Primary Diagnosis:  Ataxia, post-stroke Principal Problem: Ataxia, post-stroke  Patient Active Problem List   Diagnosis Date Noted  . 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 Type 2 diabetes mellitus with peripheral neuropathy   . Essential hypertension   . Alcohol withdrawal   . Cerebral hemorrhage   . Endotracheally intubated   . ICH (intracerebral hemorrhage) 12/10/2014    Expected Discharge Date: Expected Discharge Date: 12/26/14  Team Members Present: Physician leading conference: Dr. Greig Castilla Kirsteins;Dr. Maryla Morrow Social Worker Present: Dossie Der, LCSW Nurse Present: Carmie End, RN PT Present: Alyson Reedy, PT OT Present: Rosalio Loud, OT SLP Present: Fae Pippin, SLP PPS Coordinator present : Tora Duck, RN, CRRN     Current Status/Progress Goal Weekly Team Focus  Medical   reduced sensation RUE, aphasia, poor diabetic control  mod I goals  for home d/c  Increase Lantus, upgrade diet   Bowel/Bladder   Cont bowel and bladder. LBM 9/26  Manage bowel and bladder with MOD I level  Remain cont of bowel and bladder   Swallow/Nutrition/ Hydration   Dys 3, thin liquids; pending trial meal tray of regular textures this PM, pt's diet may be advanced   mod I with least restrictive diet   pending trial meal tray    ADL's   supervision transfers and self-care tasks, impaired proprioception and sensation in dominant RUE   Mod I self-care tasks,  supervision overall for safety  RUE attention, NMR, safety awareness, pt and family education   Mobility   supervision gait, transfers, stairs  modified independent basic transfers and w/c x 150' controlled enc; supervision car transfers and gait x 500' controlled env, 300' community env and up/down 12 ^' steps  neuro re-ed, higher level balance activities, dynamic gait with cognitive dual task, education and HEP in preparation for d/c   Communication   approaching long term goals for use of compensatory communication strategies, min-mod assist for basic communication   min assist   continue to address recognition and correction of verbal errors, completion of education prior to discharge, carrryover of compensatory strategies    Safety/Cognition/ Behavioral Observations  min deficits, improving emergent awareness of deficits, slightly impulsive   supervision   complete education prior to discharge, continue to address safety awareness and basic to semi-complex cognitive tasks    Pain   c/o minimal pain to R hand, refuses pain meds at this time  Pain less than 3  Assess pain q shift and PRN   Skin   No skin breakdown  No new skin breakdown while on Rehab  Assess skin q shift and PRN      *See Care Plan and progress notes for long and short-term goals.  Barriers to Discharge: CBGs elevated, wife is homebound    Possible Resolutions to Barriers:  See above, HH vs outpt therapy post D/C    Discharge Planning/Teaching Needs:  Home with wife and son who is there in  the evening, can provide supervision level      Team Discussion:  Upgraded diet to regular-thin. Goals-supervision level due to inattention, awareness and impulsive. Communication is getting better-able to get more words out. Cont B & B  Revisions to Treatment Plan:  Upgraded diet-regular   Continued Need for Acute Rehabilitation Level of Care: The patient requires daily medical management by a physician with specialized  training in physical medicine and rehabilitation for the following conditions: Daily direction of a multidisciplinary physical rehabilitation program to ensure safe treatment while eliciting the highest outcome that is of practical value to the patient.: Yes Daily medical management of patient stability for increased activity during participation in an intensive rehabilitation regime.: Yes Daily analysis of laboratory values and/or radiology reports with any subsequent need for medication adjustment of medical intervention for : Neurological problems;Other  Richard [name], Richard [name] 12/25/2014, 2:06 PM

## 2014-12-24 NOTE — Progress Notes (Signed)
Physical Therapy Session Note  Patient Details  Name: Richard [name] MRN: [id number] Date of Birth: Feb 11, 1946  Today's Date: 12/24/2014 PT Individual Time: 1010-1037 PT Individual Time Calculation (min): 27 min   Short Term Goals: Week 1:  PT Short Term Goal 1 (Week 1): =LTG due to estimated LOS  Skilled Therapeutic Interventions/Progress Updates:   Pt received in seated in w/c; focused session on reviewing car transfer for D/C with pt demonstrating safe car entry/exit in simulated car mod I-no verbal cues needed for safety.  Also educated pt on floor <> furniture transfer and discussed indications for calling EMS after a fall; pt return demonstrated floor > furniture transfer Mod I with pt verbalizing full sequence.  Engaged pt in dual task dynamic gait and balance activity with pt cued to ambulate forwards and backwards while tossing/bouncing a ball while performing cognitive/naming activity.  Pt with increased difficulty with word finding but when prompted with questions pt able to name more quickly and automatically.  Discussed falls risk during dual task activities and strategies to minimize risk in community and at home.  Pt returned to room to rest in w/c.  Controlled environment w/c goal D/C due to pt fully ambulatory on unit.   Therapy Documentation Precautions:  Precautions Precautions: Fall Restrictions Weight Bearing Restrictions: No Pain: Pain Assessment Pain Assessment: 0-10 Pain Score: 2  Pain Type: Chronic pain Pain Location: Hand Pain Orientation: Right Pain Descriptors / Indicators: Aching;Tingling Pain Onset: On-going Patients Stated Pain Goal: 0 Pain Intervention(s): Repositioned;Distraction Multiple Pain Sites: No   See Function Navigator for Current Functional Status.   Therapy/Group: Individual Therapy  Edman Circle [facility] 12/24/2014, 12:14 PM

## 2014-12-24 NOTE — Progress Notes (Signed)
Speech Language Pathology Daily Session Note  Patient Details  Name: Ryin Ambrosius MRN: [id number] Date of Birth: 1946/03/28  Today's Date: 12/24/2014 SLP Individual Time: 0865-7846 SLP Individual Time Calculation (min): 60 min  Short Term Goals: Week 1: SLP Short Term Goal 1 (Week 1): Pt will monitor and correct verbal errors during conversations for 75% accuracy with min assist verbal cues.  SLP Short Term Goal 2 (Week 1): Pt will consume trials of regular textures with mod I use of lingual sweep and liquid wash to manage oral residue over three targeted sessions prior to diet advancement.   SLP Short Term Goal 3 (Week 1): Pt will utilize compensatory word finding strategies to make needs/wants known during conversations with min assist verbal cues.  SLP Short Term Goal 4 (Week 1): Pt will complete basic to semi-complex self care and/or home management tasks with min assist verbal cues for functional problem solving.   SLP Short Term Goal 5 (Week 1): Pt will utilize compensatory memory aids to faciliitate recall of new information with min assist verbal cues.    Skilled Therapeutic Interventions: Skilled treatment session focused on addressing cognition and swallowing goals as well as language goals. SLP facilitated session by providing set-up assist for containers and cutting food as well as Min verbal cues to problem solve portions of set-up such as salt and pepper once they were opened.  Patient consumed regular textures and thin liqudis Mod I for portion cortol, pacing and management of oral residue/pocketing with lingual sweep and thin liquid washes.  Patient demonstrated no overt s/s of aspiration; recommend to continue with diet upgrade.  SLP also facilitated session with Mod verbal and written cues for verbal expression of dinner order.  Continue with current plan of care.    Function:  Eating Eating   Modified Consistency Diet: No Eating Assist Level: More than reasonable amount of  time;Set up assist for;Swallowing techniques: self managed   Eating Set Up Assist For: Cutting food;Opening containers       Cognition Comprehension Comprehension assist level: Follows basic conversation/direction with extra time/assistive device  Expression   Expression assist level: Expresses basic 50 - 74% of the time/requires cueing 25 - 49% of the time. Needs to repeat parts of sentences.  Social Interaction Social Interaction assist level: Interacts appropriately with others with medication or extra time (anti-anxiety, antidepressant).  Problem Solving Problem solving assist level: Solves basic problems with no assist  Memory Memory assist level: Recognizes or recalls 75 - 89% of the time/requires cueing 10 - 24% of the time    Pain Pain Assessment Pain Assessment: No/denies pain  Therapy/Group: Individual Therapy  Charlane Ferretti., CCC-SLP [phone]  BOWIE,MELISSA 12/24/2014, 4:59 PM

## 2014-12-25 ENCOUNTER — Inpatient Hospital Stay (HOSPITAL_COMMUNITY): Payer: Medicare HMO | Admitting: Speech Pathology

## 2014-12-25 ENCOUNTER — Inpatient Hospital Stay (HOSPITAL_COMMUNITY): Payer: Medicare HMO | Admitting: Physical Therapy

## 2014-12-25 ENCOUNTER — Inpatient Hospital Stay (HOSPITAL_COMMUNITY): Payer: Medicare HMO | Admitting: Occupational Therapy

## 2014-12-25 LAB — GLUCOSE, CAPILLARY
GLUCOSE-CAPILLARY: 176 mg/dL — AB (ref 65–99)
GLUCOSE-CAPILLARY: 128 mg/dL — AB (ref 65–99)
Glucose-Capillary: 155 mg/dL — ABNORMAL HIGH (ref 65–99)
Glucose-Capillary: 224 mg/dL — ABNORMAL HIGH (ref 65–99)

## 2014-12-25 MED ORDER — INSULIN GLARGINE 100 UNIT/ML ~~LOC~~ SOLN
30.0000 [IU] | Freq: Every day | SUBCUTANEOUS | Status: DC
  Administered 2014-12-25: 30 [IU] via SUBCUTANEOUS
  Filled 2014-12-25 (×2): qty 0.3

## 2014-12-25 NOTE — Progress Notes (Signed)
Social Work Patient ID: Richard [name], male   DOB: 02-12-1946, 69 y.o.   MRN: [id number] Spoke with wife regarding need for tub bench and the private pay cost, she was agreeable for worker to get this for pt. Will make referral to [facility] to deliver to pt's room.

## 2014-12-25 NOTE — Progress Notes (Signed)
Occupational Therapy Discharge Summary  Patient Details  Name: Richard [name] MRN: [id number] Date of Birth: 09-18-1945  Patient has met 43 of 11 long term goals due to improved activity tolerance, improved balance, postural control, ability to compensate for deficits, functional use of  RIGHT upper and RIGHT lower extremity and improved awareness.  Patient to discharge at overall Modified Independent level, supervision with bathing and shower transfers.  Patient's care partner is independent to provide the necessary cognitive assistance at discharge.    Reasons goals not met: N/A  Recommendation:  Patient will benefit from ongoing skilled OT services in home health setting to continue to advance functional skills in the area of BADL, iADL and Reduce care partner burden.  Equipment: tub transfer bench  Reasons for discharge: treatment goals met and discharge from hospital  Patient/family agrees with progress made and goals achieved: Yes  OT Discharge Precautions/Restrictions  Precautions Precautions: Fall General   Vital Signs Therapy Vitals Temp: 98.5 F (36.9 C) Temp Source: Oral Pulse Rate: 90 Resp: 18 BP: 119/72 mmHg Patient Position (if appropriate): Sitting Oxygen Therapy SpO2: 100 % O2 Device: Not Delivered Pain Pain Assessment Pain Assessment: No/denies pain ADL  See Function Navigator Vision/Perception  Vision- History Baseline Vision/History: Wears glasses Wears Glasses: At all times Patient Visual Report: No change from baseline Vision- Assessment Vision Assessment?: Vision impaired- to be further tested in functional context Additional Comments: 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 Perception Comments: decreased attention to RUE  Cognition Overall Cognitive Status: Impaired/Different from baseline Arousal/Alertness: Awake/alert Orientation Level: Oriented X4 Attention: Selective Selective Attention: Appears intact Memory:  Impaired Memory Impairment: Decreased recall of new information Awareness: Impaired Awareness Impairment: Emergent impairment Problem Solving: Impaired Problem Solving Impairment: Functional complex;Verbal complex Executive Function: Self Monitoring;Self Correcting Self Monitoring: Impaired Self Monitoring Impairment: Verbal complex;Functional complex Self Correcting: Impaired Self Correcting Impairment: Verbal complex;Functional complex Behaviors: Impulsive Safety/Judgment: Impaired Sensation Sensation Light Touch: Impaired Detail Light Touch Impaired Details: Impaired RLE;Absent RUE Stereognosis: Impaired Detail Stereognosis Impaired Details: Absent RUE Hot/Cold: Not tested Proprioception: Impaired Detail Proprioception Impaired Details: Impaired RUE;Impaired RLE Coordination Gross Motor Movements are Fluid and Coordinated: Yes Fine Motor Movements are Fluid and Coordinated: No Coordination and Movement Description: Ataxic movements RUE, mild dysmetria RUE exacerbated by impaired depth perception 9 Hole Peg Test: Lt: 46 seconds, Rt: unable to complete any in 2 mins due to impaired sensation and proprioception Motor  Motor Motor: Ataxia Motor - Discharge Observations: mild ataxia, mildly decreased attention to RLE Extremity/Trunk Assessment RUE Assessment RUE Assessment: Exceptions to WFL (ROM and strength WFL, however impaired functionally due to decreased sensation and proprioception) LUE Assessment LUE Assessment: Within Functional Limits   See Function Navigator for Current Functional Status.  Simonne Come 12/25/2014, 3:40 PM

## 2014-12-25 NOTE — Plan of Care (Signed)
Problem: RH Expression Communication Goal: LTG Patient will verbally express basic/complex needs (SLP) LTG: Patient will verbally express basic/complex needs, wants or ideas with cues (SLP)  Outcome: Not Met (add Reason) Pt requires min assist to verbally express basic needs/wants to caregivers

## 2014-12-25 NOTE — Discharge Instructions (Signed)
Inpatient Rehab Discharge Instructions  [facility] Discharge date and time: No discharge date for patient encounter.   Activities/Precautions/ Functional Status: Activity: activity as tolerated Diet: soft Wound Care: none needed Functional status:  ___ No restrictions     ___ Walk up steps independently ___ 24/7 supervision/assistance   ___ Walk up steps with assistance ___ Intermittent supervision/assistance  ___ Bathe/dress independently ___ Walk with walker     ___ Bathe/dress with assistance ___ Walk Independently    ___ Shower independently _x__ Walk with assistance    ___ Shower with assistance ___ No alcohol     ___ Return to work/school ________  Special Instructions:    COMMUNITY REFERRALS UPON DISCHARGE:    Home Health:   PT, SP, RN   Agency:[facility] [phone]   Date of last service:12/26/2014  Medical Equipment/Items Ordered:TUB BENCH  Agency/Supplier:[facility]   [phone]   GENERAL COMMUNITY RESOURCES FOR PATIENT/FAMILY: Support Groups:[facility] SUPPORT GROUP DECLINED SUBSTANCE ABUSE SERVICES-FELT COULD DO ON HIS OWN  My questions have been answered and I understand these instructions. I will adhere to these goals and the provided educational materials after my discharge from the hospital.  Patient/Caregiver Signature _______________________________ Date __________  Clinician Signature _______________________________________ Date __________  Please bring this form and your medication list with you to all your follow-up doctor's appointments.

## 2014-12-25 NOTE — Progress Notes (Signed)
Physical Therapy Discharge Summary  Patient Details  Name: Richard [name] MRN: [id number] Date of Birth: 1946/01/21  Today's Date: 12/25/2014 PT Individual Time: 1100-1200 and 1415-1445 PT Individual Time Calculation (min): 60 min and 30 min (total 90 min)   Patient has met 11 of 11 long term goals due to improved activity tolerance, improved balance, improved postural control, increased strength, ability to compensate for deficits, functional use of  right upper extremity and right lower extremity, improved attention and improved awareness.  Patient to discharge at an ambulatory level Modified Independent.   Patient's care partner is independent to provide the necessary cognitive assistance at discharge. Son attended family education on last day of treatment, and pt given handout with OT/PT recommendations.   Reasons goals not met: All goals met  Recommendation:  Patient will benefit from ongoing skilled PT services in outpatient setting to continue to advance safe functional mobility, address ongoing impairments in balance, strength, activity tolerance, coordination, functional use of RUE, and minimize fall risk.  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, pre-morbid Perception Comments: decreased attention to RUE>RLE  Cognition Overall Cognitive Status: Impaired/Different from baseline Arousal/Alertness: Awake/alert Orientation Level: Oriented X4 Attention: Selective Focused Attention: Appears  intact Sustained Attention: Appears intact Selective Attention: Appears intact Memory: Impaired Memory Impairment: Decreased recall of new information Awareness: Impaired Awareness Impairment: Emergent impairment Problem Solving: Impaired Problem Solving Impairment: Functional complex;Verbal complex Executive Function: Self Monitoring;Self Correcting Self Monitoring: Impaired Self Monitoring Impairment: Verbal complex;Functional complex Self Correcting: Impaired Self Correcting Impairment: Verbal complex;Functional complex Behaviors: Impulsive Safety/Judgment: Impaired Comments: impulsive  Sensation Sensation Light Touch: Impaired Detail Light Touch Impaired Details: Impaired RUE;Impaired RLE Stereognosis: Not tested Hot/Cold: Not tested Proprioception: Impaired Detail Proprioception Impaired Details: Impaired RUE;Impaired RLE Coordination Gross Motor Movements are Fluid and Coordinated: Yes Fine Motor Movements are Fluid and Coordinated: No Heel Shin Test: WFL Motor  Motor Motor: Ataxia Motor - Discharge Observations: mild ataxia, mildly decreased attention to RLE  Mobility Bed Mobility Bed Mobility: Supine to Sit;Sit to Supine Supine to Sit: 6: Modified independent (Device/Increase time) Sit to Supine: 6: Modified independent (Device/Increase time) Transfers Transfers: Yes Sit to Stand: 6: Modified independent (Device/Increase time) Stand to Sit: 6: Modified independent (Device/Increase time) Stand Pivot Transfers: 6: Modified independent (Device/Increase time) Locomotion  Ambulation Ambulation: Yes Ambulation/Gait Assistance: 6: Modified independent (Device/Increase time) Ambulation Distance (Feet): 250 Feet Assistive device: None Gait Gait: Yes Gait Pattern: Impaired Gait Pattern: Poor foot clearance - right (variable step length/width) Gait velocity: 1.11 m/sec High Level Ambulation High Level Ambulation: Head turns Head Turns: mod I Stairs / Additional  Locomotion Stairs: Yes Stairs Assistance: 6: Modified independent (Device/Increase time) Stair Management Technique: One rail Right Number of Stairs: 12 Height of Stairs: 6 Wheelchair Mobility Wheelchair Mobility: No (pt will no use w/c at d/c)  Trunk/Postural Assessment  Cervical Assessment Cervical Assessment: Within Functional Limits Thoracic Assessment Thoracic Assessment: Within Functional Limits Lumbar Assessment Lumbar Assessment: Within Functional Limits Postural Control Postural Control: Deficits on evaluation (decreased balance with reduced BOS, impaired stepping strategies for recovering LOB)  Balance Balance Balance Assessed: Yes Standardized Balance Assessment Standardized Balance Assessment: Merrilee Jansky  Balance Test;Dynamic Gait Index;Timed Up and Go Test Berg Balance Test Sit to Stand: Able to stand without using hands and stabilize independently Standing Unsupported: Able to stand safely 2 minutes Sitting with Back Unsupported but Feet Supported on Floor or Stool: Able to sit safely and securely 2 minutes Stand to Sit: Sits safely with minimal use of hands Transfers: Able to transfer safely, minor use of hands Standing Unsupported with Eyes Closed: Able to stand 10 seconds safely Standing Ubsupported with Feet Together: Able to place feet together independently and stand for 1 minute with supervision From Standing, Reach Forward with Outstretched Arm: Can reach confidently >25 cm (10") From Standing Position, Pick up Object from Floor: Able to pick up shoe safely and easily From Standing Position, Turn to Look Behind Over each Shoulder: Looks behind one side only/other side shows less weight shift Turn 360 Degrees: Able to turn 360 degrees safely one side only in 4 seconds or less Standing Unsupported, Alternately Place Feet on Step/Stool: Able to stand independently and complete 8 steps >20 seconds Standing Unsupported, One Foot in Front: Able to plae foot ahead of the  other independently and hold 30 seconds Standing on One Leg: Tries to lift leg/unable to hold 3 seconds but remains standing independently Total Score: 48 Dynamic Gait Index Level Surface: Mild Impairment Change in Gait Speed: Normal Gait with Horizontal Head Turns: Mild Impairment Gait with Vertical Head Turns: Mild Impairment Gait and Pivot Turn: Normal Step Over Obstacle: Normal Step Around Obstacles: Normal Steps: Normal Total Score: 21 Timed Up and Go Test TUG: Normal TUG;Cognitive TUG Normal TUG (seconds): 12.07 Cognitive TUG (seconds): 19.5 Static Sitting Balance Static Sitting - Balance Support: No upper extremity supported;Feet supported Static Sitting - Level of Assistance: 6: Modified independent (Device/Increase time) Dynamic Sitting Balance Dynamic Sitting - Balance Support: Feet supported;No upper extremity supported Dynamic Sitting - Level of Assistance: 6: Modified independent (Device/Increase time) Sitting balance - Comments: reaching for objects, donning/doffing shoes Static Standing Balance Static Standing - Balance Support: No upper extremity supported Static Standing - Level of Assistance: 6: Modified independent (Device/Increase time) Static Standing - Comment/# of Minutes: x2 min during Berg Static Stance: Eyes closed Static Stance: Eyes Closed: x30 sec in Berg mod I Dynamic Standing Balance Dynamic Standing - Balance Support: During functional activity;No upper extremity supported Dynamic Standing - Level of Assistance: 6: Modified independent (Device/Increase time) Dynamic Standing - Balance Activities: Lateral lean/weight shifting;Forward lean/weight shifting Extremity Assessment  RUE Assessment RUE Assessment: Exceptions to [facility] (decreased strength, coordination) LUE Assessment LUE Assessment: Within Functional Limits RLE Assessment RLE Assessment: Within Functional Limits (4+/5 to 5/5 throughout) LLE Assessment LLE Assessment: Within Functional  Limits (grossly 4+/5 to 5/5 throughout)   Skilled Therapeutic Intervention: 1100-1200: Pt received seated in recliner with no c/o pain and agreeable to treatment. Assessed all outcome measures, mobility as described above in preparation for d/c home. Pt is mod I overall for daily activities. Does demonstrate balance impairments with higher level balance activities such as reduced BOS, eyes closed conditions, and with cognitive dual task. Instructed pt in level A and B [place] exercises for HEP to improve strength and balance. Pt able to follow handouts with directions and min cues for correct performance. Pt returned to room and remained seated in recliner with all needs in reach at completion of session.  [id number]: Pt received ambulating in room with son present; no c/o pain and agreeable to treatment. Educated pt and son in importance of using call bell system  prior to getting up while in rehab to reduce risk of falls; pt agreeable. Educated son and pt in HEP performance, appropriate progression of exercises, importance of daily activity such as a walking program. Pt instructed in gait for community distances including indoors/outdoors, inclines and variable surfaces, as well as ascent/descent of 4 flights of stairs all with modI. Pt did require min cues for attention to R hand on handrail while descending stairs. Pt returned to room and given handout with PT/OT recommendations. With pt's son preparing to leave, pt again reminded to use call bell system prior to getting up to move in room and pt agreeable. Pt remained seated in recliner with all needs in reach at completion of session. RN/NA alerted to pt impulsivity and pt awareness of use of call bell system.   See Function Navigator for Current Functional Status.  Benjiman Core Tygielski 12/25/2014, 12:14 PM

## 2014-12-25 NOTE — Discharge Summary (Signed)
Discharge summary job [id number]

## 2014-12-25 NOTE — Progress Notes (Signed)
Subjective/Complaints:  No new complaints. Excited to be going home. ROS limited by language     Objective: Vital Signs: Blood pressure 118/72, pulse 70, temperature 98 F (36.7 C), temperature source Oral, resp. rate 18, height  (1.803 m), weight 92.1 kg (203 lb 0.7 oz), SpO2 100 %. No results found. Results for orders placed or performed during the hospital encounter of 12/16/14 (from the past 72 hour(s))  Glucose, capillary     Status: Abnormal   Collection Time: 12/22/14 11:04 AM  Result Value Ref Range   Glucose-Capillary 272 (H) 65 - 99 mg/dL  Glucose, capillary     Status: Abnormal   Collection Time: 12/22/14  4:15 PM  Result Value Ref Range   Glucose-Capillary 177 (H) 65 - 99 mg/dL  Glucose, capillary     Status: Abnormal   Collection Time: 12/22/14  8:27 PM  Result Value Ref Range   Glucose-Capillary 262 (H) 65 - 99 mg/dL  Glucose, capillary     Status: Abnormal   Collection Time: 12/23/14  6:43 AM  Result Value Ref Range   Glucose-Capillary 193 (H) 65 - 99 mg/dL  Glucose, capillary     Status: Abnormal   Collection Time: 12/23/14 11:13 AM  Result Value Ref Range   Glucose-Capillary 296 (H) 65 - 99 mg/dL  Glucose, capillary     Status: Abnormal   Collection Time: 12/23/14  4:10 PM  Result Value Ref Range   Glucose-Capillary 172 (H) 65 - 99 mg/dL  Glucose, capillary     Status: Abnormal   Collection Time: 12/23/14  9:09 PM  Result Value Ref Range   Glucose-Capillary 209 (H) 65 - 99 mg/dL  Glucose, capillary     Status: Abnormal   Collection Time: 12/24/14  6:31 AM  Result Value Ref Range   Glucose-Capillary 150 (H) 65 - 99 mg/dL  Glucose, capillary     Status: Abnormal   Collection Time: 12/24/14 11:23 AM  Result Value Ref Range   Glucose-Capillary 314 (H) 65 - 99 mg/dL  Glucose, capillary     Status: Abnormal   Collection Time: 12/24/14  4:37 PM  Result Value Ref Range   Glucose-Capillary 150 (H) 65 - 99 mg/dL   Comment 1 Notify RN   Glucose,  capillary     Status: Abnormal   Collection Time: 12/24/14  8:51 PM  Result Value Ref Range   Glucose-Capillary 240 (H) 65 - 99 mg/dL  Glucose, capillary     Status: Abnormal   Collection Time: 12/25/14  6:41 AM  Result Value Ref Range   Glucose-Capillary 155 (H) 65 - 99 mg/dL      General: No acute distress Mood and affect are appropriate Eyes- prosthetic Left eye, 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 motor strength is 5/5 in bilateral deltoid, bicep, tricep, grip, hip flexor, knee extensors, ankle dorsiflexor and plantar flexor Sensory exam reduced  sensation to light touch and proprioception in right  upper , intact R lower extremity Cerebellar exam normal finger to nose to finger as well as  heel to shin in left  upper and lower extremities, moderate dysmetria on RIght Musculoskeletal: Full range of motion in all 4 extremities. No joint swelling   Assessment/Plan: 1. Functional deficits secondary to left posterior frontal intracranial hemorrhage felt to be secondary to hypertensive crisis with right sided ataxia and sensory deficits as well as aphasiawhich require 3+ hours per day of interdisciplinary therapy in a comprehensive inpatient rehab setting. Physiatrist is providing close team supervision and 24 hour management of active medical problems listed below. Physiatrist and rehab team continue to assess barriers to discharge/monitor patient progress toward functional and medical goals.   FIM: Function - Bathing Bathing activity did not occur: Refused Position: Shower Body parts  bathed by patient: Right arm, Left arm, Chest, Abdomen, Front perineal area, Buttocks, Right upper leg, Left upper leg, Right lower leg, Left lower leg, Back Body parts bathed by helper: Back Bathing not applicable: Back Assist Level: Supervision or verbal cues  Function- Upper Body Dressing/Undressing What is the patient wearing?: Pull over shirt/dress Pull over shirt/dress - Perfomed by patient: Thread/unthread right sleeve, Thread/unthread left sleeve, Put head through opening, Pull shirt over trunk Assist Level: More than reasonable time Set up : To obtain clothing/put away Function - Lower Body Dressing/Undressing What is the patient wearing?: Underwear, Pants, Socks, Shoes Position: Sitting EOB Underwear - Performed by patient: Pull underwear up/down, Thread/unthread right underwear leg, Thread/unthread left underwear leg Underwear - Performed by helper: Thread/unthread right underwear leg, Thread/unthread left underwear leg Pants- Performed by patient: Thread/unthread right pants leg, Thread/unthread left pants leg, Pull pants up/down Pants- Performed by helper: Thread/unthread left pants leg, Thread/unthread right pants leg Socks - Performed by patient: Don/doff right sock, Don/doff left sock Shoes - Performed by patient: Don/doff right shoe, Don/doff left shoe, Fasten right, Fasten left Shoes - Performed by helper: Fasten right, Fasten left Assist Level: More than reasonable time  Function - Toileting Toileting activity did not occur: Refused Toileting steps completed by patient: Adjust clothing prior to toileting, Performs perineal hygiene, Adjust clothing after toileting Toileting Assistive Devices: Grab bar or rail Assist level: More than reasonable time  Function - Archivist transfer activity did not occur:  (standing to void) Toilet transfer assistive device: Elevated toilet seat/BSC over toilet Assist level to toilet: Supervision or verbal cues Assist  level from toilet: Supervision or verbal cues Assist level to bedside commode (at bedside): Touching or steadying assistance (Pt > 75%) Assist level from bedside commode (at bedside): Touching or steadying assistance (Pt > 75%)  Function - Chair/bed transfer Chair/bed transfer method: Ambulatory, Stand pivot Chair/bed transfer assist level: Supervision or verbal cues Chair/bed transfer assistive device: Armrests Chair/bed transfer details: Verbal cues for precautions/safety  Function - Locomotion: Wheelchair Will patient use wheelchair at discharge?: No Type: Manual Max wheelchair distance: 100 Assist Level: Touching or steadying assistance (Pt > 75%) Assist Level: Touching or steadying assistance (Pt > 75%) Assist Level: Touching or steadying assistance (Pt > 75%) Turns around,maneuvers to table,bed, and toilet,negotiates 3% grade,maneuvers on rugs and over doorsills: No Function - Locomotion: Ambulation Assistive device: No device Max distance: 150 Assist level: Supervision or verbal cues Assist level: Supervision or verbal cues Assist level: Supervision or verbal cues Assist level: Supervision or verbal cues Assist level: Touching or steadying assistance (Pt > 75%)  Function - Comprehension Comprehension: Auditory Comprehension assist level: Follows complex conversation/direction with extra time/assistive device  Function - Expression Expression: Verbal Expression assist level: Expresses basic 50 - 74% of the time/requires  cueing 25 - 49% of the time. Needs to repeat parts of sentences.  Function - Social Interaction Social Interaction assist level: Interacts appropriately with others with medication or extra time (anti-anxiety, antidepressant).  Function - Problem Solving Problem solving assist level: Solves basic 90% of the time/requires cueing < 10% of the time  Function - Memory Memory assist level: Recognizes or recalls 75 - 89% of the time/requires cueing 10 - 24%  of the time Patient normally able to recall (first 3 days only): That he or she is in a hospital, Current season, Location of own room, Staff names and faces Medical Problem List and Plan: 1. Functional deficits secondary to left posterior frontal intracranial hemorrhage felt to be secondary to hypertensive crisis, severe sensory deficits RUE>> RLE  -home tomorrow 2.  DVT Prophylaxis/Anticoagulation: Subcutaneous  lovenox 3. Pain Management: Tylenol as needed 4. Acute respiratory failure. . Comfortable/no distress currently 5. Neuropsych: This patient is capable of making decisions on his own behalf. 6. Skin/Wound Care: Routine skin checks 7. Fluids/Electrolytes/Nutrition: Routine I&O, eating 100% meals 8. Hypertension. Norvasc 10 mg daily, Avapro 300 mg daily, hydrochlorothiazide 25 mg daily. Monitor with increased mobility---138/82 this am 9. Diabetes mellitus of peripheral neuropathy. Hemoglobin A1c 7.5.              -CBG's still high---increase lantus to 30U qhs--(?am dose) 10. History of alcohol use. Provide counseling.  no signs of withdrawal, do not anticipate any WD symptoms at this point 11. Hyperlipidemia. Lipitor 12.  Hypokalemia improved on KCL,  13.  Constipation-senna S bid;daily BM  LOS (Days) 9 A FACE TO FACE EVALUATION WAS PERFORMED  SWARTZ,[name] 12/25/2014, 10:14 AM

## 2014-12-25 NOTE — Discharge Summary (Signed)
Richard [name], Richard [name]                 ACCOUNT NO.:  [id number]  MEDICAL RECORD NO.:  [id number]  LOCATION:  4M10C                        FACILITY:  [facility]  PHYSICIAN:  Erick Colace, M.D.DATE OF BIRTH:  12/07/1945  DATE OF ADMISSION:  12/16/2014 DATE OF DISCHARGE:                              DISCHARGE SUMMARY   DISCHARGE DIAGNOSES: 1. Functional deficits secondary to left posterior frontal     intracranial hemorrhage, felt to be secondary to hypertensive     crisis. 2. Subcutaneous Lovenox for deep vein thrombosis prophylaxis. 3. Acute respiratory failure. 4. Hypertension. 5. Diabetes mellitus, peripheral neuropathy. 6. Hyperlipidemia. 7. Hypokalemia, resolved. 8. Constipation, resolved.  HISTORY OF PRESENT ILLNESS:  This is a 69 year old right-handed male with history of alcohol abuse, diabetes mellitus, artificial left eye, CVA with little residual.  By report, he lives with his wife, independent prior to admission.  Presented on December 10, 2014, with aphasia and right-sided weakness.  No recent head trauma.  No use of anticoagulation or antiplatelets.  Systolic blood pressure greater than 200.  CT of the brain showed evidence of lobar left frontal ICH, measuring 3.5 x 2.5 cm with mild surrounding vasogenic edema.  No hydrocephalus.  MRI of the brain showed acute left posterior frontal intracerebral hemorrhage, most likely hypertensive bleed.  No suggestion of vascular malformation.  The patient did require intubation for short time due to acute respiratory failure, extubated on December 14, 2014. Echocardiogram with ejection fraction 65%, grade 1 diastolic dysfunction.  Carotid Dopplers with no ICA stenosis.  EEG showed generalized slowing, consistent with encephalopathy.  No seizure. Tolerating a regular consistency diet.  Close monitoring of blood pressure.  Subcutaneous heparin initiated for DVT prophylaxis on December 12, 2014.  Physical and occupational therapy  ongoing.  The patient was admitted for comprehensive rehab program.  PAST MEDICAL HISTORY:  See discharge diagnoses.  SOCIAL HISTORY:  Lives with spouse, reported to be independent prior to admission.  Functional status upon admission to rehab services was min- to-mod assist, ambulate 300 feet rolling walker, min-to-mod assist sit to stand, min-to-mod assist activities of daily living.  PHYSICAL EXAMINATION:  VITAL SIGNS:  Blood pressure 161/86, pulse 95, temperature 97, respirations 18. GENERAL:  This was an alert male, right facial weakness.  Oriented to self, place.  He could recall the month, giving cues.  Some delaying comprehension and expression. LUNGS:  Clear to auscultation. CARDIAC:  Regular rate and rhythm. ABDOMEN:  Soft, nontender.  Good bowel sounds.  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  REHABILITATION HOSPITAL COURSE:  The patient was admitted to inpatient rehab services with therapies initiated on a 3-hour daily basis, consisting of physical therapy, occupational therapy, speech therapy, and rehabilitation nursing.  The following issues were addressed during the patient's rehabilitation stay.  Pertaining to Mr. Scribner functional deficits secondary to left posterior frontal intracranial hemorrhage, felt to be secondary to hypertensive crisis, remained stable.  The patient would follow up with Neurology Services.  Blood pressures controlled and monitored.  Subcutaneous Lovenox for DVT prophylaxis.  No bleeding episodes.  The patient's hospital course, acute respiratory failure.  He was extubated on December 14, 2014, without difficulty.  Blood pressures  controlled on Norvasc, Avapro, hydrochlorothiazide.  He would follow up with his primary MD.  No orthostasis noted.  Diabetes mellitus, peripheral neuropathy, hemoglobin A1c 7.5, blood sugars overall monitored closely on Lantus insulin with full diabetic teaching.  There was a documented history of alcohol abuse.   The patient exhibited no signs of alcohol withdrawal, receiving full counseling in regard to cessation of alcohol.  He would continue on his Lipitor for hyperlipidemia.  The patient received weekly collaborative interdisciplinary team conferences to discuss estimated length of stay, family teaching, and any barriers to his discharge. Focus sessions on car transfers, demonstrating safe car entry, exit, and simulated car modified independence, no verbal cues.  Also, educated the patient on floor furniture transfers, discussed indications for calling EMS as needed.  Engaged and dual task dynamic gait and balance activities with cues to ambulate forward and backwards while tossing and balancing a ball.  He was able to gather his belongings for activities of daily living and homemaking, ambulate to the bathroom without assistive device supervision, required some cues for safety and shower as he was somewhat impulsive, this was discussed with family the need for supervision.  All family teaching was completed and plan discharge home.  DISCHARGE MEDICATIONS: 1. Norvasc 10 mg p.o. daily. 2. Lipitor 10 mg p.o. daily. 3. Folic acid 1 mg p.o. daily. 4. Lantus insulin 24 units at bedtime. 5. Avapro 300 mg p.o. daily. 6. Hydrochlorothiazide 25 mg p.o. daily. 7. Multivitamin daily. 8. Senokot-S 2 tablets b.i.d., hold for loose stools. 9. Vitamin B12 100 mcg p.o. daily.  DIET:  Diabetic diet.  FOLLOWUP:  The patient would follow up with Dr. Claudette Laws at the Outpatient Rehab Service office, to call for appointment; Dr. Delia Heady, 1 month, call for appointment; and follow up with primary care doctor, Scott L. Link Snuffer, M.D., medical management.     Richard [name], P.A.   ______________________________ Erick Colace, M.D.    DA/MEDQ  D:  12/25/2014  T:  12/25/2014  Job:  [id number]  cc:   Lorin Picket L. Link Snuffer, M.D. Pramod P. Pearlean Brownie, MD Erick Colace, M.D.

## 2014-12-25 NOTE — Progress Notes (Signed)
Speech Language Pathology Discharge Summary  Patient Details  Name: Richard [name] MRN: [id number] Date of Birth: 1945-04-29  Today's Date: 12/25/2014 SLP Individual Time: 0903-1000 SLP Individual Time Calculation (min): 57 min   Skilled Therapeutic Interventions:  Pt was seen for skilled ST targeting cognitive goals.  Upon arrival, pt was seated upright in wheelchair, awake, alert, and agreeable to participate in [place].  Pt required x1 min assist verbal cue to apply brakes to wheelchair before standing up due to impulsivity.  Pt ambulated to ST treatment room with supervision cues for route recall and safety awareness.  Pt utilized compensatory word finding strategies during a structured barrier task to describe actions in pictures with extra time and overall supervision cues.  Pt demonstrated good awareness of errors and was able to correct paraphasias with supervision and extra time in ~80% of opportunities; however, he intermittently required min assist verbal cues when he was unable to correct verbal errors.  Pt was also able to describe problems in pictures with mod faded to min assist verbal cues due to the same abovementioned reasons.  SLP reviewed and reinforced compensatory strategies for functional communication, emphasizing use of short phrases to convey needs and wants to caregivers as pt continues to demonstrate breakdown for word finding at the sentence/conversational level.  SLP also emphasized substituting words with similar meanings for a word he is struggling to say.  Pt verbalized understanding of education and was able to return demonstration of communication techniques during both structured and unstructured tasks with supervision.  Pt was left in room in recliner with nursing present.  Pt is on track for discharge tomorrow.      Patient has met 7 of 8 long term goals.  Patient to discharge at overall Supervision level.  Reasons goals not met:  Pt intermittently requires min assist to  verbally convey needs and wants  Clinical Impression/Discharge Summary:  Pt made functional gains while inpatient and is discharging having met 7 out of 8 long term goals.  Pt can utilize compensatory strategies and recognize and correct errors with supervision but intermittently requires additional support (min assist) to verbalize needs and wants due to persistent word finding deficits. Pt requires supervision during basic, familiar cognitive tasks due to impulsivity and decreased recall of new information.  Pt is consuming regular textures and thin liquids with mod I use of swallowing precautions.  Pt is discharging home with 24/7 supervision from family.  Recommend ST follow up at next level of care to continue to address cognitive-linguistic impairments.  Pt and family education is complete at this time.    Care Partner:  Caregiver Able to Provide Assistance: Yes  Type of Caregiver Assistance: Physical;Cognitive  Recommendation:  24 hour supervision/assistance;Outpatient SLP;Home Health SLP  Rationale for SLP Follow Up: Maximize functional communication;Maximize cognitive function and independence;Reduce caregiver burden   Equipment: none recommended by SLP    Reasons for discharge: Discharged from hospital   Patient/Family Agrees with Progress Made and Goals Achieved: Yes   Function:  Eating Eating                 Cognition Comprehension Comprehension assist level: Follows complex conversation/direction with extra time/assistive device  Expression   Expression assist level: Expresses basic 75 - 89% of the time/requires cueing 10 - 24% of the time. Needs helper to occlude trach/needs to repeat words.  Social Interaction Social Interaction assist level: Interacts appropriately 90% of the time - Needs monitoring or encouragement for participation or interaction.  Problem  Solving Problem solving assist level: Solves basic 90% of the time/requires cueing < 10% of the time  Memory  Memory assist level: Recognizes or recalls 90% of the time/requires cueing < 10% of the time   Windell Moulding L 12/25/2014, 11:35 AM

## 2014-12-25 NOTE — Progress Notes (Signed)
Occupational Therapy Session Note  Patient Details  Name: Richard [name] MRN: [id number] Date of Birth: 1946/03/05  Today's Date: 12/25/2014 OT Individual Time: 1000-1100 OT Individual Time Calculation (min): 60 min    Short Term Goals: Week 1:  OT Short Term Goal 1 (Week 1): Pt will complete bathing at sit > stand level in shower with supevision OT Short Term Goal 2 (Week 1): Pt will complete LB dressing with min assist OT Short Term Goal 3 (Week 1): Pt will complete 2 grooming tasks in standing with supervision OT Short Term Goal 4 (Week 1): Pt will complete toilet transfers with supervision with LRAD OT Short Term Goal 5 (Week 1): Pt will utilize RUE as nondominant UE during self-care tasks  Skilled Therapeutic Interventions/Progress Updates:    Treatment session with focus on functional mobility and use of dominant RUE.  Pt reports already dressing this AM without assist and requesting to focus on RUE.  Pt ambulated to therapy gym without AD with no LOB.  Engaged in dynamic standing balance with retrieving items from high and low surfaces with use of RUE.  Reiterated importance of visual attention to RUE during tasks to increase success due to impaired sensation and proprioception. Utilized resistive clothespins with pt requiring increased time to grasp each clothespin and difficulty with motor planning movements to secure clothespin on vertical dowel.  Attempted 9 hole peg test with pt unable to complete Rt side due to impaired sensation and proprioception.  Pt returned to room carrying cup half filled with water to increase attention to Rt hand, pt with no spilling.  Provided HEP handout to continue to address RUE [facility].  Therapy Documentation Precautions:  Precautions Precautions: Fall Restrictions Weight Bearing Restrictions: No General:   Vital Signs: Therapy Vitals Temp: 98.5 F (36.9 C) Temp Source: Oral Pulse Rate: 90 Resp: 18 BP: 119/72 mmHg Patient Position (if  appropriate): Sitting Oxygen Therapy SpO2: 100 % O2 Device: Not Delivered Pain: Pain Assessment Pain Assessment: No/denies pain Pain Score: 0-No pain  See Function Navigator for Current Functional Status.   Therapy/Group: Individual Therapy  Rosalio Loud 12/25/2014, 3:20 PM

## 2014-12-25 NOTE — Progress Notes (Signed)
Social Work Patient ID: Richard [name], male   DOB: 1945/07/23, 69 y.o.   MRN: [id number] Spoke with wife informed her attempted to contact yesterday and the home phone was disconnected, she responded it is working now. She wanted to know if he could have exercise equipment to take home with him For instance a musical glove so he will get the movement in his right hand back. I will ask team to give pt a home exercise program so he will be working on his recovery when the home health therapies are not there. Wife wanted to know if a home eval would be done, this is not necessary due to how high level pt is, not using an assistive device. Discussed PA will go over his discharge instructions tomorrow and if she has questions  Can contact this worker once home. Informed her was trying to get her their son to come in for family education but without success, never returned my phone call.

## 2014-12-26 LAB — GLUCOSE, CAPILLARY
GLUCOSE-CAPILLARY: 96 mg/dL (ref 65–99)
Glucose-Capillary: 69 mg/dL (ref 65–99)

## 2014-12-26 MED ORDER — INSULIN GLARGINE 100 UNIT/ML ~~LOC~~ SOLN: 30.0000 [IU] | Freq: Every day | SUBCUTANEOUS | Status: DC

## 2014-12-26 MED ORDER — ADULT MULTIVITAMIN W/MINERALS CH: 1.0000 | ORAL_TABLET | Freq: Every day | ORAL | Status: AC

## 2014-12-26 MED ORDER — OLMESARTAN MEDOXOMIL-HCTZ 40-25 MG PO TABS: 1.0000 | ORAL_TABLET | Freq: Every day | ORAL | Status: DC

## 2014-12-26 MED ORDER — AMLODIPINE BESYLATE 10 MG PO TABS: 10.0000 mg | ORAL_TABLET | Freq: Every day | ORAL | Status: AC

## 2014-12-26 MED ORDER — ATORVASTATIN CALCIUM 10 MG PO TABS: 10.0000 mg | ORAL_TABLET | Freq: Every day | ORAL | Status: DC

## 2014-12-26 MED ORDER — CYANOCOBALAMIN 100 MCG PO TABS: 100.0000 ug | ORAL_TABLET | Freq: Every day | ORAL | Status: DC

## 2014-12-26 MED ORDER — FOLIC ACID 1 MG PO TABS: 1.0000 mg | ORAL_TABLET | Freq: Every day | ORAL | Status: DC

## 2014-12-26 NOTE — Progress Notes (Signed)
Social Work Discharge Note  The overall goal for the admission was met for:   Discharge location: [place]  Length of Stay: Yes-10 DAYS  Discharge activity level: Yes-SUPERVISION/MOD/I LEVEL  Home/community participation: Yes  Services provided included: MD, RD, PT, OT, SLP, RN, CM, TR, Pharmacy and SW  Financial Services: Private Insurance: [place] [employer] [place]  Follow-up services arranged: Home Health: [facility]-PT,OT,RN,SP, DME: [facility] and Patient/Family has no preference for HH/DME agencies  Comments (or additional information):[facility] [place] PT, NOT WIFE HOW IS DISABLED AND HOMEBOUND-SPOKE WITH HER BY PHONE TO ANSWER HER QUESTIONS. DECLINED SUBSTANCE ABUSE SERVICES-FELT NOT NEEDED.   Patient/Family verbalized understanding of follow-up arrangements: Yes  Individual responsible for coordination of the follow-up plan: LINDA-WIFE AND BASIL-SON  Confirmed correct DME delivered: Elease Hashimoto 12/26/2014    Elease Hashimoto

## 2014-12-26 NOTE — Progress Notes (Signed)
Patient and son received discharge instructions from Dan Angiulli, PA-C with verbal understanding. Patient discharged to home with son and belongings. 

## 2014-12-26 NOTE — Progress Notes (Signed)
Subjective/Complaints: Asymptomatic with borderline low  CBG today    No new complaints. Excited to be going home. ROS limited by language     Objective: Vital Signs: Blood pressure 111/74, pulse 78, temperature 98.2 F (36.8 C), temperature source Oral, resp. rate 16, height  (1.803 m), weight 92.1 kg (203 lb 0.7 oz), SpO2 99 %. No results found. Results for orders placed or performed during the hospital encounter of 12/16/14 (from the past 72 hour(s))  Glucose, capillary     Status: Abnormal   Collection Time: 12/23/14 11:13 AM  Result Value Ref Range   Glucose-Capillary 296 (H) 65 - 99 mg/dL  Glucose, capillary     Status: Abnormal   Collection Time: 12/23/14  4:10 PM  Result Value Ref Range   Glucose-Capillary 172 (H) 65 - 99 mg/dL  Glucose, capillary     Status: Abnormal   Collection Time: 12/23/14  9:09 PM  Result Value Ref Range   Glucose-Capillary 209 (H) 65 - 99 mg/dL  Glucose, capillary     Status: Abnormal   Collection Time: 12/24/14  6:31 AM  Result Value Ref Range   Glucose-Capillary 150 (H) 65 - 99 mg/dL  Glucose, capillary     Status: Abnormal   Collection Time: 12/24/14 11:23 AM  Result Value Ref Range   Glucose-Capillary 314 (H) 65 - 99 mg/dL  Glucose, capillary     Status: Abnormal   Collection Time: 12/24/14  4:37 PM  Result Value Ref Range   Glucose-Capillary 150 (H) 65 - 99 mg/dL   Comment 1 Notify RN   Glucose, capillary     Status: Abnormal   Collection Time: 12/24/14  8:51 PM  Result Value Ref Range   Glucose-Capillary 240 (H) 65 - 99 mg/dL  Glucose, capillary     Status: Abnormal   Collection Time: 12/25/14  6:41 AM  Result Value Ref Range   Glucose-Capillary 155 (H) 65 - 99 mg/dL  Glucose, capillary     Status: Abnormal   Collection Time: 12/25/14 11:57 AM  Result Value Ref Range   Glucose-Capillary 224 (H) 65 - 99 mg/dL  Glucose, capillary     Status: Abnormal   Collection Time: 12/25/14  4:25 PM  Result Value Ref Range   Glucose-Capillary 176 (H) 65 - 99 mg/dL  Glucose, capillary     Status: Abnormal   Collection Time: 12/25/14  9:23 PM  Result Value Ref Range   Glucose-Capillary 128 (H) 65 - 99 mg/dL   Comment 1 Notify RN   Glucose, capillary     Status: None   Collection Time: 12/26/14  6:46 AM  Result Value Ref Range   Glucose-Capillary 69 65 - 99 mg/dL  Glucose, capillary     Status: None   Collection Time: 12/26/14  7:04 AM  Result Value Ref Range   Glucose-Capillary 96 65 - 99 mg/dL      General: No acute distress Mood and affect are appropriate Eyes- prosthetic Left eye, 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 motor strength is 5/5 in  bilateral deltoid, bicep, tricep, grip, hip flexor, knee extensors, ankle dorsiflexor and plantar flexor Sensory exam reduced  sensation to light touch and proprioception in right  upper , intact R lower extremity  Musculoskeletal: Full range of motion in all 4 extremities. No joint swelling   Assessment/Plan: 1. Functional deficits secondary to left posterior frontal intracranial hemorrhage felt to be secondary to hypertensive crisis with right sided ataxia and sensory deficits as well as aphasia Stable for D/C today F/u PCP in 1-2 weeks F/u PM&R 3 weeks See D/C summary See D/C instructions No driving FIM: Function - Bathing Bathing activity did not occur: Refused Position: Shower Body parts bathed by patient: Right arm, Left arm, Chest, Abdomen, Front perineal area, Buttocks, Right upper leg, Left upper leg, Right lower leg, Left lower leg, Back Body  parts bathed by helper: Back Bathing not applicable: Back Assist Level: Supervision or verbal cues  Function- Upper Body Dressing/Undressing What is the patient wearing?: Pull over shirt/dress Pull over shirt/dress - Perfomed by patient: Thread/unthread right sleeve, Thread/unthread left sleeve, Put head through opening, Pull shirt over trunk Assist Level: More than reasonable time Set up : To obtain clothing/put away Function - Lower Body Dressing/Undressing What is the patient wearing?: Underwear, Pants, Socks, Shoes Position: Sitting EOB Underwear - Performed by patient: Pull underwear up/down, Thread/unthread right underwear leg, Thread/unthread left underwear leg Underwear - Performed by helper: Thread/unthread right underwear leg, Thread/unthread left underwear leg Pants- Performed by patient: Thread/unthread right pants leg, Thread/unthread left pants leg, Pull pants up/down Pants- Performed by helper: Thread/unthread left pants leg, Thread/unthread right pants leg Socks - Performed by patient: Don/doff right sock, Don/doff left sock Shoes - Performed by patient: Don/doff right shoe, Don/doff left shoe, Fasten right, Fasten left Shoes - Performed by helper: Fasten right, Fasten left Assist Level: More than reasonable time  Function - Toileting Toileting activity did not occur: Refused Toileting steps completed by patient: Adjust clothing prior to toileting, Performs perineal hygiene, Adjust clothing after toileting Toileting Assistive Devices: Grab bar or rail Assist level: Supervision or verbal cues  Function - Archivist transfer activity did not occur:  (standing to void) Toilet transfer assistive device: Elevated toilet seat/BSC over toilet Assist level to toilet: Supervision or verbal cues Assist level from toilet: Supervision or verbal cues Assist level to bedside commode (at bedside): Touching or steadying assistance (Pt > 75%) Assist level from bedside  commode (at bedside): Touching or steadying assistance (Pt > 75%)  Function - Chair/bed transfer Chair/bed transfer method: Ambulatory Chair/bed transfer assist level: No Help, no cues, assistive device, takes more than a reasonable amount of time Chair/bed transfer assistive device: Armrests Chair/bed transfer details: Verbal cues for precautions/safety  Function - Locomotion: Wheelchair Will patient use wheelchair at discharge?: No Type: Manual Max wheelchair distance: 100 Assist Level: Touching or steadying assistance (Pt > 75%) Assist Level: Touching or steadying assistance (Pt > 75%) Assist Level: Touching or steadying assistance (Pt > 75%) Turns around,maneuvers to table,bed, and toilet,negotiates 3% grade,maneuvers on rugs and over doorsills: No Function - Locomotion: Ambulation Assistive device: No device Max distance: 250 Assist level: No help, No cues, assistive device, takes more than a reasonable amount of time Assist level: No help, No cues, assistive device, takes more than a reasonable amount of time Assist level: No help, No cues, assistive device, takes more than a reasonable amount of time Assist level: No help, No cues, assistive device, takes more than a reasonable amount of time Assist level:  No help, No cues, assistive device, takes more than a reasonable amount of time  Function - Comprehension Comprehension: Auditory Comprehension assist level: Follows complex conversation/direction with extra time/assistive device  Function - Expression Expression: Verbal Expression assist level: Expresses basic 75 - 89% of the time/requires cueing 10 - 24% of the time. Needs helper to occlude trach/needs to repeat words.  Function - Social Interaction Social Interaction assist level: Interacts appropriately 90% of the time - Needs monitoring or encouragement for participation or interaction.  Function - Problem Solving Problem solving assist level: Solves basic 90% of  the time/requires cueing < 10% of the time  Function - Memory Memory assist level: Recognizes or recalls 90% of the time/requires cueing < 10% of the time Patient normally able to recall (first 3 days only): That he or she is in a hospital, Current season, Location of own room, Staff names and faces Medical Problem List and Plan: 1. Functional deficits secondary to left posterior frontal intracranial hemorrhage felt to be secondary to hypertensive crisis, severe sensory deficits RUE>> RLE  -home tomorrow 2.  DVT Prophylaxis/Anticoagulation: Subcutaneous  lovenox 3. Pain Management: Tylenol as needed 4. Acute respiratory failure. . Comfortable/no distress currently 5. Neuropsych: This patient is capable of making decisions on his own behalf. 6. Skin/Wound Care: Routine skin checks 7. Fluids/Electrolytes/Nutrition: Routine I&O, eating 100% meals 8. Hypertension. Norvasc 10 mg daily, Avapro 300 mg daily, hydrochlorothiazide 25 mg daily. Monitor with increased mobility---111/74 this am 9. Diabetes mellitus of peripheral neuropathy. Hemoglobin A1c 7.5.              -CBG's still high---increase lantus to 30U qhs-- 10. History of alcohol use. Provide counseling.  no signs of withdrawal, discussed rec for no ETOH 11. Hyperlipidemia. Lipitor 12.  Hypokalemia improved on KCL,  13.  Constipation-senna S bid;daily BM  LOS (Days) 10 A FACE TO FACE EVALUATION WAS PERFORMED  KIRSTEINS,ANDREW E 12/26/2014, 7:30 AM

## 2014-12-26 NOTE — Progress Notes (Signed)
Hypoglycemic Event  CBG: 69 @ 0646  Treatment: 15 GM carbohydrate snack  Symptoms: None  Follow-up CBG: Time:0704 CBG Result:96  Possible Reasons for Event: Unknown; pt HS CBG 128, ate HS snack; asymptomatic.   Comments/MD notified: Dr. Wynn Banker @ [phone] on floor with 55 CBG, followed up with MD @ 0706 with 96 CBG. No new orders received at that time.  Dayshift RN aware of situation, will continue to monitor patient.     Scarlette Slice K  Remember to initiate Hypoglycemia Order Set & complete

## 2015-01-09 ENCOUNTER — Encounter: Payer: Self-pay | Admitting: Physical Medicine & Rehabilitation

## 2015-01-12 ENCOUNTER — Telehealth: Payer: Self-pay | Admitting: Physical Medicine & Rehabilitation

## 2015-01-12 NOTE — Telephone Encounter (Signed)
Allen DerryAllison Mallory ST with [facility] needs a referral for outpatient ST and OT starting week of 10/31 at [facility].  If you have any questions please call her at [phone].

## 2015-01-12 NOTE — Telephone Encounter (Signed)
Barbara CowerJason, Can you handle this? PLEASE

## 2015-01-15 ENCOUNTER — Other Ambulatory Visit: Payer: Self-pay

## 2015-01-15 DIAGNOSIS — R209 Unspecified disturbances of skin sensation: Secondary | ICD-10-CM

## 2015-01-15 DIAGNOSIS — I69398 Other sequelae of cerebral infarction: Secondary | ICD-10-CM

## 2015-01-15 DIAGNOSIS — I69393 Ataxia following cerebral infarction: Secondary | ICD-10-CM

## 2015-01-19 ENCOUNTER — Telehealth: Payer: Self-pay | Admitting: Physical Medicine & Rehabilitation

## 2015-01-19 NOTE — Telephone Encounter (Signed)
Revonda StandardAllison ST with [facility] called to let us know that the referral that was sent to outpatient was not specific.  It needs to state that patient will need OT and ST.  Any questions please call her at [phone].

## 2015-01-20 NOTE — Telephone Encounter (Signed)
It was fixed yesterday at 4:15pm. Thanks

## 2015-01-22 ENCOUNTER — Ambulatory Visit: Payer: Medicare HMO | Attending: Physical Medicine & Rehabilitation | Admitting: Occupational Therapy

## 2015-01-22 ENCOUNTER — Encounter: Payer: Self-pay | Admitting: Occupational Therapy

## 2015-01-22 DIAGNOSIS — I69319 Unspecified symptoms and signs involving cognitive functions following cerebral infarction: Secondary | ICD-10-CM | POA: Diagnosis present

## 2015-01-22 DIAGNOSIS — I69351 Hemiplegia and hemiparesis following cerebral infarction affecting right dominant side: Secondary | ICD-10-CM | POA: Insufficient documentation

## 2015-01-22 DIAGNOSIS — I69320 Aphasia following cerebral infarction: Secondary | ICD-10-CM | POA: Diagnosis present

## 2015-01-22 DIAGNOSIS — R270 Ataxia, unspecified: Secondary | ICD-10-CM | POA: Diagnosis present

## 2015-01-22 DIAGNOSIS — R279 Unspecified lack of coordination: Secondary | ICD-10-CM | POA: Diagnosis present

## 2015-01-22 DIAGNOSIS — H539 Unspecified visual disturbance: Secondary | ICD-10-CM | POA: Diagnosis present

## 2015-01-22 DIAGNOSIS — R278 Other lack of coordination: Secondary | ICD-10-CM

## 2015-01-22 NOTE — Therapy (Signed)
[facility] [address] [place], [place], [address] Phone: [phone]   Fax:  [phone]  Occupational Therapy Evaluation  Patient Details  Name: Richard [name] MRN: [id number] Date of Birth: November 27, 1945 Referring Provider: Dr. Claudette Laws  Encounter Date: 01/22/2015      OT End of Session - 01/22/15 1754    Visit Number 1   Number of Visits 17   Date for OT Re-Evaluation 03/23/15   Authorization Type Aetna, no visit limit, no auth, ?G-code   Authorization - Visit Number 1   Authorization - Number of Visits 10   OT Start Time 1317   OT Stop Time 1402   OT Time Calculation (min) 45 min   Activity Tolerance Patient tolerated treatment well   Behavior During Therapy --  easily frustrated      Past Medical History  Diagnosis Date  . Diabetes mellitus without complication (HCC)   . Stroke [facility])     History reviewed. No pertinent past surgical history.  There were no vitals filed for this visit.  Visit Diagnosis:  Hemiplegia and hemiparesis following cerebral infarction affecting right dominant side (HCC)  Decreased coordination  Visual disturbance  Cognitive deficits following cerebral infarction  Ataxia      Subjective Assessment - 01/22/15 1323    Subjective  "Everything 's different"   Patient is accompained by: Family member  son   Patient Stated Goals improve RUE functional use   Currently in Pain? Yes   Pain Score 2   up to 8-9/10   Pain Location Hand   Pain Orientation Right   Pain Descriptors / Indicators Tingling;Pins and needles;Aching   Pain Frequency Intermittent   Aggravating Factors  unable to tell   Pain Relieving Factors unable to tell   Effect of Pain on Daily Activities affects R hand use.  OT will monitor as relates to therapy, but will not directly address due to nature.           [facility] OT Assessment - 01/22/15 0001    Assessment   Diagnosis CVA   Referring  Provider Dr. Claudette Laws   Onset Date 12/10/14   Assessment hospital d/c 12/26/14   Prior Therapy OT, PT, ST   Precautions   Precautions --  no driving    Balance Screen   Has the patient fallen in the past 6 months No   Home  Environment   Family/patient expects to be discharged to: Private residence   Lives With Spouse  and son, sister-in-law   Prior Function   Level of Independence Independent with basic ADLs;Independent with homemaking with ambulation   Vocation Retired   [employer] worked for [employer]   ADL   Eating/Feeding Needs assist with cutting food  using RUE 20% of time per pt   Grooming --  brushing teeth 50% with RUE, son assists with shaving   Upper Body Bathing Minimal assistance  for drying off only   Lower Body Bathing Modified independent   Upper Body Dressing Needs assist for fasteners  occasionally    Lower Body Dressing Needs assist for fasteners  occasionally   Toilet Tranfer Modified independent   Toileting - Clothing Manipulation Modified independent   Tub/Shower Transfer Modified independent   IADL   Prior Level of Function Light Housekeeping pt performed some tasks prior   Light Housekeeping --  not perfoming   Prior Level of Function Meal Prep pt performed some   Meal Prep --  pt has not attempted   Prior Level of Function Community Mobility drove previously   [employer] --  not driving now   Medication Management Is responsible for taking medication in correct dosages at correct time   Prior Level of Function Financial Management pt performed   Financial Management --  wife performing   Mobility   Mobility Status Independent   Written Expression   Dominant Hand Right   Handwriting --  20% legible with name   Vision - History   Baseline Vision Bifocals   Visual History --  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)   Patient Visual Report --  sees flashes in R visual field randomly   Vision Assessment    Eye Alignment --   Ocular Range of Motion Within Functional Limits  min difficulty following directions   Tracking/Visual Pursuits Able to track stimulus in all quads without difficulty  min difficulty following directions   Visual Fields No apparent deficits   Acuity --  pt denies changes   Comment will assess further in functional context prn   Cognition   Overall Cognitive Status Cognition to be further assessed in functional context PRN  son/pt report only word finding/speech based issues   Difficult to assess due to Impaired communication  due to aphasia, difficulty following directions at times   Awareness Impaired   Awareness Impairment Anticipatory impairment  safety awareness   Behaviors --  easily frustrated   Sensation   Light Touch Impaired by gross assessment  with touch and with absence of touch   Proprioception Impaired by gross assessment   Coordination   Gross Motor Movements are Fluid and Coordinated No   Fine Motor Movements are Fluid and Coordinated No   Coordination and Movement Description ataxia    9 Hole Peg Test Right;Left   Right 9 Hole Peg Test only able to place 5 pegs in within 2 min   Box and Blocks R-28blocks, L-41blocks   ROM / Strength   AROM / PROM / Strength AROM;Strength   AROM   Overall AROM  Within functional limits for tasks performed   Strength   Overall Strength Comments bilateral UE proximal strength grossly 5/5   Hand Function   Right Hand Grip (lbs) 56   Left Hand Grip (lbs) 77                           OT Short Term Goals - 01/22/15 1802    OT SHORT TERM GOAL #1   Title Pt will be independent with initial HEP.--check STGs 02/21/16   Time 4   Period Weeks   Status New   OT SHORT TERM GOAL #2   Title Pt will demo improved RUE coordination as shown by improving score on box and blocks test by at least 8.   Baseline R-28 blocks, L-41 blocks   Time 4   Period Weeks   Status New   OT SHORT TERM GOAL #3    Title Pt will demo improved R grip strength by at least 8lbs to assist in lifting/gripping for ADLs.   Baseline R-56lbs, L-77lbs   Time 4   Period Weeks   Status New   OT SHORT TERM GOAL #4   Title Pt will eat with RUE at least 50% of the time.   Baseline approx 20%   Time 4   Period Weeks   Status New   OT SHORT TERM GOAL #5  Title Pt will write name with at least 75% legibility.   Baseline 20%   Time 4   Period Weeks   Status New           OT Long Term Goals - 21-Feb-2015 1805    OT LONG TERM GOAL #1   Title Pt will be independent with updated HEP.--check LTGs 03/23/15   Time 8   Period Weeks   Status New   OT LONG TERM GOAL #2   Title Pt will improve RUE coordination for ADLs as shown by completing 9-hole peg test in 90sec or less.   Baseline only placed 5 pegs in within 2 min   Time 8   Period Weeks   Status New   OT LONG TERM GOAL #3   Title Pt will perform simple cooking/cleaning task mod I.   Baseline not performing   Time 8   Period Weeks   Status New   OT LONG TERM GOAL #4   Title Pt will use dominant RUE for brushing teeth/eating at least 90% of the time.   Time 8   Period Weeks   Status New   OT LONG TERM GOAL #5   Title Pt will be able to complete clothing fasteners and cut food using AE prn.   Time 8   Period Weeks   Status New   Long Term Additional Goals   Additional Long Term Goals Yes   OT LONG TERM GOAL #6   Title Pt will be able to write simple sentence and name with 90% legibility.   Time 8   Period Weeks   Status New               Plan - 2015/02/21 1756    Clinical Impression Statement Pt is a 69 y.o. male s/p CVA 12/10/14.  Pt reports speech, R side hearing deficits, and decreased RUE functional use.  Pt presents with aphasia, R hemiparesis with ataxia, decr coordination, decr strength, decr sensation, visual deficit, cognitive deficits.   Pt would benefit from occupational therapy to improve dominant RUE functional use and  ADL/IADL performance for incr safety, independence, and quality of life.   Pt will benefit from skilled therapeutic intervention in order to improve on the following deficits (Retired) Decreased cognition;Decreased strength;Impaired vision/preception;Pain;Impaired UE functional use;Decreased knowledge of use of DME;Impaired sensation;Decreased safety awareness;Decreased coordination   Rehab Potential Good   Clinical Impairments Affecting Rehab Potential aphasia/cognition   OT Frequency 2x / week   OT Duration 8 weeks  +eval   OT Treatment/Interventions Self-care/ADL training;Electrical Stimulation;Moist Heat;Fluidtherapy;Contrast Bath;Passive range of motion;Visual/perceptual remediation/compensation;Cognitive remediation/compensation;Therapeutic activities;DME and/or AE instruction;Parrafin;Cryotherapy;Energy conservation;Therapeutic exercises;Splinting;Manual Therapy;Neuromuscular education;Ultrasound;Therapeutic exercise;Functional Mobility Training;Patient/family education   Plan initiate HEP for RUE coordination, strategies for ADLs   Consulted and Agree with Plan of Care Patient;Family member/caregiver   Family Member Consulted son          G-Codes - 21-Feb-2015 1809    Functional Assessment Tool Used R box and blocks:  28, R 9-hole peg test:  only able to put in 5 pegs within 2 min, writing name with 20% legibility, eating 20% with RUE   Functional Limitation Carrying, moving and handling objects   Carrying, Moving and Handling Objects Current Status (M5784) At least 80 percent but less than 100 percent impaired, limited or restricted   Carrying, Moving and Handling Objects Goal Status (O9629) At least 20 percent but less than 40 percent impaired, limited or restricted      Problem List  Patient Active Problem List   Diagnosis Date Noted  . 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 Type 2 diabetes mellitus with  peripheral neuropathy (HCC)   . Essential hypertension   . Alcohol withdrawal (HCC)   . Cerebral hemorrhage (HCC)   . Endotracheally intubated   . ICH (intracerebral hemorrhage) (HCC) 12/10/2014    [facility] 01/22/2015, 6:11 PM  [facility] [address] [place], [place], [address] Phone: [phone]   Fax:  [phone]  Name: Tarun Patchell MRN: [id number] Date of Birth: 1945/06/04  Willa Frater, OTR/L 01/22/2015 6:11 PM

## 2015-01-26 ENCOUNTER — Ambulatory Visit: Payer: Medicare HMO

## 2015-01-26 DIAGNOSIS — IMO0002 Reserved for concepts with insufficient information to code with codable children: Secondary | ICD-10-CM

## 2015-01-26 DIAGNOSIS — I69351 Hemiplegia and hemiparesis following cerebral infarction affecting right dominant side: Secondary | ICD-10-CM | POA: Diagnosis not present

## 2015-01-26 NOTE — Therapy (Signed)
[facility] [address] [place], [place], [address] Phone: [phone]   Fax:  [phone]  Speech Language Pathology Evaluation  Patient Details  Name: Richard [name] MRN: [id number] Date of Birth: 1945-07-15 Referring Provider: Claudette LawsKirsteins, Andrew, MD  Encounter Date: 01/26/2015      End of Session - 01/26/15 1218    Visit Number 1   Number of Visits 17   Date for SLP Re-Evaluation 03/27/15   SLP Start Time 1109   SLP Stop Time  1151   SLP Time Calculation (min) 42 min   Activity Tolerance Patient tolerated treatment well      Past Medical History  Diagnosis Date  . Diabetes mellitus without complication (HCC)   . Stroke [facility](HCC)     No past surgical history on file.  There were no vitals filed for this visit.  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            [facility] Evaluation [facility] - 01/26/15 1118    SLP Visit Information   SLP Received On 01/26/15   Referring Provider Claudette LawsKirsteins, Andrew, MD   Onset Date 12-10-14   Medical Diagnosis CVA   Pain Assessment   Currently in Pain? Yes   Pain Score 2    Pain Location Hand   Pain Orientation Right   Pain Frequency Intermittent   Pain Relieving Factors nothing   Effect of Pain on Daily Activities can't use rt hand like he used to   Prior Functional Status   Cognitive/Linguistic Baseline Within functional limits   Type of Home House    Lives With Spouse;Family   Vocation Retired   International aid/development workerAuditory Comprehension   Overall Auditory Comprehension Comments According to OT, may need to assess auditory comprehension further. No overt s/s difficulty with auditory comprehension in eval tasks today.   Expression   Primary Mode of Expression Verbal   Verbal Expression   Overall Verbal Expression Impaired   Initiation --   Level of  Generative/Spontaneous Verbalization Sentence;Conversation   Repetition Impaired   Effective Techniques --  reduced rate of speech   Other Verbal Expression Comments Pt with abandoned utterances in simple-mod complex conversation, hinders overall message conveyance. Phonemic paraphasias and hesitancies seen in simple to mod complex speech, less frequent errors in simpler linguistic contexts. Pt appears aware of most errors. In reading, pt with semantic and phonemic paraphasias with additions (e.g., "Prisma" for "prism", "rainbows" for "raindrops", "light source" for "light", ). During reading task, pt stated, I know what I'm supposed to read but it gets jumbled."   Written Expression   Dominant Hand Right   Written Expression Exceptions to [facility]   Overall Writen Expression Pt had difficulty writing city name, and words (2-3 syllable) to dictation   Oral Motor/Sensory Function   Labial ROM Within Functional Limits   Labial Symmetry Within Functional Limits   Lingual ROM Reduced right   Lingual Symmetry Within Functional Limits   Lingual Strength Reduced Right  slight   Motor Speech   Overall Motor Speech Impaired   Articulation Impaired   Level of Impairment Conversation   Intelligibility Intelligible   Conversation 75-100% accurate   Assessment   Type of Aphasia Broca's (expressive)  SLP Education - Feb 07, 2015 1218    Education provided Yes   Education Details therapy course - frequency/duration   Person(s) Educated Patient   Methods Explanation   Comprehension Verbalized understanding          SLP Short Term Goals - 2015-02-07 1220    SLP SHORT TERM GOAL #1   Title pt will describe pictures in detail with 80% success using compensations   Time 4   Period Weeks   Status New   SLP SHORT TERM GOAL #2   Title pt will write short lists, and other functional word level tasks, with extra time and rare min A   Time 4   Period Weeks   Status  New   SLP SHORT TERM GOAL #3   Title pt will tell SLP three compensations for expressive language    Time 4   Period Weeks   Status New          SLP Long Term Goals - 02-07-15 1222    SLP LONG TERM GOAL #1   Title pt will functionally participate in 10 minutes mod copmlex conversation using compensations   Time 8   Period Weeks   Status New   SLP LONG TERM GOAL #2   Title pt will engage in 8 minutes mod complex-complex conversation with rare min A   Time 8   Period Weeks   Status New   SLP LONG TERM GOAL #3   Title pt will write short notes with rare min A   Time 8   Period Weeks   Status New          Plan - 02/07/15 1219    Clinical Impression Statement Pt presents with mod expressive aphasia (verbal and written) 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 Pt will benefit from skilled ST to maximize expressive language skills in order to incr QOL.   Speech Therapy Frequency 2x / week   Duration --  8 weeks   Treatment/Interventions SLP instruction and feedback;Compensatory strategies;Patient/family education;Functional tasks;Cueing hierarchy;Language facilitation   Potential to Achieve Goals Good          G-Codes - 02/07/15 1230    Functional Assessment Tool Used noms-approx 45% impaired   Functional Limitations Spoken language expressive   Spoken Language Expression Current Status [phone]) At least 40 percent but less than 60 percent impaired, limited or restricted   Spoken Language Expression Goal Status (J1914) At least 1 percent but less than 20 percent impaired, limited or restricted      Problem List Patient Active Problem List   Diagnosis Date Noted  . 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 Type 2 diabetes mellitus with peripheral neuropathy (HCC)   . Essential hypertension   . Alcohol withdrawal (HCC)   . Cerebral hemorrhage (HCC)   .  Endotracheally intubated   . ICH (intracerebral hemorrhage) (HCC) 12/10/2014    [facility] , MS, CCC-SLP  02/07/15, 12:32 PM  [facility] [address] [place], [place], [address] Phone: [phone]   Fax:  [phone]  Name: Richard [name] MRN: [id number] Date of Birth: May 02, 1945

## 2015-01-27 ENCOUNTER — Ambulatory Visit: Payer: Medicare HMO | Attending: Physical Medicine & Rehabilitation | Admitting: Occupational Therapy

## 2015-01-27 DIAGNOSIS — R278 Other lack of coordination: Secondary | ICD-10-CM

## 2015-01-27 DIAGNOSIS — I69351 Hemiplegia and hemiparesis following cerebral infarction affecting right dominant side: Secondary | ICD-10-CM | POA: Diagnosis not present

## 2015-01-27 DIAGNOSIS — I69320 Aphasia following cerebral infarction: Secondary | ICD-10-CM | POA: Diagnosis present

## 2015-01-27 DIAGNOSIS — R279 Unspecified lack of coordination: Secondary | ICD-10-CM | POA: Insufficient documentation

## 2015-01-27 DIAGNOSIS — R270 Ataxia, unspecified: Secondary | ICD-10-CM | POA: Insufficient documentation

## 2015-01-27 NOTE — Patient Instructions (Addendum)
  Coordination Activities  Perform the following activities for 20 minutes 1-2 times per day with right hand(s).   Rotate ball in fingertips (clockwise and counter-clockwise).  Toss ball between hands.  Toss ball in air and catch with the same hand.  Flip cards 1 at a time as fast as you can (deck of cards on table).  Place deck of cards on table.  Then extend fingers to push cards off top of deck.  Deal cards with your thumb (Hold deck in hand and push card off top with thumb).  Pick up coins, buttons, marbles, dried beans/pasta of different sizes and place in container.  Pick up coins and place in container or coin bank.  Practice writing   Screw together nuts and bolts, then unfasten.

## 2015-01-27 NOTE — Therapy (Signed)
[facility] [address] [place], [place], [address] Phone: [phone]   Fax:  [phone]  Occupational Therapy Treatment  Patient Details  Name: Richard [name] MRN: [id number] Date of Birth: [birth year] Referring Provider: Dr. Claudette [name]  Encounter Date: 01/27/2015      OT End of Session - 01/27/15 1027    Visit Number 2   Number of Visits 17   Date for OT Re-Evaluation 03/23/15   Authorization Type Aet[employer], no visit limit, no auth, ?G-code   Authorization - Visit Number 2   Authorization - Number of Visits 10   OT Start Time 1025   OT Stop Time 1103   OT Time Calculation (min) 38 min   Activity Tolerance Patient tolerated treatment well   Behavior During Therapy --  easily frustrated      Past Medical History  Diagnosis Date  . Diabetes mellitus without complication (HCC)   . Stroke [facility])     No past surgical history on file.  There were no vitals filed for this visit.  Visit Diagnosis:  Hemiplegia and hemiparesis following cerebral infarction affecting right dominant side (HCC)  Decreased coordination      Subjective Assessment - 01/27/15 1025    Subjective  "It's getting better"  (hand pain).  Pt reports eating more with RUE at home   Currently in Pain? Yes   Pain Score 2    Pain Location Hand   Pain Orientation Right   Pain Descriptors / Indicators Pins and needles;Tingling   Pain Frequency Intermittent   Aggravating Factors  unable to tell   Pain Relieving Factors nothing                      OT Treatments/Exercises (OP) - 01/27/15 0001    ADLs   Eating Issued 3 red foam grips for eating utensils and toothbrush for incr ease and control.  Practiced simulated eating to scoop dried beans with spoon with red foam grip.  Pt reports incr ease using foam grip.   Writing Practiced writing name and address with approx 95% legibilty with foam grip.  Issued foam grip for home due to  incr ease.                OT Education - 01/27/15 1036    Education provided Yes   Education Details coordination HEP   Person(s) Educated Patient   Methods Explanation;Handout;Verbal cues;Demonstration   Comprehension Verbalized understanding;Returned demonstration          OT Short Term Goals - 01/22/15 1802    OT SHORT TERM GOAL #1   Title Pt will be independent with initial HEP.--check STGs 02/21/16   Time 4   Period Weeks   Status New   OT SHORT TERM GOAL #2   Title Pt will demo improved RUE coordination as shown by improving score on box and blocks test by at least 8.   Baseline R-28 blocks, L-41 blocks   Time 4   Period Weeks   Status New   OT SHORT TERM GOAL #3   Title Pt will demo improved R grip strength by at least 8lbs to assist in lifting/gripping for ADLs.   Baseline R-56lbs, L-77lbs   Time 4   Period Weeks   Status New   OT SHORT TERM GOAL #4   Title Pt will eat with RUE at least 50% of the time.   Baseline approx 20%   Time 4   Period  Weeks   Status New   OT SHORT TERM GOAL #5   Title Pt will write name with at least 75% legibility.   Baseline 20%   Time 4   Period Weeks   Status New           OT Long Term Goals - 01/22/15 1805    OT LONG TERM GOAL #1   Title Pt will be independent with updated HEP.--check LTGs 03/23/15   Time 8   Period Weeks   Status New   OT LONG TERM GOAL #2   Title Pt will improve RUE coordination for ADLs as shown by completing 9-hole peg test in 90sec or less.   Baseline only placed 5 pegs in within 2 min   Time 8   Period Weeks   Status New   OT LONG TERM GOAL #3   Title Pt will perform simple cooking/cleaning task mod I.   Baseline not performing   Time 8   Period Weeks   Status New   OT LONG TERM GOAL #4   Title Pt will use dominant RUE for brushing teeth/eating at least 90% of the time.   Time 8   Period Weeks   Status New   OT LONG TERM GOAL #5   Title Pt will be able to complete clothing  fasteners and cut food using AE prn.   Time 8   Period Weeks   Status New   Long Term Additional Goals   Additional Long Term Goals Yes   OT LONG TERM GOAL #6   Title Pt will be able to write simple sentence and name with 90% legibility.   Time 8   Period Weeks   Status New               Plan - 01/27/15 1220    Clinical Impression Statement Pt is progressing towards goals and demo improved writing this week and reports using RUE more to eat.   Plan functional reaching, coordination   Consulted and Agree with Plan of Care Patient        Problem List Patient Active Problem List   Diagnosis Date Noted  . 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 Type 2 diabetes mellitus with peripheral neuropathy (HCC)   . Essential hypertension   . Alcohol withdrawal (HCC)   . Cerebral hemorrhage (HCC)   . Endotracheally intubated   . ICH (intracerebral hemorrhage) (HCC) 12/10/2014    [facility],[name] 01/27/2015, 12:47 PM   [facility] [address] [place], [place], [address] Phone: [phone]   Fax:  [phone]  Name: Richard [name] MRN: [id number] Date of Birth: 12-Dec-1945  [name], OTR/L 01/27/2015 12:47 PM

## 2015-01-29 ENCOUNTER — Ambulatory Visit: Payer: Medicare HMO | Admitting: Speech Pathology

## 2015-01-29 ENCOUNTER — Ambulatory Visit: Payer: Medicare HMO | Admitting: Occupational Therapy

## 2015-01-29 DIAGNOSIS — R279 Unspecified lack of coordination: Secondary | ICD-10-CM

## 2015-01-29 DIAGNOSIS — R278 Other lack of coordination: Secondary | ICD-10-CM

## 2015-01-29 DIAGNOSIS — I69351 Hemiplegia and hemiparesis following cerebral infarction affecting right dominant side: Secondary | ICD-10-CM | POA: Diagnosis not present

## 2015-01-29 DIAGNOSIS — IMO0002 Reserved for concepts with insufficient information to code with codable children: Secondary | ICD-10-CM

## 2015-01-29 NOTE — Therapy (Signed)
[facility] [address] [place], [place], [address] Phone: [phone]   Fax:  [phone]  Occupational Therapy Treatment  Patient Details  Name: Richard [name] MRN: [id number] Date of Birth: Apr 07, 1945 Referring Provider: Dr. Claudette Laws  Encounter Date: 01/29/2015      OT End of Session - 01/29/15 1024    Visit Number 3   Number of Visits 17   Date for OT Re-Evaluation 03/23/15   Authorization Type [employer] Medicare, no visit limit, no auth, G-code   Authorization - Visit Number 3   Authorization - Number of Visits 10   OT Start Time 1020   OT Stop Time 1100   OT Time Calculation (min) 40 min   Activity Tolerance Patient tolerated treatment well   Behavior During Therapy [facility] for tasks assessed/performed  easily frustrated      Past Medical History  Diagnosis Date  . Diabetes mellitus without complication (HCC)   . Stroke [facility])     No past surgical history on file.  There were no vitals filed for this visit.  Visit Diagnosis:  Hemiplegia and hemiparesis following cerebral infarction affecting right dominant side (HCC)  Decreased coordination      Subjective Assessment - 01/29/15 1021    Subjective  Pt reports that he is eating approx 75% with R hand, foam helped a little   Patient Stated Goals improve RUE functional use   Pain Score 1    Pain Location Hand   Pain Orientation Right   Pain Descriptors / Indicators Pins and needles;Tingling;Aching   Pain Frequency Intermittent   Aggravating Factors  use   Pain Relieving Factors nothing                      OT Treatments/Exercises (OP) - 01/29/15 0001    Fine Motor Coordination   Fine Motor Coordination --   Small Pegboard Placing medium pegs in pegboard to copy design with mod difficulty (max difficulty with small pegs) with in-hand manipulation/coordination.  Copied with good accuracy (simple design)   Functional Reaching  Activities   High Level low>high reaching to place clothespins with 1-8lb resistance on vertical pole for incr coordination.  Pt with mod difficulty with in hand manipulation and needed multiple rest breaks due to pain.                OT Education - 01/29/15 1036    Education Details CVA risk factors, Recommended pt discuss nerve pain with Dr. Wynn Banker as it interfers with R hand functional use and incr with use   Person(s) Educated Patient   Methods Explanation;Handout   Comprehension Verbalized understanding          OT Short Term Goals - 01/22/15 1802    OT SHORT TERM GOAL #1   Title Pt will be independent with initial HEP.--check STGs 02/21/16   Time 4   Period Weeks   Status New   OT SHORT TERM GOAL #2   Title Pt will demo improved RUE coordination as shown by improving score on box and blocks test by at least 8.   Baseline R-28 blocks, L-41 blocks   Time 4   Period Weeks   Status New   OT SHORT TERM GOAL #3   Title Pt will demo improved R grip strength by at least 8lbs to assist in lifting/gripping for ADLs.   Baseline R-56lbs, L-77lbs   Time 4   Period Weeks   Status New  OT SHORT TERM GOAL #4   Title Pt will eat with RUE at least 50% of the time.   Baseline approx 20%   Time 4   Period Weeks   Status New   OT SHORT TERM GOAL #5   Title Pt will write name with at least 75% legibility.   Baseline 20%   Time 4   Period Weeks   Status New           OT Long Term Goals - 01/22/15 1805    OT LONG TERM GOAL #1   Title Pt will be independent with updated HEP.--check LTGs 03/23/15   Time 8   Period Weeks   Status New   OT LONG TERM GOAL #2   Title Pt will improve RUE coordination for ADLs as shown by completing 9-hole peg test in 90sec or less.   Baseline only placed 5 pegs in within 2 min   Time 8   Period Weeks   Status New   OT LONG TERM GOAL #3   Title Pt will perform simple cooking/cleaning task mod I.   Baseline not performing   Time 8    Period Weeks   Status New   OT LONG TERM GOAL #4   Title Pt will use dominant RUE for brushing teeth/eating at least 90% of the time.   Time 8   Period Weeks   Status New   OT LONG TERM GOAL #5   Title Pt will be able to complete clothing fasteners and cut food using AE prn.   Time 8   Period Weeks   Status New   Long Term Additional Goals   Additional Long Term Goals Yes   OT LONG TERM GOAL #6   Title Pt will be able to write simple sentence and name with 90% legibility.   Time 8   Period Weeks   Status New               Plan - 01/29/15 1206    Clinical Impression Statement Pt continues to progress with RUE functional use, but pain and sensory deficits are barriers.   Plan coordination   Consulted and Agree with Plan of Care Patient        Problem List Patient Active Problem List   Diagnosis Date Noted  . 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 Type 2 diabetes mellitus with peripheral neuropathy (HCC)   . Essential hypertension   . Alcohol withdrawal (HCC)   . Cerebral hemorrhage (HCC)   . Endotracheally intubated   . ICH (intracerebral hemorrhage) (HCC) 12/10/2014    [facility] PaFREEMAN,ANGELA 01/29/2015, 12:07 PM  [facility] [address] [place], [place], [address] Phone: [phone]   Fax:  [phone]  Name: Richard JakschLeon [name] MRN: [id number] Date of Birth: Jul 26, 1945  Willa Fraterngela Freeman, OTR/L 01/29/2015 12:07 PM

## 2015-01-29 NOTE — Patient Instructions (Signed)
Tips for Talking with People who have Aphasia  . Say one thing at a time . Don't  rush - slow down, be patient . Reduce background noise . Relax - be natural . Use pen and paper . Write down key words . Draw diagrams or pictures . Don't pretend you understand . Ask what helps . Recap - check you both understand   Describing words  What group does it belong to?  What do I use it for?  Where can I find it?  What does it LOOK like?  What other words go with it?  What is the 1st sound of the word?  Many Ways to Communicate  Describe it Write it Draw it Gesture it Use related words  There's an App for that: Family Feud, Heads up, Stop-fun categories, What if, Conversation TherAPPy  Provided by: Laura L. ST, [phone]   

## 2015-01-29 NOTE — Therapy (Signed)
[facility] [address] [place], [place], [address] Phone: [phone]   Fax:  [phone]  Speech Language Pathology Treatment  Patient Details  Name: Richard [name] MRN: [id number] Date of Birth: [birth year] Referring Provider: Claudette Laws, MD  Encounter Date: 01/29/2015      End of Session - 01/29/15 0942    Visit Number 2   Number of Visits 17   Date for SLP Re-Evaluation 03/27/15      Past Medical History  Diagnosis Date  . Diabetes mellitus without complication (HCC)   . Stroke [facility])     No past surgical history on file.  There were no vitals filed for this visit.  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      Subjective Assessment - 01/29/15 0856    Subjective "I did not have homework"               ADULT SLP TREATMENT - 01/29/15 0852    General Information   Behavior/Cognition Alert;Cooperative;Pleasant mood   Treatment Provided   Treatment provided Cognitive-Linquistic   Pain Assessment   Pain Assessment 0-10   Pain Score 2    Pain Location right arm and foot   Pain Descriptors / Indicators Aching   Pain Intervention(s) Monitored during session   Cognitive-Linquistic Treatment   Treatment focused on Aphasia   Skilled Treatment Facilitated verbal descriptive with similarities and differences with  usual min A for more word finding episodes 75% accuracy. Trained pt iin use of descriptives as compensation for aphasia, facilitated this with describing animals/foods for ST to guess with extended time, usual min questioning cues and rare min phonemic cues.   Assessment / Recommendations / Plan   Plan Continue with current plan of care   Progression Toward Goals   Progression toward goals Progressing toward goals          SLP Education - 01/29/15 0933    Education provided Yes   Education Details compensations for aphasia   Person(s) Educated Patient   Methods  Explanation;Handout;Verbal cues;Demonstration   Comprehension Verbalized understanding;Verbal cues required;Need further instruction;Returned demonstration          SLP Short Term Goals - 01/29/15 0933    SLP SHORT TERM GOAL #1   Title pt will describe pictures in detail with 80% success using compensations   Time 4   Period Weeks   Status On-going   SLP SHORT TERM GOAL #2   Title pt will write short lists, and other functional word level tasks, with extra time and rare min A   Time 4   Period Weeks   Status On-going   SLP SHORT TERM GOAL #3   Title pt will tell SLP three compensations for expressive language    Time 4   Period Weeks   Status On-going          SLP Long Term Goals - 01/29/15 0934    SLP LONG TERM GOAL #1   Title pt will functionally participate in 10 minutes mod copmlex conversation using compensations   Time 8   Period Weeks   Status On-going   SLP LONG TERM GOAL #2   Title pt will engage in 8 minutes mod complex-complex conversation with rare min A   Time 8   Period Weeks   Status On-going   SLP LONG TERM GOAL #3   Title pt will write short notes with rare min A   Time 8   Period Weeks  Status On-going          Plan - 01/29/15 0939    Clinical Impression Statement Moderate expressive aphasia persists at simple conversation level- compensations for aphasia during structured tasks with min to mod A. Continue skilled ST t maximize verbal and written expression.   Speech Therapy Frequency 2x / week   Treatment/Interventions SLP instruction and feedback;Compensatory strategies;Patient/family education;Functional tasks;Cueing hierarchy;Language facilitation   Potential to Achieve Goals Good        Problem List Patient Active Problem List   Diagnosis Date Noted  . 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 Type 2 diabetes mellitus with peripheral neuropathy (HCC)   .  Essential hypertension   . Alcohol withdrawal (HCC)   . Cerebral hemorrhage (HCC)   . Endotracheally intubated   . ICH (intracerebral hemorrhage) (HCC) 12/10/2014    Randel Hargens, Radene JourneyLaura Ann  MS, CCC-SLP  01/29/2015, 9:42 AM  [facility] [address] [place], [place], [address] Phone: [phone]   Fax:  [phone]   Name: Richard [name] MRN: [id number] Date of Birth: March 04, 1946

## 2015-02-03 ENCOUNTER — Ambulatory Visit: Payer: Medicare HMO | Admitting: Occupational Therapy

## 2015-02-03 DIAGNOSIS — R279 Unspecified lack of coordination: Secondary | ICD-10-CM

## 2015-02-03 DIAGNOSIS — I69351 Hemiplegia and hemiparesis following cerebral infarction affecting right dominant side: Secondary | ICD-10-CM | POA: Diagnosis not present

## 2015-02-03 DIAGNOSIS — R270 Ataxia, unspecified: Secondary | ICD-10-CM

## 2015-02-03 DIAGNOSIS — R278 Other lack of coordination: Secondary | ICD-10-CM

## 2015-02-03 NOTE — Therapy (Signed)
[facility] [address] [place], [place], [address] Phone: [phone]   Fax:  [phone]  Occupational Therapy Treatment  Patient Details  Name: Richard [name] MRN: [id number] Date of Birth: 1945-04-16 Referring Provider: Dr. Alysia Penna  Encounter Date: 02/03/2015      OT End of Session - 02/03/15 0852    Visit Number 4   Number of Visits 17   Date for OT Re-Evaluation 03/23/15   Authorization Type A[employer] Medicare, no visit limit, no auth, G-code   Authorization - Visit Number 4   Authorization - Number of Visits 10   OT Start Time [phone]   OT Stop Time 0932   OT Time Calculation (min) 38 min   Activity Tolerance Patient tolerated treatment well   Behavior During Therapy [facility] for tasks assessed/performed  easily frustrated      Past Medical History  Diagnosis Date  . Diabetes mellitus without complication ([place])   . Stroke [facility])     No past surgical history on file.  There were no vitals filed for this visit.  Visit Diagnosis:  Ataxia  Hemiplegia and hemiparesis following cerebral infarction affecting right dominant side ([place])  Decreased coordination      Subjective Assessment - 02/03/15 0857    Subjective  Pt reports that PCP changed DM medication to [facility] and will change BP meds to lisonipril when current perscription ends   Patient Stated Goals improve RUE functional use   Currently in Pain? Yes   Pain Score 1    Pain Location Hand   Pain Orientation Right   Pain Descriptors / Indicators Tingling;Aching;Pins and needles   Pain Type Chronic pain   Aggravating Factors  use    Pain Relieving Factors nothing                      OT Treatments/Exercises (OP) - 02/03/15 0001    ADLs   Writing Practiced writing with foam grip.  Wrote name, address, and 3 sentences with good legibility and incr time (printing)   Fine Motor Coordination   Fine Motor Coordination Picking up coins    Picking up coins to place in coin bank with min-mod difficulty   Sensation Exercises   Stereognosis Pulling items from rice bath with max difficulty.  Only able to feel bigger (1-inch items), unable to determine what objects are with vision occluded.  Pt instructed on how to do this at home (and verbalized understanding.)   Functional Reaching Activities   Mid Level to place checkers in connect 4 slots for incr coordination with min difficulty                  OT Short Term Goals - 02/03/15 0923    OT SHORT TERM GOAL #1   Title Pt will be independent with initial HEP.--check STGs 02/21/16   Time 4   Period Weeks   Status New   OT SHORT TERM GOAL #2   Title Pt will demo improved RUE coordination as shown by improving score on box and blocks test by at least 8.   Baseline R-28 blocks, L-41 blocks   Time 4   Period Weeks   Status New   OT SHORT TERM GOAL #3   Title Pt will demo improved R grip strength by at least 8lbs to assist in lifting/gripping for ADLs.   Baseline R-56lbs, L-77lbs   Time 4   Period Weeks   Status New   OT  SHORT TERM GOAL #4   Title Pt will eat with RUE at least 50% of the time.   Baseline approx 20%   Time 4   Period Weeks   Status New   OT SHORT TERM GOAL #5   Title Pt will write name with at least 75% legibility.   Baseline 20%   Time 4   Period Weeks   Status Achieved  02/03/15  100% printing           OT Long Term Goals - 02/03/15 0924    OT LONG TERM GOAL #1   Title Pt will be independent with updated HEP.--check LTGs 03/23/15   Time 8   Period Weeks   Status New   OT LONG TERM GOAL #2   Title Pt will improve RUE coordination for ADLs as shown by completing 9-hole peg test in 90sec or less.   Baseline only placed 5 pegs in within 2 min   Time 8   Period Weeks   Status New   OT LONG TERM GOAL #3   Title Pt will perform simple cooking/cleaning task mod I.   Baseline not performing   Time 8   Period Weeks   Status New    OT LONG TERM GOAL #4   Title Pt will use dominant RUE for brushing teeth/eating at least 90% of the time.   Time 8   Period Weeks   Status New   OT LONG TERM GOAL #5   Title Pt will be able to complete clothing fasteners and cut food using AE prn.   Time 8   Period Weeks   Status New   OT LONG TERM GOAL #6   Title Pt will be able to write simple sentence and name with 90% legibility.   Time 8   Period Weeks   Status Achieved  02/03/15 (printing)               Plan - 02/03/15 0925    Clinical Impression Statement Pt demo significant improvement with writing legibility this week (writing goals met).  Sensory deficits continue to affect coordination tasks.   Plan coordination, sensory re-training, RUE functional use   Consulted and Agree with Plan of Care Patient        Problem List Patient Active Problem List   Diagnosis Date Noted  . 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 Type 2 diabetes mellitus with peripheral neuropathy (HCC)   . Essential hypertension   . Alcohol withdrawal ([place])   . Cerebral hemorrhage ([place])   . Endotracheally intubated   . ICH (intracerebral hemorrhage) ([place]) 12/10/2014    [facility] 02/03/2015, 9:30 AM  [facility] [address] [place], [place], [address] Phone: [phone]   Fax:  [phone]  Name: Jabree Rebert MRN: [id number] Date of Birth: 1945-06-06  Vianne Bulls, OTR/L 02/03/2015 9:30 AM

## 2015-02-09 ENCOUNTER — Encounter: Payer: Medicare HMO | Attending: Physical Medicine & Rehabilitation

## 2015-02-09 ENCOUNTER — Ambulatory Visit (HOSPITAL_BASED_OUTPATIENT_CLINIC_OR_DEPARTMENT_OTHER): Payer: Medicare HMO | Admitting: Physical Medicine & Rehabilitation

## 2015-02-09 ENCOUNTER — Encounter: Payer: Self-pay | Admitting: Physical Medicine & Rehabilitation

## 2015-02-09 VITALS — BP 142/75 | HR 92 | Resp 14

## 2015-02-09 DIAGNOSIS — I610 Nontraumatic intracerebral hemorrhage in hemisphere, subcortical: Secondary | ICD-10-CM | POA: Insufficient documentation

## 2015-02-09 DIAGNOSIS — I69320 Aphasia following cerebral infarction: Secondary | ICD-10-CM | POA: Insufficient documentation

## 2015-02-09 DIAGNOSIS — E119 Type 2 diabetes mellitus without complications: Secondary | ICD-10-CM | POA: Insufficient documentation

## 2015-02-09 DIAGNOSIS — I69898 Other sequelae of other cerebrovascular disease: Secondary | ICD-10-CM

## 2015-02-09 DIAGNOSIS — I69393 Ataxia following cerebral infarction: Secondary | ICD-10-CM | POA: Insufficient documentation

## 2015-02-09 DIAGNOSIS — R208 Other disturbances of skin sensation: Secondary | ICD-10-CM | POA: Diagnosis not present

## 2015-02-09 DIAGNOSIS — R209 Unspecified disturbances of skin sensation: Secondary | ICD-10-CM

## 2015-02-09 DIAGNOSIS — IMO0002 Reserved for concepts with insufficient information to code with codable children: Secondary | ICD-10-CM | POA: Insufficient documentation

## 2015-02-09 DIAGNOSIS — I69398 Other sequelae of cerebral infarction: Secondary | ICD-10-CM

## 2015-02-09 NOTE — Patient Instructions (Addendum)
Graduated return to driving instructions were provided. It is recommended that the patient first drives with another licensed driver in an empty parking lot. If the patient does well with this, and they can drive on a quiet street with the licensed driver. If the patient does well with this they can drive on a busy street with a licensed driver. If the patient does well with this, the next time out they can go by himself. For the first month after resuming driving, I recommend no nighttime or Interstate driving.   Dr Ursula BeathSethi [facility]

## 2015-02-09 NOTE — Progress Notes (Signed)
Subjective:    Patient ID: Richard [name], male    DOB: 1945/04/17, 69 y.o.   MRN: [id number] 69 year old right-handed male with history of alcohol abuse, diabetes mellitus, artificial left eye, CVA with little residual.  By report, he lives with his wife, independent prior to admission.  Presented on December 10, 2014, with aphasia and right-sided weakness.  No recent head trauma.  No use of anticoagulation or antiplatelets.  Systolic blood pressure greater than 200.  CT of the brain showed evidence of lobar left frontal ICH, measuring 3.5 x 2.5 cm with mild surrounding vasogenic edema.  No hydrocephalus.  MRI of the brain showed acute left posterior frontal intracerebral hemorrhage, most likely hypertensive bleed  HPI Home at supervision level about 1 mo ago.  Still with wordfinding deficits Seen by PCP for HTN and DM Receiving outpt speech and OT  Pain Inventory Average Pain 2 Pain Right Now 2 My pain is intermittent, dull, stabbing, tingling and aching  In the last 24 hours, has pain interfered with the following? General activity na Relation with others na Enjoyment of life na What TIME of day is your pain at its worst? na Sleep (in general) Good  Pain is worse with: na Pain improves with: na Relief from Meds: na  Mobility walk without assistance how many minutes can you walk? ? ability to climb steps?  yes do you drive?  no Do you have any goals in this area?  yes  Function not employed: date last employed . I need assistance with the following:  meal prep, household duties and shopping  Neuro/Psych numbness tingling  Prior Studies hospital f/u  Physicians involved in your care hospital f/u   History reviewed. No pertinent family history. Social History   Social History  . Marital Status: Married    Spouse Name: N/A  . Number of Children: N/A  . Years of Education: N/A   Social History Main Topics  . Smoking status: Never Smoker   . Smokeless  tobacco: None  . Alcohol Use: Yes     Comment: occasional   . Drug Use: None  . Sexual Activity: Not Asked   Other Topics Concern  . None   Social History Narrative   History reviewed. No pertinent past surgical history. Past Medical History  Diagnosis Date  . Diabetes mellitus without complication (HCC)   . Stroke (HCC)    BP 142/75 mmHg  Pulse 92  Resp 14  SpO2 95%  Opioid Risk Score:   Fall Risk Score:  `1  Depression screen PHQ 2/9  Depression screen PHQ 2/9 02/09/2015  Decreased Interest 2  Down, Depressed, Hopeless 0  PHQ - 2 Score 2  Altered sleeping 0  Tired, decreased energy 3  Change in appetite 0  Feeling bad or failure about yourself  0  Trouble concentrating 3  Moving slowly or fidgety/restless 3  Suicidal thoughts 0  PHQ-9 Score 11  Difficult doing work/chores Very difficult     Review of Systems  Constitutional: Positive for unexpected weight change.  Genitourinary:       Retention  Neurological: Positive for numbness.       Tingling        Objective:   Physical Exam  Constitutional: He is oriented to person, place, and time. He appears well-developed and well-nourished.  HENT:  Head: Normocephalic and atraumatic.  Cardiovascular: Normal rate and regular rhythm.   Neurological: He is alert and oriented to person, place, and time.  Psychiatric: He  has a normal mood and affect.  Nursing note and vitals reviewed.  Patient has 5/5 strength bilateral deltoid, biceps, triceps, grip, hip flexors, knee extensors, ankle dorsi flexor plantar flexor Evidence word finding difficulties. Is able to repeat a phrase Able to name simple objects       Assessment & Plan:  Left MCA distribution Intracranial hemorrhage with residual mild coordination deficits in the right hand as well as aphasia. Continue outpatient OT for fine motor deficits Continue outpatient speech therapy for aphasia May return to driving Graduated return to driving  instructions were provided. It is recommended that the patient first drives with another licensed driver in an empty parking lot. If the patient does well with this, and they can drive on a quiet street with the licensed driver. If the patient does well with this they can drive on a busy street with a licensed driver. If the patient does well with this, the next time out they can go by himself. For the first month after resuming driving, I recommend no nighttime or Interstate driving.

## 2015-02-10 ENCOUNTER — Inpatient Hospital Stay: Payer: Medicare HMO | Admitting: Physical Medicine & Rehabilitation

## 2015-02-11 ENCOUNTER — Ambulatory Visit: Payer: Medicare HMO

## 2015-02-11 ENCOUNTER — Ambulatory Visit: Payer: Medicare HMO | Admitting: Occupational Therapy

## 2015-02-11 DIAGNOSIS — I69351 Hemiplegia and hemiparesis following cerebral infarction affecting right dominant side: Secondary | ICD-10-CM

## 2015-02-11 DIAGNOSIS — R279 Unspecified lack of coordination: Principal | ICD-10-CM

## 2015-02-11 DIAGNOSIS — R278 Other lack of coordination: Secondary | ICD-10-CM

## 2015-02-11 DIAGNOSIS — IMO0002 Reserved for concepts with insufficient information to code with codable children: Secondary | ICD-10-CM

## 2015-02-11 NOTE — Therapy (Signed)
[facility] [address] [place], [place], [address] Phone: [phone]   Fax:  [phone]  Occupational Therapy Treatment  Patient Details  Name: Richard [name] MRN: [id number] Date of Birth: 05-04-1945 Referring Provider: Dr. Claudette Laws  Encounter Date: 02/11/2015      OT End of Session - 02/11/15 1159    Visit Number 5   Number of Visits 17   Date for OT Re-Evaluation 03/23/15   Authorization Type [employer] Medicare, no visit limit, no auth, G-code   Authorization - Visit Number 5   Authorization - Number of Visits 10   OT Start Time 1152   OT Stop Time 1230   OT Time Calculation (min) 38 min   Activity Tolerance Patient tolerated treatment well   Behavior During Therapy [facility] for tasks assessed/performed  easily frustrated      Past Medical History  Diagnosis Date  . Diabetes mellitus without complication (HCC)   . Stroke [facility])     No past surgical history on file.  There were no vitals filed for this visit.  Visit Diagnosis:  Decreased coordination  Hemiplegia and hemiparesis following cerebral infarction affecting right dominant side [facility])      Subjective Assessment - 02/11/15 1152    Subjective  Pt reports that R hand pain is improving.     Patient Stated Goals improve RUE functional use   Currently in Pain? Yes   Pain Score 2    Pain Location Hand   Pain Orientation Right   Pain Descriptors / Indicators Tingling;Pins and needles   Pain Frequency Intermittent   Aggravating Factors  ?use   Pain Relieving Factors nothing                      OT Treatments/Exercises (OP) - 02/11/15 0001    Fine Motor Coordination   Fine Motor Coordination Small Pegboard;In hand manipuation training   In Hand Manipulation Training Manipulating cylinder pegs in hand to flip with mod difficulty, min cues.   Small Pegboard Placing small pegs in pegboard to copy design with mod difficulty with  in-hand manipulation/coordination.     Sensation Exercises   Sensory Retraining Pt instructed pt in using different textures to rub hand for sensory retraining and precautions.  Pt verbalized understanding.  Pulling small pegs from green putty for incr strength and for sensory re-training.  Pt unable to distinguish pegs from putty by feel--Recommended pt try this at home with red (softer) putty for strength/sensory retraining.                OT Education - 02/11/15 1203    Education Details Sensory retraining with different textures   Person(s) Educated Patient   Methods Explanation   Comprehension Verbalized understanding          OT Short Term Goals - 02/11/15 1200    OT SHORT TERM GOAL #1   Title Pt will be independent with initial HEP.--check STGs 02/21/16   Time 4   Period Weeks   Status New   OT SHORT TERM GOAL #2   Title Pt will demo improved RUE coordination as shown by improving score on box and blocks test by at least 8.   Baseline R-28 blocks, L-41 blocks   Time 4   Period Weeks   Status New   OT SHORT TERM GOAL #3   Title Pt will demo improved R grip strength by at least 8lbs to assist in lifting/gripping for ADLs.  Baseline R-56lbs, L-77lbs   Time 4   Period Weeks   Status New   OT SHORT TERM GOAL #4   Title Pt will eat with RUE at least 50% of the time.   Baseline approx 20%   Time 4   Period Weeks   Status Achieved  02/11/15:  using RUE for all eating except cutting meat   OT SHORT TERM GOAL #5   Title Pt will write name with at least 75% legibility.   Baseline 20%   Time 4   Period Weeks   Status Achieved  02/03/15  100% printing           OT Long Term Goals - 02/11/15 1201    OT LONG TERM GOAL #1   Title Pt will be independent with updated HEP.--check LTGs 03/23/15   Time 8   Period Weeks   Status New   OT LONG TERM GOAL #2   Title Pt will improve RUE coordination for ADLs as shown by completing 9-hole peg test in 90sec or less.    Baseline only placed 5 pegs in within 2 min   Time 8   Period Weeks   Status New   OT LONG TERM GOAL #3   Title Pt will perform simple cooking/cleaning task mod I.   Baseline not performing   Time 8   Period Weeks   Status New   OT LONG TERM GOAL #4   Title Pt will use dominant RUE for brushing teeth/eating at least 90% of the time.   Time 8   Period Weeks   Status Achieved  02/11/15   OT LONG TERM GOAL #5   Title Pt will be able to complete clothing fasteners and cut food using AE prn.   Time 8   Period Weeks   Status New   OT LONG TERM GOAL #6   Title Pt will be able to write simple sentence and name with 90% legibility.   Time 8   Period Weeks   Status Achieved  02/03/15 (printing)               Plan - 02/11/15 1244    Clinical Impression Statement Pt progressing with coordination and is progressing toward goals.  Pt now reports eating with RUE except cuttin food.   Plan check STGs next week, coordination, sensory re-training, RUE functional use   Consulted and Agree with Plan of Care Patient        Problem List Patient Active Problem List   Diagnosis Date Noted  . Aphasia complicating stroke 02/09/2015  . 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 Type 2 diabetes mellitus with peripheral neuropathy (HCC)   . Essential hypertension   . Alcohol withdrawal (HCC)   . Cerebral hemorrhage (HCC)   . Endotracheally intubated   . ICH (intracerebral hemorrhage) (HCC) 12/10/2014    [facility]EM[facility],ANGELA 02/11/2015, 12:45 PM  [facility] [address] [place], [place], [address] Phone: [phone]   Fax:  [phone]  Name: Richard [name] MRN: [id number] Date of Birth: 06-Jul-1945  Willa Fraterngela Freeman, OTR/L 02/11/2015 12:46 PM

## 2015-02-11 NOTE — Therapy (Signed)
[facility] [address] [place], [place], [address] Phone: [phone]   Fax:  [phone]  Speech Language Pathology Treatment  Patient Details  Name: Richard [name] MRN: [id number] Date of Birth: 1946-03-27 Referring Provider: Claudette Laws, MD  Encounter Date: 02/11/2015      End of Session - 02/11/15 1126    Visit Number 3   Number of Visits 17   Date for SLP Re-Evaluation 03/27/15   SLP Start Time 1104   SLP Stop Time  1145   SLP Time Calculation (min) 41 min   Activity Tolerance Patient tolerated treatment well      Past Medical History  Diagnosis Date  . Diabetes mellitus without complication (HCC)   . Stroke [facility])     No past surgical history on file.  There were no vitals filed for this visit.  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      Subjective Assessment - 02/11/15 1107    Subjective "(My talking's) getting better."               ADULT SLP TREATMENT - 02/11/15 1109    General Information   Behavior/Cognition Alert;Cooperative;Pleasant mood   Treatment Provided   Treatment provided Cognitive-Linquistic   Cognitive-Linquistic Treatment   Treatment focused on Aphasia   Skilled Treatment Pt relates that automatic speech is generated easier than two weeks ago, as is more spontaneous speech. SLP targeted sentence responses this session to improve pt's verbal expression skills. Pt described expressions /sayings with usual min to mod A from SLP - semantic cues proved most successful. In simple to mod complex conversation pt was noted to decrease efficacy in verbal expression with more demand linguistically or a need to communicate faster. SLP cued pt that he will need to be especially in tune wiht the need to reduce rate in this kind of conversation.   Assessment / Recommendations / Plan   Plan Continue with current plan of care   Progression Toward Goals   Progression toward goals  Progressing toward goals          SLP Education - 02/11/15 1148    Education provided Yes   Education Details compensations for verbal expression, need to use compensations especially in situations requiring more demand, linguisitically   Person(s) Educated Patient   Methods Explanation;Demonstration   Comprehension Verbalized understanding          SLP Short Term Goals - 02/11/15 1128    SLP SHORT TERM GOAL #1   Title pt will describe pictures in detail with 80% success using compensations   Time 3   Period Weeks   Status On-going   SLP SHORT TERM GOAL #2   Title pt will write short lists, and other functional word level tasks, with extra time and rare min A   Time 3   Period Weeks   Status On-going   SLP SHORT TERM GOAL #3   Title pt will tell SLP three compensations for expressive language    Time 3   Period Weeks   Status On-going          SLP Long Term Goals - 02/11/15 1128    SLP LONG TERM GOAL #1   Title pt will functionally participate in 10 minutes mod copmlex conversation using compensations   Time 7   Period Weeks   Status On-going   SLP LONG TERM GOAL #2   Title pt will engage in 8 minutes mod complex-complex conversation with rare min  A   Time 7   Period Weeks   Status On-going   SLP LONG TERM GOAL #3   Title pt will write short notes with rare min A   Time 7   Period Weeks   Status On-going          Plan - 02/11/15 1126    Clinical Impression Statement Pt has improved verbal expression skills since SLP last saw pt, however mild - moderate expressive aphasia cont to persist; worse with more complex linguistic demands. Cont skilled ST needed to maximize pt's expressive languaeg skills.   Speech Therapy Frequency 2x / week   Duration --  7 weeks   Treatment/Interventions SLP instruction and feedback;Compensatory strategies;Patient/family education;Functional tasks;Cueing hierarchy;Language facilitation   Potential to Achieve Goals Good         Problem List Patient Active Problem List   Diagnosis Date Noted  . Aphasia complicating stroke 02/09/2015  . 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 Type 2 diabetes mellitus with peripheral neuropathy (HCC)   . Essential hypertension   . Alcohol withdrawal (HCC)   . Cerebral hemorrhage (HCC)   . Endotracheally intubated   . ICH (intracerebral hemorrhage) (HCC) 12/10/2014    [facility]IN[facility],CARL , MS, CCC-SLP  02/11/2015, 11:49 AM  [facility] [address] [place], [place], [address] Phone: [phone]   Fax:  [phone]   Name: Richard [name] MRN: [id number] Date of Birth: September 25, 1945

## 2015-02-11 NOTE — Patient Instructions (Signed)
  Please complete the assigned speech therapy homework and return it to your next session.  

## 2015-02-16 ENCOUNTER — Ambulatory Visit: Payer: Medicare HMO | Admitting: Occupational Therapy

## 2015-02-17 ENCOUNTER — Telehealth: Payer: Self-pay

## 2015-02-17 NOTE — Telephone Encounter (Signed)
Patients wife called to see if patient is allowed to drive.  According to office note instructions,  It is recommended that the patient first drives with another licensed driver in an empty parking lot. If the patient does well with this, and they can drive on a quiet street with the licensed driver. If the patient does well with this they can drive on a busy street with a licensed driver. If the patient does well with this, the next time out they can go by himself. For the first month after resuming driving, I recommend no nighttime or Interstate driving.         Copy of this mailed to patient.

## 2015-02-23 ENCOUNTER — Ambulatory Visit: Payer: Medicare HMO

## 2015-02-23 ENCOUNTER — Ambulatory Visit: Payer: Medicare HMO | Admitting: Occupational Therapy

## 2015-02-23 DIAGNOSIS — I69351 Hemiplegia and hemiparesis following cerebral infarction affecting right dominant side: Secondary | ICD-10-CM

## 2015-02-23 DIAGNOSIS — R279 Unspecified lack of coordination: Secondary | ICD-10-CM

## 2015-02-23 DIAGNOSIS — R270 Ataxia, unspecified: Secondary | ICD-10-CM

## 2015-02-23 DIAGNOSIS — IMO0002 Reserved for concepts with insufficient information to code with codable children: Secondary | ICD-10-CM

## 2015-02-23 DIAGNOSIS — R278 Other lack of coordination: Secondary | ICD-10-CM

## 2015-02-23 NOTE — Patient Instructions (Addendum)
1. Grip Strengthening (Resistive Putty)   Squeeze putty using thumb and all fingers. Repeat 25 times. Do 1 sessions per day.   Extension (Assistive Putty)   Roll putty back and forth, being sure to use all fingertips. Repeat 3 times. Do 1 sessions per day.  Then pinch as below.   Palmar Pinch Strengthening (Resistive Putty)   Pinch putty between thumb and each fingertip in turn after rolling out    Finger and Thumb Extension (Resistive Putty)   With thumb and all fingers in center of putty donut, stretch out. Repeat 5-10 times. Do 1 sessions per day.      **Then place beads, coins, buttons in putty and remove with just your right hand.

## 2015-02-23 NOTE — Patient Instructions (Signed)
Speech Exercises  Repeat 3 times, 2-4 times a day  Call the cat "Buttercup" A calendar of [place], [place] Four floors to cover Yellow oil ointment Fellow lovers of felines Catastrophe in [place] Plump plumbers' plums The church's chimes chimed Telling time 'til eleven Five valve levers Keep the gate closed Go see that guy Fat cows give milk [place] [employer] Fat frogs flip freely Tuck Tommy into bed Get that game to Greg Thick thistles stick together Cinnamon aluminum linoleum Black bugs blood Lovely lemon linament Red leather, yellow leather  Big grocery buggy    Purple baby carriage [place] [employer] Proper copper coffee pot Ripe purple cabbage Three free throws Tim Tebow tackled  [place] Dave dipped the dessert  [employer] Devils Buckle that bracket    The gospel of Mark Shirts shrink, shells shouldn't [place] 49ers Take the tackle box File the flash message Give me five flapjacks Fundamental relatives Dye the pets purple Talking turkey time after time Dark chocolate chunks Political landscape of the kingdom Electrical engineering genius We played yo-yos yesterday  

## 2015-02-23 NOTE — Therapy (Signed)
[place] [address] [place], [place], [address] Phone: [phone]   Fax:  [phone]  Occupational Therapy Treatment  Patient Details  Name: Richard [name] MRN: [id number] Date of Birth: [birth year] Referring Provider: Dr. Alysia Penna  Encounter Date: 02/23/2015      OT End of Session - 02/23/15 0856    Visit Number 6   Number of Visits 17   Date for OT Re-Evaluation 03/23/15   Authorization Type [employer] Medicare, no visit limit, no auth, G-code   Authorization - Visit Number 6   Authorization - Number of Visits 10   OT Start Time [id number]   OT Stop Time 0930   OT Time Calculation (min) 38 min   Activity Tolerance Patient tolerated treatment well   Behavior During Therapy [facility] for tasks assessed/performed  easily frustrated      Past Medical History  Diagnosis Date  . Diabetes mellitus without complication ([employer])   . Stroke [facility])     No past surgical history on file.  There were no vitals filed for this visit.  Visit Diagnosis:  Hemiplegia and hemiparesis following cerebral infarction affecting right dominant side (HCC)  Decreased coordination  Ataxia      Subjective Assessment - 02/23/15 0854    Subjective  Pt reports pain is worse when he is picking objects up   Patient Stated Goals improve RUE functional use   Currently in Pain? Yes   Pain Score 5    Pain Location Hand   Pain Orientation Right   Pain Descriptors / Indicators Tingling;Pins and needles   Pain Frequency Intermittent   Aggravating Factors  use   Pain Relieving Factors nothing                      OT Treatments/Exercises (OP) - 02/23/15 0001    ADLs   Overall ADLs Checked STGs and discussed progress--see goals section.  Pt reports that he may want to decr frequency due to financial concerns.  Pt to check on copays/out of pocket max for the year to see if he wants to decr frequency.   Fine Motor Coordination   Small Pegboard Picking up and placing small pegs in pegboard to copy design with min-mod difficulty with coordination.  Copied design with good accuracy but needed incr time.                  OT Education - 02/23/15 4268    Education Details Green putty HEP   Person(s) Educated Patient   Methods Explanation;Demonstration   Comprehension Verbalized understanding;Returned demonstration          OT Short Term Goals - 02/23/15 0857    OT SHORT TERM GOAL #1   Title Pt will be independent with initial HEP.--check STGs 02/21/16   Time 4   Period Weeks   Status Achieved   OT SHORT TERM GOAL #2   Title Pt will demo improved RUE coordination as shown by improving score on box and blocks test by at least 8.   Baseline R-28 blocks, L-41 blocks   Time 4   Period Weeks   Status On-going  02/23/15:  33blocks   OT SHORT TERM GOAL #3   Title Pt will demo improved R grip strength by at least 8lbs to assist in lifting/gripping for ADLs.   Baseline R-56lbs, L-77lbs   Time 4   Period Weeks   Status Achieved  02/23/15:  79, 86lbs   OT SHORT  TERM GOAL #4   Title Pt will eat with RUE at least 50% of the time.   Baseline approx 20%   Time 4   Period Weeks   Status Achieved  02/11/15:  using RUE for all eating except cutting meat   OT SHORT TERM GOAL #5   Title Pt will write name with at least 75% legibility.   Baseline 20%   Time 4   Period Weeks   Status Achieved  02/03/15  100% printing           OT Long Term Goals - 02/11/15 1201    OT LONG TERM GOAL #1   Title Pt will be independent with updated HEP.--check LTGs 03/23/15   Time 8   Period Weeks   Status New   OT LONG TERM GOAL #2   Title Pt will improve RUE coordination for ADLs as shown by completing 9-hole peg test in 90sec or less.   Baseline only placed 5 pegs in within 2 min   Time 8   Period Weeks   Status New   OT LONG TERM GOAL #3   Title Pt will perform simple cooking/cleaning task mod I.   Baseline not  performing   Time 8   Period Weeks   Status New   OT LONG TERM GOAL #4   Title Pt will use dominant RUE for brushing teeth/eating at least 90% of the time.   Time 8   Period Weeks   Status Achieved  02/11/15   OT LONG TERM GOAL #5   Title Pt will be able to complete clothing fasteners and cut food using AE prn.   Time 8   Period Weeks   Status New   OT LONG TERM GOAL #6   Title Pt will be able to write simple sentence and name with 90% legibility.   Time 8   Period Weeks   Status Achieved  02/03/15 (printing)               Plan - 02/23/15 0856    Clinical Impression Statement Pt continues to progress with coordination.  Pt met 4/5 STGs and is progressing towards remaining goals.   Plan update coordination HEP; pt may decr frequency due to financial concerns   OT Home Exercise Plan Education provided:  coordination, HEP, green putty HEP   Consulted and Agree with Plan of Care Patient        Problem List Patient Active Problem List   Diagnosis Date Noted  . Aphasia complicating stroke 07/62/2633  . 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 Type 2 diabetes mellitus with peripheral neuropathy (HCC)   . Essential hypertension   . Alcohol withdrawal ([place])   . Cerebral hemorrhage ([place])   . Endotracheally intubated   . ICH (intracerebral hemorrhage) ([place]) 12/10/2014    [facility] 02/23/2015, 10:54 AM  [place] [address] [place], [place], [address] Phone: [phone]   Fax:  [phone]  Name: Richard [name] MRN: [id number] Date of Birth: 05-13-1945  Vianne Bulls, OTR/L 02/23/2015 10:55 AM

## 2015-02-23 NOTE — Therapy (Signed)
[facility] [address] [place], [place], [address] Phone: [phone]   Fax:  [phone]  Speech Language Pathology Treatment  Patient Details  Name: Richard JakschLeon [name] MRN: [id number] Date of Birth: 11/02/1945 Referring Provider: Claudette LawsKirsteins, Andrew, MD  Encounter Date: 02/23/2015      End of Session - 02/23/15 0928    Visit Number 4   Number of Visits 17   Date for SLP Re-Evaluation 03/27/15   SLP Start Time 0805   SLP Stop Time  0846   SLP Time Calculation (min) 41 min   Activity Tolerance Patient tolerated treatment well      Past Medical History  Diagnosis Date  . Diabetes mellitus without complication (HCC)   . Stroke [facility](HCC)     No past surgical history on file.  There were no vitals filed for this visit.  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               ADULT SLP TREATMENT - 02/23/15 0810    General Information   Behavior/Cognition Alert;Cooperative;Pleasant mood   Treatment Provided   Treatment provided Cognitive-Linquistic   Cognitive-Linquistic Treatment   Treatment focused on Aphasia   Skilled Treatment SLP targeted pt's compensatory strategies, specifically reduced rate, in multisentence responses. Pt remarked it would have been more helpful if he would have thought more of his response prior to speaking,. Pt also told SLP slowing rate and using synonyms are compensations that he uses. He described pictures with 85% success with speech/lanugage fluidity using compensations. In conversation pt was more successful than last week with using slower rate.   Assessment / Recommendations / Plan   Plan Continue with current plan of care   Progression Toward Goals   Progression toward goals Progressing toward goals          SLP Education - 02/23/15 0850     Education provided Yes   Education Details HEP ("tongue twisters")   Person(s) Educated Patient   Methods Explanation;Demonstration;Handout;Verbal cues   Comprehension Verbalized understanding;Returned demonstration;Verbal cues required          SLP Short Term Goals - 02/23/15 0844    SLP SHORT TERM GOAL #1   Title pt will describe pictures in detail with 80% success using compensations   Time --   Period --   Status Achieved   SLP SHORT TERM GOAL #2   Title pt will write short lists, and other functional word level tasks, with extra time and rare min A   Time 2   Period Weeks   Status On-going   SLP SHORT TERM GOAL #3   Title pt will tell SLP three compensations for expressive language    Time --   Period --   Status Achieved          SLP Long Term Goals - 02/23/15 0850    SLP LONG TERM GOAL #1   Title pt will functionally participate in 10 minutes mod copmlex conversation using compensations   Time 6   Period Weeks   Status On-going   SLP LONG TERM GOAL #2   Title pt will engage in 8 minutes mod complex-complex conversation with rare min A   Time 6   Period Weeks   Status On-going   SLP LONG TERM GOAL #3   Title pt will write short notes  with rare min A   Time 6   Period Weeks   Status On-going          Plan - 02/23/15 1610    Clinical Impression Statement Pt has improved verbal expression skills since SLP last saw pt, however mild - moderate expressive aphasia cont to persist; worse with more complex linguistic demands. Cont skilled ST needed to maximize pt's expressive languaeg skills.   Speech Therapy Frequency 2x / week   Duration --  6 weeks   Treatment/Interventions SLP instruction and feedback;Compensatory strategies;Patient/family education;Functional tasks;Cueing hierarchy;Language facilitation   Potential to Achieve Goals Good        Problem List Patient Active Problem List   Diagnosis Date Noted  . Aphasia complicating stroke 02/09/2015   . 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 Type 2 diabetes mellitus with peripheral neuropathy (HCC)   . Essential hypertension   . Alcohol withdrawal (HCC)   . Cerebral hemorrhage (HCC)   . Endotracheally intubated   . ICH (intracerebral hemorrhage) (HCC) 12/10/2014    [facility] , MS, CCC-SLP  02/23/2015, 9:29 AM  [facility] [address] [place], [place], [address] Phone: [phone]   Fax:  [phone]   Name: Richard [name] MRN: [id number] Date of Birth: 10-02-1945

## 2015-02-24 ENCOUNTER — Ambulatory Visit: Payer: Medicare HMO | Admitting: Occupational Therapy

## 2015-02-24 ENCOUNTER — Ambulatory Visit: Payer: Medicare HMO

## 2015-02-24 DIAGNOSIS — IMO0002 Reserved for concepts with insufficient information to code with codable children: Secondary | ICD-10-CM

## 2015-02-24 DIAGNOSIS — I69351 Hemiplegia and hemiparesis following cerebral infarction affecting right dominant side: Secondary | ICD-10-CM | POA: Diagnosis not present

## 2015-02-24 NOTE — Patient Instructions (Signed)
  Please complete the assigned speech therapy homework and return it to your next session.  

## 2015-02-24 NOTE — Therapy (Signed)
[facility] [address] [place], [place], [address] Phone: [phone]   Fax:  [phone]  Speech Language Pathology Treatment  Patient Details  Name: Richard [name] MRN: [id number] Date of Birth: Jul 27, 1945 Referring Provider: Claudette LawsKirsteins, Andrew, MD  Encounter Date: 02/24/2015      End of Session - 02/24/15 1020    Visit Number 5   Number of Visits 17   Date for SLP Re-Evaluation 03/27/15   SLP Start Time 0933   SLP Stop Time  1016   SLP Time Calculation (min) 43 min   Activity Tolerance Patient tolerated treatment well      Past Medical History  Diagnosis Date  . Diabetes mellitus without complication (HCC)   . Stroke [facility](HCC)     No past surgical history on file.  There were no vitals filed for this visit.  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      Subjective Assessment - 02/24/15 0942    Subjective Pt reports he is having difficulty reading correct words on HEP. SLP encouraged him to take his time.                ADULT SLP TREATMENT - 02/24/15 0943    General Information   Behavior/Cognition Alert;Cooperative;Pleasant mood   Treatment Provided   Treatment provided Cognitive-Linquistic   Pain Assessment   Pain Assessment 0-10   Pain Score 3    Pain Location rt shoulder   Pain Descriptors / Indicators Aching   Pain Intervention(s) Monitored during session   Cognitive-Linquistic Treatment   Treatment focused on Aphasia   Skilled Treatment In picture description tasks targeting pt's verbal expression (8-step sequences) pt demo'd decr'd rate approx 65% of the time and subsequently had success with speech fluency approx 80% of the time with these detailed descriptions. Pt benefitted from SLP min-mod A rarely.    Assessment / Recommendations / Plan   Plan Continue with current plan of care   Progression Toward Goals   Progression toward goals Progressing toward goals          SLP  Education - 02/23/15 0850    Education provided Yes   Education Details HEP ("tongue twisters")   Person(s) Educated Patient   Methods Explanation;Demonstration;Handout;Verbal cues   Comprehension Verbalized understanding;Returned demonstration;Verbal cues required          SLP Short Term Goals - 02/24/15 1021    SLP SHORT TERM GOAL #1   Title pt will describe pictures in detail with 80% success using compensations   Status Achieved   SLP SHORT TERM GOAL #2   Title pt will write short lists, and other functional word level tasks, with extra time and rare min A   Time 2   Period Weeks   Status On-going   SLP SHORT TERM GOAL #3   Title pt will tell SLP three compensations for expressive language    Status Achieved          SLP Long Term Goals - 02/24/15 1021    SLP LONG TERM GOAL #1   Title pt will functionally participate in 10 minutes mod copmlex conversation using compensations   Time 6   Period Weeks   Status On-going   SLP LONG TERM GOAL #2   Title pt will engage in 8 minutes mod complex-complex conversation with rare min A   Time 6   Period Weeks   Status On-going   SLP LONG TERM GOAL #3   Title pt will write  short notes with rare min A   Time 6   Period Weeks   Status On-going          Plan - 02/24/15 1020    Clinical Impression Statement Pt has improved verbal expression skills since SLP last saw pt, however mild - moderate expressive aphasia cont to persist; worse with more complex linguistic demands. Cont skilled ST needed to maximize pt's expressive languaeg skills.   Speech Therapy Frequency 2x / week   Duration --  6 weeks   Treatment/Interventions SLP instruction and feedback;Compensatory strategies;Patient/family education;Functional tasks;Cueing hierarchy;Language facilitation   Potential to Achieve Goals Good        Problem List Patient Active Problem List   Diagnosis Date Noted  . Aphasia complicating stroke 02/09/2015  . Alterations of  sensations following CVA (cerebrovascular accident) 12/23/2014  . 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 Type 2 diabetes mellitus with peripheral neuropathy (HCC)   . Essential hypertension   . Alcohol withdrawal (HCC)   . Cerebral hemorrhage (HCC)   . Endotracheally intubated   . ICH (intracerebral hemorrhage) (HCC) 12/10/2014    [facility] , MS, CCC-SLP  02/24/2015, 10:21 AM  [facility] [address] [place], [place], [address] Phone: [phone]   Fax:  [phone]   Name: Richard [name] MRN: [id number] Date of Birth: May 30, 1945

## 2015-03-02 ENCOUNTER — Ambulatory Visit: Payer: PPO | Admitting: Occupational Therapy

## 2015-03-02 ENCOUNTER — Ambulatory Visit: Payer: PPO | Attending: Physical Medicine & Rehabilitation

## 2015-03-02 ENCOUNTER — Encounter: Payer: Medicare HMO | Admitting: Occupational Therapy

## 2015-03-02 ENCOUNTER — Ambulatory Visit: Payer: PPO

## 2015-03-02 DIAGNOSIS — I69320 Aphasia following cerebral infarction: Secondary | ICD-10-CM | POA: Diagnosis present

## 2015-03-02 DIAGNOSIS — IMO0002 Reserved for concepts with insufficient information to code with codable children: Secondary | ICD-10-CM

## 2015-03-02 DIAGNOSIS — R279 Unspecified lack of coordination: Secondary | ICD-10-CM | POA: Insufficient documentation

## 2015-03-02 DIAGNOSIS — I69351 Hemiplegia and hemiparesis following cerebral infarction affecting right dominant side: Secondary | ICD-10-CM | POA: Diagnosis present

## 2015-03-02 NOTE — Patient Instructions (Signed)
  Please complete the assigned speech therapy homework and return it to your next session.  

## 2015-03-02 NOTE — Therapy (Signed)
[facility] [address] [place], [place], [address] Phone: [phone]   Fax:  [phone]  Speech Language Pathology Treatment  Patient Details  Name: Richard JakschLeon [name] MRN: [id number] Date of Birth: 20-Jan-1946 Referring Provider: Claudette LawsKirsteins, Andrew, MD  Encounter Date: 03/02/2015      End of Session - 03/02/15 1058    Visit Number 6   Number of Visits 17   Date for SLP Re-Evaluation 03/27/15   SLP Start Time 1019   SLP Stop Time  1102   SLP Time Calculation (min) 43 min   Activity Tolerance Patient tolerated treatment well      Past Medical History  Diagnosis Date  . Diabetes mellitus without complication (HCC)   . Stroke [facility](HCC)     No past surgical history on file.  There were no vitals filed for this visit.  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               ADULT SLP TREATMENT - 03/02/15 1042    General Information   Behavior/Cognition Alert;Cooperative;Pleasant mood   Treatment Provided   Treatment provided Cognitive-Linquistic   Pain Assessment   Pain Assessment 0-10   Pain Score 3    Pain Location rt shoulder   Pain Descriptors / Indicators Aching   Pain Intervention(s) Monitored during session   Cognitive-Linquistic Treatment   Treatment focused on Aphasia   Skilled Treatment Pt told SLP he had not been completing HEP as directed. He read sentences/phrases ("tongue twisters") with approx 75% accuracy on first attempt. Pt's addtional attempts increased overall accuracy to approx 80%. Pt repeated these same sentences with >95% accuracy. In divergent naming tasks (simple, concrete), pt demo'd 90% accuracy with articulation, and SLP mod cues for naming after approx 10 items.   Assessment / Recommendations / Plan   Plan Continue with current plan of care   Progression Toward Goals   Progression toward goals Progressing toward goals          SLP Education - 03/02/15 1057    Education provided Yes   Education Details compensations ofr anomia/dysnomia   Person(s) Educated Patient   Methods Explanation;Demonstration   Comprehension Verbalized understanding;Returned demonstration          SLP Short Term Goals - 03/02/15 1103    SLP SHORT TERM GOAL #1   Title pt will describe pictures in detail with 80% success using compensations   Status Achieved   SLP SHORT TERM GOAL #2   Title pt will write short lists, and other functional word level tasks, with extra time and rare min A   Time 1   Period Weeks   Status On-going   SLP SHORT TERM GOAL #3   Title pt will tell SLP three compensations for expressive language    Status Achieved          SLP Long Term Goals - 03/02/15 1103    SLP LONG TERM GOAL #1   Title pt will functionally participate in 10 minutes mod copmlex conversation using compensations   Time 5   Period Weeks   Status On-going   SLP LONG TERM GOAL #2   Title pt will engage in 8 minutes mod complex-complex conversation with rare min A   Time 5   Period Weeks   Status On-going   SLP LONG TERM GOAL #3  Title pt will write short notes with rare min A   Time 5   Period Weeks   Status On-going          Plan - 03/02/15 1102    Clinical Impression Statement Mild - moderate expressive aphasia cont to persist; worse with more complex linguistic demands. Cont skilled ST needed to maximize pt's expressive language skills.   Speech Therapy Frequency 2x / week   Duration --  5 weeks   Treatment/Interventions SLP instruction and feedback;Compensatory strategies;Patient/family education;Functional tasks;Cueing hierarchy;Language facilitation   Potential to Achieve Goals Good        Problem List Patient Active Problem List   Diagnosis Date Noted  . Aphasia complicating stroke 02/09/2015  . Alterations of  sensations following CVA (cerebrovascular accident) 12/23/2014  . 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 Type 2 diabetes mellitus with peripheral neuropathy (HCC)   . Essential hypertension   . Alcohol withdrawal (HCC)   . Cerebral hemorrhage (HCC)   . Endotracheally intubated   . ICH (intracerebral hemorrhage) (HCC) 12/10/2014    [facility] , MS, CCC-SLP  03/02/2015, 11:04 AM  [facility] [address] [place], [place], [address] Phone: [phone]   Fax:  [phone]   Name: Richard [name] MRN: [id number] Date of Birth: 13-Jun-1945

## 2015-03-05 ENCOUNTER — Ambulatory Visit: Payer: PPO

## 2015-03-05 ENCOUNTER — Encounter: Payer: Medicare HMO | Admitting: Occupational Therapy

## 2015-03-05 DIAGNOSIS — IMO0002 Reserved for concepts with insufficient information to code with codable children: Secondary | ICD-10-CM

## 2015-03-05 DIAGNOSIS — I69320 Aphasia following cerebral infarction: Secondary | ICD-10-CM | POA: Diagnosis not present

## 2015-03-05 NOTE — Therapy (Signed)
[facility] [address] [place], [place], [address] Phone: [phone]   Fax:  [phone]  Speech Language Pathology Treatment  Patient Details  Name: Richard JakschLeon Cunnington MRN: [id number] Date of Birth: [birth year] Referring Provider: Claudette LawsKirsteins, Andrew, MD  Encounter Date: 03/05/2015      End of Session - 03/05/15 1059    Visit Number 7   Number of Visits 17   SLP Start Time 0933   SLP Stop Time  1016   SLP Time Calculation (min) 43 min   Activity Tolerance Patient tolerated treatment well      Past Medical History  Diagnosis Date  . Diabetes mellitus without complication (HCC)   . Stroke [facility](HCC)     No past surgical history on file.  There were no vitals filed for this visit.  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               ADULT SLP TREATMENT - 03/05/15 0946    General Information   Behavior/Cognition Alert;Cooperative;Pleasant mood   Treatment Provided   Treatment provided Cognitive-Linquistic   Cognitive-Linquistic Treatment   Treatment focused on Aphasia   Skilled Treatment Pt reports his percentage of speech returned to be approx 60-65%, much like previous session. Pt expressed frutstration with homework for providing definitions for dual meaning words, so SLP facilitated verbal expression in mod complex expressive tasks today- to provide two meanings for words with 75% success, and incr'd to 95% success with mod SLP cues. Pt's writing was non-aphasic in nature.   Assessment / Recommendations / Plan   Plan Continue with current plan of care   Progression Toward Goals   Progression toward goals Progressing toward goals            SLP Short Term Goals - 03/05/15 0948    SLP  SHORT TERM GOAL #1   Title pt will describe pictures in detail with 80% success using compensations   Status Achieved   SLP SHORT TERM GOAL #2   Title pt will write short lists, and other functional word level tasks, with extra time and rare min A   Time --   Period --   Status Achieved   SLP SHORT TERM GOAL #3   Title pt will tell SLP three compensations for expressive language    Status Achieved          SLP Long Term Goals - 03/05/15 0949    SLP LONG TERM GOAL #1   Title pt will functionally participate in 10 minutes mod copmlex conversation using compensations   Time 5   Period Weeks   Status On-going   SLP LONG TERM GOAL #2   Title pt will engage in 8 minutes mod complex-complex conversation with rare min A   Time 5   Period Weeks   Status On-going   SLP LONG TERM GOAL #3   Title pt will write short notes with rare min A   Time 5   Period Weeks   Status On-going          Plan - 03/05/15 1059    Clinical Impression Statement 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 Cont skilled ST needed to maximize pt's expressive language skills.  He expressed frustration with homework (mod complex linguistic task).   Speech Therapy Frequency 2x / week   Duration --  5 weeks   Treatment/Interventions SLP instruction and feedback;Compensatory strategies;Patient/family education;Functional tasks;Cueing hierarchy;Language facilitation   Potential to Achieve Goals Good        Problem List Patient Active Problem List   Diagnosis Date Noted  . Aphasia complicating stroke 02/09/2015  . 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 Type 2 diabetes mellitus with peripheral neuropathy (HCC)   . Essential hypertension   . Alcohol withdrawal (HCC)   . Cerebral hemorrhage (HCC)   . Endotracheally intubated   . ICH (intracerebral hemorrhage) (HCC) 12/10/2014    SCHINKE,CARL  , MS, CCC-SLP  03/05/2015, 11:01 AM  [facility] [address] [place], [place], [address] Phone: [phone]   Fax:  [phone]   Name: James Senn MRN: [id number] Date of Birth: 03-25-1946

## 2015-03-05 NOTE — Patient Instructions (Addendum)
  Please complete the assigned speech therapy homework and return it to your next session. If you have difficulty, as your wife for help, as you say she would be willing to provide help for you.

## 2015-03-09 ENCOUNTER — Ambulatory Visit: Payer: Self-pay | Admitting: Neurology

## 2015-03-09 ENCOUNTER — Ambulatory Visit: Payer: PPO

## 2015-03-09 ENCOUNTER — Encounter: Payer: Medicare HMO | Admitting: Occupational Therapy

## 2015-03-09 ENCOUNTER — Ambulatory Visit: Payer: PPO | Admitting: Occupational Therapy

## 2015-03-09 DIAGNOSIS — I69320 Aphasia following cerebral infarction: Secondary | ICD-10-CM | POA: Diagnosis not present

## 2015-03-09 DIAGNOSIS — R279 Unspecified lack of coordination: Secondary | ICD-10-CM

## 2015-03-09 DIAGNOSIS — R278 Other lack of coordination: Secondary | ICD-10-CM

## 2015-03-09 DIAGNOSIS — I69351 Hemiplegia and hemiparesis following cerebral infarction affecting right dominant side: Secondary | ICD-10-CM

## 2015-03-09 NOTE — Therapy (Signed)
[facility] [address] [place], [place], [address] Phone: [phone]   Fax:  [phone]  Occupational Therapy Treatment  Patient Details  Name: Colie Fugitt MRN: [id number] Date of Birth: 1946-01-03 Referring Provider: Dr. Claudette Laws  Encounter Date: 03/09/2015      OT End of Session - 03/09/15 0907    Visit Number 7   Number of Visits 17   Date for OT Re-Evaluation 03/23/15   Authorization Type [employer], no visit limit, no auth, G-code   Authorization - Visit Number 7   Authorization - Number of Visits 10   OT Start Time [phone]   OT Stop Time 0930   OT Time Calculation (min) 38 min   Activity Tolerance Patient tolerated treatment well   Behavior During Therapy [facility] for tasks assessed/performed  easily frustrated      Past Medical History  Diagnosis Date  . Diabetes mellitus without complication (HCC)   . Stroke [facility])     No past surgical history on file.  There were no vitals filed for this visit.  Visit Diagnosis:  Hemiplegia and hemiparesis following cerebral infarction affecting right dominant side (HCC)  Decreased coordination      Subjective Assessment - 03/09/15 0908    Subjective  "It's better"   Patient Stated Goals improve RUE functional use   Currently in Pain? No/denies                      OT Treatments/Exercises (OP) - 03/09/15 0001    ADLs   Overall ADLs checked goals and discussed progress.   Fine Motor Coordination   Fine Motor Coordination Dealing card with thumb;Manipulating coins;Stacking coins   In Hand Manipulation Training Rotating 2 balls in hand with min-mod difficulty   Dealing card with thumb with mod difficulty and min cues   Manipulating coins manipulating in hand to count out one at a time to place in coin bank with mod difficulty   Stacking coins with mod difficulty   Other Fine Motor Exercises flipping card between each finger with mod  difficulty and min cues                OT Education - 03/09/15 0913    Education Details updated coordination (see pt instructions-added exercises at the bottom)   Person(s) Educated Patient   Methods Explanation;Handout;Demonstration;Verbal cues   Comprehension Verbalized understanding;Returned demonstration          OT Short Term Goals - 03/09/15 0854    OT SHORT TERM GOAL #1   Title Pt will be independent with initial HEP.--check STGs 02/21/16   Time 4   Period Weeks   Status Achieved   OT SHORT TERM GOAL #2   Title Pt will demo improved RUE coordination as shown by improving score on box and blocks test by at least 8.   Baseline R-28 blocks, L-41 blocks   Time 4   Period Weeks   Status Achieved  02/23/15:  33blocks; 03/09/15 39 blocks   OT SHORT TERM GOAL #3   Title Pt will demo improved R grip strength by at least 8lbs to assist in lifting/gripping for ADLs.   Baseline R-56lbs, L-77lbs   Time 4   Period Weeks   Status Achieved  02/23/15:  79, 86lbs   OT SHORT TERM GOAL #4   Title Pt will eat with RUE at least 50% of the time.   Baseline approx 20%   Time 4  Period Weeks   Status Achieved  02/11/15:  using RUE for all eating except cutting meat   OT SHORT TERM GOAL #5   Title Pt will write name with at least 75% legibility.   Baseline 20%   Time 4   Period Weeks   Status Achieved  02/03/15  100% printing           OT Long Term Goals - 03/09/15 0853    OT LONG TERM GOAL #1   Title Pt will be independent with updated HEP.--check LTGs 03/23/15   Time 8   Period Weeks   Status Achieved   OT LONG TERM GOAL #2   Title Pt will improve RUE coordination for ADLs as shown by completing 9-hole peg test in 90sec or less.   Baseline only placed 5 pegs in within 2 min   Time 8   Period Weeks   Status Achieved  03/09/15  76.03sec   OT LONG TERM GOAL #3   Title Pt will perform simple cooking/cleaning task mod I.   Baseline not performing   Time 8    Period Weeks   Status On-going   OT LONG TERM GOAL #4   Title Pt will use dominant RUE for brushing teeth/eating at least 90% of the time.   Time 8   Period Weeks   Status Achieved  02/11/15   OT LONG TERM GOAL #5   Title Pt will be able to complete clothing fasteners and cut food using AE prn.   Time 8   Period Weeks   Status Achieved  03/09/15   OT LONG TERM GOAL #6   Title Pt will be able to write simple sentence and name with 90% legibility.   Time 8   Period Weeks   Status Achieved  02/03/15 (printing)               Plan - 03/09/15 0912    Clinical Impression Statement Pt has made good progress with coordination.   Plan After discusssing progress, pt decided to continue with updated HEP/previous HEPs at home, if pt feels like he is not progressing, pt is to call to schedule within 30 days to update HEP and continue with unmet goal.  If pt does not call within 30 days, pt will be discharged.  Pt verbalized agreement with plan.   OT Home Exercise Plan Education provided:  coordination, HEP, green putty HEP, 03/09/15 updated coordination HEP and considerations for cooking   Consulted and Agree with Plan of Care Patient        Problem List Patient Active Problem List   Diagnosis Date Noted  . Aphasia complicating stroke 02/09/2015  . 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 Type 2 diabetes mellitus with peripheral neuropathy (HCC)   . Essential hypertension   . Alcohol withdrawal (HCC)   . Cerebral hemorrhage (HCC)   . Endotracheally intubated   . ICH (intracerebral hemorrhage) (HCC) 12/10/2014    [facility] Center[facility],Beautiful Pensyl 03/09/2015, 10:02 AM  [facility] [address] [place], [place], [address] Phone: [phone]   Fax:  [phone]  Name: Freddy JakschLeon Ramseyer MRN: [id number] Date of Birth: [birth year]  Willa Fraterngela Myson Levi, OTR/L 03/09/2015  10:02 AM

## 2015-03-09 NOTE — Patient Instructions (Addendum)
Coordination Activities  Perform the following activities for 20 minutes 1-2 times per day with right hand(s).  1. Rotate ball in fingertips (clockwise and counter-clockwise). 2. Toss ball between hands. 3. Toss ball in air and catch with the same hand. 4. Flip cards 1 at a time as fast as you can (deck of cards on table). 5. Place deck of cards on table. Then extend fingers to push cards off top of deck. 6. Deal cards with your thumb (Hold deck in hand and push card off top with thumb). 7. Pick up coins, buttons, marbles, dried beans/pasta of different sizes and place in container. 8. Pick up coins and place in container or coin bank. 9. Practice writing  10. Screw together nuts and bolts, then unfasten.   - Rotate 2 golf balls in right hand  - Flip card between each finger  - Stack coins 1 at time - Lace shoelace, tie continuous knots, tie a tie - Pick up coins until you get 5 in your hand, then move from palm to fingertips to place in container one at a time.

## 2015-03-12 ENCOUNTER — Encounter: Payer: Medicare HMO | Admitting: Occupational Therapy

## 2015-03-12 ENCOUNTER — Ambulatory Visit: Payer: PPO

## 2015-03-12 DIAGNOSIS — IMO0002 Reserved for concepts with insufficient information to code with codable children: Secondary | ICD-10-CM

## 2015-03-12 DIAGNOSIS — I69320 Aphasia following cerebral infarction: Secondary | ICD-10-CM | POA: Diagnosis not present

## 2015-03-12 NOTE — Therapy (Signed)
[facility] [address] [place], [place], [address] Phone: [phone]   Fax:  [phone]  Speech Language Pathology Treatment  Patient Details  Name: Richard [name] MRN: [id number] Date of Birth: 1945-04-06 Referring Provider: Claudette LawsKirsteins, Andrew, MD  Encounter Date: 03/12/2015      End of Session - 03/12/15 1046    Visit Number 8   Number of Visits 17   Date for SLP Re-Evaluation 03/27/15   SLP Start Time 0936   SLP Stop Time  1017   SLP Time Calculation (min) 41 min   Activity Tolerance Patient tolerated treatment well      Past Medical History  Diagnosis Date  . Diabetes mellitus without complication (HCC)   . Stroke [facility](HCC)     No past surgical history on file.  There were no vitals filed for this visit.  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 Phonemic paraphasias observed rarely. SLP reiterated to pt that simplifying his message can be a strategy while his brain recovers from his CVA, as well as encouraging slower rate of speech to provide his brain more time for sentence formulation. To facilitate associations between words pt generated terms/objects associated with a word with mod A from SLP consistently. Anomia present in this task, hindered progress.    Assessment / Recommendations / Plan   Plan Continue with current plan of care   Progression  Toward Goals   Progression toward goals Progressing toward goals          SLP Education - 03/12/15 1045    Education provided Yes   Education Details compensations (reduced rate, simplifying message)   Person(s) Educated Patient   Methods Explanation   Comprehension Verbalized understanding;Returned demonstration;Verbal cues required          SLP Short Term Goals - 03/05/15 0948    SLP SHORT TERM GOAL #1   Title pt will describe pictures in detail with 80% success using compensations   Status Achieved   SLP SHORT TERM GOAL #2   Title pt will write short lists, and other functional word level tasks, with extra time and rare min A   Time --   Period --   Status Achieved   SLP SHORT TERM GOAL #3   Title pt will tell SLP three compensations for expressive language    Status Achieved          SLP Long Term Goals - 03/12/15 1050    SLP LONG TERM GOAL #1   Title pt will functionally participate in 10 minutes mod copmlex conversation using compensations   Time 4   Period Weeks   Status On-going   SLP LONG TERM GOAL #2   Title pt will engage in 8 minutes mod complex-complex conversation with rare min A   Time 4   Period Weeks   Status On-going   SLP LONG TERM GOAL #3  Title pt will write short notes with rare min A   Time 4   Period Weeks   Status On-going          Plan - 03/12/15 1047    Clinical Impression Statement 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 Pt needed reminding about compensations today. Cont skilled ST needed to maximize pt's expressive language skills.   Speech Therapy Frequency 2x / week   Duration 4 weeks   Treatment/Interventions SLP instruction and feedback;Compensatory strategies;Patient/family education;Functional tasks;Cueing hierarchy;Language facilitation   Potential to Achieve Goals Good        Problem List Patient Active Problem List   Diagnosis Date Noted  . Aphasia complicating stroke  02/09/2015  . 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 Type 2 diabetes mellitus with peripheral neuropathy (HCC)   . Essential hypertension   . Alcohol withdrawal (HCC)   . Cerebral hemorrhage (HCC)   . Endotracheally intubated   . ICH (intracerebral hemorrhage) (HCC) 12/10/2014    [facility], MS, CCC-SLP 03/12/2015, 10:51 AM  [facility] [address] [place], [place], [address] Phone: [phone]   Fax:  [phone]   Name: Richard [name] MRN: [id number] Date of Birth: 11/28/1945

## 2015-03-16 ENCOUNTER — Encounter: Payer: Medicare HMO | Admitting: Occupational Therapy

## 2015-03-16 ENCOUNTER — Ambulatory Visit: Payer: PPO

## 2015-03-16 DIAGNOSIS — IMO0002 Reserved for concepts with insufficient information to code with codable children: Secondary | ICD-10-CM

## 2015-03-16 DIAGNOSIS — I69320 Aphasia following cerebral infarction: Secondary | ICD-10-CM | POA: Diagnosis not present

## 2015-03-16 NOTE — Therapy (Signed)
[facility] [address] [place], [place], [address] Phone: [phone]   Fax:  [phone]  Speech Language Pathology Treatment  Patient Details  Name: Richard [name] MRN: [id number] Date of Birth: 04-21-1945 Referring Provider: Claudette Laws, MD  Encounter Date: 03/16/2015      End of Session - 03/16/15 1017    Visit Number 9   Number of Visits 17   Date for SLP Re-Evaluation 03/27/15   SLP Start Time 0933   SLP Stop Time  1016   SLP Time Calculation (min) 43 min   Activity Tolerance Patient tolerated treatment well      Past Medical History  Diagnosis Date  . Diabetes mellitus without complication (HCC)   . Stroke [facility])     No past surgical history on file.  There were no vitals filed for this visit.  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      Subjective Assessment - 03/16/15 0938    Subjective "Not alot." pt, re: how much talking he did over the weekend due to feeling ill.               ADULT SLP TREATMENT - 03/16/15 0939    General Information   Behavior/Cognition Alert;Cooperative;Pleasant mood   Pain Assessment   Pain Assessment 0-10   Pain Score 2    Pain Location shoulders   Pain Descriptors / Indicators Nagging   Pain Intervention(s) Monitored during session   Cognitive-Linquistic Treatment   Treatment focused on Aphasia   Skilled Treatment Homework not worked on due to pt's ill-feeling over weekend. SLP facilitated pt's verbal expression in sentence tasks, with SLP reminding pt of slower rate of speech to provide his brain more time for sentence formulation. Extra time consistently necessary. In mod complex similarities and differences pt req'd min-mod A from SLP occasionally to complete task. SLP also reminded pt to simlify his language to make verbal exression less tedious. To facilitate associations between words pt generated terms/objects associated with a word with mod A from  SLP consistently for items >2, with pt being encouraged for more specifiic items instead of general. Anomia seen in sentence tasks so SLP had pt think of 5-7 mod complex items in categories. Pt req'd SLP mod A  rarely to generate >3 items.     Assessment / Recommendations / Plan   Plan Continue with current plan of care   Progression Toward Goals   Progression toward goals Progressing toward goals            SLP Short Term Goals - 03/05/15 0948    SLP SHORT TERM GOAL #1   Title pt will describe pictures in detail with 80% success using compensations   Status Achieved   SLP SHORT TERM GOAL #2   Title pt will write short lists, and other functional word level tasks, with extra time and rare min A   Time --   Period --   Status Achieved   SLP SHORT TERM GOAL #3   Title pt will tell SLP three compensations for expressive language    Status Achieved          SLP Long Term Goals - 03/16/15 1242    SLP LONG TERM GOAL #1   Title pt will functionally participate in 10 minutes mod copmlex conversation using compensations   Time 3   Period Weeks   Status On-going   SLP LONG TERM GOAL #2   Title pt will engage in 8  minutes mod complex-complex conversation with rare min A   Time 3   Period Weeks   Status On-going   SLP LONG TERM GOAL #3   Title pt will write short notes with rare min A   Time 3   Period Weeks   Status On-going          Plan - 03/16/15 1224    Clinical Impression Statement 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 Pt needed reminding about compensations today. Cont skilled ST needed to maximize pt's expressive language skills.   Speech Therapy Frequency 2x / week   Duration 4 weeks  3 weeks   Treatment/Interventions SLP instruction and feedback;Compensatory strategies;Patient/family education;Functional tasks;Cueing hierarchy;Language facilitation   Potential to Achieve Goals Good        Problem List Patient  Active Problem List   Diagnosis Date Noted  . Aphasia complicating stroke 02/09/2015  . 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 Type 2 diabetes mellitus with peripheral neuropathy (HCC)   . Essential hypertension   . Alcohol withdrawal (HCC)   . Cerebral hemorrhage (HCC)   . Endotracheally intubated   . ICH (intracerebral hemorrhage) (HCC) 12/10/2014    [facility],Thurston Brendlinger , MS, CCC-SLP  03/16/2015, 12:43 PM  [facility] [address] [place], [place], [address] Phone: [phone]   Fax:  [phone]   Name: Richard [name] MRN: [id number] Date of Birth: [birth year]

## 2015-03-16 NOTE — Patient Instructions (Signed)
  Please complete the assigned speech therapy homework and return it to your next session.  

## 2015-03-19 ENCOUNTER — Encounter: Payer: Medicare HMO | Admitting: Occupational Therapy

## 2015-03-24 ENCOUNTER — Ambulatory Visit: Payer: PPO

## 2015-03-24 ENCOUNTER — Encounter: Payer: Medicare HMO | Admitting: Occupational Therapy

## 2015-03-24 DIAGNOSIS — IMO0002 Reserved for concepts with insufficient information to code with codable children: Secondary | ICD-10-CM

## 2015-03-24 DIAGNOSIS — I69320 Aphasia following cerebral infarction: Secondary | ICD-10-CM | POA: Diagnosis not present

## 2015-03-24 NOTE — Therapy (Signed)
[facility] [address] [place], [place], [address] Phone: [phone]   Fax:  [phone]  Speech Language Pathology Treatment  Patient Details  Name: Richard [name] MRN: [id number] Date of Birth: [birth year] Referring Provider: Claudette Laws, MD  Encounter Date: 03/24/2015      End of Session - 03/24/15 1324    Visit Number 10   Number of Visits 17   Date for SLP Re-Evaluation 03/27/15   SLP Start Time 0932   SLP Stop Time  1015   SLP Time Calculation (min) 43 min   Activity Tolerance Patient tolerated treatment well      Past Medical History  Diagnosis Date  . Diabetes mellitus without complication (HCC)   . Stroke [facility])     No past surgical history on file.  There were no vitals filed for this visit.  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 He reports coming to terms that he is not going to be able to use more advanced/compelx words as he did premoribidly. Pt req'd mod cues consistently for strucutred tasks for semantic relationships. In written tasks (words) pt req'd rare mod A  for spelling and rare min A for word generation.    Assessment / Recommendations / Plan   Plan --  Pt would like to decr to once a week   Progression Toward Goals   Progression toward goals Progressing toward goals            SLP  Short Term Goals - 03/05/15 0948    SLP SHORT TERM GOAL #1   Title pt will describe pictures in detail with 80% success using compensations   Status Achieved   SLP SHORT TERM GOAL #2   Title pt will write short lists, and other functional word level tasks, with extra time and rare min A   Time --   Period --   Status Achieved   SLP SHORT TERM GOAL #3   Title pt will tell SLP three compensations for expressive language    Status Achieved          SLP Long Term Goals - 03/24/15 1326    SLP LONG TERM GOAL #1   Title pt will functionally participate in 10 minutes min-mod copmlex conversation using compensations   Time 5  renewal completed the week of 03-26-15   Period Weeks   Status Revised   SLP LONG TERM GOAL #2   Title pt will engage in 8 minutes mod complex conversation with occasional min A   Time 5   Period Weeks   Status Revised   SLP LONG TERM GOAL #3   Title pt will write short notes with occasional min A   Time 5   Period Weeks   Status Revised          Plan -  03/24/15 1325    Clinical Impression Statement 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 Pt needed cont'd reminding about using compensations today. Written aphasia also noted today but corrected with extra time and rare mod A. Cont skilled ST needed to maximize pt's expressive language skills.   Speech Therapy Frequency 2x / week but pt requests once a week and scheduled as such   Duration 5 weeks   Treatment/Interventions SLP instruction and feedback;Compensatory strategies;Patient/family education;Functional tasks;Cueing hierarchy;Language facilitation   Potential to Achieve Goals Good   Potential Considerations Severity of impairments          G-Codes - 03/24/15 1328    Functional Assessment Tool Used noms- approx 25% impaired   Functional Limitations Spoken language expressive   Spoken Language Expression Current Status [phone]([id number]) At least 20 percent but less  than 40 percent impaired, limited or restricted   Spoken Language Expression Goal Status ([id number]) At least 1 percent but less than 20 percent impaired, limited or restricted     Speech Therapy Progress Note  Dates of Reporting Period: 01-26-15 to present  Objective: Pt has completed 10 ST visits focusing mainly on verbal expression in structured speech tasks and in conversation.   Objective (with Goal update): See above for goal update. He cont to have difficulty in conversation and cont to require SLP cues for use of compensations. Pt takes extra time to think of word instead of using compensations at times. (See "Clinical impressions statement" above)  Plan: Cont skilled ST for at least 5 more weeks to improve pt's expressive language skills. Pt has requested once per week beginning next week.  Reason Skilled Services are Required: Pt has not improved to a functional level with mod complex conversation, and does not consistently use compensatory strategies for verbal expression.  Problem List Patient Active Problem List   Diagnosis Date Noted  . Aphasia complicating stroke 02/09/2015  . 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 Type 2 diabetes mellitus with peripheral neuropathy (HCC)   . Essential hypertension   . Alcohol withdrawal (HCC)   . Cerebral hemorrhage (HCC)   . Endotracheally intubated   . ICH (intracerebral hemorrhage) (HCC) 12/10/2014    [facility][name] , MS, CCC-SLP  03/24/2015, 1:35 PM  [facility] [address] [place], [place], [address] Phone: [phone]   Fax:  [phone]   Name: Richard [name] MRN: [id number] Date of Birth: [birth year]

## 2015-03-26 ENCOUNTER — Encounter: Payer: Medicare HMO | Admitting: Occupational Therapy

## 2015-03-31 ENCOUNTER — Encounter: Payer: Medicare HMO | Admitting: Occupational Therapy

## 2015-04-01 ENCOUNTER — Ambulatory Visit: Payer: PPO | Attending: Physical Medicine & Rehabilitation

## 2015-04-01 DIAGNOSIS — I69320 Aphasia following cerebral infarction: Secondary | ICD-10-CM | POA: Insufficient documentation

## 2015-04-01 DIAGNOSIS — IMO0002 Reserved for concepts with insufficient information to code with codable children: Secondary | ICD-10-CM

## 2015-04-01 NOTE — Therapy (Signed)
[facility] [address] [place], [place], [address] Phone: [phone]   Fax:  [phone]  Speech Language Pathology Treatment  Patient Details  Name: Richard JakschLeon Guardiola MRN: [id number] Date of Birth: Jul 10, 1945 Referring Provider: Claudette LawsKirsteins, Andrew, MD  Encounter Date: 04/01/2015      End of Session - 04/01/15 0938    Visit Number 11   Number of Visits 18   Date for SLP Re-Evaluation 05/01/15   SLP Start Time 0935   SLP Stop Time  1015   SLP Time Calculation (min) 40 min   Activity Tolerance Patient tolerated treatment well      Past Medical History  Diagnosis Date  . Diabetes mellitus without complication (HCC)   . Stroke [facility](HCC)     No past surgical history on file.  There were no vitals filed for this visit.  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      Subjective Assessment - 04/01/15 0939    Subjective "Got all my homework done,"               ADULT SLP TREATMENT - 04/01/15 0939    General Information   Behavior/Cognition Alert;Cooperative;Pleasant mood   Treatment Provided   Treatment provided Cognitive-Linquistic   Pain Assessment   Pain Assessment 0-10   Pain Score 3    Pain Location rt shoulder and hand   Pain Descriptors / Indicators Aching   Pain Intervention(s) Monitored during session   Cognitive-Linquistic Treatment   Treatment focused on Aphasia   Skilled Treatment SLP assessed pt's ability with mod-max complex conversation with describing pain in detail and pt functional with rare hesitations/pausing. Detailed/mod complex picture descriptions were used to maximize pt's verbal expression skills. SLP min-mod cues necessary for semantic and phonemic errors (pt unaware approx 75% of the time) as well as anomia. SLP and pt agree that next visit could be patient's last if abilities remain essentially unchanged as they have since last visit.   Assessment / Recommendations / Plan   Plan  Continue with current plan of care   Progression Toward Goals   Progression toward goals --  pt cont motivated for change            SLP Short Term Goals - 03/05/15 0948    SLP SHORT TERM GOAL #1   Title pt will describe pictures in detail with 80% success using compensations   Status Achieved   SLP SHORT TERM GOAL #2   Title pt will write short lists, and other functional word level tasks, with extra time and rare min A   Time --   Period --   Status Achieved   SLP SHORT TERM GOAL #3   Title pt will tell SLP three compensations for expressive language    Status Achieved          SLP Long Term Goals - 04/01/15 1401    SLP LONG TERM GOAL #1   Title pt will functionally participate in 10 minutes min-mod copmlex conversation using compensations   Time 4  renewal completed the week of 03-26-15   Period Weeks   Status Revised   SLP LONG TERM GOAL #2   Title pt will engage in 8 minutes mod complex conversation with occasional min A   Time 4   Period Weeks   Status Revised   SLP LONG TERM GOAL #3   Title pt will write short notes with occasional min A   Time 4   Period Weeks  Status Revised          Plan - 04/01/15 1359    Clinical Impression Statement 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 In mod-max difficult tasks today pt req'd SLP A. Cont skilled ST needed to maximize pt's expressive language skills. Pt agrees with SLP that if progress remains static next session that pt will likely be d/c'd.   Speech Therapy Frequency 1x /week  but pt requests once a week   Duration 1 week   Treatment/Interventions SLP instruction and feedback;Compensatory strategies;Patient/family education;Functional tasks;Cueing hierarchy;Language facilitation   Potential to Achieve Goals Good   Potential Considerations Severity of impairments        Problem List Patient Active Problem List   Diagnosis Date Noted  . Aphasia complicating  stroke 02/09/2015  . 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 Type 2 diabetes mellitus with peripheral neuropathy (HCC)   . Essential hypertension   . Alcohol withdrawal (HCC)   . Cerebral hemorrhage (HCC)   . Endotracheally intubated   . ICH (intracerebral hemorrhage) (HCC) 12/10/2014    SCHINKE,CARL , MS, CCC-SLP  04/01/2015, 2:03 PM  [facility] [address] [place], [place], [address] Phone: [phone]   Fax:  [phone]   Name: Hadriel Northup MRN: [id number] Date of Birth: [birth year]

## 2015-04-06 ENCOUNTER — Encounter: Payer: Self-pay | Admitting: Interventional Cardiology

## 2015-04-08 ENCOUNTER — Ambulatory Visit: Payer: PPO

## 2015-04-13 ENCOUNTER — Ambulatory Visit: Payer: PPO

## 2015-04-13 DIAGNOSIS — IMO0002 Reserved for concepts with insufficient information to code with codable children: Secondary | ICD-10-CM

## 2015-04-13 DIAGNOSIS — I69320 Aphasia following cerebral infarction: Secondary | ICD-10-CM | POA: Diagnosis not present

## 2015-04-13 NOTE — Therapy (Signed)
[facility] [address] [place], [place], [address] Phone: [phone]   Fax:  [phone]  Speech Language Pathology Treatment  Patient Details  Name: Richard [name] MRN: [id number] Date of Birth: 1946-01-18 Referring Provider: Alysia Penna, MD  Encounter Date: 04/13/2015    Past Medical History  Diagnosis Date  . Diabetes mellitus without complication ([place])   . Stroke [facility])     No past surgical history on file.  There were no vitals filed for this visit.  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      Subjective Assessment - 04/13/15 1025    Subjective "I did the exercises you gave me."               ADULT SLP TREATMENT - 04/13/15 1025    General Information   Behavior/Cognition Alert;Cooperative;Pleasant mood   Treatment Provided   Treatment provided Cognitive-Linquistic   Pain Assessment   Pain Assessment 0-10   Pain Score 2    Pain Location rt shoulder and hand   Pain Descriptors / Indicators Aching;Sore   Pain Intervention(s) Monitored during session   Cognitive-Linquistic Treatment   Treatment focused on Aphasia   Skilled Treatment In mod complex language tasks today (similarities/differences), min-mod cues req'd usually from SLP for semantic and phonemic errors,which pt was aware of approx 50% of the time. Pt using compensations of reduced rate and simplifying language to improve speech fluidity. In mod complex conversation pt req'd occasional min to mod A from SLP for message conveyance.   Assessment / Recommendations / Plan   Plan --  pt return for screen in ~6 wks to touch base, then d/c   Progression Toward Goals   Progression toward goals Not progressing toward goals (comment)  see goal update            SLP Short Term Goals - 03/05/15 0948    SLP SHORT TERM GOAL #1   Title pt will describe pictures in detail with 80% success using compensations   Status Achieved   SLP  SHORT TERM GOAL #2   Title pt will write short lists, and other functional word level tasks, with extra time and rare min A   Time --   Period --   Status Achieved   SLP SHORT TERM GOAL #3   Title pt will tell SLP three compensations for expressive language    Status Achieved          SLP Long Term Goals - 04/13/15 [place] #1   Title pt will functionally participate in 10 minutes min-mod copmlex conversation using compensations   Time 4    Period Weeks   Status On-going  with revisions and repetitions, partially met on 04-13-15   SLP LONG TERM GOAL #2   Title pt will engage in 8 minutes mod complex conversation with occasional min A   Time 4   Period Weeks   Status On-going  not met on 04-13-15   SLP LONG TERM GOAL #3   Title pt will write short notes with occasional min A   Time 4   Period Weeks   Status Achieved          Plan - 04/13/15 1422    Clinical Impression Statement 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  SLP A with aphasic language and sentence construction still req'd with min-mod and mod complex conversation. Cont skilled ST x1 more visit (a  screening), pt requested, to "touch base" to maximize pt's expressive language skills by problem solving difficult situations.   Speech Therapy Frequency 1x /week   Duration 1 week   Treatment/Interventions SLP instruction and feedback;Compensatory strategies;Patient/family education;Functional tasks;Cueing hierarchy;Language facilitation   Potential to Achieve Goals Good   Potential Considerations Severity of impairments        Problem List Patient Active Problem List   Diagnosis Date Noted  . Aphasia complicating stroke 70/48/8891  . 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 Type 2 diabetes mellitus with peripheral neuropathy (HCC)   . Essential hypertension   . Alcohol withdrawal  ([place])   . Cerebral hemorrhage ([place])   . Endotracheally intubated   . ICH (intracerebral hemorrhage) ([place]) 12/10/2014    [facility] , [place], [place]  04/13/2015, 2:24 PM  [place] [address] [place], [place], [address] Phone: [phone]   Fax:  [phone]   Name: Nicholes Hibler MRN: [id number] Date of Birth: 06/01/1945

## 2015-04-28 ENCOUNTER — Encounter: Payer: Self-pay | Admitting: Occupational Therapy

## 2015-04-28 NOTE — Therapy (Signed)
[facility] [address] [place], [place], [address] Phone: [phone]   Fax:  [phone]  Patient Details  Name: Richard [name] MRN: [id number] Date of Birth: Jan 20, 1946 Referring Provider:  No ref. provider found  Encounter Date: 04/28/2015  OCCUPATIONAL THERAPY DISCHARGE SUMMARY  Visits from Start of Care: 7  Current functional level related to goals / functional outcomes:      OT Short Term Goals - 03/09/15 0854    OT SHORT TERM GOAL #1   Title Pt will be independent with initial HEP.--check STGs 02/21/16   Time 4   Period Weeks   Status Achieved   OT SHORT TERM GOAL #2   Title Pt will demo improved RUE coordination as shown by improving score on box and blocks test by at least 8.   Baseline R-28 blocks, L-41 blocks   Time 4   Period Weeks   Status Achieved  02/23/15:  33blocks; 03/09/15 39 blocks   OT SHORT TERM GOAL #3   Title Pt will demo improved R grip strength by at least 8lbs to assist in lifting/gripping for ADLs.   Baseline R-56lbs, L-77lbs   Time 4   Period Weeks   Status Achieved  02/23/15:  79, 86lbs   OT SHORT TERM GOAL #4   Title Pt will eat with RUE at least 50% of the time.   Baseline approx 20%   Time 4   Period Weeks   Status Achieved  02/11/15:  using RUE for all eating except cutting meat   OT SHORT TERM GOAL #5   Title Pt will write name with at least 75% legibility.   Baseline 20%   Time 4   Period Weeks   Status Achieved  02/03/15  100% printing          OT Long Term Goals - 03/09/15 0853    OT LONG TERM GOAL #1   Title Pt will be independent with updated HEP.--check LTGs 03/23/15   Time 8   Period Weeks   Status Achieved   OT LONG TERM GOAL #2   Title Pt will improve RUE coordination for ADLs as shown by completing 9-hole peg test in 90sec or less.   Baseline only placed 5 pegs in within 2 min   Time 8   Period Weeks   Status Achieved  03/09/15  76.03sec   OT  LONG TERM GOAL #3   Title Pt will perform simple cooking/cleaning task mod I.   Baseline not performing   Time 8   Period Weeks   Status Partially met.   OT LONG TERM GOAL #4   Title Pt will use dominant RUE for brushing teeth/eating at least 90% of the time.   Time 8   Period Weeks   Status Achieved  02/11/15   OT LONG TERM GOAL #5   Title Pt will be able to complete clothing fasteners and cut food using AE prn.   Time 8   Period Weeks   Status Achieved  03/09/15   OT LONG TERM GOAL #6   Title Pt will be able to write simple sentence and name with 90% legibility.   Time 8   Period Weeks   Status Achieved  02/03/15 (printing)        Remaining deficits: decr coordination, decr sensation affecting RUE functional use   Education / Equipment: HEP, CVA education, cooking considerations.  Pt verbalized understanding of education provided.  Plan: Patient agrees to discharge.  Patient goals were partially met. Patient is being discharged due to being pleased with the current functional level.  All STGs met and 5/6 LTGs met (remaining partially met).  Pt was to continue with HEP at home and schedule within 30 days of last visit if he felt he needed updates to HEP or recommendations for remaining partially met goal.  However, as pt has not returned within 30 days, he will be d/c at this time. ?????          [facility] 04/28/2015, 9:02 AM  [facility] [address] [place], [place], [address] Phone: [phone]   Fax:  [place], OTR/L 04/28/2015 9:07 AM

## 2015-05-06 DIAGNOSIS — Z6829 Body mass index (BMI) 29.0-29.9, adult: Secondary | ICD-10-CM | POA: Diagnosis not present

## 2015-05-06 DIAGNOSIS — E109 Type 1 diabetes mellitus without complications: Secondary | ICD-10-CM | POA: Diagnosis not present

## 2015-05-06 DIAGNOSIS — I10 Essential (primary) hypertension: Secondary | ICD-10-CM | POA: Diagnosis not present

## 2015-05-12 ENCOUNTER — Telehealth: Payer: Self-pay | Admitting: Neurology

## 2015-05-12 ENCOUNTER — Encounter: Payer: Self-pay | Admitting: Neurology

## 2015-05-12 ENCOUNTER — Ambulatory Visit (INDEPENDENT_AMBULATORY_CARE_PROVIDER_SITE_OTHER): Payer: PPO | Admitting: Neurology

## 2015-05-12 VITALS — BP 141/87 | HR 82 | Ht 71.0 in | Wt 215.4 lb

## 2015-05-12 DIAGNOSIS — I611 Nontraumatic intracerebral hemorrhage in hemisphere, cortical: Secondary | ICD-10-CM

## 2015-05-12 NOTE — Patient Instructions (Signed)
I had a long d/w patient about his recent stroke, risk for recurrent stroke/TIAs, personally independently reviewed imaging studies and stroke evaluation results and answered questions.Start Aspirin 81 mg daily  for secondary stroke prevention and maintain strict control of hypertension with blood pressure goal below 130/90, diabetes with hemoglobin A1c goal below 6.5% and lipids with LDL cholesterol goal below 70 mg/dL. I also advised the patient to eat a healthy diet with plenty of whole grains, cereals, fruits and vegetables, exercise regularly and maintain ideal body weight .Check follow-up MRI scan of the brain with and without contrast to look for any underlying vascular abnormality as the cause of his intracerebral hemorrhage. Continue ongoing speech therapy. Followup in the future with Butch Penny, nurse practitioner in 3 months or call earlier if necessary.  Stroke Prevention Some medical conditions and behaviors are associated with an increased chance of having a stroke. You may prevent a stroke by making healthy choices and managing medical conditions. HOW CAN I REDUCE MY RISK OF HAVING A STROKE?   Stay physically active. Get at least 30 minutes of activity on most or all days.  Do not smoke. It may also be helpful to avoid exposure to secondhand smoke.  Limit alcohol use. Moderate alcohol use is considered to be:  No more than 2 drinks per day for men.  No more than 1 drink per day for nonpregnant women.  Eat healthy foods. This involves:  Eating 5 or more servings of fruits and vegetables a day.  Making dietary changes that address high blood pressure (hypertension), high cholesterol, diabetes, or obesity.  Manage your cholesterol levels.  Making food choices that are high in fiber and low in saturated fat, trans fat, and cholesterol may control cholesterol levels.  Take any prescribed medicines to control cholesterol as directed by your health care provider.  Manage your  diabetes.  Controlling your carbohydrate and sugar intake is recommended to manage diabetes.  Take any prescribed medicines to control diabetes as directed by your health care provider.  Control your hypertension.  Making food choices that are low in salt (sodium), saturated fat, trans fat, and cholesterol is recommended to manage hypertension.  Ask your health care provider if you need treatment to lower your blood pressure. Take any prescribed medicines to control hypertension as directed by your health care provider.  If you are 11-14 years of age, have your blood pressure checked every 3-5 years. If you are 28 years of age or older, have your blood pressure checked every year.  Maintain a healthy weight.  Reducing calorie intake and making food choices that are low in sodium, saturated fat, trans fat, and cholesterol are recommended to manage weight.  Stop drug abuse.  Avoid taking birth control pills.  Talk to your health care provider about the risks of taking birth control pills if you are over 11 years old, smoke, get migraines, or have ever had a blood clot.  Get evaluated for sleep disorders (sleep apnea).  Talk to your health care provider about getting a sleep evaluation if you snore a lot or have excessive sleepiness.  Take medicines only as directed by your health care provider.  For some people, aspirin or blood thinners (anticoagulants) are helpful in reducing the risk of forming abnormal blood clots that can lead to stroke. If you have the irregular heart rhythm of atrial fibrillation, you should be on a blood thinner unless there is a good reason you cannot take them.  Understand all your  medicine instructions.  Make sure that other conditions (such as anemia or atherosclerosis) are addressed. SEEK IMMEDIATE MEDICAL CARE IF:   You have sudden weakness or numbness of the face, arm, or leg, especially on one side of the body.  Your face or eyelid droops to one  side.  You have sudden confusion.  You have trouble speaking (aphasia) or understanding.  You have sudden trouble seeing in one or both eyes.  You have sudden trouble walking.  You have dizziness.  You have a loss of balance or coordination.  You have a sudden, severe headache with no known cause.  You have new chest pain or an irregular heartbeat. Any of these symptoms may represent a serious problem that is an emergency. Do not wait to see if the symptoms will go away. Get medical help at once. Call your local emergency services (911 in [place] Do not drive yourself to the hospital.   This information is not intended to replace advice given to you by your health care provider. Make sure you discuss any questions you have with your health care provider.   Document Released: 04/21/2004 Document Revised: 04/04/2014 Document Reviewed: 09/14/2012 Elsevier Interactive Patient Education [employer].

## 2015-05-12 NOTE — Telephone Encounter (Signed)
Patient called, was seen today and was to call back with information, patient advises he is on Humalin N 40 units in the morning and 35 units at night and Humalin R on sliding scale as needed.

## 2015-05-12 NOTE — Progress Notes (Signed)
[facility] [address] [place]. [place] [address] [phone]       OFFICE FOLLOW-UP NOTE  [name] Date of Birth:  January 19, 1946 Medical Record Number:  [id number]   HPI: 46 year African-American male seen today for the first office follow-up visit following intracerebral hemorrhage in September 2016 Richard [name] is a 70 y.o. male, right handed, with a past medical history significant for DM, stroke without residual deficits, s/p artificial left eye, brought in for evaluation of the aforementioned symptoms/signs.  Patient `s son  at the bedside indicated that his father was last seen normal around 11 pm  Night prior to admission, but when he woke up  Next morning he was not able to speak fluently and he noticed weakness of the right side of his face, and thus he became concerned and brought patient to the ED. Upon arrival to the ED, patient was described as awake and alert but aphasic with right face weakness and SBP>200. Urgent CT brain was obtained, personally reviewed, and showed evidence of a lobar left frontal ICH measuring 3.5 x 2.5 cm transverse, with an estimated ICH volume of approximately of 17.5 ml. There was mild surrounding vasogenic edema but no IV extension or hydrocephalus.   He was started on nicardipine drip as his BP remains >190. Serologies reviewed: PTT 27, INR 1.02, platelets 238. He was admitted to the intensive care unit with white blood pressure control, close neurological monitoring and placed on alcohol withdrawal precautions. Follow-up CT scan showed stable appearance of the hematoma without any extension normal expansion. Carotid Doppler showed no significant expectoration stenosis. Transthoracic echo showed normal ejection fraction. LDL cholesterol was 102 mg percent and hemoglobin A1c was 7.5. Patient did well and was transferred to the floor and subsequently to inpatient rehabilitation. He has done well and states his made near complete  neurological recovery. He still has some diminished fine motor skills in his right hand as well as his right leg tires easily. He also has some occasional word finding difficulties particularly when he gets excited and tries to talk fast. He has finished inpatient rehabilitation as well as home therapies and is currently participating in outpatient speech therapy. He recently finished outpatient physical and occupational therapies. He states his blood pressure is well controlled and today it is 141/87. He has no new complaints today. ROS:   14 system review of systems is positive for  joint pain , word finding difficulty, speech difficulty only and all other systems negative PMH:  Past Medical History  Diagnosis Date  . Diabetes mellitus without complication (HCC)   . Stroke (HCC)   . 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 Marital Status: Married    Spouse Name: N/A  . Number of Children: N/A  . Years of Education: N/A   Occupational History  . Not on file.   Social History Main Topics  . Smoking status: Never Smoker   . Smokeless tobacco: Not on file  . Alcohol Use: 0.6 oz/week    1 Cans of beer per week     Comment: occasional   . Drug Use: Not on file  . Sexual Activity: Not on file   Other Topics Concern  . Not on file   Social History Narrative    Medications:   Current Outpatient Prescriptions on File Prior to Visit  Medication Sig Dispense Refill  .  amLODipine (NORVASC) 10 MG tablet Take 1 tablet (10 mg total) by mouth daily. 30 tablet 1  . Multiple Vitamin (MULTIVITAMIN WITH MINERALS) TABS tablet Take 1 tablet by mouth daily.    Marland Kitchen olmesartan-hydrochlorothiazide (BENICAR HCT) 40-25 MG tablet Take 1 tablet by mouth daily. 30 tablet 1  . vitamin B-12 100 MCG tablet Take 1 tablet (100 mcg total) by mouth daily. 30 tablet 1   No current facility-administered  medications on file prior to visit.    Allergies:  No Known Allergies  Physical Exam General: well developed, well nourished elderly African-American male, seated, in no evident distress Head: head normocephalic and atraumatic.  Neck: supple with no carotid or supraclavicular bruits Cardiovascular: regular rate and rhythm, no murmurs Musculoskeletal: no deformity Skin:  no rash/petichiae Vascular:  Normal pulses all extremities Filed Vitals:   05/12/15 0900  BP: 141/87  Pulse: 82   Neurologic Exam Mental Status: Awake and fully alert. Oriented to place and time. Recent and remote memory intact. Attention span, concentration and fund of knowledge appropriate. Mood and affect appropriate.  Cranial Nerves: Fundoscopic exam reveals sharp disc margin in the right eye. Left eye is artificial.. Pupils  small, briskly reactive to light on right and cannot test on the left.. Extraocular movements full without nystagmus in the right eye and left eye is artificial. Left eye mechanical ptosis. Visual fields full to confrontation in the right eye. Hearing intact. Facial sensation intact. Face, tongue, palate moves normally and symmetrically.  Motor: Normal bulk and tone. Normal strength in all tested extremity muscles. Sensory.: intact to touch ,pinprick .position and vibratory sensation.  Coordination: Rapid alternating movements normal in all extremities. Finger-to-nose and heel-to-shin performed accurately bilaterally. Gait and Station: Arises from chair without difficulty. Stance is normal. Gait demonstrates normal stride length and balance . Able to heel, toe and tandem walk with only slight  difficulty.  Reflexes: 1+ and symmetric. Toes downgoing.   NIHSS  0 Modified Rankin 1  ASSESSMENT: 70 year old African-American male with left frontal parenchymal hemorrhage in September 2016 etiology unclear hemorrhagic infarct versus primary hemorrhage due to hypertension. Patient is doing  neurologically quite well.    PLAN: I had a long d/w patient about his recent stroke, risk for recurrent stroke/TIAs, personally independently reviewed imaging studies and stroke evaluation results and answered questions.Start Aspirin 81 mg daily  for secondary stroke prevention and maintain strict control of hypertension with blood pressure goal below 130/90, diabetes with hemoglobin A1c goal below 6.5% and lipids with LDL cholesterol goal below 70 mg/dL. I also advised the patient to eat a healthy diet with plenty of whole grains, cereals, fruits and vegetables, exercise regularly and maintain ideal body weight .Check follow-up MRI scan of the brain with and without contrast to look for any underlying vascular abnormality as the cause of his intracerebral hemorrhage. Continue ongoing speech therapyGreater than 50% of time during this 25 minute visit was spent on counseling,explanation of diagnosis, planning of further management, discussion with patient and family and coordination of care . Followup in the future with Butch Penny, nurse practitioner in 3 months or call earlier if necessary. Delia Heady, MD  Note: This document was prepared with digital dictation and possible smart phrase technology. Any transcriptional errors that result from this process are unintentional

## 2015-05-13 NOTE — Telephone Encounter (Signed)
RN call patients pharmacy which is [facility] on [address]. The pharmacist stated patient is taking novolin n and novolin r. Patient med list update.

## 2015-05-21 ENCOUNTER — Telehealth: Payer: Self-pay | Admitting: *Deleted

## 2015-05-21 ENCOUNTER — Ambulatory Visit: Payer: PPO | Attending: Physical Medicine & Rehabilitation

## 2015-05-21 DIAGNOSIS — I69320 Aphasia following cerebral infarction: Secondary | ICD-10-CM | POA: Insufficient documentation

## 2015-05-21 DIAGNOSIS — IMO0002 Reserved for concepts with insufficient information to code with codable children: Secondary | ICD-10-CM

## 2015-05-21 NOTE — Telephone Encounter (Signed)
Rn ask if she have MRI done earlier than May 2017. Rn stated he has an appt in May with the NP. Rn stated MD will be office on Monday and he will call back to see if it needs to be schedule sooner.

## 2015-05-21 NOTE — Therapy (Signed)
[facility] [address] [place], [place], [address] Phone: [phone]   Fax:  [phone]  Patient Details  Name: Richard [name] MRN: [id number] Date of Birth: [birth year] Referring Provider:  Claudette Laws, MD  Encounter Date: 05/21/2015  S: Pt arrives today for speech screen, at his request upon d/c from therapy in early 2017.  O: Pt's speech remains much like it was at time of discharge. 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 Pt reports more difficulty with speech fluidity when thinking listener is in a hurry. SLP and pt talked through the need of a psychological solution to this problem and pt named solution of stopping verbalization, breathing deeply x1-2, and self-talk that he needs to focus on relaxed and slow speech instead of the need to communicate a certain way.  A. Pt needs to cont with speech compensations. He told SLP that he knew he was putting pressure on himself to communicate quickly and needs to take that pressure off of himself.   P: Pt will cont to use compensations for speech fluidity. He should continue to improve over time. SLP told pt a "tune-up" ST evaluation and therapy may be desired by pt in approx 6 months. A prescription will be necessary at that time.   [facility] ,MS, CCC-SLP  05/21/2015, 3:36 PM  [facility] [address] [place], [place], [address] Phone: [phone]   Fax:  [phone]

## 2015-05-25 NOTE — Telephone Encounter (Signed)
I called patient to both numbers on file and left a message on both numbers letting the patient know to call [facility] [phone] to reschedule his MRI to a sooner date. Thanks!

## 2015-05-25 NOTE — Telephone Encounter (Signed)
Yes schedule the MRI prior to patient's follow-up visit with me

## 2015-06-01 ENCOUNTER — Inpatient Hospital Stay: Admission: RE | Admit: 2015-06-01 | Payer: PPO | Source: Ambulatory Visit

## 2015-06-08 ENCOUNTER — Ambulatory Visit
Admission: RE | Admit: 2015-06-08 | Discharge: 2015-06-08 | Disposition: A | Discharge status: Home / Self Care | Payer: PPO | Source: Ambulatory Visit | Attending: Neurology | Admitting: Neurology

## 2015-06-08 DIAGNOSIS — I611 Nontraumatic intracerebral hemorrhage in hemisphere, cortical: Secondary | ICD-10-CM | POA: Diagnosis not present

## 2015-06-08 MED ORDER — GADOBENATE DIMEGLUMINE 529 MG/ML IV SOLN: 19.0000 mL | Freq: Once | INTRAVENOUS | Status: DC | PRN

## 2015-06-12 ENCOUNTER — Encounter (HOSPITAL_COMMUNITY): Payer: Self-pay | Admitting: Nurse Practitioner

## 2015-06-12 ENCOUNTER — Inpatient Hospital Stay (HOSPITAL_COMMUNITY)
Admission: EM | Admit: 2015-06-12 | Discharge: 2015-06-14 | DRG: 246 | Disposition: A | Discharge status: Home / Self Care | Payer: PPO | Source: Intra-hospital | Attending: Interventional Cardiology | Admitting: Interventional Cardiology

## 2015-06-12 ENCOUNTER — Ambulatory Visit (HOSPITAL_COMMUNITY): Admit: 2015-06-12 | Payer: Self-pay | Admitting: Interventional Cardiology

## 2015-06-12 ENCOUNTER — Encounter (HOSPITAL_COMMUNITY)
Admission: EM | Disposition: A | Discharge status: Home / Self Care | Payer: Self-pay | Attending: Interventional Cardiology

## 2015-06-12 DIAGNOSIS — I462 Cardiac arrest due to underlying cardiac condition: Secondary | ICD-10-CM | POA: Diagnosis not present

## 2015-06-12 DIAGNOSIS — N289 Disorder of kidney and ureter, unspecified: Secondary | ICD-10-CM | POA: Diagnosis not present

## 2015-06-12 DIAGNOSIS — E785 Hyperlipidemia, unspecified: Secondary | ICD-10-CM

## 2015-06-12 DIAGNOSIS — E1142 Type 2 diabetes mellitus with diabetic polyneuropathy: Secondary | ICD-10-CM | POA: Diagnosis not present

## 2015-06-12 DIAGNOSIS — I255 Ischemic cardiomyopathy: Secondary | ICD-10-CM | POA: Diagnosis not present

## 2015-06-12 DIAGNOSIS — Z823 Family history of stroke: Secondary | ICD-10-CM

## 2015-06-12 DIAGNOSIS — I2121 ST elevation (STEMI) myocardial infarction involving left circumflex coronary artery: Secondary | ICD-10-CM | POA: Insufficient documentation

## 2015-06-12 DIAGNOSIS — I252 Old myocardial infarction: Secondary | ICD-10-CM | POA: Diagnosis present

## 2015-06-12 DIAGNOSIS — I119 Hypertensive heart disease without heart failure: Secondary | ICD-10-CM

## 2015-06-12 DIAGNOSIS — I4901 Ventricular fibrillation: Secondary | ICD-10-CM | POA: Diagnosis not present

## 2015-06-12 DIAGNOSIS — I2119 ST elevation (STEMI) myocardial infarction involving other coronary artery of inferior wall: Secondary | ICD-10-CM | POA: Diagnosis not present

## 2015-06-12 DIAGNOSIS — I619 Nontraumatic intracerebral hemorrhage, unspecified: Secondary | ICD-10-CM | POA: Diagnosis present

## 2015-06-12 DIAGNOSIS — Z955 Presence of coronary angioplasty implant and graft: Secondary | ICD-10-CM

## 2015-06-12 DIAGNOSIS — F1010 Alcohol abuse, uncomplicated: Secondary | ICD-10-CM | POA: Diagnosis present

## 2015-06-12 DIAGNOSIS — I10 Essential (primary) hypertension: Secondary | ICD-10-CM

## 2015-06-12 DIAGNOSIS — Z8679 Personal history of other diseases of the circulatory system: Secondary | ICD-10-CM

## 2015-06-12 DIAGNOSIS — I213 ST elevation (STEMI) myocardial infarction of unspecified site: Secondary | ICD-10-CM

## 2015-06-12 DIAGNOSIS — R0789 Other chest pain: Secondary | ICD-10-CM | POA: Diagnosis not present

## 2015-06-12 DIAGNOSIS — I2510 Atherosclerotic heart disease of native coronary artery without angina pectoris: Secondary | ICD-10-CM | POA: Diagnosis not present

## 2015-06-12 DIAGNOSIS — Z7982 Long term (current) use of aspirin: Secondary | ICD-10-CM | POA: Diagnosis not present

## 2015-06-12 DIAGNOSIS — Z8673 Personal history of transient ischemic attack (TIA), and cerebral infarction without residual deficits: Secondary | ICD-10-CM | POA: Diagnosis not present

## 2015-06-12 DIAGNOSIS — E1159 Type 2 diabetes mellitus with other circulatory complications: Secondary | ICD-10-CM

## 2015-06-12 HISTORY — PX: LEFT HEART CATH AND CORONARY ANGIOGRAPHY: CATH118249

## 2015-06-12 HISTORY — DX: Hypertensive heart disease without heart failure: I11.9

## 2015-06-12 HISTORY — DX: Atherosclerotic heart disease of native coronary artery without angina pectoris: I25.10

## 2015-06-12 HISTORY — DX: Personal history of other diseases of the circulatory system: Z86.79

## 2015-06-12 HISTORY — DX: ST elevation (STEMI) myocardial infarction of unspecified site: I21.3

## 2015-06-12 HISTORY — DX: Nontraumatic intracranial hemorrhage, unspecified: I62.9

## 2015-06-12 HISTORY — DX: Other ill-defined heart diseases: I51.89

## 2015-06-12 HISTORY — DX: Alcohol abuse, uncomplicated: F10.10

## 2015-06-12 LAB — COMPREHENSIVE METABOLIC PANEL
ALBUMIN: 3.8 g/dL (ref 3.5–5.0)
ALT: 12 U/L — ABNORMAL LOW (ref 17–63)
AST: 23 U/L (ref 15–41)
Alkaline Phosphatase: 74 U/L (ref 38–126)
Anion gap: 13 (ref 5–15)
BILIRUBIN TOTAL: 0.8 mg/dL (ref 0.3–1.2)
BUN: 14 mg/dL (ref 6–20)
CHLORIDE: 97 mmol/L — AB (ref 101–111)
CO2: 29 mmol/L (ref 22–32)
Calcium: 9.3 mg/dL (ref 8.9–10.3)
Creatinine, Ser: 1.12 mg/dL (ref 0.61–1.24)
GFR calc Af Amer: >60 mL/min (ref >60)
GFR calc non Af Amer: >60 mL/min (ref >60)
GLUCOSE: 195 mg/dL — AB (ref 65–99)
POTASSIUM: 3.6 mmol/L (ref 3.5–5.1)
Sodium: 139 mmol/L (ref 135–145)
TOTAL PROTEIN: 7.1 g/dL (ref 6.5–8.1)

## 2015-06-12 LAB — POCT I-STAT, CHEM 8
BUN: 20 mg/dL (ref 6–20)
CHLORIDE: 100 mmol/L — AB (ref 101–111)
Calcium, Ion: 1.11 mmol/L — ABNORMAL LOW (ref 1.13–1.30)
Creatinine, Ser: 1 mg/dL (ref 0.61–1.24)
GLUCOSE: 157 mg/dL — AB (ref 65–99)
HCT: 43 % (ref 39.0–52.0)
Hemoglobin: 14.6 g/dL (ref 13.0–17.0)
POTASSIUM: 3.4 mmol/L — AB (ref 3.5–5.1)
Sodium: 140 mmol/L (ref 135–145)
TCO2: 29 mmol/L (ref 0–100)

## 2015-06-12 LAB — CBC WITH DIFFERENTIAL/PLATELET
Basophils Absolute: 0 10*3/uL (ref 0.0–0.1)
Basophils Relative: 0 %
EOS PCT: 1 %
Eosinophils Absolute: 0.1 10*3/uL (ref 0.0–0.7)
HCT: 43 % (ref 39.0–52.0)
Hemoglobin: 14.2 g/dL (ref 13.0–17.0)
LYMPHS ABS: 1.1 10*3/uL (ref 0.7–4.0)
LYMPHS PCT: 8 %
MCH: 28.8 pg (ref 26.0–34.0)
MCHC: 33 g/dL (ref 30.0–36.0)
MCV: 87.2 fL (ref 78.0–100.0)
MONO ABS: 0.6 10*3/uL (ref 0.1–1.0)
MONOS PCT: 5 %
Neutro Abs: 12 10*3/uL — ABNORMAL HIGH (ref 1.7–7.7)
Neutrophils Relative %: 86 %
PLATELETS: 249 10*3/uL (ref 150–400)
RBC: 4.93 MIL/uL (ref 4.22–5.81)
RDW: 12.3 % (ref 11.5–15.5)
WBC: 13.8 10*3/uL — ABNORMAL HIGH (ref 4.0–10.5)

## 2015-06-12 LAB — POCT I-STAT TROPONIN I: TROPONIN I, POC: 0.02 ng/mL (ref 0.00–0.08)

## 2015-06-12 LAB — PROTIME-INR
INR: 1.24 (ref 0.00–1.49)
PROTHROMBIN TIME: 15.8 s — AB (ref 11.6–15.2)

## 2015-06-12 LAB — LIPID PANEL
Cholesterol: 288 mg/dL — ABNORMAL HIGH (ref 0–200)
HDL: 42 mg/dL (ref >40)
LDL CALC: 236 mg/dL — AB (ref 0–99)
Total CHOL/HDL Ratio: 6.9 RATIO
Triglycerides: 52 mg/dL (ref <150)
VLDL: 10 mg/dL (ref 0–40)

## 2015-06-12 LAB — GLUCOSE, CAPILLARY
GLUCOSE-CAPILLARY: 257 mg/dL — AB (ref 65–99)
Glucose-Capillary: 155 mg/dL — ABNORMAL HIGH (ref 65–99)
Glucose-Capillary: 251 mg/dL — ABNORMAL HIGH (ref 65–99)

## 2015-06-12 LAB — TROPONIN I
TROPONIN I: 4.47 ng/mL — AB (ref <0.031)
Troponin I: 0.71 ng/mL (ref <0.031)

## 2015-06-12 LAB — POCT ACTIVATED CLOTTING TIME: Activated Clotting Time: 286 seconds

## 2015-06-12 LAB — BRAIN NATRIURETIC PEPTIDE: B Natriuretic Peptide: 17.6 pg/mL (ref 0.0–100.0)

## 2015-06-12 LAB — MRSA PCR SCREENING: MRSA by PCR: NEGATIVE

## 2015-06-12 LAB — APTT: aPTT: >200 seconds (ref 24–37)

## 2015-06-12 SURGERY — LEFT HEART CATH AND CORONARY ANGIOGRAPHY
Anesthesia: LOCAL

## 2015-06-12 MED ORDER — METOPROLOL TARTRATE 12.5 MG HALF TABLET
25.0000 mg | ORAL_TABLET | Freq: Two times a day (BID) | ORAL | Status: DC
  Administered 2015-06-12 (×2): 25 mg via ORAL
  Filled 2015-06-12 (×2): qty 2

## 2015-06-12 MED ORDER — CANGRELOR TETRASODIUM 50 MG IV SOLR
INTRAVENOUS | Status: AC
  Filled 2015-06-12: qty 50

## 2015-06-12 MED ORDER — ONDANSETRON HCL 4 MG/2ML IJ SOLN: 4.0000 mg | Freq: Four times a day (QID) | INTRAMUSCULAR | Status: DC | PRN

## 2015-06-12 MED ORDER — CETYLPYRIDINIUM CHLORIDE 0.05 % MT LIQD
7.0000 mL | Freq: Two times a day (BID) | OROMUCOSAL | Status: DC
  Administered 2015-06-12 – 2015-06-14 (×3): 7 mL via OROMUCOSAL

## 2015-06-12 MED ORDER — LIDOCAINE HCL (PF) 1 % IJ SOLN
INTRAMUSCULAR | Status: AC
  Filled 2015-06-12: qty 30

## 2015-06-12 MED ORDER — SODIUM CHLORIDE 0.9 % IV SOLN: 250.0000 mL | INTRAVENOUS | Status: DC | PRN

## 2015-06-12 MED ORDER — SODIUM CHLORIDE 0.9% FLUSH
3.0000 mL | Freq: Two times a day (BID) | INTRAVENOUS | Status: DC
  Administered 2015-06-12 – 2015-06-14 (×4): 3 mL via INTRAVENOUS

## 2015-06-12 MED ORDER — INSULIN ASPART 100 UNIT/ML ~~LOC~~ SOLN
0.0000 [IU] | Freq: Three times a day (TID) | SUBCUTANEOUS | Status: DC
  Administered 2015-06-13 – 2015-06-14 (×3): 3 [IU] via SUBCUTANEOUS

## 2015-06-12 MED ORDER — NITROGLYCERIN 1 MG/10 ML FOR IR/CATH LAB
INTRA_ARTERIAL | Status: AC
  Filled 2015-06-12: qty 10

## 2015-06-12 MED ORDER — CANGRELOR BOLUS VIA INFUSION
INTRAVENOUS | Status: DC | PRN
  Administered 2015-06-12: 2931 ug via INTRAVENOUS

## 2015-06-12 MED ORDER — INSULIN NPH (HUMAN) (ISOPHANE) 100 UNIT/ML ~~LOC~~ SUSP
35.0000 [IU] | Freq: Every day | SUBCUTANEOUS | Status: DC
  Administered 2015-06-12 – 2015-06-13 (×2): 35 [IU] via SUBCUTANEOUS
  Filled 2015-06-12 (×2): qty 10

## 2015-06-12 MED ORDER — AMLODIPINE BESYLATE 10 MG PO TABS
10.0000 mg | ORAL_TABLET | Freq: Every day | ORAL | Status: DC
  Administered 2015-06-13 – 2015-06-14 (×2): 10 mg via ORAL
  Filled 2015-06-12 (×2): qty 2
  Filled 2015-06-12: qty 1
  Filled 2015-06-12: qty 2

## 2015-06-12 MED ORDER — HEPARIN (PORCINE) IN NACL 2-0.9 UNIT/ML-% IJ SOLN
INTRAMUSCULAR | Status: AC
  Filled 2015-06-12: qty 500

## 2015-06-12 MED ORDER — CLOPIDOGREL BISULFATE 300 MG PO TABS
ORAL_TABLET | ORAL | Status: AC
  Filled 2015-06-12: qty 1

## 2015-06-12 MED ORDER — INSULIN NPH (HUMAN) (ISOPHANE) 100 UNIT/ML ~~LOC~~ SUSP
40.0000 [IU] | Freq: Every day | SUBCUTANEOUS | Status: DC
  Administered 2015-06-13 – 2015-06-14 (×2): 40 [IU] via SUBCUTANEOUS
  Filled 2015-06-12: qty 10

## 2015-06-12 MED ORDER — FUROSEMIDE 10 MG/ML IJ SOLN
INTRAMUSCULAR | Status: AC
  Filled 2015-06-12: qty 4

## 2015-06-12 MED ORDER — SODIUM CHLORIDE 0.9% FLUSH: 3.0000 mL | INTRAVENOUS | Status: DC | PRN

## 2015-06-12 MED ORDER — CLOPIDOGREL BISULFATE 75 MG PO TABS
75.0000 mg | ORAL_TABLET | Freq: Every day | ORAL | Status: DC
  Administered 2015-06-13 – 2015-06-14 (×2): 75 mg via ORAL
  Filled 2015-06-12 (×2): qty 1

## 2015-06-12 MED ORDER — ASPIRIN 81 MG PO CHEW
81.0000 mg | CHEWABLE_TABLET | Freq: Every day | ORAL | Status: DC
  Administered 2015-06-13 – 2015-06-14 (×2): 81 mg via ORAL
  Filled 2015-06-12 (×2): qty 1

## 2015-06-12 MED ORDER — OXYCODONE-ACETAMINOPHEN 5-325 MG PO TABS: 1.0000 | ORAL_TABLET | ORAL | Status: DC | PRN

## 2015-06-12 MED ORDER — OLMESARTAN MEDOXOMIL-HCTZ 40-25 MG PO TABS: 1.0000 | ORAL_TABLET | Freq: Every day | ORAL | Status: DC

## 2015-06-12 MED ORDER — SODIUM CHLORIDE 0.9 % IV SOLN
50000.0000 ug | INTRAVENOUS | Status: DC | PRN
  Administered 2015-06-12: 4 ug/kg/min via INTRAVENOUS

## 2015-06-12 MED ORDER — ACETAMINOPHEN 325 MG PO TABS: 650.0000 mg | ORAL_TABLET | ORAL | Status: DC | PRN

## 2015-06-12 MED ORDER — SODIUM CHLORIDE 0.9 % WEIGHT BASED INFUSION: 1.0000 mL/kg/h | INTRAVENOUS | Status: AC

## 2015-06-12 MED ORDER — SODIUM CHLORIDE 0.9 % IV SOLN: 250.0000 mg | INTRAVENOUS | Status: DC | PRN

## 2015-06-12 MED ORDER — ATORVASTATIN CALCIUM 80 MG PO TABS
80.0000 mg | ORAL_TABLET | Freq: Every day | ORAL | Status: DC
  Administered 2015-06-13: 80 mg via ORAL
  Filled 2015-06-12: qty 1

## 2015-06-12 MED ORDER — HEPARIN SODIUM (PORCINE) 1000 UNIT/ML IJ SOLN
INTRAMUSCULAR | Status: DC | PRN
  Administered 2015-06-12 (×2): 10000 [IU] via INTRAVENOUS

## 2015-06-12 MED ORDER — SODIUM CHLORIDE 0.9 % IV SOLN
4.0000 ug/kg/min | INTRAVENOUS | Status: AC
  Filled 2015-06-12: qty 50

## 2015-06-12 MED ORDER — FUROSEMIDE 10 MG/ML IJ SOLN
INTRAMUSCULAR | Status: DC | PRN
  Administered 2015-06-12: 40 mg via INTRAVENOUS

## 2015-06-12 MED ORDER — HEPARIN SODIUM (PORCINE) 1000 UNIT/ML IJ SOLN
INTRAMUSCULAR | Status: AC
  Filled 2015-06-12: qty 1

## 2015-06-12 MED ORDER — CLOPIDOGREL BISULFATE 300 MG PO TABS
ORAL_TABLET | ORAL | Status: DC | PRN
  Administered 2015-06-12: 600 mg via ORAL

## 2015-06-12 SURGICAL SUPPLY — 21 items
BALLN EMERGE MR 2.5X12 (BALLOONS) ×2
BALLN TREK RX 2.25X8 (BALLOONS) ×2
BALLN ~~LOC~~ EUPHORA RX 3.25X12 (BALLOONS) ×2
BALLOON EMERGE MR 2.5X12 (BALLOONS) IMPLANT
BALLOON TREK RX 2.25X8 (BALLOONS) IMPLANT
BALLOON ~~LOC~~ EUPHORA RX 3.25X12 (BALLOONS) IMPLANT
CATH EXTRAC PRONTO 5.5F 138CM (CATHETERS) ×1 IMPLANT
CATH INFINITI 5 FR JL3.5 (CATHETERS) ×2 IMPLANT
CATH INFINITI JR4 5F (CATHETERS) ×1 IMPLANT
CATH SITESEER 5F MULTI A 2 (CATHETERS) ×1 IMPLANT
DEVICE WIRE ANGIOSEAL 6FR (Vascular Products) ×1 IMPLANT
GUIDE CATH RUNWAY 6FR CLS3.5 (CATHETERS) ×1 IMPLANT
KIT HEART LEFT (KITS) ×2 IMPLANT
PACK CARDIAC CATHETERIZATION (CUSTOM PROCEDURE TRAY) ×2 IMPLANT
SHEATH PINNACLE 6F 10CM (SHEATH) ×1 IMPLANT
STENT PROMUS PREM MR 3.0X16 (Permanent Stent) ×1 IMPLANT
TRANSDUCER W/STOPCOCK (MISCELLANEOUS) ×2 IMPLANT
TUBING CIL FLEX 10 FLL-RA (TUBING) ×2 IMPLANT
WIRE ASAHI PROWATER 180CM (WIRE) ×1 IMPLANT
WIRE EMERALD 3MM-J .035X150CM (WIRE) ×1 IMPLANT
WIRE RUNTHROUGH .014X180CM (WIRE) ×1 IMPLANT

## 2015-06-12 NOTE — ED Notes (Signed)
Pt noted to be unresponsive and in VFib while at registration desk with EMS, cath lab staff, and RN. CPR started immediately by EMS. Pt shocked by EMS in hallway. Approx 1 min CPR and pt became responsive and nauseated. Taken straight to cath lab with EMS, cath lab staff, RT, and ED RN

## 2015-06-12 NOTE — H&P (Addendum)
History & Physical    Patient ID: Richard [name] MRN: [id number], DOB/AGE: [birth year]   Admit date: 06/12/2015   Primary Physician: Alysia PennaHOLWERDA, SCOTT, MD Primary Cardiologist: new - H. Katrinka BlazingSmith, MD   Patient Profile    70 y/o ? with a h/o HTN, HL, and ICH in 11/2014, who presented today with c/p and inflat ST elevation.  Past Medical History    Past Medical History  Diagnosis Date  . Diabetes mellitus without complication (HCC)   . Stroke (HCC)   . Left eye injury   . Intracranial hemorrhage (HCC)     a. 11/2014 in setting of marked HTN->3.5x2.5 left frontal ICH (12-1315ml by MRI); b. 11/2014 MRA no evidence of AVM or CAA; c. 11/2014 Carotid U/S: unremarkable.  . Hypertensive heart disease   . ETOH abuse     No past surgical history on file.   Allergies  No Known Allergies  History of Present Illness    70 y/o ? with a prior h/o HTN, ETOH abuse, and left frontal intracranial hemorrhage in 11/2014.  He has recovered well from that event and has been followed closely by neurology as an outpt.  He was in his usoh until this AM @ 3AM, when he awoke suddenly with severe bilateral shoulder pain that moved to the center of his chest.  After 7 hrs of ongoing c/p, his wife called EMS, and was found to have inferolateral ST elevation with anterior ST dep/TWI.  A Code STEMI was activated and [facility], he suffered VF and req defib x 1 and a brief period of CPR.  He was taken to the cath lab for emergent evaluation.  He is now s/p PCI to the LCX and pain free.  Home Medications    Prior to Admission medications   Medication Sig Start Date End Date Taking? Authorizing Provider  amLODipine (NORVASC) 10 MG tablet Take 1 tablet (10 mg total) by mouth daily. 12/26/14   Mcarthur Rossettianiel J Angiulli, PA-C  insulin NPH Human (HUMULIN N,NOVOLIN N) 100 UNIT/ML injection Inject into the skin. Takes 40 units in the am and 35 units in the pm, [facility] its a different brand  name, patient takes this brand t/c call made 05/13/2015    Historical Provider, MD  insulin regular (NOVOLIN R,HUMULIN R) 100 units/mL injection Inject into the skin 3 (three) times daily before meals. Inject 3 units at dinner    Historical Provider, MD  Multiple Vitamin (MULTIVITAMIN WITH MINERALS) TABS tablet Take 1 tablet by mouth daily. 12/26/14   Mcarthur Rossettianiel J Angiulli, PA-C  olmesartan-hydrochlorothiazide (BENICAR HCT) 40-25 MG tablet Take 1 tablet by mouth daily. 12/26/14   Mcarthur Rossettianiel J Angiulli, PA-C  vitamin B-12 100 MCG tablet Take 1 tablet (100 mcg total) by mouth daily. 12/26/14   Mcarthur Rossettianiel J Angiulli, PA-C    Family History    Family History  Problem Relation Age of Onset  . Stroke Maternal Grandmother     Social History    Social History   Social History  . Marital Status: Married    Spouse Name: N/A  . Number of Children: N/A  . Years of Education: N/A   Occupational History  . Not on file.   Social History Main Topics  . Smoking status: Never Smoker   . Smokeless tobacco: Not on file  . Alcohol Use: 0.6 oz/week    1 Cans of beer per week     Comment: prev heavy  .  Drug Use: Not on file  . Sexual Activity: Not on file   Other Topics Concern  . Not on file   Social History Narrative   Lives in [place] with wife.     Review of Systems    General:  No chills, fever, night sweats or weight changes.  Cardiovascular:  +++ chest pain, no dyspnea on exertion, edema, orthopnea, palpitations, paroxysmal nocturnal dyspnea. Dermatological: No rash, lesions/masses Respiratory: No cough, dyspnea Urologic: No hematuria, dysuria Abdominal:   No nausea, vomiting, diarrhea, bright red blood per rectum, melena, or hematemesis Neurologic:  No visual changes, wkns, changes in mental status. All other systems reviewed and are otherwise negative except as noted above.  Physical Exam    Blood pressure 135/83, pulse 87, resp. rate 18, weight 215 lb 6.2 oz (97.7 kg), SpO2 94 %.  General:  Pleasant, NAD Psych: Normal affect. Neuro: Alert and oriented X 3. Moves all extremities spontaneously. HEENT: Normal  Neck: Supple without bruits or JVD. Lungs:  Resp regular and unlabored, CTA. Heart: RRR no s3, s4, 2/6 SEM llsb. Abdomen: Soft, non-tender, non-distended, BS + x 4.  Extremities: No clubbing, cyanosis or edema. DP/PT/Radials 2+ and equal bilaterally.  Labs    Troponin [facility] Test)  Recent Labs  06/12/15 1144  TROPIPOC 0.02   Lab Results  Component Value Date   WBC 8.4 12/17/2014   HGB 14.6 06/12/2015   HCT 43.0 06/12/2015   MCV 87.8 12/17/2014   PLT 254 12/17/2014    Recent Labs Lab 06/12/15 1146  NA 140  K 3.4*  CL 100*  BUN 20  CREATININE 1.00  GLUCOSE 157*   Lab Results  Component Value Date   CHOL 187 12/11/2014   HDL 41 12/11/2014   LDLCALC 102* 12/11/2014   TRIG 218* 12/11/2014   No results found for: [facility]   Radiology Studies    Mr Laqueta [facility] Contrast  06/10/2015   [facility] [address] [place], [place] [address] [phone] NEUROIMAGING REPORT STUDY DATE: 06/08/2015 PATIENT NAME: Richard [name] DOB: 1945/06/18 MRN: [id number] ORDERING CLINICIAN: Dr Pearlean Brownie CLINICAL HISTORY:  36 year patient with ICH f/u COMPARISON FILMS: MRI Brain 12/10/2014 EXAM: MRI Brain w/wo TECHNIQUE: Sagittal T1, axial T1, T2, DWI, gradient-echo, coronal T2, postcontrast sagittal, coronal and axial T1 images were obtained on a 1.5 Tesla magnet. CONTRAST: 19 ML IV MultiHance IMAGING SITE: [place] imaging FINDINGS: The brain parenchyma shows chronic age left posterior frontal hematoma with hemosiderin lining and expected evolutionary changes with resolution of surrounding edema and shrinkage of the hematoma as compared with previous MRI from 12/10/14. There is a remote age right frontal infarct which is unchanged. There are moderate changes of chronic microvascular ischemia which also appear unchanged. No acute infarct is noted. Gradient  echo images show expected changes in the left frontal region. Subarachnoid spaces and ventricular system show mild dilatation. Orbits show artificial left globe. Paranasal sinuses show minor mucosal thickening. Flow voids of the large vessels of the intracranial circulation appear to be patent. Postcontrast images do not result in any abnormal areas of enhancement. Extra-axial brain structures and calvarium appear unremarkable.   06/10/2015   Abnormal MRI scan of the brain showing remote age left frontal hematoma with expected evolutionary changes. There are moderate changes of chronic microvascular ischemia and mild degree of generalized cerebral atrophy. No enhancing lesions are noted. Compared to previous MRI dated 12/10/14 no significant findings except expected evolutionary changes are noted as described above.  INTERPRETING PHYSICIAN: Delia Heady, MD Certified in  Neuroimaging by American Society of Neuroimaging and [employer]    ECG & Cardiac Imaging    RSR, 79, inflat ST elev with ant ST dep.  Assessment & Plan    1.  Acute inferolateral STEMI/VF Arrest:  S/p emergent cath and PCI of the LCX. Admit to CCU.  Cycle CE.  Add  blocker, asa, plavix, high potency statin.  Eventual cardiac rehab.  2.  Intracranial hemorrhage:  Seen by neuro.  Best to try and avoid prolonged IV anticoagulation.  Follow on asa/plavix.  Head CT per neuro.  3.  Hypertensive Heart Disease:  BP currently stable.  Neuro recs goal < 160/90.  4.  HL:  Cont high potency statin.  LDL 102 12/11/14.  5.  DM II:  Cont home doses of insulin.  Signed, Nicolasa Ducking, NP 06/12/2015, 1:26 PM    The patient has been seen in conjunction with Flavia Shipper, NP. All aspects of care have been considered and discussed. The patient has been personally interviewed, examined, and all clinical data has been reviewed.   Seen post VF arrest. Wife states pain onset 10 AM with nausea and  vomiting.  ECG c/w inferolateral STEMI  Despite VF arrest, awake and alert with ongoing chest pain.  Brought directly to the cath lab.  Plan is mechanical revascularization.  Complicating issue is intracranial hematoma 6 months ago. Will use heparin and plavix/aspirin.   Multiple phone calls to neurology and neurosurgery to coordinate care in high risk setting where intense anticoagulation will be required.  Time spent with the patient 35 minutes.

## 2015-06-12 NOTE — Progress Notes (Signed)
Pressure held to rfa site for approx . cangrelor d/c'd at 1448. Rt groin redressed. ooz seems to have stopped.

## 2015-06-12 NOTE — Progress Notes (Addendum)
CRITICAL VALUE ALERT  Critical value received:  Troponin 4.47  Date of notification:  06/12/15 @ 2100  Nurse who received alert:  Nelly LaurenceNatalie Brodey Bonn  MD notified (1st page): Turner  Responded: 21:15

## 2015-06-12 NOTE — Consult Note (Signed)
Stroke consult note  Referring Physician: Dr. Verdis PrimeHenry Smith    Chief Complaint: heparin use in the setting of MI  HPI: Richard [name] is an 70 y.o. male with PMH of DM DM, left frontal ICH in 11/2014, previous stroke without residual deficits, s/p artificial left eye was admitted for Vfib and MI. Pt was found to have unresponsiveness and in VFib this am, CPR started immediately by EMS. Pt shocked by EMS in hallway at registration desk. Approx 1 min CPR and pt became responsive and nauseated. Taken straight to cath lab. Found to have MI and neurology was consulted about heparin use for MI in the setting of previous ICH.     Past Medical History  Diagnosis Date  . Diabetes mellitus without complication (HCC)   . Stroke (HCC)   . Left eye injury     No past surgical history on file.  Family History  Problem Relation Age of Onset  . Stroke Maternal Grandmother    Social History:  reports that he has never smoked. He does not have any smokeless tobacco history on file. He reports that he drinks about 0.6 oz of alcohol per week. His drug history is not on file.  Allergies: No Known Allergies  Medications:  Prior to Admission:  Prescriptions prior to admission  Medication Sig Dispense Refill Last Dose  . amLODipine (NORVASC) 10 MG tablet Take 1 tablet (10 mg total) by mouth daily. 30 tablet 1 Taking  . insulin NPH Human (HUMULIN N,NOVOLIN N) 100 UNIT/ML injection Inject into the skin. Takes 40 units in the am and 35 units in the pm, [facility] its a different brand name, patient takes this brand t/c call made 05/13/2015   Taking  . insulin regular (NOVOLIN R,HUMULIN R) 100 units/mL injection Inject into the skin 3 (three) times daily before meals. Inject 3 units at dinner   Taking  . Multiple Vitamin (MULTIVITAMIN WITH MINERALS) TABS tablet Take 1 tablet by mouth daily.   Taking  . olmesartan-hydrochlorothiazide (BENICAR HCT) 40-25 MG tablet Take 1 tablet by mouth daily. 30 tablet  1 Taking  . vitamin B-12 100 MCG tablet Take 1 tablet (100 mcg total) by mouth daily. 30 tablet 1 Taking    ROS: Review of Systems: ROS was attempted today and was able to be performed.  Systems assessed include - Constitutional, Eyes,  HENT, Respiratory, Cardiovascular, Gastrointestinal, Genitourinary, Integument/breast, Hematologic/lymphatic, Musculoskeletal, Neurological, Behavioral/Psych, Endocrine,  Allergic/Immunologic - the patient complains of only the following symptoms, and all other reviewed systems are negative.   Physical Examination: SpO2 100 %.   Exam deferred due to pt on the cath lab table cardiac cath.  Neurologic Examination:  Neuro exam deferred due to pt on the cath lab table cardiac cath.   Results for orders placed or performed during the hospital encounter of 06/12/15 (from the past 48 hour(s))  POCT i-Stat troponin I     Status: None   Collection Time: 06/12/15 11:44 AM  Result Value Ref Range   Troponin i, poc 0.02 0.00 - 0.08 ng/mL   Comment 3            Comment: Due to the release kinetics of cTnI, a negative result within the first hours of the onset of symptoms does not rule out myocardial infarction with certainty. If myocardial infarction is still suspected, repeat the test at appropriate intervals.   I-STAT, chem 8     Status: Abnormal   Collection Time: 06/12/15 11:46 AM  Result Value Ref Range   Sodium 140 135 - 145 mmol/L   Potassium 3.4 (L) 3.5 - 5.1 mmol/L   Chloride 100 (L) 101 - 111 mmol/L   BUN 20 6 - 20 mg/dL   Creatinine, Ser 1.61 0.61 - 1.24 mg/dL   Glucose, Bld 096 (H) 65 - 99 mg/dL   Calcium, Ion 0.45 (L) 1.13 - 1.30 mmol/L   TCO2 29 0 - 100 mmol/L   Hemoglobin 14.6 13.0 - 17.0 g/dL   HCT 40.9 81.1 - 91.4 %   No results found.  Assessment: 70 y.o. male with PMH of DM DM, left frontal ICH in 11/2014, previous stroke without residual deficits, s/p artificial left eye was admitted for Vfib and MI. Currently at cath lab for  cardiac cath. Neurology was consulted for heparin use in the setting of cortical ICH last September. His left cortical ICH in September was most likely due to hypertensive bleeding due to SBP >200 on arrival at that time, and he has home BP meds including norvasc and benicar. His ICH is less likely to be related to CAA due to his young age and lack of typical changes on MRI GRE sequences. However, cortical location of the ICH always keep CAA at differential. His repeat MRI has ruled out underlying AVM, aneurysm or brain tumor. He has followed up with Dr. Pearlean Brownie in stroke clinic.   At this time, due to his MI and life threatening condition, cardiology feels that IV heparin is needed during periprocedure period. There is no plan to continue IV heparin post procedure. I concur with the decision. He should not be excluded from heparin IV for life threatening conditions. Although there is risk for recurrent ICH, but benefit is outweigh the risk. Certainly, no heparin IV long term post procedure is reassuring. Please control the BP < 160/90 may also help to minimize the risk of recurrent ICH. If there is any acute neuro changes during or post procedure, please perform stat CT head without contrast and let us know too.    Plan: - agree the use of heparin IV in the life-threatening condition - no long term heparin IV or anticoagulation is reassuring - maintain SBP < 160/90 may help to minimize the risk of re-bleeding - stat head CT without contrast for acute neuro changes - pt will continue to follow up with Dr. Pearlean Brownie as scheduled on 08/11/15.  Neurology will sign off. Please call with questions. Thanks for the consult.  Marvel Plan, MD PhD Stroke Neurology 06/12/2015 12:56 PM

## 2015-06-12 NOTE — Progress Notes (Signed)
Rt groin dressing saturated. Dressing removed. No hematoma, slow ooze. Manual pressure held for 15 minutes and redressed.

## 2015-06-12 NOTE — ED Provider Notes (Signed)
CSN: [id number]     Arrival date & time 06/12/15  1137 History   First MD Initiated Contact with Patient 06/12/15 1140     No chief complaint on file.    (Consider location/radiation/quality/duration/timing/severity/associated sxs/prior Treatment) HPI   Patient is a 70 year old male presenting with chest pain and STEMI. Patient arrived as a code STEMI into hallway. There were no open beds in the Cath Lab.   [facility] Patient lost pulses in the hallway.  Coded in the hallway brought to trauma C. Found to be in V. fib. Patient was shocked. CPR initiated. Patient awoke.  Called for cath lab. Patient merely transported to Cath Lab.  Level 5 caveat: acuity.  Past Medical History  Diagnosis Date  . Diabetes mellitus without complication (HCC)   . Stroke (HCC)   . Left eye injury    No past surgical history on file. Family History  Problem Relation Age of Onset  . Stroke Maternal Grandmother    Social History  Substance Use Topics  . Smoking status: Never Smoker   . Smokeless tobacco: Not on file  . Alcohol Use: 0.6 oz/week    1 Cans of beer per week     Comment: occasional     Review of Systems  Unable to perform ROS: Acuity of condition      Allergies  Review of patient's allergies indicates no known allergies.  Home Medications   Prior to Admission medications   Medication Sig Start Date End Date Taking? Authorizing Provider  amLODipine (NORVASC) 10 MG tablet Take 1 tablet (10 mg total) by mouth daily. 12/26/14   Mcarthur Rossettianiel J Angiulli, PA-C  insulin NPH Human (HUMULIN N,NOVOLIN N) 100 UNIT/ML injection Inject into the skin. Takes 40 units in the am and 35 units in the pm, [facility] its a different brand name, patient takes this brand t/c call made 05/13/2015    Historical Provider, MD  insulin regular (NOVOLIN R,HUMULIN R) 100 units/mL injection Inject into the skin 3 (three) times daily before meals. Inject 3 units at dinner     Historical Provider, MD  Multiple Vitamin (MULTIVITAMIN WITH MINERALS) TABS tablet Take 1 tablet by mouth daily. 12/26/14   Mcarthur Rossettianiel J Angiulli, PA-C  olmesartan-hydrochlorothiazide (BENICAR HCT) 40-25 MG tablet Take 1 tablet by mouth daily. 12/26/14   Mcarthur Rossettianiel J Angiulli, PA-C  vitamin B-12 100 MCG tablet Take 1 tablet (100 mcg total) by mouth daily. 12/26/14   Daniel J Angiulli, PA-C   Wt 215 lb 6.2 oz (97.7 kg)  SpO2 100% Physical Exam  Constitutional: He appears well-nourished.  HENT:  Head: Normocephalic.  Eyes: Conjunctivae are normal.  Cardiovascular: Normal rate.   Pulmonary/Chest: Effort normal.  Neurological:  Oriented to self.  Skin: Skin is warm and dry. He is not diaphoretic.    ED Course  Procedures (including critical care time) Labs Review Labs Reviewed  POCT I-STAT, CHEM 8 - Abnormal; Notable for the following:    Potassium 3.4 (*)    Chloride 100 (*)    Glucose, Bld 157 (*)    Calcium, Ion 1.11 (*)    All other components within normal limits  COMPREHENSIVE METABOLIC PANEL  CBC WITH DIFFERENTIAL/PLATELET  TROPONIN I  LIPID PANEL  HEMOGLOBIN A1C  PROTIME-INR  APTT  I-STAT CHEM 8, ED  I-STAT TROPOININ, ED  POCT I-STAT TROPONIN I    Imaging Review No results found. I have personally reviewed and evaluated these images and lab results  as part of my medical decision-making.   EKG Interpretation None       EKG from EMS shows ST elevation in 2, 3, and avF with depere4ssion in V2, elevation in V5 and v6    MDM   Final diagnoses:  None    Patient 70 year old male brought in as a code STEMI. Was awaiting bed in Cath Lab while on [facility] stretcher when he went into V. fib and coated.., See. CPR initiated. Shock given. Patient awoke alert to self. Cath lab table was emptied and patient went up to Cath Lab.  Cardiopulmonary Resuscitation (CPR) Procedure Note Directed/Performed by: Arlana Hove I personally directed ancillary staff and/or performed CPR  in an effort to regain return of spontaneous circulation and to maintain cardiac, neuro and systemic perfusion.    CRITICAL CARE Performed by: Arlana Hove Total critical care time: 15 minutes Critical care time was exclusive of separately billable procedures and treating other patients. Critical care was necessary to treat or prevent imminent or life-threatening deterioration. Critical care was time spent personally by me on the following activities: development of treatment plan with patient and/or surrogate as well as nursing, discussions with consultants, evaluation of patient's response to treatment, examination of patient, obtaining history from patient or surrogate, ordering and performing treatments and interventions, ordering and review of laboratory studies, ordering and review of radiographic studies, pulse oximetry and re-evaluation of patient's condition.     Ondine Gemme Randall An, MD 06/12/15 1248

## 2015-06-13 ENCOUNTER — Encounter (HOSPITAL_COMMUNITY): Payer: Self-pay | Admitting: Interventional Cardiology

## 2015-06-13 DIAGNOSIS — I2121 ST elevation (STEMI) myocardial infarction involving left circumflex coronary artery: Secondary | ICD-10-CM

## 2015-06-13 LAB — COMPREHENSIVE METABOLIC PANEL
ALT: 14 U/L — AB (ref 17–63)
AST: 40 U/L (ref 15–41)
Albumin: 3.4 g/dL — ABNORMAL LOW (ref 3.5–5.0)
Alkaline Phosphatase: 63 U/L (ref 38–126)
Anion gap: 10 (ref 5–15)
BUN: 15 mg/dL (ref 6–20)
CHLORIDE: 101 mmol/L (ref 101–111)
CO2: 27 mmol/L (ref 22–32)
CREATININE: 1.13 mg/dL (ref 0.61–1.24)
Calcium: 9.1 mg/dL (ref 8.9–10.3)
GFR calc non Af Amer: >60 mL/min (ref >60)
Glucose, Bld: 89 mg/dL (ref 65–99)
Potassium: 3.2 mmol/L — ABNORMAL LOW (ref 3.5–5.1)
SODIUM: 138 mmol/L (ref 135–145)
Total Bilirubin: 1 mg/dL (ref 0.3–1.2)
Total Protein: 6.8 g/dL (ref 6.5–8.1)

## 2015-06-13 LAB — BASIC METABOLIC PANEL
ANION GAP: 11 (ref 5–15)
BUN: 17 mg/dL (ref 6–20)
CALCIUM: 9.1 mg/dL (ref 8.9–10.3)
CO2: 28 mmol/L (ref 22–32)
CREATININE: 1.35 mg/dL — AB (ref 0.61–1.24)
Chloride: 98 mmol/L — ABNORMAL LOW (ref 101–111)
GFR calc Af Amer: >60 mL/min (ref >60)
GFR, EST NON AFRICAN AMERICAN: 52 mL/min — AB (ref >60)
GLUCOSE: 239 mg/dL — AB (ref 65–99)
Potassium: 3.7 mmol/L (ref 3.5–5.1)
Sodium: 137 mmol/L (ref 135–145)

## 2015-06-13 LAB — GLUCOSE, CAPILLARY
GLUCOSE-CAPILLARY: 165 mg/dL — AB (ref 65–99)
GLUCOSE-CAPILLARY: 113 mg/dL — AB (ref 65–99)
Glucose-Capillary: 52 mg/dL — ABNORMAL LOW (ref 65–99)
Glucose-Capillary: 100 mg/dL — ABNORMAL HIGH (ref 65–99)
Glucose-Capillary: 192 mg/dL — ABNORMAL HIGH (ref 65–99)

## 2015-06-13 LAB — TROPONIN I
TROPONIN I: 7.39 ng/mL — AB (ref <0.031)
TROPONIN I: 6.66 ng/mL — AB (ref <0.031)
TROPONIN I: 5.51 ng/mL — AB (ref <0.031)
TROPONIN I: 3.56 ng/mL — AB (ref <0.031)
TROPONIN I: 3.4 ng/mL — AB (ref <0.031)

## 2015-06-13 LAB — CBC
HCT: 40.6 % (ref 39.0–52.0)
HEMOGLOBIN: 13.2 g/dL (ref 13.0–17.0)
MCH: 28.2 pg (ref 26.0–34.0)
MCHC: 32.5 g/dL (ref 30.0–36.0)
MCV: 86.8 fL (ref 78.0–100.0)
Platelets: 226 10*3/uL (ref 150–400)
RBC: 4.68 MIL/uL (ref 4.22–5.81)
RDW: 12.3 % (ref 11.5–15.5)
WBC: 13.2 10*3/uL — ABNORMAL HIGH (ref 4.0–10.5)

## 2015-06-13 LAB — HEMOGLOBIN A1C
Hgb A1c MFr Bld: 7.5 % — ABNORMAL HIGH (ref 4.8–5.6)
MEAN PLASMA GLUCOSE: 169 mg/dL

## 2015-06-13 LAB — PLATELET INHIBITION P2Y12: PLATELET FUNCTION P2Y12: 204 [PRU] (ref 194–418)

## 2015-06-13 MED ORDER — LISINOPRIL 5 MG PO TABS
5.0000 mg | ORAL_TABLET | Freq: Every day | ORAL | Status: DC
  Administered 2015-06-13 – 2015-06-14 (×2): 5 mg via ORAL
  Filled 2015-06-13 (×2): qty 1

## 2015-06-13 MED ORDER — POTASSIUM CHLORIDE CRYS ER 20 MEQ PO TBCR
40.0000 meq | EXTENDED_RELEASE_TABLET | Freq: Once | ORAL | Status: AC
  Administered 2015-06-13: 40 meq via ORAL

## 2015-06-13 MED ORDER — CARVEDILOL 6.25 MG PO TABS
6.2500 mg | ORAL_TABLET | Freq: Two times a day (BID) | ORAL | Status: DC
  Administered 2015-06-13 – 2015-06-14 (×3): 6.25 mg via ORAL
  Filled 2015-06-13 (×3): qty 1

## 2015-06-13 MED ORDER — POTASSIUM CHLORIDE CRYS ER 10 MEQ PO TBCR
EXTENDED_RELEASE_TABLET | ORAL | Status: AC
  Filled 2015-06-13: qty 4

## 2015-06-13 NOTE — Progress Notes (Signed)
Pt. Requested that I check his blood sugar.  It was 52.  He said this was a normal finding for this time in the morning.  I gave him cranberry juice and will recheck.

## 2015-06-13 NOTE — Progress Notes (Signed)
Critical troponin of 7.39.  Dr. Mayford Knifeurner notified. Will continue to monitor.

## 2015-06-13 NOTE — Progress Notes (Signed)
Patient ID: Richard [name], male   DOB: Dec 16, 1945, 70 y.o.   MRN: [id number]   SUBJECTIVE: No further chest pain.  Feeling good overall this morning.    Scheduled Meds: . amLODipine  10 mg Oral Daily  . antiseptic oral rinse  7 mL Mouth Rinse BID  . aspirin  81 mg Oral Daily  . atorvastatin  80 mg Oral q1800  . carvedilol  6.25 mg Oral BID WC  . clopidogrel  75 mg Oral Q breakfast  . insulin aspart  0-15 Units Subcutaneous TID WC  . insulin NPH Human  35 Units Subcutaneous Q supper  . insulin NPH Human  40 Units Subcutaneous QAC breakfast  . lisinopril  5 mg Oral Daily  . potassium chloride      . sodium chloride flush  3 mL Intravenous Q12H   Continuous Infusions:  PRN Meds:.sodium chloride, acetaminophen, ondansetron (ZOFRAN) IV, oxyCODONE-acetaminophen, sodium chloride flush    Filed Vitals:   06/13/15 0347 06/13/15 0400 06/13/15 0500 06/13/15 0600  BP:  119/71 120/72 134/72  Pulse:  62 65 65  Temp: 98 F (36.7 C)     TempSrc: Oral     Resp:  Height:      Weight:      SpO2:  98% 97% 95%    Intake/Output Summary (Last 24 hours) at 06/13/15 0805 Last data filed at 06/13/15 0600  Gross per 24 hour  Intake    868 ml  Output   1200 ml  Net   -332 ml    LABS: Basic Metabolic Panel:  Recent Labs  40/98/11 1314 06/13/15 0445  NA 139 138  K 3.6 3.2*  CL 97* 101  CO2 29 27  GLUCOSE 195* 89  BUN 14 15  CREATININE 1.12 1.13  CALCIUM 9.3 9.1   Liver Function Tests:  Recent Labs  06/12/15 1314 06/13/15 0445  AST 23 40  ALT 12* 14*  ALKPHOS 74 63  BILITOT 0.8 1.0  PROT 7.1 6.8  ALBUMIN 3.8 3.4*   No results for input(s): LIPASE, AMYLASE in the last 72 hours. CBC:  Recent Labs  06/12/15 1314 06/13/15 0445  WBC 13.8* 13.2*  NEUTROABS 12.0*  --   HGB 14.2 13.2  HCT 43.0 40.6  MCV 87.2 86.8  PLT 249 226   Cardiac Enzymes:  Recent Labs  06/12/15 2000 06/12/15 2240 06/13/15 0445  TROPONINI 4.47* 7.39* 6.66*   BNP: Invalid input(s):  POCBNP D-Dimer: No results for input(s): DDIMER in the last 72 hours. Hemoglobin A1C:  Recent Labs  06/12/15 1314  HGBA1C 7.5*   Fasting Lipid Panel:  Recent Labs  06/12/15 1314  CHOL 288*  HDL 42  LDLCALC 236*  TRIG 52  CHOLHDL 6.9   Thyroid Function Tests: No results for input(s): TSH, T4TOTAL, T3FREE, THYROIDAB in the last 72 hours.  Invalid input(s): FREET3 Anemia Panel: No results for input(s): VITAMINB12, FOLATE, FERRITIN, TIBC, IRON, RETICCTPCT in the last 72 hours.  RADIOLOGY: Mr Richard [name] Contrast  06/10/2015   [facility] [address] [place], [place] [address] [phone] NEUROIMAGING REPORT STUDY DATE: 06/08/2015 PATIENT NAME: Richard [name] DOB: [birth year] MRN: [id number] ORDERING CLINICIAN: Dr Richard [name] CLINICAL HISTORY:  45 year patient with ICH f/u COMPARISON FILMS: MRI Brain 12/10/2014 EXAM: MRI Brain w/wo TECHNIQUE: Sagittal T1, axial T1, T2, DWI, gradient-echo, coronal T2, postcontrast sagittal, coronal and axial T1 images were obtained on a 1.5 Tesla magnet. CONTRAST: 19 ML IV MultiHance IMAGING  SITE: [place] [facility] FINDINGS: The brain parenchyma shows chronic age left posterior frontal hematoma with hemosiderin lining and expected evolutionary changes with resolution of surrounding edema and shrinkage of the hematoma as compared with previous MRI from 12/10/14. There is a remote age right frontal infarct which is unchanged. There are moderate changes of chronic microvascular ischemia which also appear unchanged. No acute infarct is noted. Gradient echo images show expected changes in the left frontal region. Subarachnoid spaces and ventricular system show mild dilatation. Orbits show artificial left globe. Paranasal sinuses show minor mucosal thickening. Flow voids of the large vessels of the intracranial circulation appear to be patent. Postcontrast images do not result in any abnormal areas of enhancement. Extra-axial brain structures and  calvarium appear unremarkable.   06/10/2015   Abnormal MRI scan of the brain showing remote age left frontal hematoma with expected evolutionary changes. There are moderate changes of chronic microvascular ischemia and mild degree of generalized cerebral atrophy. No enhancing lesions are noted. Compared to previous MRI dated 12/10/14 no significant findings except expected evolutionary changes are noted as described above. INTERPRETING PHYSICIAN: Richard HeadyPRAMOD SETHI, MD Certified in  Neuroimaging by American Society of Neuroimaging and [employer]    PHYSICAL EXAM General: NAD Neck: No JVD, no thyromegaly or thyroid nodule.  Lungs: Clear to auscultation bilaterally with normal respiratory effort. CV: Nondisplaced PMI.  Heart regular S1/S2, no S3/S4, no murmur.  No peripheral edema.  No carotid bruit.  Normal pedal pulses.  Abdomen: Soft, nontender, no hepatosplenomegaly, no distention.  Neurologic: Alert and oriented x 3.  Psych: Normal affect. Extremities: No clubbing or cyanosis.   TELEMETRY: Reviewed telemetry pt in NSR  ASSESSMENT AND PLAN: 70 yo with history of ETOH abuse, HTN, and left frontal intracerebral hemorrhage in 9/16 presented with inferolateral STEMI on 3/17.  Event was complicated by VF arrest.  1. CAD: Inferolateral STEMI with DES to totally occluded OM1 on 3/17.   - Continue ASA 81 and Plavix (PRU 203, ok).  - Continue high dose statin.  - out of bed today with cardiac rehab.  2. VF arrest: In setting of STEMI, now s/p PCI. Will continue beta blocker.  3. Ischemic cardiomyopathy: EF 40-45% on LV-gram.  Not volume overloaded on exam.  - Will get echo today.  - Will treat with Coreg and lisinopril.  4. H/o ICH: Seen by neurology, recommended minimization of time on anticoagulation (heparin) and avoiding markedly high BP.   Can go to telemetry today, if doing well can go home tomorrow.   Richard [name] 06/13/2015

## 2015-06-13 NOTE — Progress Notes (Signed)
CARDIAC REHAB PHASE I   PRE:  Rate/Rhythm: 77 SR  BP:  Supine:   Sitting: 131/72  Standing:    SaO2: 97 RA  MODE:  Ambulation: 550 ft   POST:  Rate/Rhythm: 86  BP:  Supine:   Sitting: 134/64  Standing:    SaO2: 97 RA 1345-1445 Pt tolerated ambulation well without c/o of cp or SOB. VS stable. Completed MI and stent education with pt. He voices understanding. Referral sent to Outpt. CRP in [place]. Placed MI video on for pt to watch.  Melina CopaLisa Zanetta Dehaan RN 06/13/2015 2:48 PM

## 2015-06-14 ENCOUNTER — Encounter (HOSPITAL_COMMUNITY): Payer: Self-pay | Admitting: Nurse Practitioner

## 2015-06-14 DIAGNOSIS — I2121 ST elevation (STEMI) myocardial infarction involving left circumflex coronary artery: Secondary | ICD-10-CM | POA: Diagnosis not present

## 2015-06-14 DIAGNOSIS — E785 Hyperlipidemia, unspecified: Secondary | ICD-10-CM

## 2015-06-14 DIAGNOSIS — I119 Hypertensive heart disease without heart failure: Secondary | ICD-10-CM

## 2015-06-14 LAB — GLUCOSE, CAPILLARY
GLUCOSE-CAPILLARY: 76 mg/dL (ref 65–99)
Glucose-Capillary: 185 mg/dL — ABNORMAL HIGH (ref 65–99)

## 2015-06-14 LAB — BASIC METABOLIC PANEL
ANION GAP: 11 (ref 5–15)
BUN: 24 mg/dL — ABNORMAL HIGH (ref 6–20)
CALCIUM: 8.8 mg/dL — AB (ref 8.9–10.3)
CO2: 26 mmol/L (ref 22–32)
CREATININE: 1.28 mg/dL — AB (ref 0.61–1.24)
Chloride: 99 mmol/L — ABNORMAL LOW (ref 101–111)
GFR, EST NON AFRICAN AMERICAN: 55 mL/min — AB (ref >60)
Glucose, Bld: 264 mg/dL — ABNORMAL HIGH (ref 65–99)
Potassium: 3.8 mmol/L (ref 3.5–5.1)
Sodium: 136 mmol/L (ref 135–145)

## 2015-06-14 LAB — CBC
HEMATOCRIT: 37.9 % — AB (ref 39.0–52.0)
HEMOGLOBIN: 12.4 g/dL — AB (ref 13.0–17.0)
MCH: 28.2 pg (ref 26.0–34.0)
MCHC: 32.7 g/dL (ref 30.0–36.0)
MCV: 86.3 fL (ref 78.0–100.0)
Platelets: 215 10*3/uL (ref 150–400)
RBC: 4.39 MIL/uL (ref 4.22–5.81)
RDW: 12.3 % (ref 11.5–15.5)
WBC: 12.6 10*3/uL — AB (ref 4.0–10.5)

## 2015-06-14 LAB — TROPONIN I
TROPONIN I: 3.81 ng/mL — AB (ref <0.031)
TROPONIN I: 2.67 ng/mL — AB (ref <0.031)

## 2015-06-14 MED ORDER — CARVEDILOL 6.25 MG PO TABS: 6.2500 mg | ORAL_TABLET | Freq: Two times a day (BID) | ORAL | Status: AC

## 2015-06-14 MED ORDER — CLOPIDOGREL BISULFATE 75 MG PO TABS: 75.0000 mg | ORAL_TABLET | Freq: Every day | ORAL | Status: DC

## 2015-06-14 MED ORDER — LISINOPRIL 5 MG PO TABS: 5.0000 mg | ORAL_TABLET | Freq: Every day | ORAL | Status: DC

## 2015-06-14 MED ORDER — ATORVASTATIN CALCIUM 80 MG PO TABS: 80.0000 mg | ORAL_TABLET | Freq: Every day | ORAL | Status: DC

## 2015-06-14 MED FILL — Medication: Qty: 1 | Status: AC

## 2015-06-14 NOTE — Progress Notes (Signed)
Patient ID: Richard [name], male   DOB: 1945/04/05, 70 y.o.   MRN: [id number]   SUBJECTIVE: No CP or dyspnea  Scheduled Meds: . amLODipine  10 mg Oral Daily  . antiseptic oral rinse  7 mL Mouth Rinse BID  . aspirin  81 mg Oral Daily  . atorvastatin  80 mg Oral q1800  . carvedilol  6.25 mg Oral BID WC  . clopidogrel  75 mg Oral Q breakfast  . insulin aspart  0-15 Units Subcutaneous TID WC  . insulin NPH Human  35 Units Subcutaneous Q supper  . insulin NPH Human  40 Units Subcutaneous QAC breakfast  . lisinopril  5 mg Oral Daily  . sodium chloride flush  3 mL Intravenous Q12H   Continuous Infusions:  PRN Meds:.sodium chloride, acetaminophen, ondansetron (ZOFRAN) IV, oxyCODONE-acetaminophen, sodium chloride flush    Filed Vitals:   06/13/15 1649 06/13/15 2011 06/14/15 0624 06/14/15 0855  BP: 101/69 115/66 106/59 115/58  Pulse: 79 72 73 77  Temp:  99.1 F (37.3 C) 98.9 F (37.2 C)   TempSrc:  Oral    Resp:  18 20   Height:      Weight:   211 lb 6.4 oz (95.89 kg)   SpO2:  97% 98%     Intake/Output Summary (Last 24 hours) at 06/14/15 0925 Last data filed at 06/14/15 4098  Gross per 24 hour  Intake   1203 ml  Output      1 ml  Net   1202 ml    LABS: Basic Metabolic Panel:  Recent Labs  11/91/47 0445 06/13/15 1025  NA 138 137  K 3.2* 3.7  CL 101 98*  CO2 27 28  GLUCOSE 89 239*  BUN 15 17  CREATININE 1.13 1.35*  CALCIUM 9.1 9.1   Liver Function Tests:  Recent Labs  06/12/15 1314 06/13/15 0445  AST 23 40  ALT 12* 14*  ALKPHOS 74 63  BILITOT 0.8 1.0  PROT 7.1 6.8  ALBUMIN 3.8 3.4*   CBC:  Recent Labs  06/12/15 1314 06/13/15 0445 06/14/15 0441  WBC 13.8* 13.2* 12.6*  NEUTROABS 12.0*  --   --   HGB 14.2 13.2 12.4*  HCT 43.0 40.6 37.9*  MCV 87.2 86.8 86.3  PLT 249 226 215   Cardiac Enzymes:  Recent Labs  06/13/15 1636 06/13/15 2243 06/14/15 0441  TROPONINI 3.56* 3.40* 3.81*   Hemoglobin A1C:  Recent Labs  06/12/15 1314  HGBA1C 7.5*    Fasting Lipid Panel:  Recent Labs  06/12/15 1314  CHOL 288*  HDL 42  LDLCALC 236*  TRIG 52  CHOLHDL 6.9    RADIOLOGY: Mr [name] Contrast  06/10/2015   [facility] [address] [place], [place] [address] [phone] NEUROIMAGING REPORT STUDY DATE: 06/08/2015 PATIENT NAME: Richard [name] DOB: [birth year] MRN: [id number] ORDERING CLINICIAN: Dr Pearlean Brownie CLINICAL HISTORY:  34 year patient with ICH f/u COMPARISON FILMS: MRI Brain 12/10/2014 EXAM: MRI Brain w/wo TECHNIQUE: Sagittal T1, axial T1, T2, DWI, gradient-echo, coronal T2, postcontrast sagittal, coronal and axial T1 images were obtained on a 1.5 Tesla magnet. CONTRAST: 19 ML IV MultiHance IMAGING SITE: [facility] FINDINGS: The brain parenchyma shows chronic age left posterior frontal hematoma with hemosiderin lining and expected evolutionary changes with resolution of surrounding edema and shrinkage of the hematoma as compared with previous MRI from 12/10/14. There is a remote age right frontal infarct which is unchanged. There are moderate changes of chronic microvascular ischemia which also  appear unchanged. No acute infarct is noted. Gradient echo images show expected changes in the left frontal region. Subarachnoid spaces and ventricular system show mild dilatation. Orbits show artificial left globe. Paranasal sinuses show minor mucosal thickening. Flow voids of the large vessels of the intracranial circulation appear to be patent. Postcontrast images do not result in any abnormal areas of enhancement. Extra-axial brain structures and calvarium appear unremarkable.   06/10/2015   Abnormal MRI scan of the brain showing remote age left frontal hematoma with expected evolutionary changes. There are moderate changes of chronic microvascular ischemia and mild degree of generalized cerebral atrophy. No enhancing lesions are noted. Compared to previous MRI dated 12/10/14 no significant findings except expected  evolutionary changes are noted as described above. INTERPRETING PHYSICIAN: PRAMOD SETHI, MD Certified in  Neuroimaging by American Society of Neuroimaging and [employer]    PHYSICAL EXAM General: NAD HEENT: normal Neck: Supple Lungs: CTA CV: RRR Abdomen: Soft, nontender, no masses Right groin with no hematoma or bruit Neurologic: Grossly intact Extremities: No edema  TELEMETRY: Reviewed telemetry pt in NSR  ASSESSMENT AND PLAN: 70 yo with history of ETOH abuse, HTN, and left frontal intracerebral hemorrhage in 9/16 presented with inferolateral STEMI on 3/17.  Event was complicated by VF arrest.  1. CAD: Inferolateral STEMI with DES to totally occluded OM1 on 3/17.   - Continue ASA 81 and Plavix - Continue high dose statin and coreg.  2. VF arrest: In setting of STEMI, now s/p PCI. Will continue beta blocker.  3. Ischemic cardiomyopathy: EF 40-45% on LV-gram.  - Will treat with Coreg and increase lisinopril as outpt as tolerated.  4. H/o ICH: Seen by neurology, recommended minimization of time on anticoagulation (heparin) and avoiding markedly high BP.  5. Acute kidney disease-Cr increased yesterday; possibly related to contrast and cardiac arrest. Repeat BMET today.  If Cr stable, DC today and Fu Dr Katrinka BlazingSmith > 30 min PA and physician time D2  Olga MillersBrian Crenshaw 06/14/2015

## 2015-06-14 NOTE — Discharge Instructions (Signed)
**PLEASE REMEMBER TO BRING ALL OF YOUR MEDICATIONS TO EACH OF YOUR FOLLOW-UP OFFICE VISITS.  NO HEAVY LIFTING X 4 WEEKS. NO SEXUAL ACTIVITY X 4 WEEKS. NO DRIVING X 2 WEEKS. NO SOAKING BATHS, HOT TUBS, POOLS, ETC., X 7 DAYS.  Groin Site Care Refer to this sheet in the next few weeks. These instructions provide you with information on caring for yourself after your procedure. Your caregiver may also give you more specific instructions. Your treatment has been planned according to current medical practices, but problems sometimes occur. Call your caregiver if you have any problems or questions after your procedure. HOME CARE INSTRUCTIONS  You may shower 24 hours after the procedure. Remove the bandage (dressing) and gently wash the site with plain soap and water. Gently pat the site dry.   Do not apply powder or lotion to the site.   Do not sit in a bathtub, swimming pool, or whirlpool for 5 to 7 days.   No bending, squatting, or lifting anything over 10 pounds (4.5 kg) as directed by your caregiver.   Inspect the site at least twice daily.   What to expect:  Any bruising will usually fade within 1 to 2 weeks.   Blood that collects in the tissue (hematoma) may be painful to the touch. It should usually decrease in size and tenderness within 1 to 2 weeks.  SEEK IMMEDIATE MEDICAL CARE IF:  You have unusual pain at the groin site or down the affected leg.   You have redness, warmth, swelling, or pain at the groin site.   You have drainage (other than a small amount of blood on the dressing).   You have chills.   You have a fever or persistent symptoms for more than 72 hours.   You have a fever and your symptoms suddenly get worse.   Your leg becomes pale, cool, tingly, or numb.  You have heavy bleeding from the site. Hold pressure on the site. Marland Kitchen   Heart Attack A heart attack (myocardial infarction, MI) causes damage to the heart that cannot be fixed. A heart attack often  happens when a blood clot or other blockage cuts blood flow to the heart. When this happens, certain areas of the heart begin to die. This causes the pain you feel during a heart attack. HOME CARE  Take medicine as told by your doctor. You may need medicine to:  Keep your blood from clotting too easily.  Control your blood pressure.  Lower your cholesterol.  Control abnormal heart rhythms.  Change certain behaviors as told by your doctor. This may include:  Quitting smoking.  Being active.  Eating a heart-healthy diet. Ask your doctor for help with this diet.  Keeping a healthy weight.  Keeping your diabetes under control.  Lessening stress.  Limiting how much alcohol you drink. Do not take these medicines unless your doctor says that you can:  Nonsteroidal anti-inflammatory drugs (NSAIDs). These include:  Ibuprofen.  Naproxen.  Celecoxib.  Vitamin supplements that have vitamin A, vitamin E, or both.  Hormone therapy that contains estrogen with or without progestin. GET HELP RIGHT AWAY IF:  You have sudden chest discomfort.  You have sudden discomfort in your:  Arms.  Back.  Neck.  Jaw.  You have shortness of breath at any time.  You have sudden sweating or clammy skin.  You feel sick to your stomach (nauseous) or throw up (vomit).  You suddenly get light-headed or dizzy.  You feel your heart beating  fast or skipping beats. These symptoms may be an emergency. Do not wait to see if the symptoms will go away. Get medical help right away. Call your local emergency services (911 in the [place] Do not drive yourself to the hospital.   This information is not intended to replace advice given to you by your health care provider. Make sure you discuss any questions you have with your health care provider.   Document Released: 09/13/2011 Document Revised: 07/29/2014 Document Reviewed: 05/17/2013 Elsevier Interactive Patient Education 2016 [employer].  Diabetes Mellitus and Food It is important for you to manage your blood sugar (glucose) level. Your blood glucose level can be greatly affected by what you eat. Eating healthier foods in the appropriate amounts throughout the day at about the same time each day will help you control your blood glucose level. It can also help slow or prevent worsening of your diabetes mellitus. Healthy eating may even help you improve the level of your blood pressure and reach or maintain a healthy weight.  General recommendations for healthful eating and cooking habits include:  Eating meals and snacks regularly. Avoid going long periods of time without eating to lose weight.  Eating a diet that consists mainly of plant-based foods, such as fruits, vegetables, nuts, legumes, and whole grains.  Using low-heat cooking methods, such as baking, instead of high-heat cooking methods, such as deep frying. Work with your dietitian to make sure you understand how to use the Nutrition Facts information on food labels. HOW CAN FOOD AFFECT ME? Carbohydrates Carbohydrates affect your blood glucose level more than any other type of food. Your dietitian will help you determine how many carbohydrates to eat at each meal and teach you how to count carbohydrates. Counting carbohydrates is important to keep your blood glucose at a healthy level, especially if you are using insulin or taking certain medicines for diabetes mellitus. Alcohol Alcohol can cause sudden decreases in blood glucose (hypoglycemia), especially if you use insulin or take certain medicines for diabetes mellitus. Hypoglycemia can be a life-threatening condition. Symptoms of hypoglycemia (sleepiness, dizziness, and disorientation) are similar to symptoms of having too much alcohol.  If your health care provider has given you approval to drink alcohol, do so in moderation and use the following guidelines:  Women should not have more than one drink per day,  and men should not have more than two drinks per day. One drink is equal to:  12 oz of beer.  5 oz of wine.  1 oz of hard liquor.  Do not drink on an empty stomach.  Keep yourself hydrated. Have water, diet soda, or unsweetened iced tea.  Regular soda, juice, and other mixers might contain a lot of carbohydrates and should be counted. WHAT FOODS ARE NOT RECOMMENDED? As you make food choices, it is important to remember that all foods are not the same. Some foods have fewer nutrients per serving than other foods, even though they might have the same number of calories or carbohydrates. It is difficult to get your body what it needs when you eat foods with fewer nutrients. Examples of foods that you should avoid that are high in calories and carbohydrates but low in nutrients include:  Trans fats (most processed foods list trans fats on the Nutrition Facts label).  Regular soda.  Juice.  Candy.  Sweets, such as cake, pie, doughnuts, and cookies.  Fried foods. WHAT FOODS CAN I EAT? Eat nutrient-rich foods, which will nourish your body and  keep you healthy. The food you should eat also will depend on several factors, including:  The calories you need.  The medicines you take.  Your weight.  Your blood glucose level.  Your blood pressure level.  Your cholesterol level. You should eat a variety of foods, including:  Protein.  Lean cuts of meat.  Proteins low in saturated fats, such as fish, egg whites, and beans. Avoid processed meats.  Fruits and vegetables.  Fruits and vegetables that may help control blood glucose levels, such as apples, mangoes, and yams.  Dairy products.  Choose fat-free or low-fat dairy products, such as milk, yogurt, and cheese.  Grains, bread, pasta, and rice.  Choose whole grain products, such as multigrain bread, whole oats, and brown rice. These foods may help control blood pressure.  Fats.  Foods containing healthful fats, such as  nuts, avocado, olive oil, canola oil, and fish. DOES EVERYONE WITH DIABETES MELLITUS HAVE THE SAME MEAL PLAN? Because every person with diabetes mellitus is different, there is not one meal plan that works for everyone. It is very important that you meet with a dietitian who will help you create a meal plan that is just right for you.   This information is not intended to replace advice given to you by your health care provider. Make sure you discuss any questions you have with your health care provider.   Document Released: 12/09/2004 Document Revised: 04/04/2014 Document Reviewed: 02/08/2013 Elsevier Interactive Patient Education 2016 Elsevier Inc.   Fat and Cholesterol Restricted Diet High levels of fat and cholesterol in your blood may lead to various health problems, such as diseases of the heart, blood vessels, gallbladder, liver, and pancreas. Fats are concentrated sources of energy that come in various forms. Certain types of fat, including saturated fat, may be harmful in excess. Cholesterol is a substance needed by your body in small amounts. Your body makes all the cholesterol it needs. Excess cholesterol comes from the food you eat. When you have high levels of cholesterol and saturated fat in your blood, health problems can develop because the excess fat and cholesterol will gather along the walls of your blood vessels, causing them to narrow. Choosing the right foods will help you control your intake of fat and cholesterol. This will help keep the levels of these substances in your blood within normal limits and reduce your risk of disease. WHAT IS MY PLAN? Your health care provider recommends that you:  Get no more than __________ % of the total calories in your daily diet from fat.  Limit your intake of saturated fat to less than ______% of your total calories each day.  Limit the amount of cholesterol in your diet to less than _________mg per day. WHAT TYPES OF FAT SHOULD I  CHOOSE?  Choose healthy fats more often. Choose monounsaturated and polyunsaturated fats, such as olive and canola oil, flaxseeds, walnuts, almonds, and seeds.  Eat more omega-3 fats. Good choices include salmon, mackerel, sardines, tuna, flaxseed oil, and ground flaxseeds. Aim to eat fish at least two times a week.  Limit saturated fats. Saturated fats are primarily found in animal products, such as meats, butter, and cream. Plant sources of saturated fats include palm oil, palm kernel oil, and coconut oil.  Avoid foods with partially hydrogenated oils in them. These contain trans fats. Examples of foods that contain trans fats are stick margarine, some tub margarines, cookies, crackers, and other baked goods. WHAT GENERAL GUIDELINES DO I NEED TO FOLLOW?  These guidelines for healthy eating will help you control your intake of fat and cholesterol:  Check food labels carefully to identify foods with trans fats or high amounts of saturated fat.  Fill one half of your plate with vegetables and green salads.  Fill one fourth of your plate with whole grains. Look for the word "whole" as the first word in the ingredient list.  Fill one fourth of your plate with lean protein foods.  Limit fruit to two servings a day. Choose fruit instead of juice.  Eat more foods that contain soluble fiber. Examples of foods that contain this type of fiber are apples, broccoli, carrots, beans, peas, and barley. Aim to get 20-30 g of fiber per day.  Eat more home-cooked food and less restaurant, buffet, and fast food.  Limit or avoid alcohol.  Limit foods high in starch and sugar.  Limit fried foods.  Cook foods using methods other than frying. Baking, boiling, grilling, and broiling are all great options.  Lose weight if you are overweight. Losing just 5-10% of your initial body weight can help your overall health and prevent diseases such as diabetes and heart disease. WHAT FOODS CAN I  EAT? Grains Whole grains, such as whole wheat or whole grain breads, crackers, cereals, and pasta. Unsweetened oatmeal, bulgur, barley, quinoa, or brown rice. Corn or whole wheat flour tortillas. Vegetables Fresh or frozen vegetables (raw, steamed, roasted, or grilled). Green salads. Fruits All fresh, canned (in natural juice), or frozen fruits. Meat and Other Protein Products Ground beef (85% or leaner), grass-fed beef, or beef trimmed of fat. Skinless chicken or [place]. Ground chicken or [place]. Pork trimmed of fat. All fish and seafood. Eggs. Dried beans, peas, or lentils. Unsalted nuts or seeds. Unsalted canned or dry beans. Dairy Low-fat dairy products, such as skim or 1% milk, 2% or reduced-fat cheeses, low-fat ricotta or cottage cheese, or plain low-fat yogurt. Fats and Oils Tub margarines without trans fats. Light or reduced-fat mayonnaise and salad dressings. Avocado. Olive, canola, sesame, or safflower oils. Natural peanut or almond butter (choose ones without added sugar and oil). The items listed above may not be a complete list of recommended foods or beverages. Contact your dietitian for more options. WHAT FOODS ARE NOT RECOMMENDED? Grains White bread. White pasta. White rice. Cornbread. Bagels, pastries, and croissants. Crackers that contain trans fat. Vegetables White potatoes. Corn. Creamed or fried vegetables. Vegetables in a cheese sauce. Fruits Dried fruits. Canned fruit in light or heavy syrup. Fruit juice. Meat and Other Protein Products Fatty cuts of meat. Ribs, chicken wings, bacon, sausage, bologna, salami, chitterlings, fatback, hot dogs, bratwurst, and packaged luncheon meats. Liver and organ meats. Dairy Whole or 2% milk, cream, half-and-half, and cream cheese. Whole milk cheeses. Whole-fat or sweetened yogurt. Full-fat cheeses. Nondairy creamers and whipped toppings. Processed cheese, cheese spreads, or cheese curds. Sweets and Desserts Corn syrup, sugars,  honey, and molasses. Candy. Jam and jelly. Syrup. Sweetened cereals. Cookies, pies, cakes, donuts, muffins, and ice cream. Fats and Oils Butter, stick margarine, lard, shortening, ghee, or bacon fat. Coconut, palm kernel, or palm oils. Beverages Alcohol. Sweetened drinks (such as sodas, lemonade, and fruit drinks or punches). The items listed above may not be a complete list of foods and beverages to avoid. Contact your dietitian for more information.   This information is not intended to replace advice given to you by your health care provider. Make sure you discuss any questions you have with your health care provider.  Document Released: 03/14/2005 Document Revised: 04/04/2014 Document Reviewed: 06/12/2013 Elsevier Interactive Patient Education [employer]2016 Elsevier Inc.

## 2015-06-14 NOTE — Discharge Summary (Signed)
Discharge Summary    Patient ID: Richard [name],  MRN: [id number], DOB/AGE: November 28, 1945 70 y.o.  Admit date: 06/12/2015 Discharge date: 06/14/2015  Primary Care Provider: Alysia [name] Primary Cardiologist: Richard Ryder, MD   Discharge Diagnoses    Principal Problem:   Acute STEMI, inferolateral wall, initial episode of care [name])  **s/p PCI/DES of the OM1 this admission.  Active Problems:   Type 2 diabetes mellitus with peripheral neuropathy (HCC)   Hypertensive heart disease   ICH (intracerebral hemorrhage) (HCC)   Hyperlipidemia   Ischemic Cardiomyopathy  **EF 45-50%.  Allergies No Known Allergies  Diagnostic Studies/Procedures    Cardiac Catheterization and Percutaneous Coronary Intervention 3.17.2017  Coronary Findings     Dominance: Co-dominant    Left Anterior Descending   . Prox LAD to Mid LAD lesion, 50% stenosed. The lesion is type C Calcified eccentric.      Left Circumflex   . First Obtuse Marginal Branch   . 1st Mrg lesion, 100% stenosed. **The OM1 was successfully stented using a 3.0 x 16 mm Promus Premier DES.   . Second Obtuse Marginal Branch   The vessel is small in size.   . Third Obtuse Marginal Branch   The vessel is small in size.      Right Coronary Artery   . Prox RCA to Dist RCA lesion, 40% stenosed. Diffuse.     Left Ventricle The left ventricular ejection fraction is 45-50% by visual estimate. _____________   History of Present Illness     70 y/o ? with a prior h/o HTN, ETOH abuse, and left frontal intracranial hemorrhage in 11/2014. He has recovered well from that event and has been followed closely by neurology as an outpt. He was in his usoh until the morning of 3/17, when he awoke suddenly with severe bilateral shoulder pain that moved to the center of his chest. After 7 hrs of ongoing c/p, his wife called EMS, and he was found to have inferolateral ST elevation with anterior ST dep/TWI. A Code STEMI was activated and he was taken  to [name] where following arrival, he suffered VF and required defibrillation x 1, and a brief period of CPR. He was taken to the cath lab for emergent evaluation.   [name] Course     Consultants: Neurology  [name] underwent emergent diagnostic cardiac catheterization, which revealed an occluded OM1 and otherwise non-obstructive CAD.  The OM1 was successfully stented using a 3.0 x 16 mm Promus Premier drug eluting stent.  EF was 45-50%.  Given his history of intracranial hemorrhage in 11/2014, neurology was consulted immediately post-procedure.  They recommended avoidance of prolonged intravenous heparinization and adequate blood pressure control.    [name] was monitored in the coronary intensive care unit where he ruled in, eventually peaking his troponin at 7.39.  He was maintained on ASA,  blocker, acei, plavix, and high potency statin therapy.  He had no further chest pain or arrhythmia and was transferred out to the floor on 3/18.  He did have a rise in his creatinine from 1.12 on admission to 1.35 on 3/18.  Creat improved this AM @ 1.28.  He has since been seen by cardiac rehab and has been ambulating without recurrent symptoms or limitations.  He will be discharged home today in good condition and we will arrange for follow-up in our office in one week.  At the recommendation of interventional cardiology, he will remain on ASA and plavix for six months. _____________  Discharge Vitals Blood pressure 115/58, pulse 77, temperature 98.9 F (37.2 C), temperature source Oral, resp. rate 20, height 6' (1.829 m), weight 211 lb 6.4 oz (95.89 kg), SpO2 98 %.  Filed Weights   06/12/15 1645 06/13/15 1022 06/14/15 0624  Weight: 206 lb 5.6 oz (93.6 kg) 210 lb 1.6 oz (95.3 kg) 211 lb 6.4 oz (95.89 kg)    Labs & Radiologic Studies     CBC  Recent Labs  06/12/15 1314 06/13/15 0445 06/14/15 0441  WBC 13.8* 13.2* 12.6*  NEUTROABS 12.0*  --   --   HGB 14.2 13.2 12.4*  HCT 43.0 40.6  37.9*  MCV 87.2 86.8 86.3  PLT 249 226 215   Basic Metabolic Panel  Recent Labs  06/13/15 1025 06/14/15 1008  NA 137 136  K 3.7 3.8  CL 98* 99*  CO2 28 26  GLUCOSE 239* 264*  BUN 17 24*  CREATININE 1.35* 1.28*  CALCIUM 9.1 8.8*   Liver Function Tests  Recent Labs  06/12/15 1314 06/13/15 0445  AST 23 40  ALT 12* 14*  ALKPHOS 74 63  BILITOT 0.8 1.0  PROT 7.1 6.8  ALBUMIN 3.8 3.4*   Cardiac Enzymes  Recent Labs  06/13/15 2243 06/14/15 0441 06/14/15 1008  TROPONINI 3.40* 3.81* 2.67*   Hemoglobin A1C  Recent Labs  06/12/15 1314  HGBA1C 7.5*   Fasting Lipid Panel  Recent Labs  06/12/15 1314  CHOL 288*  HDL 42  LDLCALC 236*  TRIG 52  CHOLHDL 6.9   Disposition   Pt is being discharged home today in good condition.  Follow-up Plans & Appointments    Follow-up Information    Follow up with [name] In 1 week.   Why:  We will contact you for a follow-up appointment.   Contact information:   [address] [place] [address] [phone]     Discharge Instructions    Amb Referral to Cardiac Rehabilitation    Complete by:  As directed   Diagnosis:   Myocardial Infarction PCI             Discharge Medications   Current Discharge Medication List    START taking these medications   Details  atorvastatin (LIPITOR) 80 MG tablet Take 1 tablet (80 mg total) by mouth daily at 6 PM. Qty: 30 tablet, Refills: 6    carvedilol (COREG) 6.25 MG tablet Take 1 tablet (6.25 mg total) by mouth 2 (two) times daily with a meal. Qty: 60 tablet, Refills: 6    clopidogrel (PLAVIX) 75 MG tablet Take 1 tablet (75 mg total) by mouth daily with breakfast. Qty: 30 tablet, Refills: 6    lisinopril (PRINIVIL,ZESTRIL) 5 MG tablet Take 1 tablet (5 mg total) by mouth daily. Qty: 30 tablet, Refills: 6      CONTINUE these medications which have NOT CHANGED   Details  amLODipine  (NORVASC) 10 MG tablet Take 1 tablet (10 mg total) by mouth daily. Qty: 30 tablet, Refills: 1    aspirin EC 81 MG tablet Take 81 mg by mouth daily.    insulin NPH Human (HUMULIN N,NOVOLIN N) 100 UNIT/ML injection Inject into the skin. Takes 40 units in the am and 35 units in the pm, RN from [place] [name] call pharmacy its a different brand name, patient takes this brand t/c call made 05/13/2015    insulin regular (NOVOLIN R,HUMULIN R) 100 units/mL injection Inject into the skin 3 (three) times daily before  meals. Inject 3 units at dinner    Multiple Vitamin (MULTIVITAMIN WITH MINERALS) TABS tablet Take 1 tablet by mouth daily.    vitamin B-12 100 MCG tablet Take 1 tablet (100 mcg total) by mouth daily. Qty: 30 tablet, Refills: 1      STOP taking these medications     olmesartan-hydrochlorothiazide (BENICAR HCT) 40-25 MG tablet         Aspirin prescribed at discharge?  Yes High Intensity Statin Prescribed? (Lipitor 40-80mg  or Crestor 20-40mg ): Yes Beta Blocker Prescribed? Yes For EF 45% or less, Was ACEI/ARB Prescribed? Yes ADP Receptor Inhibitor Prescribed? (i.e. Plavix etc.-Includes Medically Managed Patients): Yes For EF <40%, Aldosterone Inhibitor Prescribed? No: N/A Was EF assessed during THIS hospitalization? Yes Was Cardiac Rehab II ordered? (Included Medically managed Patients): Yes   Outstanding Labs/Studies   Follow-up lipids/lft's in 8 wks.  Duration of Discharge Encounter   Greater than 30 minutes including physician time.  Signed, Nicolasa Duckinghristopher Jadarius Commons NP 06/14/2015, 11:49 AM

## 2015-06-15 ENCOUNTER — Telehealth: Payer: Self-pay | Admitting: Interventional Cardiology

## 2015-06-15 MED FILL — Lidocaine HCl Local Preservative Free (PF) Inj 1%: INTRAMUSCULAR | Qty: 30 | Status: AC

## 2015-06-15 MED FILL — Heparin Sodium (Porcine) 2 Unit/ML in Sodium Chloride 0.9%: INTRAMUSCULAR | Qty: 1000 | Status: AC

## 2015-06-15 NOTE — Telephone Encounter (Signed)
[facility]   Patient understands to follow up with provider ? 3/27 with Lenze @ 9:15am; asked to arrive 15 mins. Early. Pt stated he may need to reschedule due to his ride, son, may have to work on Monday. Pt reminded of cancellation/change fee and to call the office before 5pm on Friday as no one is here on the weekends. Pt verbalized understanding.   Patient understands discharge instructions? Yes   Patient understands medications and regiment? Yes  Patient understands to bring all medications to this visit? Yes  Fill in details here

## 2015-06-15 NOTE — Telephone Encounter (Signed)
TCM per Thayer Ohmhris  3/27 @ 145 pm w/ Marcelino DusterMichelle

## 2015-06-22 ENCOUNTER — Encounter: Payer: Self-pay | Admitting: Physician Assistant

## 2015-06-22 ENCOUNTER — Ambulatory Visit (INDEPENDENT_AMBULATORY_CARE_PROVIDER_SITE_OTHER): Payer: PPO | Admitting: Physician Assistant

## 2015-06-22 VITALS — BP 104/60 | HR 65 | Ht 71.0 in | Wt 215.0 lb

## 2015-06-22 DIAGNOSIS — I10 Essential (primary) hypertension: Secondary | ICD-10-CM

## 2015-06-22 DIAGNOSIS — E785 Hyperlipidemia, unspecified: Secondary | ICD-10-CM | POA: Diagnosis not present

## 2015-06-22 DIAGNOSIS — I213 ST elevation (STEMI) myocardial infarction of unspecified site: Secondary | ICD-10-CM

## 2015-06-22 DIAGNOSIS — I2510 Atherosclerotic heart disease of native coronary artery without angina pectoris: Secondary | ICD-10-CM | POA: Insufficient documentation

## 2015-06-22 LAB — BASIC METABOLIC PANEL
BUN: 15 mg/dL (ref 7–25)
CO2: 30 mmol/L (ref 20–31)
CREATININE: 1.15 mg/dL (ref 0.70–1.25)
Calcium: 9.2 mg/dL (ref 8.6–10.3)
Chloride: 97 mmol/L — ABNORMAL LOW (ref 98–110)
Glucose, Bld: 150 mg/dL — ABNORMAL HIGH (ref 65–99)
Potassium: 4.4 mmol/L (ref 3.5–5.3)
Sodium: 137 mmol/L (ref 135–146)

## 2015-06-22 MED ORDER — NITROGLYCERIN 0.4 MG SL SUBL: 0.4000 mg | SUBLINGUAL_TABLET | SUBLINGUAL | Status: AC | PRN

## 2015-06-22 NOTE — Assessment & Plan Note (Addendum)
Patient is doing well after recent MI. His blood pressure is on the low side because he's been taking 2 different ace inhibitors. I can't titrate the Coreg up at this time. Stop lisinopril. Continue Benicar HCTZ. Start cardiac rehabilitation next week. Because of recent hemorrhagic stroke he is followed closely by neurology continue Plavix and aspirin for at least 6 months. Follow-up with Dr. Katrinka BlazingSmith in 6 weeks.

## 2015-06-22 NOTE — Assessment & Plan Note (Signed)
Followed closely by neurology. On Plavix and aspirin for 6 week after recent MI.

## 2015-06-22 NOTE — Patient Instructions (Addendum)
Medication Instructions:    STOP LISINOPRIL     STAR TAKING NITROGLYCERIN AS NEEDED FOR CHEST PAIN    If you need a refill on your cardiac medications before your next appointment, please call your pharmacy.  Labwork: BMET TODAY   RETURN FOR  FASTING LFT AND LIPIDS IN 6 WEEKS    Testing/Procedures:  NONE ORDER TODAY    Follow-Up: WITH DR Katrinka BlazingSMITH IN 6 TO 8 WEEKS    Any Other Special Instructions Will Be Listed Below (If Applicable).  Nitroglycerin sublingual tablets What is this medicine? NITROGLYCERIN (nye troe GLI ser in) is a type of vasodilator. It relaxes blood vessels, increasing the blood and oxygen supply to your heart. This medicine is used to relieve chest pain caused by angina. It is also used to prevent chest pain before activities like climbing stairs, going outdoors in cold weather, or sexual activity. This medicine may be used for other purposes; ask your health care provider or pharmacist if you have questions. What should I tell my health care provider before I take this medicine? They need to know if you have any of these conditions: -anemia -head injury, recent stroke, or bleeding in the brain -liver disease -previous heart attack -an unusual or allergic reaction to nitroglycerin, other medicines, foods, dyes, or preservatives -pregnant or trying to get pregnant -breast-feeding How should I use this medicine? Take this medicine by mouth as needed. At the first sign of an angina attack (chest pain or tightness) place one tablet under your tongue. You can also take this medicine 5 to 10 minutes before an event likely to produce chest pain. Follow the directions on the prescription label. Let the tablet dissolve under the tongue. Do not swallow whole. Replace the dose if you accidentally swallow it. It will help if your mouth is not dry. Saliva around the tablet will help it to dissolve more quickly. Do not eat or drink, smoke or chew tobacco while a tablet is  dissolving. If you are not better within 5 minutes after taking ONE dose of nitroglycerin, call 9-1-1 immediately to seek emergency medical care. Do not take more than 3 nitroglycerin tablets over 15 minutes. If you take this medicine often to relieve symptoms of angina, your doctor or health care professional may provide you with different instructions to manage your symptoms. If symptoms do not go away after following these instructions, it is important to call 9-1-1 immediately. Do not take more than 3 nitroglycerin tablets over 15 minutes. Talk to your pediatrician regarding the use of this medicine in children. Special care may be needed. Overdosage: If you think you have taken too much of this medicine contact a poison control center or emergency room at once. NOTE: This medicine is only for you. Do not share this medicine with others. What if I miss a dose? This does not apply. This medicine is only used as needed. What may interact with this medicine? Do not take this medicine with any of the following medications: -certain migraine medicines like ergotamine and dihydroergotamine (DHE) -medicines used to treat erectile dysfunction like sildenafil, tadalafil, and vardenafil -riociguat This medicine may also interact with the following medications: -alteplase -aspirin -heparin -medicines for high blood pressure -medicines for mental depression -other medicines used to treat angina -phenothiazines like chlorpromazine, mesoridazine, prochlorperazine, thioridazine This list may not describe all possible interactions. Give your health care provider a list of all the medicines, herbs, non-prescription drugs, or dietary supplements you use. Also tell them if you  smoke, drink alcohol, or use illegal drugs. Some items may interact with your medicine. What should I watch for while using this medicine? Tell your doctor or health care professional if you feel your medicine is no longer  working. Keep this medicine with you at all times. Sit or lie down when you take your medicine to prevent falling if you feel dizzy or faint after using it. Try to remain calm. This will help you to feel better faster. If you feel dizzy, take several deep breaths and lie down with your feet propped up, or bend forward with your head resting between your knees. You may get drowsy or dizzy. Do not drive, use machinery, or do anything that needs mental alertness until you know how this drug affects you. Do not stand or sit up quickly, especially if you are an older patient. This reduces the risk of dizzy or fainting spells. Alcohol can make you more drowsy and dizzy. Avoid alcoholic drinks. Do not treat yourself for coughs, colds, or pain while you are taking this medicine without asking your doctor or health care professional for advice. Some ingredients may increase your blood pressure. What side effects may I notice from receiving this medicine? Side effects that you should report to your doctor or health care professional as soon as possible: -blurred vision -dry mouth -skin rash -sweating -the feeling of extreme pressure in the head -unusually weak or tired Side effects that usually do not require medical attention (report to your doctor or health care professional if they continue or are bothersome): -flushing of the face or neck -headache -irregular heartbeat, palpitations -nausea, vomiting This list may not describe all possible side effects. Call your doctor for medical advice about side effects. You may report side effects to FDA at 1-800-FDA-1088. Where should I keep my medicine? Keep out of the reach of children. Store at room temperature between 20 and 25 degrees C (68 and 77 degrees F). Store in Retail buyer. Protect from light and moisture. Keep tightly closed. Throw away any unused medicine after the expiration date. NOTE: This sheet is a summary. It may not cover all possible  information. If you have questions about this medicine, talk to your doctor, pharmacist, or health care provider.    2016, Elsevier/Gold Standard. (2013-01-10 17:57:36)

## 2015-06-22 NOTE — Assessment & Plan Note (Signed)
Blood pressure on the low side. Stop lisinopril check renal function

## 2015-06-22 NOTE — Assessment & Plan Note (Signed)
Patient is back on Lipitor. Check fasting lipid panel and LFTs in 6 weeks.

## 2015-06-22 NOTE — Progress Notes (Signed)
Cardiology Office Note   Date:  06/22/2015   ID:  [name], DOB [birth year], MRN [id number]  PCP:  Alysia Penna, MD  Cardiologist: Dr. Verdis Prime  Chief Complaint: Post hospital follow-up    History of Present Illness: Richard [name] is a 70 y.o. male who presents for post hospital follow-up. He is a 70 y/o  with a prior h/o HTN, ETOH abuse, and left frontal intracranial hemorrhage in 11/2014. He has recovered well from that event and has been followed closely by neurology as an outpt.  Patient was admitted to the hospital with a STEMI and upon arrival he suffered V. fib requiring defibrillation 1 in brief period of CPR. He underwent emergency cardiac catheterization and underwent successful stenting to the OM1 with a drug-eluting stent. EF 45-50%. Given his history of intracranial hemorrhage in 11/2014 neurology recommended avoidance of prolonged intravenous heparinization and adequate blood pressure control. Troponins peaked at 7.39. Creatinine went up to 1.35 but was 1.28 at discharge. Plan was to keep him on Plavix and aspirin for 6 weeks.  Patient comes in today without cardiac complaints. He was taking Benicar HCTZ prior to admission and is taking that as well as the lisinopril. His blood pressures on the low side. He is exercising 30 minutes daily without difficulty. He start cardiac rehabilitation next week. He denies any chest pain, palpitations, dyspnea, dyspnea on exertion, dizziness or presyncope.   Past Medical History  Diagnosis Date  . Diabetes mellitus without complication (HCC)   . Stroke (HCC)   . Left eye injury   . Intracranial hemorrhage (HCC)     a. 11/2014 in setting of marked HTN->3.5x2.5 left frontal ICH (12-73ml by MRI); b. 11/2014 MRA no evidence of AVM or CAA; c. 11/2014 Carotid U/S: unremarkable.  . Hypertensive heart disease   . ETOH abuse   . Diastolic dysfunction     a. 11/2014 Echo: EF 60-65%, gr1 DD, mildly dil RA.  Marland Kitchen CAD (coronary artery disease)    a. 05/2015 Inflat STEMI/PCI: LM nl, LAD 50p, Om1 100 (3.0 x 16mm Promus Premier DES), OM2/3 small, RCA 40p/d, EF 45-50%.  . H/O ventricular fibrillation     a. 05/2015 in setting of inferolateral STEMI.    Past Surgical History  Procedure Laterality Date  . Cardiac catheterization N/A 06/12/2015    Procedure: Left Heart Cath and Coronary Angiography;  Surgeon: Lyn Records, MD;  Location: [facility] INVASIVE CV LAB;  Service: Cardiovascular;  Laterality: N/A;     Current Outpatient Prescriptions  Medication Sig Dispense Refill  . amLODipine (NORVASC) 10 MG tablet Take 1 tablet (10 mg total) by mouth daily. 30 tablet 1  . aspirin EC 81 MG tablet Take 81 mg by mouth daily.    Marland Kitchen atorvastatin (LIPITOR) 80 MG tablet Take 1 tablet (80 mg total) by mouth daily at 6 PM. 30 tablet 6  . carvedilol (COREG) 6.25 MG tablet Take 1 tablet (6.25 mg total) by mouth 2 (two) times daily with a meal. 60 tablet 6  . clopidogrel (PLAVIX) 75 MG tablet Take 1 tablet (75 mg total) by mouth daily with breakfast. 30 tablet 6  . insulin NPH Human (HUMULIN N,NOVOLIN N) 100 UNIT/ML injection Inject into the skin. Takes 40 units in the am and 35 units in the pm, RN from [facility] call pharmacy its a different brand name, patient takes this brand t/c call made 05/13/2015    . insulin regular (NOVOLIN R,HUMULIN R) 100 units/mL injection Inject into the skin 3 (three)  times daily before meals. Inject 3 units at dinner    . Multiple Vitamin (MULTIVITAMIN WITH MINERALS) TABS tablet Take 1 tablet by mouth daily.    Marland Kitchen olmesartan-hydrochlorothiazide (BENICAR HCT) 40-25 MG tablet Take 1 tablet by mouth daily.    . nitroGLYCERIN (NITROSTAT) 0.4 MG SL tablet Place 1 tablet (0.4 mg total) under the tongue every 5 (five) minutes as needed for chest pain. 25 tablet 3   No current facility-administered medications for this visit.    Allergies:   Review of patient's allergies indicates no known allergies.    Social History:  The patient  reports  that he has never smoked. He does not have any smokeless tobacco history on file. He reports that he drinks about 0.6 oz of alcohol per week.   Family History:  The patient's    family history includes Stroke in his maternal grandmother.    ROS:  Please see the history of present illness.   Otherwise, review of systems are positive for none.   All other systems are reviewed and negative.    PHYSICAL EXAM: VS:  BP 104/60 mmHg  Pulse 65  Ht  (1.803 m)  Wt 215 lb (97.523 kg)  BMI 30.00 kg/m2 , BMI Body mass index is 30 kg/(m^2). GEN: Well nourished, well developed, in no acute distress Neck: no JVD, HJR, carotid bruits, or masses Cardiac: RRR; no murmurs,gallop, rubs, thrill or heave,  Respiratory:  clear to auscultation bilaterally, normal work of breathing GI: soft, nontender, nondistended, + BS MS: no deformity or atrophy Extremities: Right groin without hematoma or hemorrhage at cath site without cyanosis, clubbing, edema, good distal pulses bilaterally.  Skin: warm and dry, no rash Neuro:  Strength and sensation are intact    EKG:  EKG is ordered today. The ekg ordered today demonstrates normal sinus rhythm with inferior lateral T-wave inversion, no significant change from EKG in the hospital.   Recent Labs: 12/11/2014: TSH 1.983 06/12/2015: B Natriuretic Peptide 17.6 06/13/2015: ALT 14* 06/14/2015: BUN 24*; Creatinine, Ser 1.28*; Hemoglobin 12.4*; Platelets 215; Potassium 3.8; Sodium 136    Lipid Panel    Component Value Date/Time   CHOL 288* 06/12/2015 1314   TRIG 52 06/12/2015 1314   HDL 42 06/12/2015 1314   CHOLHDL 6.9 06/12/2015 1314   VLDL 10 06/12/2015 1314   LDLCALC 236* 06/12/2015 1314      Wt Readings from Last 3 Encounters:  06/22/15 215 lb (97.523 kg)  06/14/15 211 lb 6.4 oz (95.89 kg)  05/12/15 215 lb 6.4 oz (97.705 kg)      Other studies Reviewed: Additional studies/ records that were reviewed today include and review of the records  demonstrates:   Procedures     Left Heart Cath and Coronary Angiography     Conclusion     1. Prox RCA to Dist RCA lesion, 40% stenosed. 2. 1st Mrg lesion, 100% stenosed. 3. Prox LAD to Mid LAD lesion, 50% stenosed.    Acute inferolateral myocardial infarction due to occlusion of a very large first obtuse marginal in the mid segment.  Moderate proximal to mid LAD disease less than 60% obstructive. Luminal irregularities throughout the proximal and mid right coronary.  Mid inferior wall akinesis. EF 40-50%.  Successful angioplasty followed by stenting of the totally occluded obtuse marginal bifurcation stenosis with implantation of a single drug-eluting stent across the smaller branch reducing 100% stenosis to 0% with TIMI grade 3 flow. Final post dilation balloon diameter 3.25 mm. Angioplasty was performed  on the smaller obtuse marginal branch prior to stenting.  RECOMMENDATIONS:     Given history of relatively recent intracerebral hemorrhage, Plavix was administered, 600 mg.    Cangrelor IV bolus and infusion for 2 hours will be used.  Intravenous heparin was discontinued.  Frequent neurological screening for development of headache or other evidence of intracranial bleeding.  Aspirin and Plavix 6 months.              ASSESSMENT AND PLAN:  CAD (coronary artery disease) Patient is doing well after recent MI. His blood pressure is on the low side because he's been taking 2 different ace inhibitors. I can't titrate the Coreg up at this time. Stop lisinopril. Continue Benicar HCTZ. Start cardiac rehabilitation next week. Because of recent hemorrhagic stroke he is followed closely by neurology continue Plavix and aspirin for at least 6 months. Follow-up with Dr. Katrinka BlazingSmith in 6 weeks.  Essential hypertension Blood pressure on the low side. Stop lisinopril check renal function  Hyperlipidemia Patient is back on Lipitor. Check fasting lipid panel and LFTs in 6 weeks.  ICH  (intracerebral hemorrhage) (HCC) Followed closely by neurology. On Plavix and aspirin for 6 week after recent MI.    Elson ClanSigned, Bambie Pizzolato, PA-C  06/22/2015 1:44 PM    [facility] [address], [place], [place]  [address] Phone: [phone]; Fax: [phone]

## 2015-06-30 ENCOUNTER — Ambulatory Visit (HOSPITAL_COMMUNITY): Payer: PPO

## 2015-07-08 ENCOUNTER — Encounter (HOSPITAL_COMMUNITY): Payer: PPO

## 2015-07-10 ENCOUNTER — Encounter (HOSPITAL_COMMUNITY): Payer: PPO

## 2015-07-15 ENCOUNTER — Encounter (HOSPITAL_COMMUNITY): Payer: PPO

## 2015-07-17 ENCOUNTER — Encounter (HOSPITAL_COMMUNITY): Payer: PPO

## 2015-07-21 ENCOUNTER — Telehealth (HOSPITAL_COMMUNITY): Payer: Self-pay | Admitting: Cardiac Rehabilitation

## 2015-07-21 ENCOUNTER — Inpatient Hospital Stay (HOSPITAL_COMMUNITY): Admission: RE | Admit: 2015-07-21 | Payer: PPO | Source: Ambulatory Visit

## 2015-07-21 NOTE — Telephone Encounter (Signed)
pc to pt to assess reason for absence from cardiac rehab orientation. Pt states he forgot appointment.  Pt rescheduled to first available, Tuesday 08/25/15 @ 1:30.  Pt requests mail and phone reminder of next appointment.

## 2015-07-22 ENCOUNTER — Encounter (HOSPITAL_COMMUNITY): Payer: PPO

## 2015-07-24 ENCOUNTER — Encounter (HOSPITAL_COMMUNITY): Payer: PPO

## 2015-07-27 DIAGNOSIS — I69322 Dysarthria following cerebral infarction: Secondary | ICD-10-CM | POA: Diagnosis not present

## 2015-07-27 DIAGNOSIS — Z794 Long term (current) use of insulin: Secondary | ICD-10-CM | POA: Diagnosis not present

## 2015-07-27 DIAGNOSIS — I69319 Unspecified symptoms and signs involving cognitive functions following cerebral infarction: Secondary | ICD-10-CM | POA: Diagnosis not present

## 2015-07-27 DIAGNOSIS — E109 Type 1 diabetes mellitus without complications: Secondary | ICD-10-CM | POA: Diagnosis not present

## 2015-07-27 DIAGNOSIS — I10 Essential (primary) hypertension: Secondary | ICD-10-CM | POA: Diagnosis not present

## 2015-07-27 DIAGNOSIS — I213 ST elevation (STEMI) myocardial infarction of unspecified site: Secondary | ICD-10-CM | POA: Diagnosis not present

## 2015-07-27 DIAGNOSIS — Z1389 Encounter for screening for other disorder: Secondary | ICD-10-CM | POA: Diagnosis not present

## 2015-07-27 DIAGNOSIS — E7800 Pure hypercholesterolemia, unspecified: Secondary | ICD-10-CM | POA: Diagnosis not present

## 2015-07-29 ENCOUNTER — Encounter (HOSPITAL_COMMUNITY): Payer: PPO

## 2015-07-31 ENCOUNTER — Encounter (HOSPITAL_COMMUNITY): Payer: PPO

## 2015-08-04 ENCOUNTER — Other Ambulatory Visit: Payer: PPO

## 2015-08-05 ENCOUNTER — Encounter (HOSPITAL_COMMUNITY): Payer: PPO

## 2015-08-07 ENCOUNTER — Encounter (HOSPITAL_COMMUNITY): Payer: PPO

## 2015-08-11 ENCOUNTER — Encounter: Payer: Self-pay | Admitting: Adult Health

## 2015-08-11 ENCOUNTER — Ambulatory Visit (INDEPENDENT_AMBULATORY_CARE_PROVIDER_SITE_OTHER): Payer: PPO | Admitting: Adult Health

## 2015-08-11 VITALS — BP 102/69 | HR 69 | Ht 71.0 in | Wt 210.5 lb

## 2015-08-11 DIAGNOSIS — I252 Old myocardial infarction: Secondary | ICD-10-CM | POA: Diagnosis not present

## 2015-08-11 DIAGNOSIS — Z8679 Personal history of other diseases of the circulatory system: Secondary | ICD-10-CM

## 2015-08-11 NOTE — Patient Instructions (Signed)
Aspirin 81 mg daily  strict control of hypertension with blood pressure goal below 130/90,  diabetes with hemoglobin A1c goal below 6.5%  lipids with LDL cholesterol goal below 70 mg/dL If your symptoms worsen or you develop new symptoms please let us know.

## 2015-08-11 NOTE — Progress Notes (Signed)
PATIENT: Richard [name] DOB: 1945-07-30  REASON FOR VISIT: follow up HISTORY FROM: patient  HISTORY OF PRESENT ILLNESS:  Today 08/11/2015: [name] is a 70 year old male with a history of intracranial hemorrhage in September 2016. He returns today for follow-up. The patient is currently on aspirin. He was recently started on Plavix following a myocardial infarction. He states that he is tolerating these medications well. His blood pressure is in normal range today. His cardiologist is managing his hypertension. His LDL was elevated in March however he is on Lipitor. He has a follow-up appointment with his cardiologist on Friday. He states that since the stroke he continues to have discomfort in the shoulders greater on the right. He states that the symptoms were not present until after his stroke. The patient has not tried taking any medication for this discomfort. He denies any new strokelike symptoms. He had a repeat MRI in March that showed expected changes. He returns today for an evaluation.  HISTORY 05/12/15 (Richard [name]): 35 year African-American male seen today for the first office follow-up visit following intracerebral hemorrhage in September 2016 Richard [name] is a 70 y.o. male, right handed, with a past medical history significant for DM, stroke without residual deficits, s/p artificial left eye, brought in for evaluation of the aforementioned symptoms/signs.  Patient `s son at the bedside indicated that his father was last seen normal around 11 pm Night prior to admission, but when he woke up Next morning he was not able to speak fluently and he noticed weakness of the right side of his face, and thus he became concerned and brought patient to the ED. Upon arrival to the ED, patient was described as awake and alert but aphasic with right face weakness and SBP>200. Urgent CT brain was obtained, personally reviewed, and showed evidence of a lobar left frontal ICH measuring 3.5 x 2.5 cm  transverse, with an estimated ICH volume of approximately of 17.5 ml. There was mild surrounding vasogenic edema but no IV extension or hydrocephalus.  He was started on nicardipine drip as his BP remains >190. Serologies reviewed: PTT 27, INR 1.02, platelets 238. He was admitted to the intensive care unit with white blood pressure control, close neurological monitoring and placed on alcohol withdrawal precautions. Follow-up CT scan showed stable appearance of the hematoma without any extension normal expansion. Carotid Doppler showed no significant expectoration stenosis. Transthoracic echo showed normal ejection fraction. LDL cholesterol was 102 mg percent and hemoglobin A1c was 7.5. Patient did well and was transferred to the floor and subsequently to inpatient rehabilitation. He has done well and states his made near complete neurological recovery. He still has some diminished fine motor skills in his right hand as well as his right leg tires easily. He also has some occasional word finding difficulties particularly when he gets excited and tries to talk fast. He has finished inpatient rehabilitation as well as home therapies and is currently participating in outpatient speech therapy. He recently finished outpatient physical and occupational therapies. He states his blood pressure is well controlled and today it is 141/87. He has no new complaints today.  REVIEW OF SYSTEMS: Out of a complete 14 system review of symptoms, the patient complains only of the following symptoms, and all other reviewed systems are negative.  Loss of vision, joint pain, aching muscles, numbness, speech difficulty  ALLERGIES: No Known Allergies  HOME MEDICATIONS: Outpatient Prescriptions Prior to Visit  Medication Sig Dispense Refill  . amLODipine (NORVASC) 10 MG tablet Take  1 tablet (10 mg total) by mouth daily. 30 tablet 1  . aspirin EC 81 MG tablet Take 81 mg by mouth daily.    Marland Kitchen atorvastatin (LIPITOR) 80 MG tablet  Take 1 tablet (80 mg total) by mouth daily at 6 PM. 30 tablet 6  . carvedilol (COREG) 6.25 MG tablet Take 1 tablet (6.25 mg total) by mouth 2 (two) times daily with a meal. 60 tablet 6  . clopidogrel (PLAVIX) 75 MG tablet Take 1 tablet (75 mg total) by mouth daily with breakfast. 30 tablet 6  . insulin NPH Human (HUMULIN N,NOVOLIN N) 100 UNIT/ML injection Inject into the skin. Takes 40 units in the am and 35 units in the pm, RN from [facility] call pharmacy its a different brand name, patient takes this brand t/c call made 05/13/2015    . insulin regular (NOVOLIN R,HUMULIN R) 100 units/mL injection Inject into the skin 3 (three) times daily before meals. Inject 3 units at dinner    . Multiple Vitamin (MULTIVITAMIN WITH MINERALS) TABS tablet Take 1 tablet by mouth daily.    . nitroGLYCERIN (NITROSTAT) 0.4 MG SL tablet Place 1 tablet (0.4 mg total) under the tongue every 5 (five) minutes as needed for chest pain. 25 tablet 3  . olmesartan-hydrochlorothiazide (BENICAR HCT) 40-25 MG tablet Take 1 tablet by mouth daily.     No facility-administered medications prior to visit.    PAST MEDICAL HISTORY: Past Medical History  Diagnosis Date  . Diabetes mellitus without complication (HCC)   . Stroke (HCC)   . Left eye injury   . Intracranial hemorrhage (HCC)     a. 11/2014 in setting of marked HTN->3.5x2.5 left frontal ICH (12-43ml by MRI); b. 11/2014 MRA no evidence of AVM or CAA; c. 11/2014 Carotid U/S: unremarkable.  . Hypertensive heart disease   . ETOH abuse   . Diastolic dysfunction     a. 11/2014 Echo: EF 60-65%, gr1 DD, mildly dil RA.  Marland Kitchen CAD (coronary artery disease)     a. 05/2015 Inflat STEMI/PCI: LM nl, LAD 50p, Om1 100 (3.0 x 16mm Promus Premier DES), OM2/3 small, RCA 40p/d, EF 45-50%.  . H/O ventricular fibrillation     a. 05/2015 in setting of inferolateral STEMI.    PAST SURGICAL HISTORY: Past Surgical History  Procedure Laterality Date  . Cardiac catheterization N/A 06/12/2015    Procedure:  Left Heart Cath and Coronary Angiography;  Surgeon: Lyn Records, MD;  Location: [facility] INVASIVE CV LAB;  Service: Cardiovascular;  Laterality: N/A;    FAMILY HISTORY: Family History  Problem Relation Age of Onset  . Stroke Maternal Grandmother     SOCIAL HISTORY: Social History   Social History  . Marital Status: Married    Spouse Name: N/A  . Number of Children: N/A  . Years of Education: N/A   Occupational History  . Not on file.   Social History Main Topics  . Smoking status: Never Smoker   . Smokeless tobacco: Not on file  . Alcohol Use: No     Comment: prev heavy  . Drug Use: Not on file  . Sexual Activity: Not on file   Other Topics Concern  . Not on file   Social History Narrative   Lives in [place] with wife.      PHYSICAL EXAM  Filed Vitals:   08/11/15 0934  BP: 102/69  Pulse: 69  Height: 5\' 11"  (1.803 m)  Weight: 210 lb 8 oz (95.482 kg)   Body mass index  is 29.37 kg/(m^2).  Generalized: Well developed, in no acute distress   Neurological examination  Mentation: Alert oriented to time, place, history taking. Follows all commands speech and language fluent Cranial nerve II-XII: Right pupil- equal round reactive to light. Extraocular movements were full on the right. Artificial eye on the left.. Facial sensation and strength were normal. Uvula tongue midline. Head turning and shoulder shrug  were normal and symmetric. Motor: The motor testing reveals 5 over 5 strength of all 4 extremities. Good symmetric motor tone is noted throughout.  Sensory: Sensory testing is intact to soft touch on all 4 extremities. No evidence of extinction is noted.  Coordination: Cerebellar testing reveals good finger-nose-finger and heel-to-shin bilaterally.  Gait and station: Gait is normal. Tandem gait is unsteady. Romberg is negative. No drift is seen.  Reflexes: Deep tendon reflexes are symmetric and normal bilaterally.   DIAGNOSTIC DATA (LABS, IMAGING, TESTING) - I  reviewed patient records, labs, notes, testing and imaging myself where available.  Lab Results  Component Value Date   WBC 12.6* 06/14/2015   HGB 12.4* 06/14/2015   HCT 37.9* 06/14/2015   MCV 86.3 06/14/2015   PLT 215 06/14/2015      Component Value Date/Time   NA 137 06/22/2015 1345   K 4.4 06/22/2015 1345   CL 97* 06/22/2015 1345   CO2 30 06/22/2015 1345   GLUCOSE 150* 06/22/2015 1345   BUN 15 06/22/2015 1345   CREATININE 1.15 06/22/2015 1345   CREATININE 1.28* 06/14/2015 1008   CALCIUM 9.2 06/22/2015 1345   PROT 6.8 06/13/2015 0445   ALBUMIN 3.4* 06/13/2015 0445   AST 40 06/13/2015 0445   ALT 14* 06/13/2015 0445   ALKPHOS 63 06/13/2015 0445   BILITOT 1.0 06/13/2015 0445   GFRNONAA 55* 06/14/2015 1008   GFRAA >60 06/14/2015 1008   Lab Results  Component Value Date   CHOL 288* 06/12/2015   HDL 42 06/12/2015   LDLCALC 236* 06/12/2015   TRIG 52 06/12/2015   CHOLHDL 6.9 06/12/2015   Lab Results  Component Value Date   HGBA1C 7.5* 06/12/2015       ASSESSMENT AND PLAN 70 y.o. year old male  has a past medical history of Diabetes mellitus without complication (HCC); Stroke [facility](HCC); Left eye injury; Intracranial hemorrhage (HCC); Hypertensive heart disease; ETOH abuse; Diastolic dysfunction; CAD (coronary artery disease); and H/O ventricular fibrillation. here with:  1. History of intercerebral hemorrhage 2. History of myocardial infarction  The patient will remain on aspirin and Plavix. He should maintain strict control of his blood pressure with goal less than 130/90. Cholesterol LDL less than 70 and hemoglobin A1c less than 6.5%. He has a follow-up appointment with his cardiologist on Friday. He states that he will be starting cardiac rehabilitation at the end of the month. Patient advised that if he develops any strokelike symptoms he should call 911 immediately. He will follow-up in 6 months or sooner if needed.     Butch PennyMegan Broderic Bara, MSN, NP-C 08/11/2015, 10:05  AM [facility] [address], [address] [place], [place] [address] [phone]

## 2015-08-11 NOTE — Progress Notes (Signed)
I have reviewed and agreed above plan. 

## 2015-08-12 ENCOUNTER — Encounter (HOSPITAL_COMMUNITY): Payer: PPO

## 2015-08-14 ENCOUNTER — Ambulatory Visit (INDEPENDENT_AMBULATORY_CARE_PROVIDER_SITE_OTHER): Payer: PPO | Admitting: Interventional Cardiology

## 2015-08-14 ENCOUNTER — Encounter (HOSPITAL_COMMUNITY): Payer: PPO

## 2015-08-14 ENCOUNTER — Encounter: Payer: Self-pay | Admitting: Interventional Cardiology

## 2015-08-14 VITALS — BP 108/64 | HR 77 | Ht 71.0 in | Wt 212.0 lb

## 2015-08-14 DIAGNOSIS — I10 Essential (primary) hypertension: Secondary | ICD-10-CM | POA: Diagnosis not present

## 2015-08-14 DIAGNOSIS — I619 Nontraumatic intracerebral hemorrhage, unspecified: Secondary | ICD-10-CM

## 2015-08-14 DIAGNOSIS — E785 Hyperlipidemia, unspecified: Secondary | ICD-10-CM | POA: Diagnosis not present

## 2015-08-14 DIAGNOSIS — I2510 Atherosclerotic heart disease of native coronary artery without angina pectoris: Secondary | ICD-10-CM

## 2015-08-14 NOTE — Progress Notes (Signed)
Cardiology Office Note    Date:  08/14/2015   ID:  Richard [name], DOB 12-19-1945, MRN [id number]  PCP:  Richard Patella, MD  Cardiologist: Richard Noe, MD   Chief Complaint  Patient presents with  . Coronary Artery Disease    History of Present Illness:  Richard [name] is a 70 y.o. male who has a history of coronary artery disease, inferoposterior acute myocardial infarction in March 2017, history of intracranial hemorrhage 2016, history of EtOH abuse, and history of ventricular fibrillation during inferolateral MI. The patient is also diabetic.  Patient overall doing quite well. He denies angina. He has not had syncope, dyspnea, palpitations, and denies edema. He does feel somewhat more fatigued now than he did prior to the myocardial infarction. His medication regimen has not changed significantly other than the addition of Plavix. He was previously on beta blocker therapy, amlodipine, and ARB/Hctz. He has not needed to use nitroglycerin.    Past Medical History  Diagnosis Date  . Diabetes mellitus without complication (HCC)   . Stroke (HCC)   . Left eye injury   . Intracranial hemorrhage (HCC)     a. 11/2014 in setting of marked HTN->3.5x2.5 left frontal ICH (12-40ml by MRI); b. 11/2014 MRA no evidence of AVM or CAA; c. 11/2014 Carotid U/S: unremarkable.  . Hypertensive heart disease   . ETOH abuse   . Diastolic dysfunction     a. 11/2014 Echo: EF 60-65%, gr1 DD, mildly dil RA.  Marland Kitchen CAD (coronary artery disease)     a. 05/2015 Inflat STEMI/PCI: LM nl, LAD 50p, Om1 100 (3.0 x 16mm Promus Premier DES), OM2/3 small, RCA 40p/d, EF 45-50%.  . H/O ventricular fibrillation     a. 05/2015 in setting of inferolateral STEMI.    Past Surgical History  Procedure Laterality Date  . Cardiac catheterization N/A 06/12/2015    Procedure: Left Heart Cath and Coronary Angiography;  Surgeon: Richard Records, MD;  Location: [facility] INVASIVE CV LAB;  Service: Cardiovascular;  Laterality: N/A;     Current Medications: Outpatient Prescriptions Prior to Visit  Medication Sig Dispense Refill  . amLODipine (NORVASC) 10 MG tablet Take 1 tablet (10 mg total) by mouth daily. 30 tablet 1  . aspirin EC 81 MG tablet Take 81 mg by mouth daily.    Marland Kitchen atorvastatin (LIPITOR) 80 MG tablet Take 1 tablet (80 mg total) by mouth daily at 6 PM. 30 tablet 6  . carvedilol (COREG) 6.25 MG tablet Take 1 tablet (6.25 mg total) by mouth 2 (two) times daily with a meal. 60 tablet 6  . clopidogrel (PLAVIX) 75 MG tablet Take 1 tablet (75 mg total) by mouth daily with breakfast. 30 tablet 6  . insulin NPH Human (HUMULIN N,NOVOLIN N) 100 UNIT/ML injection Inject into the skin. Takes 40 units in the am and 35 units in the pm, RN from [facility] call pharmacy its a different brand name, patient takes this brand t/c call made 05/13/2015    . Multiple Vitamin (MULTIVITAMIN WITH MINERALS) TABS tablet Take 1 tablet by mouth daily.    . nitroGLYCERIN (NITROSTAT) 0.4 MG SL tablet Place 1 tablet (0.4 mg total) under the tongue every 5 (five) minutes as needed for chest pain. 25 tablet 3  . olmesartan-hydrochlorothiazide (BENICAR HCT) 40-25 MG tablet Take 1 tablet by mouth daily.    . insulin regular (NOVOLIN R,HUMULIN R) 100 units/mL injection Inject into the skin 3 (three) times daily before meals. Reported on 08/14/2015  No facility-administered medications prior to visit.     Allergies:   Review of patient's allergies indicates no known allergies.   Social History   Social History  . Marital Status: Married    Spouse Name: N/A  . Number of Children: N/A  . Years of Education: N/A   Social History Main Topics  . Smoking status: Never Smoker   . Smokeless tobacco: Never Used  . Alcohol Use: No     Comment: prev heavy  . Drug Use: None  . Sexual Activity: Not Asked   Other Topics Concern  . None   Social History Narrative   Lives in [place] with wife.     Family History:  The patient's family history includes  Healthy in his mother; Other in his father; Stroke in his maternal grandmother.   ROS:   Please see the history of present illness.    Difficulty with vision in left eye. And fatigue.  All other systems reviewed and are negative.   PHYSICAL EXAM:   VS:  BP 108/64 mmHg  Pulse 77  Ht 5\' 11"  (1.803 m)  Wt 212 lb (96.163 kg)  BMI 29.58 kg/m2   GEN: Well nourished, well developed, in no acute distress HEENT: normal Neck: no JVD, carotid bruits, or masses Cardiac: RRR; no murmurs, rubs, or gallops,no edema  Respiratory:  clear to auscultation bilaterally, normal work of breathing GI: soft, nontender, nondistended, + BS MS: no deformity or atrophy Skin: warm and dry, no rash Neuro:  Alert and Oriented x 3, Strength and sensation are intact Psych: euthymic mood, full affect  Wt Readings from Last 3 Encounters:  08/14/15 212 lb (96.163 kg)  08/11/15 210 lb 8 oz (95.482 kg)  06/22/15 215 lb (97.523 kg)      Studies/Labs Reviewed:   EKG:  EKG  Is not performed on today's visit.  Recent Labs: 12/11/2014: TSH 1.983 06/12/2015: B Natriuretic Peptide 17.6 06/13/2015: ALT 14* 06/14/2015: Hemoglobin 12.4*; Platelets 215 06/22/2015: BUN 15; Creat 1.15; Potassium 4.4; Sodium 137   Lipid Panel    Component Value Date/Time   CHOL 288* 06/12/2015 1314   TRIG 52 06/12/2015 1314   HDL 42 06/12/2015 1314   CHOLHDL 6.9 06/12/2015 1314   VLDL 10 06/12/2015 1314   LDLCALC 236* 06/12/2015 1314    Additional studies/ [name] that were reviewed today include:  Cardiac cath acute results are as follows:   Acute inferolateral myocardial infarction due to occlusion of a very large first obtuse marginal in the mid segment.  Moderate proximal to mid LAD disease less than 60% obstructive. Luminal irregularities throughout the proximal and mid right coronary.  Mid inferior wall akinesis. EF 40-50%.  Successful angioplasty followed by stenting of the totally occluded obtuse marginal bifurcation  stenosis with implantation of a single drug-eluting stent across the smaller branch reducing 100% stenosis to 0% with TIMI grade 3 flow. Final post dilation balloon diameter 3.25 mm. Angioplasty was performed on the smaller obtuse marginal branch prior to stenting.   ASSESSMENT:    1. Coronary artery disease involving native coronary artery of native heart without angina pectoris   2. Essential hypertension   3. Hyperlipidemia   4. Cerebral hemorrhage (HCC)      PLAN:  In order of problems listed above:   Coronary disease is stable without recurrence of angina. He has moderate residual disease and other vascular territories that we are managing with medical therapy.  Her an excellent blood pressure control. He wonders if his  fatigue is related to excessive blood pressure control. With his history of diabetes, our goal is 130/90 mmHg or less area  Lipids are being managed by his primary care physician.  Tentative increased risk of recurrent bleeding on dual antiplatelet therapy. May be able to discontinue Plavix in September if no recurrent ischemic events.    Medication Adjustments/Labs and Tests Ordered: Current medicines are reviewed at length with the patient today.  Concerns regarding medicines are outlined above.  Medication changes, Labs and Tests ordered today are listed in the Patient Instructions below. There are no Patient Instructions on file for this visit.   Signed, Richard NoeHenry W Smith III, MD  08/14/2015 10:25 AM    [facility] [address], [place], [place]  [address] Phone: [phone]; Fax: [phone]

## 2015-08-14 NOTE — Patient Instructions (Signed)
Medication Instructions:  Your physician recommends that you continue on your current medications as directed. Please refer to the Current Medication list given to you today.   Labwork: -None  Testing/Procedures: -None  Follow-Up: Your physician wants you to follow-up in: 6 month with Dr. Katrinka BlazingSmith.  You will receive a reminder letter in the mail two months in advance. If you don't receive a letter, please call our office to schedule the follow-up appointment.   Any Other Special Instructions Will Be Listed Below (If Applicable).     If you need a refill on your cardiac medications before your next appointment, please call your pharmacy.

## 2015-08-19 ENCOUNTER — Encounter (HOSPITAL_COMMUNITY): Payer: PPO

## 2015-08-21 ENCOUNTER — Encounter (HOSPITAL_COMMUNITY): Payer: PPO

## 2015-08-25 ENCOUNTER — Encounter (HOSPITAL_COMMUNITY)
Admission: RE | Admit: 2015-08-25 | Discharge: 2015-08-25 | Disposition: A | Discharge status: Home / Self Care | Payer: PPO | Source: Ambulatory Visit | Attending: Interventional Cardiology | Admitting: Interventional Cardiology

## 2015-08-25 ENCOUNTER — Telehealth (HOSPITAL_COMMUNITY): Payer: Self-pay | Admitting: Cardiac Rehabilitation

## 2015-08-25 VITALS — BP 130/70 | HR 85 | Ht 71.5 in | Wt 214.9 lb

## 2015-08-25 DIAGNOSIS — Z7982 Long term (current) use of aspirin: Secondary | ICD-10-CM | POA: Diagnosis not present

## 2015-08-25 DIAGNOSIS — I213 ST elevation (STEMI) myocardial infarction of unspecified site: Secondary | ICD-10-CM | POA: Insufficient documentation

## 2015-08-25 DIAGNOSIS — Z955 Presence of coronary angioplasty implant and graft: Secondary | ICD-10-CM | POA: Diagnosis not present

## 2015-08-25 DIAGNOSIS — I2510 Atherosclerotic heart disease of native coronary artery without angina pectoris: Secondary | ICD-10-CM | POA: Diagnosis not present

## 2015-08-25 DIAGNOSIS — E119 Type 2 diabetes mellitus without complications: Secondary | ICD-10-CM | POA: Insufficient documentation

## 2015-08-25 DIAGNOSIS — Z794 Long term (current) use of insulin: Secondary | ICD-10-CM | POA: Insufficient documentation

## 2015-08-25 DIAGNOSIS — I119 Hypertensive heart disease without heart failure: Secondary | ICD-10-CM | POA: Insufficient documentation

## 2015-08-25 DIAGNOSIS — Z79899 Other long term (current) drug therapy: Secondary | ICD-10-CM | POA: Diagnosis not present

## 2015-08-25 HISTORY — DX: ST elevation (STEMI) myocardial infarction of unspecified site: I21.3

## 2015-08-25 NOTE — Telephone Encounter (Signed)
Per [employer] rep:  [facility] in network, no deductible.  $3400 oop maximum, $983.23 met to date.  $15 copay per visit. Ref:  [id number].  Pt informed of this information. Understanding verbalized

## 2015-08-25 NOTE — Progress Notes (Signed)
Cardiac Rehab Medication Review by a Pharmacist  Does the patient  feel that his/her medications are working for him/her?  yes  Has the patient been experiencing any side effects to the medications prescribed?  no  Does the patient measure his/her own blood pressure or blood glucose at home?  Yes, both  Does the patient have any problems obtaining medications due to transportation or finances?   no  Understanding of regimen: excellent Understanding of indications: excellent Potential of compliance: excellent    Pharmacist comments: Patient fully understands his regimen and why he is taking his medications. He is not having any side effects or problems. He monitors his BP daily and knows when to call his doctor if it is too low or too hight. I answered his questions and updated his medication history. He was recently switched within the last week from Benicar HCT (olmesartan HCTZ) to lisinopril-HCTZ.    Arcola JanskyMeagan Kiannah Grunow, PharmD Clinical Pharmacy Resident Pager: [phone] 08/25/2015 1:45 PM

## 2015-08-26 ENCOUNTER — Encounter (HOSPITAL_COMMUNITY): Payer: PPO

## 2015-08-27 ENCOUNTER — Encounter (HOSPITAL_COMMUNITY): Payer: Self-pay

## 2015-08-27 NOTE — Progress Notes (Signed)
Cardiac Individual Treatment Plan  Patient Details  Name: Richard [name] MRN: [id number] Date of Birth: 11/18/1945 Referring Provider:        CARDIAC REHAB PHASE II ORIENTATION from 08/25/2015 in [facility] CARD[facility]AC REHAB   Referring Provider  Verdis Prime III MD      Initial Encounter Date:       CARDIAC REHAB PHASE II ORIENTATION from 08/25/2015 in [facility] C[facility]AC REHAB   Date  08/25/15   Referring Provider  Verdis Prime III MD      Visit Diagnosis: ST elevation myocardial infarction (STEMI), unspecified artery (HCC)  Patient's Home Medications on Admission:  Current outpatient prescriptions:  .  amLODipine (NORVASC) 10 MG tablet, Take 1 tablet (10 mg total) by mouth daily., Disp: 30 tablet, Rfl: 1 .  aspirin EC 81 MG tablet, Take 81 mg by mouth daily., Disp: , Rfl:  .  atorvastatin (LIPITOR) 80 MG tablet, Take 1 tablet (80 mg total) by mouth daily at 6 PM., Disp: 30 tablet, Rfl: 6 .  carvedilol (COREG) 6.25 MG tablet, Take 1 tablet (6.25 mg total) by mouth 2 (two) times daily with a meal., Disp: 60 tablet, Rfl: 6 .  clopidogrel (PLAVIX) 75 MG tablet, Take 1 tablet (75 mg total) by mouth daily with breakfast., Disp: 30 tablet, Rfl: 6 .  insulin NPH Human (HUMULIN N,NOVOLIN N) 100 UNIT/ML injection, Inject into the skin. Takes 40 units in the am and 30 units in the pm, RN from [facility] call pharmacy its a different brand name, patient takes this brand t/c call made 05/13/2015, Disp: , Rfl:  .  insulin regular (NOVOLIN R,HUMULIN R) 100 units/mL injection, Inject into the skin twice daily as needed per sliding scale., Disp: , Rfl:  .  lisinopril-hydrochlorothiazide (PRINZIDE,ZESTORETIC) 20-12.5 MG tablet, Take 1 tablet by mouth 2 (two) times daily., Disp: , Rfl:  .  Multiple Vitamin (MULTIVITAMIN WITH MINERALS) TABS tablet, Take 1 tablet by mouth daily., Disp: , Rfl:  .  nitroGLYCERIN (NITROSTAT) 0.4 MG SL tablet, Place 1 tablet (0.4 mg total) under the tongue  every 5 (five) minutes as needed for chest pain., Disp: 25 tablet, Rfl: 3  Past Medical History: Past Medical History  Diagnosis Date  . Diabetes mellitus without complication (HCC)   . Stroke (HCC)   . Left eye injury   . Intracranial hemorrhage (HCC)     a. 11/2014 in setting of marked HTN->3.5x2.5 left frontal ICH (12-69ml by MRI); b. 11/2014 MRA no evidence of AVM or CAA; c. 11/2014 Carotid U/S: unremarkable.  . Hypertensive heart disease   . ETOH abuse   . Diastolic dysfunction     a. 11/2014 Echo: EF 60-65%, gr1 DD, mildly dil RA.  Marland Kitchen CAD (coronary artery disease)     a. 05/2015 Inflat STEMI/PCI: LM nl, LAD 50p, Om1 100 (3.0 x 16mm Promus Premier DES), OM2/3 small, RCA 40p/d, EF 45-50%.  . H/O ventricular fibrillation     a. 05/2015 in setting of inferolateral STEMI.  . ST elevation MI (STEMI) (HCC) 06/12/15    Tobacco Use: History  Smoking status  . Never Smoker   Smokeless tobacco  . Never Used    Labs:     Recent Review Flowsheet Data    Labs for ITP Cardiac and Pulmonary Rehab Latest Ref Rng 12/10/2014 12/11/2014 12/12/2014 06/12/2015   Cholestrol 0 - 200 mg/dL - 409 - 811(B)   LDLCALC 0 - 99 mg/dL - 147(W) - 295(A)   HDL >40  mg/dL - 41 - 42   Trlycerides <150 mg/dL - 469(G) - 52   Hemoglobin A1c 4.8 - 5.6 % - 7.5(H) - 7.5(H)   PHART 7.350 - 7.450 - - 7.357 -   PCO2ART 35.0 - 45.0 mmHg - - 42.8 -   HCO3 20.0 - 24.0 mEq/L - - 23.3 -   TCO2 0 - 100 mmol/L 26 - 24.6 29   ACIDBASEDEF 0.0 - 2.0 mmol/L - - 1.3 -   O2SAT - - - 99.9 -      Capillary Blood Glucose: Lab Results  Component Value Date   GLUCAP 185* 06/14/2015   GLUCAP 76 06/14/2015   GLUCAP 113* 06/13/2015   GLUCAP 165* 06/13/2015   GLUCAP 192* 06/13/2015     Exercise Target Goals: Date: 08/25/15  Exercise Program Goal: Individual exercise prescription set with THRR, safety & activity barriers. Participant demonstrates ability to understand and report RPE using BORG scale, to self-measure pulse  accurately, and to acknowledge the importance of the exercise prescription.  Exercise Prescription Goal: Starting with aerobic activity 30 plus minutes a day, 3 days per week for initial exercise prescription. Provide home exercise prescription and guidelines that participant acknowledges understanding prior to discharge.  Activity Barriers & Risk Stratification:     Activity Barriers & Cardiac Risk Stratification - 08/25/15 1420    Activity Barriers & Cardiac Risk Stratification   Activity Barriers Muscular Weakness;Assistive Device;Other (comment)   Comments Right sided weakness from stroke.   Cardiac Risk Stratification High      6 Minute Walk:   Initial Exercise Prescription:     Initial Exercise Prescription - 08/25/15 1700    Date of Initial Exercise RX and Referring Provider   Date 08/25/15   Referring Provider Verdis Prime III MD   Bike   Level 1   Minutes 10   METs 2.94   NuStep   Level 3   Minutes 10   METs 2.6   Track   Laps 10   Minutes 10   METs 2.74   Prescription Details   Frequency (times per week) 3   Duration Progress to 30 minutes of continuous aerobic without signs/symptoms of physical distress   Intensity   THRR 40-80% of Max Heartrate 60-121   Ratings of Perceived Exertion 11-13   Perceived Dyspnea 0-4   Progression   Progression Continue to progress workloads to maintain intensity without signs/symptoms of physical distress.   Resistance Training   Training Prescription Yes   Weight 2lbs.      Perform Capillary Blood Glucose checks as needed.  Exercise Prescription Changes:   Exercise Comments:   Discharge Exercise Prescription (Final Exercise Prescription Changes):   Nutrition:  Target Goals: Understanding of nutrition guidelines, daily intake of sodium 1500mg , cholesterol 200mg , calories 30% from fat and 7% or less from saturated fats, daily to have 5 or more servings of fruits and vegetables.  Biometrics:     Pre  Biometrics - 08/25/15 1716    Pre Biometrics   Waist Circumference 40.75 inches   Hip Circumference 42.5 inches   Waist to Hip Ratio 0.96 %   Triceps Skinfold 19 mm   % Body Fat 29.2 %   Grip Strength 42 kg   Flexibility 8 in   Single Leg Stand 4.59 seconds       Nutrition Therapy Plan and Nutrition Goals:     Nutrition Therapy & Goals - 08/26/15 1515    Nutrition Therapy   Diet Diabetic, Therapeutic Lifestyle  Changes   Personal Nutrition Goals   Personal Goal #1 0.5-2 lb wt loss/week to a goal wt loss of 6-15 lb at graduation from Cardiac Rehab   Intervention Plan   Intervention Prescribe, educate and counsel regarding individualized specific dietary modifications aiming towards targeted core components such as weight, hypertension, lipid management, diabetes, heart failure and other comorbidities.   Expected Outcomes Short Term Goal: Understand basic principles of dietary content, such as calories, fat, sodium, cholesterol and nutrients.;Long Term Goal: Adherence to prescribed nutrition plan.      Nutrition Discharge: Nutrition Scores:     Nutrition Assessments - 08/26/15 1516    MEDFICTS Scores   Pre Score 9  will verify score with pt      Nutrition Goals Re-Evaluation:   Psychosocial: Target Goals: Acknowledge presence or absence of depression, maximize coping skills, provide positive support system. Participant is able to verbalize types and ability to use techniques and skills needed for reducing stress and depression.  Initial Review & Psychosocial Screening:     Initial Psych Review & Screening - 08/27/15 0918    Family Dynamics   Good Support System? Yes   Barriers   Psychosocial barriers to participate in program There are no identifiable barriers or psychosocial needs.   Screening Interventions   Interventions Encouraged to exercise      Quality of Life Scores:     Quality of Life - 08/25/15 1717    Quality of Life Scores   Health/Function Pre  23.47 %   Socioeconomic Pre 24.58 %   Psych/Spiritual Pre 25.75 %   Family Pre 28.8 %   GLOBAL Pre 24.94 %      PHQ-9:     Recent Review Flowsheet Data    [facility]   Decreased Interest 2   Down, Depressed, Hopeless 0   PHQ - 2 Score 2   Altered sleeping 0   Tired, decreased energy 3   Change in appetite 0   Feeling bad or failure about yourself  0   Trouble concentrating 3   Moving slowly or fidgety/restless 3   Suicidal thoughts 0   PHQ-9 Score 11   Difficult doing work/chores Very difficult      Psychosocial Evaluation and Intervention:   Psychosocial Re-Evaluation:   Vocational Rehabilitation: Provide vocational rehab assistance to qualifying candidates.   Vocational Rehab Evaluation & Intervention:     Vocational Rehab - 08/25/15 1615    Initial Vocational Rehab Evaluation & Intervention   Assessment shows need for Vocational Rehabilitation No      Education: Education Goals: Education classes will be provided on a weekly basis, covering required topics. Participant will state understanding/return demonstration of topics presented.  Learning Barriers/Preferences:     Learning Barriers/Preferences - 08/25/15 1421    Learning Barriers/Preferences   Learning Barriers Reading  Since stroke, some memory deficits, difficulty reading, and has to concentrate more to express thoughts.   Learning Preferences Verbal Instruction;Pictoral      Education Topics: Count Your Pulse:  -Group instruction provided by verbal instruction, demonstration, patient participation and written materials to support subject.  Instructors address importance of being able to find your pulse and how to count your pulse when at home without a heart monitor.  Patients get hands on experience counting their pulse with staff help and individually.   Heart Attack, Angina, and Risk Factor Modification:  -Group instruction provided by verbal instruction,  video, and written materials to support subject.  Instructors address  signs and symptoms of angina and heart attacks.    Also discuss risk factors for heart disease and how to make changes to improve heart health risk factors.   Functional Fitness:  -Group instruction provided by verbal instruction, demonstration, patient participation, and written materials to support subject.  Instructors address safety measures for doing things around the house.  Discuss how to get up and down off the floor, how to pick things up properly, how to safely get out of a chair without assistance, and balance training.   Meditation and Mindfulness:  -Group instruction provided by verbal instruction, patient participation, and written materials to support subject.  Instructor addresses importance of mindfulness and meditation practice to help reduce stress and improve awareness.  Instructor also leads participants through a meditation exercise.    Stretching for Flexibility and Mobility:  -Group instruction provided by verbal instruction, patient participation, and written materials to support subject.  Instructors lead participants through series of stretches that are designed to increase flexibility thus improving mobility.  These stretches are additional exercise for major muscle groups that are typically performed during regular warm up and cool down.   Hands Only CPR Anytime:  -Group instruction provided by verbal instruction, video, patient participation and written materials to support subject.  Instructors co-teach with AHA video for hands only CPR.  Participants get hands on experience with mannequins.   Nutrition I class: Heart Healthy Eating:  -Group instruction provided by PowerPoint slides, verbal discussion, and written materials to support subject matter. The instructor gives an explanation and review of the Therapeutic Lifestyle Changes diet recommendations, which includes a discussion on lipid goals,  dietary fat, sodium, fiber, plant stanol/sterol esters, sugar, and the components of a well-balanced, healthy diet.   Nutrition II class: Lifestyle Skills:  -Group instruction provided by PowerPoint slides, verbal discussion, and written materials to support subject matter. The instructor gives an explanation and review of label reading, grocery shopping for heart health, heart healthy recipe modifications, and ways to make healthier choices when eating out.   Diabetes Question & Answer:  -Group instruction provided by PowerPoint slides, verbal discussion, and written materials to support subject matter. The instructor gives an explanation and review of diabetes co-morbidities, pre- and post-prandial blood glucose goals, pre-exercise blood glucose goals, signs, symptoms, and treatment of hypoglycemia and hyperglycemia, and foot care basics.   Diabetes Blitz:  -Group instruction provided by PowerPoint slides, verbal discussion, and written materials to support subject matter. The instructor gives an explanation and review of the physiology behind type 1 and type 2 diabetes, diabetes medications and rational behind using different medications, pre- and post-prandial blood glucose recommendations and Hemoglobin A1c goals, diabetes diet, and exercise including blood glucose guidelines for exercising safely.    Portion Distortion:  -Group instruction provided by PowerPoint slides, verbal discussion, written materials, and food models to support subject matter. The instructor gives an explanation of serving size versus portion size, changes in portions sizes over the last 20 years, and what consists of a serving from each food group.   Stress Management:  -Group instruction provided by verbal instruction, video, and written materials to support subject matter.  Instructors review role of stress in heart disease and how to cope with stress positively.     Exercising on Your Own:  -Group instruction  provided by verbal instruction, power point, and written materials to support subject.  Instructors discuss benefits of exercise, components of exercise, frequency and intensity of exercise, and end points for exercise.  Also discuss use of nitroglycerin and activating EMS.  Review options of places to exercise outside of rehab.  Review guidelines for sex with heart disease.   Cardiac Drugs I:  -Group instruction provided by verbal instruction and written materials to support subject.  Instructor reviews cardiac drug classes: antiplatelets, anticoagulants, beta blockers, and statins.  Instructor discusses reasons, side effects, and lifestyle considerations for each drug class.   Cardiac Drugs II:  -Group instruction provided by verbal instruction and written materials to support subject.  Instructor reviews cardiac drug classes: angiotensin converting enzyme inhibitors (ACE-I), angiotensin II receptor blockers (ARBs), nitrates, and calcium channel blockers.  Instructor discusses reasons, side effects, and lifestyle considerations for each drug class.   Anatomy and Physiology of the Circulatory System:  -Group instruction provided by verbal instruction, video, and written materials to support subject.  Reviews functional anatomy of heart, how it relates to various diagnoses, and what role the heart plays in the overall system.   Knowledge Questionnaire Score:     Knowledge Questionnaire Score - 08/25/15 1719    Knowledge Questionnaire Score   Pre Score 17/24      Core Components/Risk Factors/Patient Goals at Admission:     Personal Goals and Risk Factors at Admission - 08/25/15 1720    Core Components/Risk Factors/Patient Goals on Admission    Weight Management Yes;Weight Loss   Intervention Weight Management: Develop a combined nutrition and exercise program designed to reach desired caloric intake, while maintaining appropriate intake of nutrient and fiber, sodium and fats, and  appropriate energy expenditure required for the weight goal.   Admit Weight 214 lb (97.07 kg)   Goal Weight: Short Term 208 lb (94.348 kg)   Goal Weight: Long Term 199 lb (90.266 kg)   Expected Outcomes Weight Loss: Understanding of general recommendations for a balanced deficit meal plan, which promotes 1-2 lb weight loss per week and includes a negative energy balance of [phone] kcal/d;Long Term: Adherence to nutrition and physical activity/exercise program aimed toward attainment of established weight goal   Increase Strength and Stamina Yes   Intervention Provide advice, education, support and counseling about physical activity/exercise needs.;Develop an individualized exercise prescription for aerobic and resistive training based on initial evaluation findings, risk stratification, comorbidities and participant's personal goals.   Expected Outcomes Achievement of increased cardiorespiratory fitness and enhanced flexibility, muscular endurance and strength shown through measurements of functional capacity and personal statement of participant.   Personal Goal Other Yes   Personal Goal Feel less tired, improve health, feel less stress on body(stronger).   Intervention Develop individualized exercise program to improve cardiorespiratory fitness.   Expected Outcomes Feel stronger as cardiorespiratory fitness and strength improve with regular exercise.      Core Components/Risk Factors/Patient Goals Review:    Core Components/Risk Factors/Patient Goals at Discharge (Final Review):    ITP Comments:     ITP Comments      08/25/15 1404           ITP Comments Dr. Armanda Magic, Medical Director          Comments:Patient attended orientation from 1330 to 1530 to review rules and guidelines for program. Completed 6 minute walk test, Intitial ITP, and exercise prescription.  VSS. Telemetry-Sinus rhythm t wave inversion rare PVC.  Asymptomatic.

## 2015-08-28 ENCOUNTER — Encounter (HOSPITAL_COMMUNITY): Payer: PPO

## 2015-08-31 ENCOUNTER — Encounter (HOSPITAL_COMMUNITY)
Admission: RE | Admit: 2015-08-31 | Discharge: 2015-08-31 | Disposition: A | Discharge status: Home / Self Care | Payer: PPO | Source: Ambulatory Visit | Attending: Interventional Cardiology | Admitting: Interventional Cardiology

## 2015-08-31 DIAGNOSIS — E119 Type 2 diabetes mellitus without complications: Secondary | ICD-10-CM | POA: Diagnosis not present

## 2015-08-31 DIAGNOSIS — Z955 Presence of coronary angioplasty implant and graft: Secondary | ICD-10-CM | POA: Insufficient documentation

## 2015-08-31 DIAGNOSIS — I119 Hypertensive heart disease without heart failure: Secondary | ICD-10-CM | POA: Diagnosis not present

## 2015-08-31 DIAGNOSIS — Z794 Long term (current) use of insulin: Secondary | ICD-10-CM | POA: Insufficient documentation

## 2015-08-31 DIAGNOSIS — Z7982 Long term (current) use of aspirin: Secondary | ICD-10-CM | POA: Insufficient documentation

## 2015-08-31 DIAGNOSIS — I213 ST elevation (STEMI) myocardial infarction of unspecified site: Secondary | ICD-10-CM | POA: Diagnosis not present

## 2015-08-31 DIAGNOSIS — Z79899 Other long term (current) drug therapy: Secondary | ICD-10-CM | POA: Diagnosis not present

## 2015-08-31 DIAGNOSIS — I2510 Atherosclerotic heart disease of native coronary artery without angina pectoris: Secondary | ICD-10-CM | POA: Diagnosis not present

## 2015-08-31 LAB — GLUCOSE, CAPILLARY: GLUCOSE-CAPILLARY: 243 mg/dL — AB (ref 65–99)

## 2015-08-31 NOTE — Progress Notes (Addendum)
Daily Session Note  Patient Details  Name: Richard [name] MRN: [id number] Date of Birth: 18-Nov-1945 Referring Provider:            [facility] from 08/25/2015 in [facility]   Referring Provider  Daneen Schick III MD      Encounter Date: 08/31/2015  Check In:     Session Check In - 08/31/15 1221    Check-In   Location [facility]   Staff Present Maurice Small, RN, Deland Pretty, MS, ACSM CEP, Exercise Physiologist;Molly diVincenzo, MS, ACSM RCEP, Exercise Physiologist;Maria Venetia Maxon, RN, BSN   Supervising physician immediately available to respond to emergencies [facility] immediately available   Physician(s) Dr. Marily Memos    Medication changes reported     No   Fall or balance concerns reported    No   Warm-up and Cool-down Performed as group-led instruction   Resistance Training Performed Yes   VAD Patient? No   Pain Assessment   Currently in Pain? No/denies      Capillary Blood Glucose: Results for orders placed or performed during the hospital encounter of 08/31/15 (from the past 24 hour(s))  Glucose, capillary     Status: Abnormal   Collection Time: 08/31/15 12:12 PM  Result Value Ref Range   Glucose-Capillary 243 (H) 65 - 99 mg/dL     Goals Met:  Exercise tolerated well Personal goals reviewed  Goals Unmet:  Not Applicable  Comments: Pt started full exercise today at the phase II  cardiac rehab program.  Pt tolerated light exercise without difficulty. VSS with initial low exit bp that responded well with H20, telemetry-SR with inverted T waves and rare pvc, asymptomatic.  Medication list reconciled. Pt denies barriers to medicaiton compliance.  PSYCHOSOCIAL ASSESSMENT:  PHQ-0. Pt exhibits positive coping skills, hopeful outlook with supportive family which includes his wife. No psychosocial needs identified at this time, no psychosocial interventions necessary.    Pt enjoys playing  golf.  Pt reports that prior to his stroke he was a very good player.  Pt desires to feel less tired when doing everyday chores.  Pt also desires as a long term goal is improve health, gain more stamina and feel less stress on body.  Pt exercised previous to his cardiac event with walking 5 days a week 30-40 minutes Pt oriented to exercise equipment and routine.    Understanding verbalized. Maurice Small RN, BSN    Dr. Fransico Him is Medical Director for Cardiac Rehab at [facility].

## 2015-09-01 NOTE — Therapy (Signed)
[place] [facility] [address] [place], [place], [address] Phone: [phone]   Fax:  [phone]  Patient Details  Name: Dastan Krider MRN: [id number] Date of Birth: 1945-08-18 Referring Provider:  No ref. provider found  Encounter Date: 09/01/2015  SPEECH THERAPY DISCHARGE SUMMARY  Visits from Start of Care: 11  Current functional level related to goals / functional outcomes: Pt was seen for ST until January 2017, and a speech screen was completed in February 2017 per pt request. Those notes are immediately below:    S: Pt arrives today for speech screen, at his request upon d/c from therapy in early 2017.  O: Pt's speech remains much like it was at time of discharge. 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 Pt reports more difficulty with speech fluidity when thinking listener is in a hurry. SLP and pt talked through the need of a psychological solution to this problem and pt named solution of stopping verbalization, breathing deeply x1-2, and self-talk that he needs to focus on relaxed and slow speech instead of the need to communicate a certain way.  A. Pt needs to cont with speech compensations. He told SLP that he knew he was putting pressure on himself to communicate quickly and needs to take that pressure off of himself.   P: Pt will cont to use compensations for speech fluidity. He should continue to improve over time. SLP told pt a "tune-up" ST evaluation and therapy may be desired by pt in approx 6 months. A prescription will be necessary at that time.   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 Goal update from his last therapy appointment 04-13-15 follows:  SLP Short Term Goals - 03/05/15 0948     SLP SHORT TERM GOAL #1     Title  pt will describe pictures in detail with 80% success using compensations     Status   Achieved     SLP SHORT TERM GOAL #2     Title  pt will write short lists, and other functional word level tasks, with extra time and rare min A     Time  --     Period  --     Status  Achieved     SLP SHORT TERM GOAL #3     Title  pt will tell SLP three compensations for expressive language      Status  Achieved                SLP Long Term Goals - 04/13/15 1045     SLP LONG TERM GOAL #1     Title  pt will functionally participate in 10 minutes min-mod copmlex conversation using compensations     Time  4      Period  Weeks     Status  On-going  with revisions and repetitions, partially met on 04-13-15     SLP LONG TERM GOAL #2     Title  pt will engage in 8 minutes mod complex conversation with occasional min A     Time  4     Period  Weeks     Status  On-going  not met on 04-13-15     SLP LONG TERM GOAL #3     Title  pt will write short notes with occasional min A     Time  4  Period  Weeks     Status  Achieved       Education / Equipment: Compensations for apraxia/aphasia.  Plan: Patient agrees to discharge.  Patient goals were partially met. Patient is being discharged due to                                                     ?????meeting the current rehab potential.       [facility] 09/01/2015, 9:24 AM  [place] [address] [place], [place], [address] Phone: [phone]   Fax:  [phone]

## 2015-09-02 ENCOUNTER — Encounter (HOSPITAL_COMMUNITY)
Admission: RE | Admit: 2015-09-02 | Discharge: 2015-09-02 | Disposition: A | Discharge status: Home / Self Care | Payer: PPO | Source: Ambulatory Visit | Attending: Interventional Cardiology | Admitting: Interventional Cardiology

## 2015-09-02 ENCOUNTER — Encounter (HOSPITAL_COMMUNITY): Payer: PPO

## 2015-09-02 DIAGNOSIS — I213 ST elevation (STEMI) myocardial infarction of unspecified site: Secondary | ICD-10-CM

## 2015-09-02 LAB — GLUCOSE, CAPILLARY
Glucose-Capillary: 152 mg/dL — ABNORMAL HIGH (ref 65–99)
Glucose-Capillary: 164 mg/dL — ABNORMAL HIGH (ref 65–99)

## 2015-09-04 ENCOUNTER — Encounter (HOSPITAL_COMMUNITY)
Admission: RE | Admit: 2015-09-04 | Discharge: 2015-09-04 | Disposition: A | Discharge status: Home / Self Care | Payer: PPO | Source: Ambulatory Visit | Attending: Interventional Cardiology | Admitting: Interventional Cardiology

## 2015-09-04 ENCOUNTER — Encounter (HOSPITAL_COMMUNITY): Payer: PPO

## 2015-09-04 DIAGNOSIS — I213 ST elevation (STEMI) myocardial infarction of unspecified site: Secondary | ICD-10-CM

## 2015-09-04 LAB — GLUCOSE, CAPILLARY: Glucose-Capillary: 234 mg/dL — ABNORMAL HIGH (ref 65–99)

## 2015-09-04 NOTE — Progress Notes (Signed)
Daily Session Note  Patient Details  Name: Richard [name] MRN: [id number] Date of Birth: 09-15-1945 Referring Provider:        CARDIAC REHAB PHASE II ORIENTATION from 08/25/2015 in [place]   Referring Provider  Daneen Schick III MD      Encounter Date: 09/04/2015  Check In:     Session Check In - 09/04/15 1131    Check-In   Location [facility]   Staff Present Amber Fair, MS, ACSM RCEP, Exercise Physiologist;Molly diVincenzo, MS, ACSM RCEP, Exercise Physiologist;Reannon Candella Wilber Oliphant, RN, BSN   Supervising physician immediately available to respond to emergencies [facility] immediately available   Physician(s) Dr. Marily Memos    Medication changes reported     No   Fall or balance concerns reported    No   Warm-up and Cool-down Performed as group-led instruction   Resistance Training Performed Yes   VAD Patient? No   Pain Assessment   Currently in Pain? No/denies   Multiple Pain Sites No      Capillary Blood Glucose: No results found for this or any previous visit (from the past 24 hour(s)).   Goals Met:  Exercise tolerated well  Goals Unmet:  Not Applicable  Comments:  QUALITY OF LIFE SCORE REVIEW  Pt completed Quality of Life survey as a participant in Cardiac Rehab. Scores 21.0 or below are considered low. Pt scored the following      Quality of Life - 08/25/15 1717    Quality of Life Scores   Health/Function Pre 23.47 %   Socioeconomic Pre 24.58 %   Psych/Spiritual Pre 25.75 %   Family Pre 28.8 %   GLOBAL Pre 24.94 %        Pt scored well above the low threshold of 21.  Pt exhibits positive and healthy coping skills. Pt has a support family.  Pt remarks his outlook on life is positive and he is looking forward to participating in cardiac rehab.  Will continue to monitor and intervene as necessary.  Maurice Small RN, BSN     Dr. Fransico Him is Medical Director for Cardiac Rehab at [facility].

## 2015-09-07 ENCOUNTER — Encounter (HOSPITAL_COMMUNITY)
Admission: RE | Admit: 2015-09-07 | Discharge: 2015-09-07 | Disposition: A | Discharge status: Home / Self Care | Payer: PPO | Source: Ambulatory Visit | Attending: Interventional Cardiology | Admitting: Interventional Cardiology

## 2015-09-07 DIAGNOSIS — I213 ST elevation (STEMI) myocardial infarction of unspecified site: Secondary | ICD-10-CM

## 2015-09-07 NOTE — Progress Notes (Signed)
Richard [name] 70 y.o. male Nutrition Note Spoke with pt. Nutrition Plan and Nutrition Survey goals reviewed with pt. Pt is following Step 2 of the Therapeutic Lifestyle Changes diet. Pt wants to lose wt. Pt has been trying to lose wt by increasing his activity and decreasing portion sizes. Wt loss tips reviewed. Pt is diabetic. Last A1c per pt was 7.1, which indicates blood glucose well-controlled given pt's co-morbidities. This Clinical research associatewriter went over Diabetes Education test results. Pt checks CBG's 2-4 times a day. Pt expressed understanding of the information reviewed. Pt aware of nutrition education classes offered.  Lab Results  Component Value Date   HGBA1C 7.5* 06/12/2015   Wt Readings from Last 3 Encounters:  08/25/15 214 lb 15.2 oz (97.5 kg)  08/14/15 212 lb (96.163 kg)  08/11/15 210 lb 8 oz (95.482 kg)   Nutrition Diagnosis ? Food-and nutrition-related knowledge deficit related to lack of exposure to information as related to diagnosis of: ? CVD ? DM ? Overweight related to excessive energy intake as evidenced by a BMI of 29.6  Nutrition RX/ Estimated Daily Nutrition Needs for: wt loss 1650-2150 Kcal, 45-60 gm fat, 10-14 gm sat fat, 1.6-2.2 gm trans-fat, <1500 mg sodium, 175-250 gm CHO   Nutrition Intervention ? Pt's individual nutrition plan reviewed with pt. ? Benefits of adopting Therapeutic Lifestyle Changes discussed when Medficts reviewed. ? Pt to attend the Portion Distortion class ? Pt to attend the Diabetes Q & A class ? Pt to attend the   ? Nutrition I class                  ? Nutrition II class     ? Diabetes Blitz class ? Pt given handouts for: ? Nutrition I class ? Nutrition II class ? Diabetes Blitz Class ? Continue client-centered nutrition education by RD, as part of interdisciplinary care. Goal(s) ? Pt to identify food quantities necessary to achieve weight loss of 6-24 lb (2.7-10.9 kg) at graduation from cardiac rehab.  ? CBG concentrations in the normal range or  as close to normal as is safely possible. Monitor and Evaluate progress toward nutrition goal with team. Mickle PlumbEdna Eugene Zeiders, M.Ed, RD, LDN, CDE 09/07/2015 1:48 PM

## 2015-09-09 ENCOUNTER — Encounter (HOSPITAL_COMMUNITY): Payer: PPO

## 2015-09-09 ENCOUNTER — Encounter (HOSPITAL_COMMUNITY)
Admission: RE | Admit: 2015-09-09 | Discharge: 2015-09-09 | Disposition: A | Discharge status: Home / Self Care | Payer: PPO | Source: Ambulatory Visit | Attending: Interventional Cardiology | Admitting: Interventional Cardiology

## 2015-09-09 DIAGNOSIS — I213 ST elevation (STEMI) myocardial infarction of unspecified site: Secondary | ICD-10-CM | POA: Diagnosis not present

## 2015-09-09 LAB — GLUCOSE, CAPILLARY: GLUCOSE-CAPILLARY: 136 mg/dL — AB (ref 65–99)

## 2015-09-11 ENCOUNTER — Encounter (HOSPITAL_COMMUNITY): Payer: PPO

## 2015-09-11 ENCOUNTER — Encounter (HOSPITAL_COMMUNITY)
Admission: RE | Admit: 2015-09-11 | Discharge: 2015-09-11 | Disposition: A | Discharge status: Home / Self Care | Payer: PPO | Source: Ambulatory Visit | Attending: Interventional Cardiology | Admitting: Interventional Cardiology

## 2015-09-11 DIAGNOSIS — I213 ST elevation (STEMI) myocardial infarction of unspecified site: Secondary | ICD-10-CM

## 2015-09-11 NOTE — Progress Notes (Signed)
Reviewed home exercise guidelines with patient including endpoints, temperature precautions, target heart rate and rate of perceived exertion. Pt is currently walking for 30 minutes 2 days/ week  as his mode of home exercise. Pt voices understanding of instructions given. Artist Paislinty M Raneen Jaffer, MS, ACSM CCEP

## 2015-09-14 ENCOUNTER — Encounter (HOSPITAL_COMMUNITY)
Admission: RE | Admit: 2015-09-14 | Discharge: 2015-09-14 | Disposition: A | Discharge status: Home / Self Care | Payer: PPO | Source: Ambulatory Visit | Attending: Interventional Cardiology | Admitting: Interventional Cardiology

## 2015-09-14 DIAGNOSIS — I213 ST elevation (STEMI) myocardial infarction of unspecified site: Secondary | ICD-10-CM

## 2015-09-14 DIAGNOSIS — Z955 Presence of coronary angioplasty implant and graft: Secondary | ICD-10-CM

## 2015-09-14 LAB — GLUCOSE, CAPILLARY
GLUCOSE-CAPILLARY: 152 mg/dL — AB (ref 65–99)
GLUCOSE-CAPILLARY: 260 mg/dL — AB (ref 65–99)

## 2015-09-14 NOTE — Progress Notes (Addendum)
Incomplete Session Note  Patient Details  Name: Richard [name] MRN: [id number] Date of Birth: 02/18/1946 Referring Provider:        [facility]   Referring Provider  Verdis PrimeSmith, Henry III MD      Richard [name] did not complete his rehab session.  Pt felt his blood glucose was low.  Pt had morning blood glucose of 79. Pt ate breakfast and did not recheck his blood glucose.  Pt took his diabetic medication.  Checked pt 217. Pt felt this was not right and requested another check.  Recheck using different hand 260.  Pt felt his legs were too weak to exercise.  bp checked 100/50 pulse rate 76 regular. Pt felt the blood glucose reading was not correct. Explained to pt that controls are ran every day.  Pt still felt the reading was too high.  Pt given gatorade to drink and offered a banana.  Pt sat in rehab for about 30 minutes.  Pt then felt okay to drive himself back home.  Requested a call back and asked him to check his blood glucose on his meter and call back with the reading results.  Pt verbalized understanding and is in agreement. Alanson Alyarlette Janaa Acero RN, BSN

## 2015-09-16 ENCOUNTER — Encounter (HOSPITAL_COMMUNITY): Payer: PPO

## 2015-09-16 ENCOUNTER — Encounter (HOSPITAL_COMMUNITY)
Admission: RE | Admit: 2015-09-16 | Discharge: 2015-09-16 | Disposition: A | Discharge status: Home / Self Care | Payer: PPO | Source: Ambulatory Visit | Attending: Interventional Cardiology | Admitting: Interventional Cardiology

## 2015-09-16 DIAGNOSIS — Z955 Presence of coronary angioplasty implant and graft: Secondary | ICD-10-CM

## 2015-09-16 DIAGNOSIS — I213 ST elevation (STEMI) myocardial infarction of unspecified site: Secondary | ICD-10-CM | POA: Diagnosis not present

## 2015-09-17 NOTE — Progress Notes (Signed)
Cardiac Individual Treatment Plan  Patient Details  Name: Richard JakschLeon [name] MRN: [id number] Date of Birth: 05/30/1945 Referring Provider:        CARDIAC REHAB PHASE II [facility]ATION [facility]   Referring Provider  Verdis PrimeSmith, Henry III MD      Initial Encounter Date:       CARDIAC REHAB PHASE II [facility]   Date  08/25/15   Referring Provider  Verdis PrimeSmith, Henry III MD      Visit Diagnosis: ST elevation myocardial infarction (STEMI), unspecified artery (HCC)  S/P coronary artery stent placement  Patient's Home Medications on Admission:  Current outpatient prescriptions:  .  amLODipine (NORVASC) 10 MG tablet, Take 1 tablet (10 mg total) by mouth daily., Disp: 30 tablet, Rfl: 1 .  aspirin EC 81 MG tablet, Take 81 mg by mouth daily., Disp: , Rfl:  .  atorvastatin (LIPITOR) 80 MG tablet, Take 1 tablet (80 mg total) by mouth daily at 6 PM., Disp: 30 tablet, Rfl: 6 .  carvedilol (COREG) 6.25 MG tablet, Take 1 tablet (6.25 mg total) by mouth 2 (two) times daily with a meal., Disp: 60 tablet, Rfl: 6 .  clopidogrel (PLAVIX) 75 MG tablet, Take 1 tablet (75 mg total) by mouth daily with breakfast., Disp: 30 tablet, Rfl: 6 .  insulin NPH Human (HUMULIN N,NOVOLIN N) 100 UNIT/ML injection, Inject into the skin. Takes 40 units in the am and 30 units in the pm, [facility] its a different brand name, patient takes this brand t/c call made 05/13/2015, Disp: , Rfl:  .  insulin regular (NOVOLIN R,HUMULIN R) 100 units/mL injection, Inject into the skin twice daily as needed per sliding scale., Disp: , Rfl:  .  lisinopril-hydrochlorothiazide (PRINZIDE,ZESTORETIC) 20-12.5 MG tablet, Take 1 tablet by mouth 2 (two) times daily., Disp: , Rfl:  .  Multiple Vitamin (MULTIVITAMIN WITH MINERALS) TABS tablet, Take 1 tablet by mouth daily., Disp: , Rfl:  .  nitroGLYCERIN (NITROSTAT) 0.4 MG SL tablet, Place 1  tablet (0.4 mg total) under the tongue every 5 (five) minutes as needed for chest pain., Disp: 25 tablet, Rfl: 3  Past Medical History: Past Medical History  Diagnosis Date  . Diabetes mellitus without complication (HCC)   . Stroke (HCC)   . Left eye injury   . Intracranial hemorrhage (HCC)     a. 11/2014 in setting of marked HTN->3.5x2.5 left frontal ICH (12-6115ml by MRI); b. 11/2014 MRA no evidence of AVM or CAA; c. 11/2014 Carotid U/S: unremarkable.  . Hypertensive heart disease   . ETOH abuse   . Diastolic dysfunction     a. 11/2014 Echo: EF 60-65%, gr1 DD, mildly dil RA.  Marland Kitchen. CAD (coronary artery disease)     a. 05/2015 Inflat STEMI/PCI: LM nl, LAD 50p, Om1 100 (3.0 x 16mm Promus Premier DES), OM2/3 small, RCA 40p/d, EF 45-50%.  . H/O ventricular fibrillation     a. 05/2015 in setting of inferolateral STEMI.  . ST elevation MI (STEMI) (HCC) 06/12/15    Tobacco Use: History  Smoking status  . Never Smoker   Smokeless tobacco  . Never Used    Labs: Recent Review Flowsheet Data    Labs for ITP Cardiac and Pulmonary Rehab Latest Ref Rng 12/10/2014 12/11/2014 12/12/2014 06/12/2015   Cholestrol 0 - 200 mg/dL - 409187 - 811(B288(H)   LDLCALC 0 - 99 mg/dL - 147(W102(H) - 295(A236(H)  HDL >40 mg/dL - 41 - 42   Trlycerides <150 mg/dL - 161(W) - 52   Hemoglobin A1c 4.8 - 5.6 % - 7.5(H) - 7.5(H)   PHART 7.350 - 7.450 - - 7.357 -   PCO2ART 35.0 - 45.0 mmHg - - 42.8 -   HCO3 20.0 - 24.0 mEq/L - - 23.3 -   TCO2 0 - 100 mmol/L 26 - 24.6 29   ACIDBASEDEF 0.0 - 2.0 mmol/L - - 1.3 -   O2SAT - - - 99.9 -      Capillary Blood Glucose: Lab Results  Component Value Date   GLUCAP 260* 09/14/2015   GLUCAP 152* 09/11/2015   GLUCAP 136* 09/09/2015   GLUCAP 234* 09/04/2015   GLUCAP 152* 09/02/2015     Exercise Target Goals:    Exercise Program Goal: Individual exercise prescription set with THRR, safety & activity barriers. Participant demonstrates ability to understand and report RPE using BORG scale, to  self-measure pulse accurately, and to acknowledge the importance of the exercise prescription.  Exercise Prescription Goal: Starting with aerobic activity 30 plus minutes a day, 3 days per week for initial exercise prescription. Provide home exercise prescription and guidelines that participant acknowledges understanding prior to discharge.  Activity Barriers & Risk Stratification:     Activity Barriers & Cardiac Risk Stratification - 08/25/15 1420    Activity Barriers & Cardiac Risk Stratification   Activity Barriers Muscular Weakness;Assistive Device;Other (comment)   Comments Right sided weakness from stroke.   Cardiac Risk Stratification High      6 Minute Walk:   Initial Exercise Prescription:     Initial Exercise Prescription - 08/25/15 1700    Date of Initial Exercise RX and Referring Provider   Date 08/25/15   Referring Provider Verdis Prime III MD   Bike   Level 1   Minutes 10   METs 2.94   NuStep   Level 3   Minutes 10   METs 2.6   Track   Laps 10   Minutes 10   METs 2.74   Prescription Details   Frequency (times per week) 3   Duration Progress to 30 minutes of continuous aerobic without signs/symptoms of physical distress   Intensity   THRR 40-80% of Max Heartrate 60-121   Ratings of Perceived Exertion 11-13   Perceived Dyspnea 0-4   Progression   Progression Continue to progress workloads to maintain intensity without signs/symptoms of physical distress.   Resistance Training   Training Prescription Yes   Weight 2lbs.      Perform Capillary Blood Glucose checks as needed.  Exercise Prescription Changes:     Exercise Prescription Changes      09/11/15 1300           Exercise Review   Progression Yes       Response to Exercise   Blood Pressure (Admit) 124/70 mmHg       Blood Pressure (Exercise) 124/62 mmHg       Blood Pressure (Exit) 114/68 mmHg       Heart Rate (Admit) 79 bpm       Heart Rate (Exercise) 100 bpm       Heart Rate (Exit)  93 bpm       Rating of Perceived Exertion (Exercise) 11       Symptoms none       Comments Reviewed home exercise guidelines with patient on 09/11/15.       Duration Progress to 30 minutes of continuous aerobic without  signs/symptoms of physical distress       Intensity THRR unchanged       Progression   Progression Continue to progress workloads to maintain intensity without signs/symptoms of physical distress.       Average METs 3.4       Resistance Training   Training Prescription Yes       Weight 5 lbs.       Reps 10-12       Interval Training   Interval Training No       Bike   Level 1.2       Minutes 10       METs 3.35       NuStep   Level 4       Minutes 10       METs 2.7       Track   Laps 10       Minutes 10       METs 3.61       Home Exercise Plan   Plans to continue exercise at Home       Frequency Add 2 additional days to program exercise sessions.          Exercise Comments:     Exercise Comments      09/11/15 1358           Exercise Comments Reviewed home exercise guidelines with patient.          Discharge Exercise Prescription (Final Exercise Prescription Changes):     Exercise Prescription Changes - 09/11/15 1300    Exercise Review   Progression Yes   Response to Exercise   Blood Pressure (Admit) 124/70 mmHg   Blood Pressure (Exercise) 124/62 mmHg   Blood Pressure (Exit) 114/68 mmHg   Heart Rate (Admit) 79 bpm   Heart Rate (Exercise) 100 bpm   Heart Rate (Exit) 93 bpm   Rating of Perceived Exertion (Exercise) 11   Symptoms none   Comments Reviewed home exercise guidelines with patient on 09/11/15.   Duration Progress to 30 minutes of continuous aerobic without signs/symptoms of physical distress   Intensity THRR unchanged   Progression   Progression Continue to progress workloads to maintain intensity without signs/symptoms of physical distress.   Average METs 3.4   Resistance Training   Training Prescription Yes   Weight 5 lbs.    Reps 10-12   Interval Training   Interval Training No   Bike   Level 1.2   Minutes 10   METs 3.35   NuStep   Level 4   Minutes 10   METs 2.7   Track   Laps 10   Minutes 10   METs 3.61   Home Exercise Plan   Plans to continue exercise at Home   Frequency Add 2 additional days to program exercise sessions.      Nutrition:  Target Goals: Understanding of nutrition guidelines, daily intake of sodium 1500mg , cholesterol 200mg , calories 30% from fat and 7% or less from saturated fats, daily to have 5 or more servings of fruits and vegetables.  Biometrics:     Pre Biometrics - 08/25/15 1716    Pre Biometrics   Waist Circumference 40.75 inches   Hip Circumference 42.5 inches   Waist to Hip Ratio 0.96 %   Triceps Skinfold 19 mm   % Body Fat 29.2 %   Grip Strength 42 kg   Flexibility 8 in   Single Leg Stand 4.59 seconds  Nutrition Therapy Plan and Nutrition Goals:     Nutrition Therapy & Goals - 08/26/15 1515    Nutrition Therapy   Diet Diabetic, Therapeutic Lifestyle Changes   Personal Nutrition Goals   Personal Goal #1 0.5-2 lb wt loss/week to a goal wt loss of 6-15 lb at graduation from Cardiac Rehab   Intervention Plan   Intervention Prescribe, educate and counsel regarding individualized specific dietary modifications aiming towards targeted core components such as weight, hypertension, lipid management, diabetes, heart failure and other comorbidities.   Expected Outcomes Short Term Goal: Understand basic principles of dietary content, such as calories, fat, sodium, cholesterol and nutrients.;Long Term Goal: Adherence to prescribed nutrition plan.      Nutrition Discharge: Nutrition Scores:     Nutrition Assessments - 08/26/15 1516    MEDFICTS Scores   Pre Score 9  will verify score with pt      Nutrition Goals Re-Evaluation:   Psychosocial: Target Goals: Acknowledge presence or absence of depression, maximize coping skills, provide positive  support system. Participant is able to verbalize types and ability to use techniques and skills needed for reducing stress and depression.  Initial Review & Psychosocial Screening:     Initial Psych Review & Screening - 08/27/15 0918    Family Dynamics   Good Support System? Yes   Barriers   Psychosocial barriers to participate in program There are no identifiable barriers or psychosocial needs.   Screening Interventions   Interventions Encouraged to exercise      Quality of Life Scores:     Quality of Life - 08/25/15 1717    Quality of Life Scores   Health/Function Pre 23.47 %   Socioeconomic Pre 24.58 %   Psych/Spiritual Pre 25.75 %   Family Pre 28.8 %   GLOBAL Pre 24.94 %      PHQ-9:     Recent Review Flowsheet Data    Depression screen [facility] 2/9 08/31/2015 02/09/2015   Decreased Interest 0 2   Down, Depressed, Hopeless 0 0   PHQ - 2 Score 0 2   Altered sleeping - 0   Tired, decreased energy - 3   Change in appetite - 0   Feeling bad or failure about yourself  - 0   Trouble concentrating - 3   Moving slowly or fidgety/restless - 3   Suicidal thoughts - 0   PHQ-9 Score - 11   Difficult doing work/chores - Very difficult      Psychosocial Evaluation and Intervention:   Psychosocial Re-Evaluation:   Vocational Rehabilitation: Provide vocational rehab assistance to qualifying candidates.   Vocational Rehab Evaluation & Intervention:     Vocational Rehab - 08/25/15 1615    Initial Vocational Rehab Evaluation & Intervention   Assessment shows need for Vocational Rehabilitation No      Education: Education Goals: Education classes will be provided on a weekly basis, covering required topics. Participant will state understanding/return demonstration of topics presented.  Learning Barriers/Preferences:     Learning Barriers/Preferences - 08/25/15 1421    Learning Barriers/Preferences   Learning Barriers Reading  Since stroke, some memory deficits,  difficulty reading, and has to concentrate more to express thoughts.   Learning Preferences Verbal Instruction;Pictoral      Education Topics: Count Your Pulse:  -Group instruction provided by verbal instruction, demonstration, patient participation and written materials to support subject.  Instructors address importance of being able to find your pulse and how to count your pulse when at home without a heart  monitor.  Patients get hands on experience counting their pulse with staff help and individually.   Heart Attack, Angina, and Risk Factor Modification:  -Group instruction provided by verbal instruction, video, and written materials to support subject.  Instructors address signs and symptoms of angina and heart attacks.    Also discuss risk factors for heart disease and how to make changes to improve heart health risk factors.   Functional Fitness:  -Group instruction provided by verbal instruction, demonstration, patient participation, and written materials to support subject.  Instructors address safety measures for doing things around the house.  Discuss how to get up and down off the floor, how to pick things up properly, how to safely get out of a chair without assistance, and balance training.   Meditation and Mindfulness:  -Group instruction provided by verbal instruction, patient participation, and written materials to support subject.  Instructor addresses importance of mindfulness and meditation practice to help reduce stress and improve awareness.  Instructor also leads participants through a meditation exercise.    Stretching for Flexibility and Mobility:  -Group instruction provided by verbal instruction, patient participation, and written materials to support subject.  Instructors lead participants through series of stretches that are designed to increase flexibility thus improving mobility.  These stretches are additional exercise for major muscle groups that are typically  performed during regular warm up and cool down.   Hands Only CPR Anytime:  -Group instruction provided by verbal instruction, video, patient participation and written materials to support subject.  Instructors co-teach with AHA video for hands only CPR.  Participants get hands on experience with mannequins.          CARDIAC REHAB PHASE II EXERCISE from 09/11/2015 in [facility] CARDIAC REHAB   Date  09/11/15   Instruction Review Code  2- meets goals/outcomes      Nutrition I class: Heart Healthy Eating:  -Group instruction provided by PowerPoint slides, verbal discussion, and written materials to support subject matter. The instructor gives an explanation and review of the Therapeutic Lifestyle Changes diet recommendations, which includes a discussion on lipid goals, dietary fat, sodium, fiber, plant stanol/sterol esters, sugar, and the components of a well-balanced, healthy diet.      CARDIAC REHAB PHASE II EXERCISE from 09/11/2015 in [facility] CARDIAC REHAB   Date  09/07/15 Debbrah Alar handout given]   Educator  RD   Instruction Review Code  Not applicable      Nutrition II class: Lifestyle Skills:  -Group instruction provided by PowerPoint slides, verbal discussion, and written materials to support subject matter. The instructor gives an explanation and review of label reading, grocery shopping for heart health, heart healthy recipe modifications, and ways to make healthier choices when eating out.      CARDIAC REHAB PHASE II EXERCISE from 09/11/2015 in [facility] CARDIAC REHAB   Date  09/07/15 Debbrah Alar handouts given]   Educator  RD   Instruction Review Code  Not applicable      Diabetes Question & Answer:  -Group instruction provided by PowerPoint slides, verbal discussion, and written materials to support subject matter. The instructor gives an explanation and review of diabetes co-morbidities, pre- and post-prandial blood glucose  goals, pre-exercise blood glucose goals, signs, symptoms, and treatment of hypoglycemia and hyperglycemia, and foot care basics.      CARDIAC REHAB PHASE II EXERCISE from 09/11/2015 in [facility] CARDIAC REHAB   Date  09/04/15   Educator  RD  Instruction Review Code  2- meets goals/outcomes      Diabetes Blitz:  -Group instruction provided by PowerPoint slides, verbal discussion, and written materials to support subject matter. The instructor gives an explanation and review of the physiology behind type 1 and type 2 diabetes, diabetes medications and rational behind using different medications, pre- and post-prandial blood glucose recommendations and Hemoglobin A1c goals, diabetes diet, and exercise including blood glucose guidelines for exercising safely.    Portion Distortion:  -Group instruction provided by PowerPoint slides, verbal discussion, written materials, and food models to support subject matter. The instructor gives an explanation of serving size versus portion size, changes in portions sizes over the last 20 years, and what consists of a serving from each food group.   Stress Management:  -Group instruction provided by verbal instruction, video, and written materials to support subject matter.  Instructors review role of stress in heart disease and how to cope with stress positively.        CARDIAC REHAB PHASE II EXERCISE from 09/11/2015 in [facility] CARDIAC REHAB   Date  09/02/15   Educator  Gladstone Lighter, RN   Instruction Review Code  2- meets goals/outcomes      Exercising on Your Own:  -Group instruction provided by verbal instruction, power point, and written materials to support subject.  Instructors discuss benefits of exercise, components of exercise, frequency and intensity of exercise, and end points for exercise.  Also discuss use of nitroglycerin and activating EMS.  Review options of places to exercise outside of rehab.  Review  guidelines for sex with heart disease.   Cardiac Drugs I:  -Group instruction provided by verbal instruction and written materials to support subject.  Instructor reviews cardiac drug classes: antiplatelets, anticoagulants, beta blockers, and statins.  Instructor discusses reasons, side effects, and lifestyle considerations for each drug class.      CARDIAC REHAB PHASE II EXERCISE from 09/11/2015 in [facility] CARDIAC REHAB   Date  09/09/15   Educator  Zena Amos,   Instruction Review Code  2- meets goals/outcomes      Cardiac Drugs II:  -Group instruction provided by verbal instruction and written materials to support subject.  Instructor reviews cardiac drug classes: angiotensin converting enzyme inhibitors (ACE-I), angiotensin II receptor blockers (ARBs), nitrates, and calcium channel blockers.  Instructor discusses reasons, side effects, and lifestyle considerations for each drug class.   Anatomy and Physiology of the Circulatory System:  -Group instruction provided by verbal instruction, video, and written materials to support subject.  Reviews functional anatomy of heart, how it relates to various diagnoses, and what role the heart plays in the overall system.   Knowledge Questionnaire Score:     Knowledge Questionnaire Score - 08/25/15 1719    Knowledge Questionnaire Score   Pre Score 17/24      Core Components/Risk Factors/Patient Goals at Admission:     Personal Goals and Risk Factors at Admission - 08/25/15 1720    Core Components/Risk Factors/Patient Goals on Admission    Weight Management Yes;Weight Loss   Intervention Weight Management: Develop a combined nutrition and exercise program designed to reach desired caloric intake, while maintaining appropriate intake of nutrient and fiber, sodium and fats, and appropriate energy expenditure required for the weight goal.   Admit Weight 214 lb (97.07 kg)   Goal Weight: Short Term 208 lb (94.348 kg)   Goal  Weight: Long Term 199 lb (90.266 kg)   Expected Outcomes Weight Loss: Understanding  of general recommendations for a balanced deficit meal plan, which promotes 1-2 lb weight loss per week and includes a negative energy balance of [phone] kcal/d;Long Term: Adherence to nutrition and physical activity/exercise program aimed toward attainment of established weight goal   Increase Strength and Stamina Yes   Intervention Provide advice, education, support and counseling about physical activity/exercise needs.;Develop an individualized exercise prescription for aerobic and resistive training based on initial evaluation findings, risk stratification, comorbidities and participant's personal goals.   Expected Outcomes Achievement of increased cardiorespiratory fitness and enhanced flexibility, muscular endurance and strength shown through measurements of functional capacity and personal statement of participant.   Personal Goal Other Yes   Personal Goal Feel less tired, improve health, feel less stress on body(stronger).   Intervention Develop individualized exercise program to improve cardiorespiratory fitness.   Expected Outcomes Feel stronger as cardiorespiratory fitness and strength improve with regular exercise.      Core Components/Risk Factors/Patient Goals Review:      Goals and Risk Factor Review      09/11/15 1356           Core Components/Risk Factors/Patient Goals Review   Personal Goals Review Weight Management/Obesity;Sedentary       Review Maintaining weight, current weight is 215.2. Pt walking 30 minutes, 2 days in addition to cardiac rehab.       Expected Outcomes Exercise at least 30 minutes, 5 days/ week.          Core Components/Risk Factors/Patient Goals at Discharge (Final Review):      Goals and Risk Factor Review - 09/11/15 1356    Core Components/Risk Factors/Patient Goals Review   Personal Goals Review Weight Management/Obesity;Sedentary   Review Maintaining  weight, current weight is 215.2. Pt walking 30 minutes, 2 days in addition to cardiac rehab.   Expected Outcomes Exercise at least 30 minutes, 5 days/ week.      ITP Comments:     ITP Comments      08/25/15 1404           ITP Comments Dr. Armanda Magicraci Turner, Medical Director          Comments:  Pt is making expected progress toward personal goals after completing 9 sessions. Recommend continued exercise and life style modification education including  stress management and relaxation techniques to decrease cardiac risk profile. Alanson Alyarlette Carlton RN, BSN

## 2015-09-18 ENCOUNTER — Encounter (HOSPITAL_COMMUNITY)
Admission: RE | Admit: 2015-09-18 | Discharge: 2015-09-18 | Disposition: A | Discharge status: Home / Self Care | Payer: PPO | Source: Ambulatory Visit | Attending: Interventional Cardiology | Admitting: Interventional Cardiology

## 2015-09-18 ENCOUNTER — Encounter (HOSPITAL_COMMUNITY): Payer: PPO

## 2015-09-18 DIAGNOSIS — Z955 Presence of coronary angioplasty implant and graft: Secondary | ICD-10-CM

## 2015-09-18 DIAGNOSIS — I213 ST elevation (STEMI) myocardial infarction of unspecified site: Secondary | ICD-10-CM

## 2015-09-21 ENCOUNTER — Other Ambulatory Visit (INDEPENDENT_AMBULATORY_CARE_PROVIDER_SITE_OTHER): Payer: PPO | Admitting: *Deleted

## 2015-09-21 ENCOUNTER — Encounter (HOSPITAL_COMMUNITY)
Admission: RE | Admit: 2015-09-21 | Discharge: 2015-09-21 | Disposition: A | Discharge status: Home / Self Care | Payer: PPO | Source: Ambulatory Visit | Attending: Interventional Cardiology | Admitting: Interventional Cardiology

## 2015-09-21 DIAGNOSIS — I213 ST elevation (STEMI) myocardial infarction of unspecified site: Secondary | ICD-10-CM

## 2015-09-21 DIAGNOSIS — Z955 Presence of coronary angioplasty implant and graft: Secondary | ICD-10-CM

## 2015-09-21 LAB — HEPATIC FUNCTION PANEL
ALK PHOS: 76 U/L (ref 40–115)
ALT: 15 U/L (ref 9–46)
AST: 13 U/L (ref 10–35)
Albumin: 4.1 g/dL (ref 3.6–5.1)
BILIRUBIN DIRECT: 0.2 mg/dL (ref <=0.2)
BILIRUBIN TOTAL: 0.9 mg/dL (ref 0.2–1.2)
Indirect Bilirubin: 0.7 mg/dL (ref 0.2–1.2)
Total Protein: 6.9 g/dL (ref 6.1–8.1)

## 2015-09-21 LAB — LIPID PANEL
CHOL/HDL RATIO: 5.3 ratio — AB (ref <=5.0)
Cholesterol: 202 mg/dL — ABNORMAL HIGH (ref 125–200)
HDL: 38 mg/dL — ABNORMAL LOW (ref >=40)
LDL Cholesterol: 144 mg/dL — ABNORMAL HIGH (ref <130)
Triglycerides: 99 mg/dL (ref <150)
VLDL: 20 mg/dL (ref <30)

## 2015-09-21 LAB — GLUCOSE, CAPILLARY: GLUCOSE-CAPILLARY: 236 mg/dL — AB (ref 65–99)

## 2015-09-21 NOTE — Addendum Note (Signed)
Addended by: Tonita PhoenixBOWDEN, ROBIN K on: 09/21/2015 12:41 PM   Modules accepted: Orders

## 2015-09-23 ENCOUNTER — Encounter (HOSPITAL_COMMUNITY): Payer: PPO

## 2015-09-25 ENCOUNTER — Encounter (HOSPITAL_COMMUNITY): Payer: PPO

## 2015-09-25 ENCOUNTER — Encounter (HOSPITAL_COMMUNITY)
Admission: RE | Admit: 2015-09-25 | Discharge: 2015-09-25 | Disposition: A | Discharge status: Home / Self Care | Payer: PPO | Source: Ambulatory Visit | Attending: Interventional Cardiology | Admitting: Interventional Cardiology

## 2015-09-25 DIAGNOSIS — I213 ST elevation (STEMI) myocardial infarction of unspecified site: Secondary | ICD-10-CM

## 2015-09-25 DIAGNOSIS — Z955 Presence of coronary angioplasty implant and graft: Secondary | ICD-10-CM

## 2015-09-25 LAB — GLUCOSE, CAPILLARY: Glucose-Capillary: 292 mg/dL — ABNORMAL HIGH (ref 65–99)

## 2015-09-30 ENCOUNTER — Encounter (HOSPITAL_COMMUNITY): Payer: PPO

## 2015-10-02 ENCOUNTER — Encounter (HOSPITAL_COMMUNITY): Payer: PPO

## 2015-10-07 ENCOUNTER — Encounter (HOSPITAL_COMMUNITY)
Admission: RE | Admit: 2015-10-07 | Discharge: 2015-10-07 | Disposition: A | Discharge status: Home / Self Care | Payer: PPO | Source: Ambulatory Visit | Attending: Interventional Cardiology | Admitting: Interventional Cardiology

## 2015-10-07 DIAGNOSIS — Z79899 Other long term (current) drug therapy: Secondary | ICD-10-CM | POA: Insufficient documentation

## 2015-10-07 DIAGNOSIS — Z955 Presence of coronary angioplasty implant and graft: Secondary | ICD-10-CM | POA: Insufficient documentation

## 2015-10-07 DIAGNOSIS — Z794 Long term (current) use of insulin: Secondary | ICD-10-CM | POA: Diagnosis not present

## 2015-10-07 DIAGNOSIS — I119 Hypertensive heart disease without heart failure: Secondary | ICD-10-CM | POA: Diagnosis not present

## 2015-10-07 DIAGNOSIS — I213 ST elevation (STEMI) myocardial infarction of unspecified site: Secondary | ICD-10-CM | POA: Diagnosis not present

## 2015-10-07 DIAGNOSIS — Z7982 Long term (current) use of aspirin: Secondary | ICD-10-CM | POA: Diagnosis not present

## 2015-10-07 DIAGNOSIS — I2510 Atherosclerotic heart disease of native coronary artery without angina pectoris: Secondary | ICD-10-CM | POA: Diagnosis not present

## 2015-10-07 DIAGNOSIS — E119 Type 2 diabetes mellitus without complications: Secondary | ICD-10-CM | POA: Diagnosis not present

## 2015-10-09 ENCOUNTER — Encounter (HOSPITAL_COMMUNITY)
Admission: RE | Admit: 2015-10-09 | Discharge: 2015-10-09 | Disposition: A | Discharge status: Home / Self Care | Payer: PPO | Source: Ambulatory Visit | Attending: Interventional Cardiology | Admitting: Interventional Cardiology

## 2015-10-09 DIAGNOSIS — I213 ST elevation (STEMI) myocardial infarction of unspecified site: Secondary | ICD-10-CM

## 2015-10-09 DIAGNOSIS — Z955 Presence of coronary angioplasty implant and graft: Secondary | ICD-10-CM

## 2015-10-12 ENCOUNTER — Encounter (HOSPITAL_COMMUNITY)
Admission: RE | Admit: 2015-10-12 | Discharge: 2015-10-12 | Disposition: A | Discharge status: Home / Self Care | Payer: PPO | Source: Ambulatory Visit | Attending: Interventional Cardiology | Admitting: Interventional Cardiology

## 2015-10-12 DIAGNOSIS — Z955 Presence of coronary angioplasty implant and graft: Secondary | ICD-10-CM

## 2015-10-12 DIAGNOSIS — I213 ST elevation (STEMI) myocardial infarction of unspecified site: Secondary | ICD-10-CM | POA: Diagnosis not present

## 2015-10-12 LAB — GLUCOSE, CAPILLARY: GLUCOSE-CAPILLARY: 158 mg/dL — AB (ref 65–99)

## 2015-10-14 ENCOUNTER — Encounter (HOSPITAL_COMMUNITY)
Admission: RE | Admit: 2015-10-14 | Discharge: 2015-10-14 | Disposition: A | Discharge status: Home / Self Care | Payer: PPO | Source: Ambulatory Visit | Attending: Interventional Cardiology | Admitting: Interventional Cardiology

## 2015-10-14 DIAGNOSIS — I213 ST elevation (STEMI) myocardial infarction of unspecified site: Secondary | ICD-10-CM

## 2015-10-14 DIAGNOSIS — Z955 Presence of coronary angioplasty implant and graft: Secondary | ICD-10-CM

## 2015-10-14 LAB — GLUCOSE, CAPILLARY: Glucose-Capillary: 259 mg/dL — ABNORMAL HIGH (ref 65–99)

## 2015-10-15 ENCOUNTER — Telehealth (HOSPITAL_COMMUNITY): Payer: Self-pay | Admitting: *Deleted

## 2015-10-15 NOTE — Progress Notes (Signed)
Cardiac Individual Treatment Plan  Patient Details  Name: Jaquari Reckner MRN: [id number] Date of Birth: 07-03-1945 Referring Provider:        CARDIAC REHAB PHASE II ORIENTATION from 08/25/2015 in [facility] CARDIAC REHAB   Referring Provider  Verdis Prime III MD      Initial Encounter Date:       CARDIAC REHAB PHASE II ORIENTATION from 08/25/2015 in [facility] CARDIAC REHAB   Date  08/25/15   Referring Provider  Verdis Prime III MD      Visit Diagnosis: ST elevation myocardial infarction (STEMI), unspecified artery (HCC)  S/P coronary artery stent placement  Patient's Home Medications on Admission:  Current outpatient prescriptions:  .  amLODipine (NORVASC) 10 MG tablet, Take 1 tablet (10 mg total) by mouth daily., Disp: 30 tablet, Rfl: 1 .  aspirin EC 81 MG tablet, Take 81 mg by mouth daily., Disp: , Rfl:  .  atorvastatin (LIPITOR) 80 MG tablet, Take 1 tablet (80 mg total) by mouth daily at 6 PM., Disp: 30 tablet, Rfl: 6 .  carvedilol (COREG) 6.25 MG tablet, Take 1 tablet (6.25 mg total) by mouth 2 (two) times daily with a meal., Disp: 60 tablet, Rfl: 6 .  clopidogrel (PLAVIX) 75 MG tablet, Take 1 tablet (75 mg total) by mouth daily with breakfast., Disp: 30 tablet, Rfl: 6 .  insulin NPH Human (HUMULIN N,NOVOLIN N) 100 UNIT/ML injection, Inject into the skin. Takes 40 units in the am and 30 units in the pm, RN from [facility] call pharmacy its a different brand name, patient takes this brand t/c call made 05/13/2015, Disp: , Rfl:  .  insulin regular (NOVOLIN R,HUMULIN R) 100 units/mL injection, Inject into the skin twice daily as needed per sliding scale., Disp: , Rfl:  .  lisinopril-hydrochlorothiazide (PRINZIDE,ZESTORETIC) 20-12.5 MG tablet, Take 1 tablet by mouth 2 (two) times daily., Disp: , Rfl:  .  Multiple Vitamin (MULTIVITAMIN WITH MINERALS) TABS tablet, Take 1 tablet by mouth daily., Disp: , Rfl:  .  nitroGLYCERIN (NITROSTAT) 0.4 MG SL tablet, Place 1  tablet (0.4 mg total) under the tongue every 5 (five) minutes as needed for chest pain., Disp: 25 tablet, Rfl: 3  Past Medical History: Past Medical History  Diagnosis Date  . Diabetes mellitus without complication (HCC)   . Stroke (HCC)   . Left eye injury   . Intracranial hemorrhage (HCC)     a. 11/2014 in setting of marked HTN->3.5x2.5 left frontal ICH (12-53ml by MRI); b. 11/2014 MRA no evidence of AVM or CAA; c. 11/2014 Carotid U/S: unremarkable.  . Hypertensive heart disease   . ETOH abuse   . Diastolic dysfunction     a. 11/2014 Echo: EF 60-65%, gr1 DD, mildly dil RA.  Marland Kitchen CAD (coronary artery disease)     a. 05/2015 Inflat STEMI/PCI: LM nl, LAD 50p, Om1 100 (3.0 x 16mm Promus Premier DES), OM2/3 small, RCA 40p/d, EF 45-50%.  . H/O ventricular fibrillation     a. 05/2015 in setting of inferolateral STEMI.  . ST elevation MI (STEMI) (HCC) 06/12/15    Tobacco Use: History  Smoking status  . Never Smoker   Smokeless tobacco  . Never Used    Labs: Recent Review Flowsheet Data    Labs for ITP Cardiac and Pulmonary Rehab Latest Ref Rng 12/10/2014 12/11/2014 12/12/2014 06/12/2015 09/21/2015   Cholestrol 125 - 200 mg/dL - 409 - 811(B) 147(W)   LDLCALC <130 mg/dL - 295(A) - 213(Y) 865(H)  HDL >=40 mg/dL - 41 - 42 16(X)   Trlycerides <150 mg/dL - 096(E) - 52 99   Hemoglobin A1c 4.8 - 5.6 % - 7.5(H) - 7.5(H) -   PHART 7.350 - 7.450 - - 7.357 - -   PCO2ART 35.0 - 45.0 mmHg - - 42.8 - -   HCO3 20.0 - 24.0 mEq/L - - 23.3 - -   TCO2 0 - 100 mmol/L 26 - 24.6 29 -   ACIDBASEDEF 0.0 - 2.0 mmol/L - - 1.3 - -   O2SAT - - - 99.9 - -      Capillary Blood Glucose: Lab Results  Component Value Date   GLUCAP 259* 10/14/2015   GLUCAP 158* 10/12/2015   GLUCAP 292* 09/25/2015   GLUCAP 236* 09/21/2015   GLUCAP 260* 09/14/2015     Exercise Target Goals:    Exercise Program Goal: Individual exercise prescription set with THRR, safety & activity barriers. Participant demonstrates ability to  understand and report RPE using BORG scale, to self-measure pulse accurately, and to acknowledge the importance of the exercise prescription.  Exercise Prescription Goal: Starting with aerobic activity 30 plus minutes a day, 3 days per week for initial exercise prescription. Provide home exercise prescription and guidelines that participant acknowledges understanding prior to discharge.  Activity Barriers & Risk Stratification:     Activity Barriers & Cardiac Risk Stratification - 08/25/15 1420    Activity Barriers & Cardiac Risk Stratification   Activity Barriers Muscular Weakness;Assistive Device;Other (comment)   Comments Right sided weakness from stroke.   Cardiac Risk Stratification High      6 Minute Walk:   Initial Exercise Prescription:     Initial Exercise Prescription - 08/25/15 1700    Date of Initial Exercise RX and Referring Provider   Date 08/25/15   Referring Provider Verdis Prime III MD   Bike   Level 1   Minutes 10   METs 2.94   NuStep   Level 3   Minutes 10   METs 2.6   Track   Laps 10   Minutes 10   METs 2.74   Prescription Details   Frequency (times per week) 3   Duration Progress to 30 minutes of continuous aerobic without signs/symptoms of physical distress   Intensity   THRR 40-80% of Max Heartrate 60-121   Ratings of Perceived Exertion 11-13   Perceived Dyspnea 0-4   Progression   Progression Continue to progress workloads to maintain intensity without signs/symptoms of physical distress.   Resistance Training   Training Prescription Yes   Weight 2lbs.      Perform Capillary Blood Glucose checks as needed.  Exercise Prescription Changes:     Exercise Prescription Changes      09/11/15 1300 09/25/15 1300 10/13/15 1200       Exercise Review   Progression Yes Yes --     Response to Exercise   Blood Pressure (Admit) 124/70 mmHg 118/64 mmHg 100/69 mmHg     Blood Pressure (Exercise) 124/62 mmHg 154/78 mmHg 120/64 mmHg     Blood  Pressure (Exit) 114/68 mmHg 102/64 mmHg 110/60 mmHg     Heart Rate (Admit) 79 bpm 78 bpm 75 bpm     Heart Rate (Exercise) 100 bpm 97 bpm 89 bpm     Heart Rate (Exit) 93 bpm 87 bpm 75 bpm     Rating of Perceived Exertion (Exercise) 11 13 12      Symptoms none none none     Comments Reviewed home  exercise guidelines with patient on 09/11/15. Reviewed home exercise guidelines with patient on 09/11/15. Reviewed home exercise guidelines with patient on 09/11/15.     Duration Progress to 30 minutes of continuous aerobic without signs/symptoms of physical distress Progress to 30 minutes of continuous aerobic without signs/symptoms of physical distress Progress to 30 minutes of continuous aerobic without signs/symptoms of physical distress     Intensity THRR unchanged THRR unchanged THRR unchanged     Progression   Progression Continue to progress workloads to maintain intensity without signs/symptoms of physical distress. Continue to progress workloads to maintain intensity without signs/symptoms of physical distress. Continue to progress workloads to maintain intensity without signs/symptoms of physical distress.     Average METs 3.4 3.7 3.4     Resistance Training   Training Prescription Yes Yes Yes     Weight 5 lbs. 5 lbs. 5 lbs.     Reps 10-12 10-12 10-12     Interval Training   Interval Training No No No     Bike   Level 1.2 1.2 --     Minutes 10 10 --     METs 3.35 3.36 --     NuStep   Level 4 4 4      Minutes 10 10 10      METs 2.7 3.8 3.3     Arm Ergometer   Level   1  Utilized AE today due to leg soreness.     Minutes   10     Track   Laps 10 16 14      Minutes 10 10 10      METs 3.61 3.79 3.44     Home Exercise Plan   Plans to continue exercise at [facility]     Frequency Add 2 additional days to program exercise sessions. Add 2 additional days to program exercise sessions. Add 2 additional days to program exercise sessions.        Exercise Comments:     Exercise Comments       09/11/15 1358 09/25/15 1401 10/12/15 1226       Exercise Comments Reviewed home exercise guidelines with patient. Making steady progress with exercise. Pt having some soreness on the bike and nustep. Adjusted seat settings for comfort. Doing well otherwise, with exercise.        Discharge Exercise Prescription (Final Exercise Prescription Changes):     Exercise Prescription Changes - 10/13/15 1200    Exercise Review   Progression --   Response to Exercise   Blood Pressure (Admit) 100/69 mmHg   Blood Pressure (Exercise) 120/64 mmHg   Blood Pressure (Exit) 110/60 mmHg   Heart Rate (Admit) 75 bpm   Heart Rate (Exercise) 89 bpm   Heart Rate (Exit) 75 bpm   Rating of Perceived Exertion (Exercise) 12   Symptoms none   Comments Reviewed home exercise guidelines with patient on 09/11/15.   Duration Progress to 30 minutes of continuous aerobic without signs/symptoms of physical distress   Intensity THRR unchanged   Progression   Progression Continue to progress workloads to maintain intensity without signs/symptoms of physical distress.   Average METs 3.4   Resistance Training   Training Prescription Yes   Weight 5 lbs.   Reps 10-12   Interval Training   Interval Training No   Bike   Level --   Minutes --   METs --   NuStep   Level 4   Minutes 10   METs 3.3   Arm Ergometer  Level 1  Utilized AE today due to leg soreness.   Minutes 10   Track   Laps 14   Minutes 10   METs 3.44   Home Exercise Plan   Plans to continue exercise at Home   Frequency Add 2 additional days to program exercise sessions.      Nutrition:  Target Goals: Understanding of nutrition guidelines, daily intake of sodium 1500mg , cholesterol 200mg , calories 30% from fat and 7% or less from saturated fats, daily to have 5 or more servings of fruits and vegetables.  Biometrics:     Pre Biometrics - 08/25/15 1716    Pre Biometrics   Waist Circumference 40.75 inches   Hip Circumference 42.5  inches   Waist to Hip Ratio 0.96 %   Triceps Skinfold 19 mm   % Body Fat 29.2 %   Grip Strength 42 kg   Flexibility 8 in   Single Leg Stand 4.59 seconds       Nutrition Therapy Plan and Nutrition Goals:     Nutrition Therapy & Goals - 08/26/15 1515    Nutrition Therapy   Diet Diabetic, Therapeutic Lifestyle Changes   Personal Nutrition Goals   Personal Goal #1 0.5-2 lb wt loss/week to a goal wt loss of 6-15 lb at graduation from Cardiac Rehab   Intervention Plan   Intervention Prescribe, educate and counsel regarding individualized specific dietary modifications aiming towards targeted core components such as weight, hypertension, lipid management, diabetes, heart failure and other comorbidities.   Expected Outcomes Short Term Goal: Understand basic principles of dietary content, such as calories, fat, sodium, cholesterol and nutrients.;Long Term Goal: Adherence to prescribed nutrition plan.      Nutrition Discharge: Nutrition Scores:     Nutrition Assessments - 08/26/15 1516    MEDFICTS Scores   Pre Score 9  will verify score with pt      Nutrition Goals Re-Evaluation:   Psychosocial: Target Goals: Acknowledge presence or absence of depression, maximize coping skills, provide positive support system. Participant is able to verbalize types and ability to use techniques and skills needed for reducing stress and depression.  Initial Review & Psychosocial Screening:     Initial Psych Review & Screening - 08/27/15 0918    Family Dynamics   Good Support System? Yes   Barriers   Psychosocial barriers to participate in program There are no identifiable barriers or psychosocial needs.   Screening Interventions   Interventions Encouraged to exercise      Quality of Life Scores:     Quality of Life - 08/25/15 1717    Quality of Life Scores   Health/Function Pre 23.47 %   Socioeconomic Pre 24.58 %   Psych/Spiritual Pre 25.75 %   Family Pre 28.8 %   GLOBAL Pre 24.94  %      PHQ-9:     Recent Review Flowsheet Data    Depression screen [facility] 2/9 08/31/2015 02/09/2015   Decreased Interest 0 2   Down, Depressed, Hopeless 0 0   PHQ - 2 Score 0 2   Altered sleeping - 0   Tired, decreased energy - 3   Change in appetite - 0   Feeling bad or failure about yourself  - 0   Trouble concentrating - 3   Moving slowly or fidgety/restless - 3   Suicidal thoughts - 0   PHQ-9 Score - 11   Difficult doing work/chores - Very difficult      Psychosocial Evaluation and Intervention:  Psychosocial Re-Evaluation:   Vocational Rehabilitation: Provide vocational rehab assistance to qualifying candidates.   Vocational Rehab Evaluation & Intervention:     Vocational Rehab - 08/25/15 1615    Initial Vocational Rehab Evaluation & Intervention   Assessment shows need for Vocational Rehabilitation No      Education: Education Goals: Education classes will be provided on a weekly basis, covering required topics. Participant will state understanding/return demonstration of topics presented.  Learning Barriers/Preferences:     Learning Barriers/Preferences - 08/25/15 1421    Learning Barriers/Preferences   Learning Barriers Reading  Since stroke, some memory deficits, difficulty reading, and has to concentrate more to express thoughts.   Learning Preferences Verbal Instruction;Pictoral      Education Topics: Count Your Pulse:  -Group instruction provided by verbal instruction, demonstration, patient participation and written materials to support subject.  Instructors address importance of being able to find your pulse and how to count your pulse when at home without a heart monitor.  Patients get hands on experience counting their pulse with staff help and individually.   Heart Attack, Angina, and Risk Factor Modification:  -Group instruction provided by verbal instruction, video, and written materials to support subject.  Instructors address signs and  symptoms of angina and heart attacks.    Also discuss risk factors for heart disease and how to make changes to improve heart health risk factors.          CARDIAC REHAB PHASE II EXERCISE from 10/14/2015 in [facility] CARDIAC REHAB   Date  09/18/15   Educator  Gladstone Lighter RN   Instruction Review Code  2- meets goals/outcomes      Functional Fitness:  -Group instruction provided by verbal instruction, demonstration, patient participation, and written materials to support subject.  Instructors address safety measures for doing things around the house.  Discuss how to get up and down off the floor, how to pick things up properly, how to safely get out of a chair without assistance, and balance training.      CARDIAC REHAB PHASE II EXERCISE from 10/14/2015 in [facility] CARDIAC REHAB   Date  09/25/15   Instruction Review Code  2- meets goals/outcomes      Meditation and Mindfulness:  -Group instruction provided by verbal instruction, patient participation, and written materials to support subject.  Instructor addresses importance of mindfulness and meditation practice to help reduce stress and improve awareness.  Instructor also leads participants through a meditation exercise.    Stretching for Flexibility and Mobility:  -Group instruction provided by verbal instruction, patient participation, and written materials to support subject.  Instructors lead participants through series of stretches that are designed to increase flexibility thus improving mobility.  These stretches are additional exercise for major muscle groups that are typically performed during regular warm up and cool down.   Hands Only CPR Anytime:  -Group instruction provided by verbal instruction, video, patient participation and written materials to support subject.  Instructors co-teach with AHA video for hands only CPR.  Participants get hands on experience with mannequins.       CARDIAC REHAB PHASE II EXERCISE from 10/14/2015 in [facility] CARDIAC REHAB   Date  09/11/15   Instruction Review Code  2- meets goals/outcomes      Nutrition I class: Heart Healthy Eating:  -Group instruction provided by PowerPoint slides, verbal discussion, and written materials to support subject matter. The instructor gives an explanation and review of the Therapeutic  Lifestyle Changes diet recommendations, which includes a discussion on lipid goals, dietary fat, sodium, fiber, plant stanol/sterol esters, sugar, and the components of a well-balanced, healthy diet.      [facility]   Date  09/07/15 Debbrah Alar[class handout given]   Educator  RD   Instruction Review Code  Not applicable      Nutrition II class: Lifestyle Skills:  -Group instruction provided by PowerPoint slides, verbal discussion, and written materials to support subject matter. The instructor gives an explanation and review of label reading, grocery shopping for heart health, heart healthy recipe modifications, and ways to make healthier choices when eating out.      CARDIAC REHAB [facility]   Date  09/07/15 Debbrah Alar[class handouts given]   Educator  RD   Instruction Review Code  Not applicable      Diabetes Question & Answer:  -Group instruction provided by PowerPoint slides, verbal discussion, and written materials to support subject matter. The instructor gives an explanation and review of diabetes co-morbidities, pre- and post-prandial blood glucose goals, pre-exercise blood glucose goals, signs, symptoms, and treatment of hypoglycemia and hyperglycemia, and foot care basics.      [facility]ARDIAC RE[facility]AB [facility]   Date  09/04/15   Educator  RD   Instruction Review Code  2- meets goals/outcomes       Diabetes Blitz:  -Group instruction provided by PowerPoint slides, verbal discussion, and written materials to support subject matter. The instructor gives an explanation and review of the physiology behind type 1 and type 2 diabetes, diabetes medications and rational behind using different medications, pre- and post-prandial blood glucose recommendations and Hemoglobin A1c goals, diabetes diet, and exercise including blood glucose guidelines for exercising safely.    Portion Distortion:  -Group instruction provided by PowerPoint slides, verbal discussion, written materials, and food models to support subject matter. The instructor gives an explanation of serving size versus portion size, changes in portions sizes over the last 20 years, and what consists of a serving from each food group.   Stress Management:  -Group instruction provided by verbal instruction, video, and written materials to support subject matter.  Instructors review role of stress in heart disease and how to cope with stress positively.        [facility]ARDIAC RE[facility]AB [facility]   Date  09/02/15   Educator  Gladstone LighterMaria Whitaker, RN   Instruction Review Code  2- meets goals/outcomes      Exercising on Your Own:  -Group instruction provided by verbal instruction, power point, and written materials to support subject.  Instructors discuss benefits of exercise, components of exercise, frequency and intensity of exercise, and end points for exercise.  Also discuss use of nitroglycerin and activating EMS.  Review options of places to exercise outside of rehab.  Review guidelines for sex with heart disease.   Cardiac Drugs I:  -Group instruction provided by verbal instruction and written materials to support subject.  Instructor reviews cardiac drug classes: antiplatelets, anticoagulants, beta blockers, and statins.  Instructor discusses reasons, side effects, and lifestyle  considerations for each drug class.      CARDIAC REHAB PH[facility]   Date  09/09/15   Educator  Zena AmosJackie Roh,  Instruction Review Code  2- meets goals/outcomes      Cardiac Drugs II:  -Group instruction provided by verbal instruction and written materials to support subject.  Instructor reviews cardiac drug classes: angiotensin converting enzyme inhibitors (ACE-I), angiotensin II receptor blockers (ARBs), nitrates, and calcium channel blockers.  Instructor discusses reasons, side effects, and lifestyle considerations for each drug class.      CARDIAC REHAB PHASE II EXERCISE from 10/14/2015 in [facility] CARDIAC REHAB   Date  10/07/15   Instruction Review Code  2- meets goals/outcomes      Anatomy and Physiology of the Circulatory System:  -Group instruction provided by verbal instruction, video, and written materials to support subject.  Reviews functional anatomy of heart, how it relates to various diagnoses, and what role the heart plays in the overall system.      CARDIAC REHAB PHASE II EXERCISE from 10/14/2015 in [facility] CARDIAC REHAB   Date  10/14/15   Instruction Review Code  2- meets goals/outcomes      Knowledge Questionnaire Score:     Knowledge Questionnaire Score - 08/25/15 1719    Knowledge Questionnaire Score   Pre Score 17/24      Core Components/Risk Factors/Patient Goals at Admission:     Personal Goals and Risk Factors at Admission - 08/25/15 1720    Core Components/Risk Factors/Patient Goals on Admission    Weight Management Yes;Weight Loss   Intervention Weight Management: Develop a combined nutrition and exercise program designed to reach desired caloric intake, while maintaining appropriate intake of nutrient and fiber, sodium and fats, and appropriate energy expenditure required for the weight goal.   Admit Weight 214 lb (97.07 kg)   Goal Weight: Short  Term 208 lb (94.348 kg)   Goal Weight: Long Term 199 lb (90.266 kg)   Expected Outcomes Weight Loss: Understanding of general recommendations for a balanced deficit meal plan, which promotes 1-2 lb weight loss per week and includes a negative energy balance of [phone] kcal/d;Long Term: Adherence to nutrition and physical activity/exercise program aimed toward attainment of established weight goal   Increase Strength and Stamina Yes   Intervention Provide advice, education, support and counseling about physical activity/exercise needs.;Develop an individualized exercise prescription for aerobic and resistive training based on initial evaluation findings, risk stratification, comorbidities and participant's personal goals.   Expected Outcomes Achievement of increased cardiorespiratory fitness and enhanced flexibility, muscular endurance and strength shown through measurements of functional capacity and personal statement of participant.   Personal Goal Other Yes   Personal Goal Feel less tired, improve health, feel less stress on body(stronger).   Intervention Develop individualized exercise program to improve cardiorespiratory fitness.   Expected Outcomes Feel stronger as cardiorespiratory fitness and strength improve with regular exercise.      Core Components/Risk Factors/Patient Goals Review:      Goals and Risk Factor Review      09/11/15 1356 10/14/15 1647         Core Components/Risk Factors/Patient Goals Review   Personal Goals Review Weight Management/Obesity;Sedentary Weight Management/Obesity;Other      Review Maintaining weight, current weight is 215.2. Pt walking 30 minutes, 2 days in addition to cardiac rehab. Pt has achieved short-term wt loss goal. Current weight is 208.8. Pt still feels tired, states has good days and bads days, but knows he has to pace himself.      Expected Outcomes Exercise at least 30 minutes, 5 days/ week. Engage in consistent aerobic exercise, most days  a week to help achieve weight loss goals.         Core Components/Risk Factors/Patient Goals at Discharge (Final Review):      Goals and Risk Factor Review - 10/14/15 1647    Core Components/Risk Factors/Patient Goals Review   Personal Goals Review Weight Management/Obesity;Other   Review Pt has achieved short-term wt loss goal. Current weight is 208.8. Pt still feels tired, states has good days and bads days, but knows he has to pace himself.   Expected Outcomes Engage in consistent aerobic exercise, most days a week to help achieve weight loss goals.      ITP Comments:     ITP Comments      08/25/15 1404           ITP Comments Dr. Armanda Magic, Medical Director          Comments: Pt is making expected progress toward personal goals after completing  15 sessions. Pt demonstrates positive and healthy coping skills.  Pt with no psychosocial needs.  No further intervention warranted at this time.  Pt with good support system. Recommend continued exercise and life style modification education including  stress management and relaxation techniques to decrease cardiac risk profile. Alanson Aly, BSN

## 2015-10-15 NOTE — Telephone Encounter (Signed)
Called to inquire about pt well being.  Pt in previous day for exercise and seems a bit more sluggish than usual. Pt remarked that he did not sleep well the night before.  Pt stated when I don't sleep as well my symptoms from stroke are more pronounced.  Pt presently was in the bed resting.  Pt was up at 7 had breakfast and returned to bed.  Pt wife didn't note any difference in his behavior at home.  Pt wife concerned about his lack of exercise and activity on his off days from rehab.  Pt stated it is too hot to exercise outside.  Will follow up with next exercise session Karlene Linemanarlette Carlton RN, BSN

## 2015-10-16 ENCOUNTER — Encounter (HOSPITAL_COMMUNITY)
Admission: RE | Admit: 2015-10-16 | Discharge: 2015-10-16 | Disposition: A | Discharge status: Home / Self Care | Payer: PPO | Source: Ambulatory Visit | Attending: Interventional Cardiology | Admitting: Interventional Cardiology

## 2015-10-16 DIAGNOSIS — Z955 Presence of coronary angioplasty implant and graft: Secondary | ICD-10-CM

## 2015-10-16 DIAGNOSIS — I213 ST elevation (STEMI) myocardial infarction of unspecified site: Secondary | ICD-10-CM

## 2015-10-16 LAB — GLUCOSE, CAPILLARY: Glucose-Capillary: 230 mg/dL — ABNORMAL HIGH (ref 65–99)

## 2015-10-19 ENCOUNTER — Encounter (HOSPITAL_COMMUNITY)
Admission: RE | Admit: 2015-10-19 | Discharge: 2015-10-19 | Disposition: A | Discharge status: Home / Self Care | Payer: PPO | Source: Ambulatory Visit | Attending: Interventional Cardiology | Admitting: Interventional Cardiology

## 2015-10-19 DIAGNOSIS — Z955 Presence of coronary angioplasty implant and graft: Secondary | ICD-10-CM

## 2015-10-19 DIAGNOSIS — I213 ST elevation (STEMI) myocardial infarction of unspecified site: Secondary | ICD-10-CM | POA: Diagnosis not present

## 2015-10-19 NOTE — Addendum Note (Signed)
Encounter addended by: Artist Pais on: 10/19/2015  2:22 PM<BR>    Actions taken: Visit Navigator Flowsheet section accepted

## 2015-10-23 ENCOUNTER — Encounter (HOSPITAL_COMMUNITY)
Admission: RE | Admit: 2015-10-23 | Discharge: 2015-10-23 | Disposition: A | Discharge status: Home / Self Care | Payer: PPO | Source: Ambulatory Visit | Attending: Interventional Cardiology | Admitting: Interventional Cardiology

## 2015-10-23 VITALS — BP 98/66 | HR 77 | Ht 71.5 in | Wt 211.4 lb

## 2015-10-23 DIAGNOSIS — I213 ST elevation (STEMI) myocardial infarction of unspecified site: Secondary | ICD-10-CM

## 2015-10-23 DIAGNOSIS — Z955 Presence of coronary angioplasty implant and graft: Secondary | ICD-10-CM

## 2015-10-23 NOTE — Progress Notes (Signed)
Discharge Summary  Patient Details  Name: Richard [name] MRN: [id number] Date of Birth: 10/19/1945 Referring Provider:   Flowsheet Row CARDIAC REHAB PHASE II ORIENTATION from 08/25/2015 in [facility]  Referring Provider  Daneen Schick III MD       Number of Visits: 18  Reason for Discharge:  Early Exit Pt felt he was ready to continue exercising on his own at the [facility] center.  Smoking History:  History  Smoking Status  . Never Smoker  Smokeless Tobacco  . Never Used    Diagnosis:  ST elevation myocardial infarction (STEMI), unspecified artery (HCC)  S/P coronary artery stent placement  ADL UCSD:   Initial Exercise Prescription:     Initial Exercise Prescription - 08/25/15 1700      Date of Initial Exercise RX and Referring Provider   Date 08/25/15   Referring Provider Daneen Schick III MD     Bike   Level 1   Minutes 10   METs 2.94     NuStep   Level 3   Minutes 10   METs 2.6     Track   Laps 10   Minutes 10   METs 2.74     Prescription Details   Frequency (times per week) 3   Duration Progress to 30 minutes of continuous aerobic without signs/symptoms of physical distress     Intensity   THRR 40-80% of Max Heartrate 60-121   Ratings of Perceived Exertion 11-13   Perceived Dyspnea 0-4     Progression   Progression Continue to progress workloads to maintain intensity without signs/symptoms of physical distress.     Resistance Training   Training Prescription Yes   Weight 2lbs.      Discharge Exercise Prescription (Final Exercise Prescription Changes):     Exercise Prescription Changes - 10/26/15 0700      Response to Exercise   Blood Pressure (Admit) 98/66   Blood Pressure (Exercise) 152/64   Blood Pressure (Exit) 112/76   Heart Rate (Admit) 77 bpm   Heart Rate (Exercise) 95 bpm   Heart Rate (Exit) 73 bpm   Rating of Perceived Exertion (Exercise) 13   Symptoms none   Comments Reviewed home exercise  guidelines with patient on 09/11/15.   Duration Progress to 30 minutes of continuous aerobic without signs/symptoms of physical distress   Intensity THRR unchanged     Progression   Progression Continue to progress workloads to maintain intensity without signs/symptoms of physical distress.   Average METs 3.4     Resistance Training   Training Prescription Yes   Weight 5 lbs.   Reps 10-12     Interval Training   Interval Training No     NuStep   Level 4   Minutes 10   METs 2.9     Arm Ergometer   Level --   Minutes --     Track   Laps 10   Minutes 10   METs 2.74     Home Exercise Plan   Plans to continue exercise at Home   Frequency Add 2 additional days to program exercise sessions.      Functional Capacity:     6 Minute Walk    Row Name 10/19/15 1420 10/19/15 1423       6 Minute Walk   Phase Initial Discharge    Distance 1449 feet 1652 feet    Distance % Change  - 14.01 %    Walk Time 6 minutes  6 minutes    # of Rest Breaks 0 0    MPH 2.74 3.13    METS 3.01 3.22    RPE 11 11    VO2 Peak 10.54 11.28    Symptoms No No    Resting HR 85 bpm 77 bpm    Resting BP 130/70 110/73    Max Ex. HR 98 bpm 94 bpm    Max Ex. BP 122/60 100/58    2 Minute Post BP 129/79  -       Psychological, QOL, Others - Outcomes: PHQ 2/9: Depression screen [facility] 2/9 10/23/2015 08/31/2015 02/09/2015  Decreased Interest 0 0 2  Down, Depressed, Hopeless 0 0 0  PHQ - 2 Score 0 0 2  Altered sleeping - - 0  Tired, decreased energy - - 3  Change in appetite - - 0  Feeling bad or failure about yourself  - - 0  Trouble concentrating - - 3  Moving slowly or fidgety/restless - - 3  Suicidal thoughts - - 0  PHQ-9 Score - - 11  Difficult doing work/chores - - Very difficult    Quality of Life:     Quality of Life - 10/23/15 1115      Quality of Life Scores   Health/Function Pre 23.47 %   Health/Function Post 24.25 %   Health/Function % Change 3.32 %   Socioeconomic Pre 24.58 %    Socioeconomic Post 27.14 %   Socioeconomic % Change  10.41 %   Psych/Spiritual Pre 25.75 %   Psych/Spiritual Post 30 %   Psych/Spiritual % Change 16.5 %   Family Pre 28.8 %   Family Post 28.5 %   Family % Change -1.04 %   GLOBAL Pre 24.94 %   GLOBAL Post 26.6 %   GLOBAL % Change 6.66 %      Personal Goals: Goals established at orientation with interventions provided to work toward goal.     Personal Goals and Risk Factors at Admission - 08/25/15 1720      Core Components/Risk Factors/Patient Goals on Admission    Weight Management Yes;Weight Loss   Intervention Weight Management: Develop a combined nutrition and exercise program designed to reach desired caloric intake, while maintaining appropriate intake of nutrient and fiber, sodium and fats, and appropriate energy expenditure required for the weight goal.   Admit Weight 214 lb (97.1 kg)   Goal Weight: Short Term 208 lb (94.3 kg)   Goal Weight: Long Term 199 lb (90.3 kg)   Expected Outcomes Weight Loss: Understanding of general recommendations for a balanced deficit meal plan, which promotes 1-2 lb weight loss per week and includes a negative energy balance of [phone] kcal/d;Long Term: Adherence to nutrition and physical activity/exercise program aimed toward attainment of established weight goal   Increase Strength and Stamina Yes   Intervention Provide advice, education, support and counseling about physical activity/exercise needs.;Develop an individualized exercise prescription for aerobic and resistive training based on initial evaluation findings, risk stratification, comorbidities and participant's personal goals.   Expected Outcomes Achievement of increased cardiorespiratory fitness and enhanced flexibility, muscular endurance and strength shown through measurements of functional capacity and personal statement of participant.   Personal Goal Other Yes   Personal Goal Feel less tired, improve health, feel less stress on  body(stronger).   Intervention Develop individualized exercise program to improve cardiorespiratory fitness.   Expected Outcomes Feel stronger as cardiorespiratory fitness and strength improve with regular exercise.       Personal  Goals Discharge:     Goals and Risk Factor Review    Row Name 09/11/15 1356 10/14/15 1647 10/23/15 0935         Core Components/Risk Factors/Patient Goals Review   Personal Goals Review Weight Management/Obesity;Sedentary Weight Management/Obesity;Other Weight Management/Obesity;Other     Review Maintaining weight, current weight is 215.2. Pt walking 30 minutes, 2 days in addition to cardiac rehab. Pt has achieved short-term wt loss goal. Current weight is 208.8. Pt still feels tired, states has good days and bads days, but knows he has to pace himself. Pt lost 3.5 lbs. Plans to continue exercise in the [facility] program.     Expected Outcomes Exercise at least 30 minutes, 5 days/ week. Engage in consistent aerobic exercise, most days a week to help achieve weight loss goals. Engage in consistent aerobic exercise, most days a week to help achieve weight loss goals.        Nutrition & Weight - Outcomes:     Pre Biometrics - 08/25/15 1716      Pre Biometrics   Waist Circumference 40.75 inches   Hip Circumference 42.5 inches   Waist to Hip Ratio 0.96 %   Triceps Skinfold 19 mm   % Body Fat 29.2 %   Grip Strength 42 kg   Flexibility 8 in   Single Leg Stand 4.59 seconds         Post Biometrics - 10/23/15 1115       Post  Biometrics   Height 5' 11.5" (1.816 m)   Weight 211 lb 6.7 oz (95.9 kg)   Waist Circumference 39 inches   Hip Circumference 41.75 inches   Waist to Hip Ratio 0.93 %   BMI (Calculated) 29.1   Triceps Skinfold 20 mm   % Body Fat 28.4 %   Grip Strength 38.5 kg   Flexibility 8 in   Single Leg Stand 1.56 seconds      Nutrition:     Nutrition Therapy & Goals - 08/26/15 1515      Nutrition Therapy   Diet Diabetic,  Therapeutic Lifestyle Changes     Personal Nutrition Goals   Personal Goal #1 0.5-2 lb wt loss/week to a goal wt loss of 6-15 lb at graduation from [facility], educate and counsel regarding individualized specific dietary modifications aiming towards targeted core components such as weight, hypertension, lipid management, diabetes, heart failure and other comorbidities.   Expected Outcomes Short Term Goal: Understand basic principles of dietary content, such as calories, fat, sodium, cholesterol and nutrients.;Long Term Goal: Adherence to prescribed nutrition plan.      Nutrition Discharge:     Nutrition Assessments - 11/06/15 1033      MEDFICTS Scores   Pre Score 9   Post Score --  No post score available at this time      Education Questionnaire Score:     Knowledge Questionnaire Score - 10/23/15 1115      Knowledge Questionnaire Score   Pre Score 17/24   Post Score 21/14      Goals reviewed with patient. Pt graduated from cardiac rehab program today with completion of 18 exercise sessions in Phase II. Pt maintained good attendance to exercise and education classes. Pt  progressed nicely during his participation in rehab as evidenced by increased MET level.   Medication list reconciled. Repeat  PHQ score-0. Pt with good support at home. Pt with no psychosocial needs at this time  no further intervention needed. Pt has made significant lifestyle changes and should be commended for his success. Pt feels he has made progress toward  his goals during cardiac rehab. Pt feels less tired and is increasing his stamina. Pt would benefit from periodic check in to assess his consistency with exercise. Pt benefited socially from being in a group setting.  Pt plans to continue exercise at the [facility] three times a week alternating with walking at home. Cherre Huger, BSN

## 2015-10-28 DIAGNOSIS — I69322 Dysarthria following cerebral infarction: Secondary | ICD-10-CM | POA: Diagnosis not present

## 2015-10-28 DIAGNOSIS — I252 Old myocardial infarction: Secondary | ICD-10-CM | POA: Diagnosis not present

## 2015-10-28 DIAGNOSIS — Z125 Encounter for screening for malignant neoplasm of prostate: Secondary | ICD-10-CM | POA: Diagnosis not present

## 2015-10-28 DIAGNOSIS — I10 Essential (primary) hypertension: Secondary | ICD-10-CM | POA: Diagnosis not present

## 2015-10-28 DIAGNOSIS — Z1389 Encounter for screening for other disorder: Secondary | ICD-10-CM | POA: Diagnosis not present

## 2015-10-28 DIAGNOSIS — E109 Type 1 diabetes mellitus without complications: Secondary | ICD-10-CM | POA: Diagnosis not present

## 2015-10-28 DIAGNOSIS — I69319 Unspecified symptoms and signs involving cognitive functions following cerebral infarction: Secondary | ICD-10-CM | POA: Diagnosis not present

## 2015-10-28 DIAGNOSIS — Z0000 Encounter for general adult medical examination without abnormal findings: Secondary | ICD-10-CM | POA: Diagnosis not present

## 2015-10-28 DIAGNOSIS — E7800 Pure hypercholesterolemia, unspecified: Secondary | ICD-10-CM | POA: Diagnosis not present

## 2015-11-06 ENCOUNTER — Encounter (HOSPITAL_COMMUNITY): Payer: Self-pay | Admitting: *Deleted

## 2015-12-30 ENCOUNTER — Telehealth: Payer: Self-pay | Admitting: Interventional Cardiology

## 2015-12-30 NOTE — Telephone Encounter (Signed)
New message  Pt call to schedule his f/u appt for nov. No appts available in nov with Dr. Katrinka BlazingSmith. I offered and appt with a PA. Pt was just confused about seeing PA . I did let pt know it was okay for him to be seen by a PA. Pt insisted on speaking with RN. Please call back to discuss

## 2015-12-30 NOTE — Telephone Encounter (Signed)
Spoke with pt and he wanted to make sure it was ok for him to see a PA/NP instead of Dr. Katrinka BlazingSmith.  Advised pt that it would be fine for him to see an APP.  Scheduled pt to see Nada BoozerLaura Ingold, NP on 11/8.  Pt verbalized understanding and was in agreement with this plan.

## 2016-02-02 NOTE — Progress Notes (Signed)
Cardiology Office Note   Date:  02/03/2016   ID:  Richard [name], DOB 12-28-1945, MRN [id number]  PCP:  Lolita Patella, MD  Cardiologist:  Dr. Katrinka Blazing    Chief Complaint  Patient presents with  . Coronary Artery Disease    hx MI, no chest pain      History of Present Illness: Richard [name] is a 70 y.o. male who presents for CAD with hx of MI.    He has a history of coronary artery disease, inferoposterior acute myocardial infarction in March 2017, history of intracranial hemorrhage 2016, history of EtOH abuse, and history of ventricular fibrillation during inferolateral MI. The patient is also diabetic. LDL in June 2017 144.    Today he has no complaints, no chest pain and no SOB.  He continues to exercise despite graduating from cardiac rehab.  His diet is low fat ie: no fired foods.  He takes his meds appropriately and his diabetes is stable with last Hgb A1c of 6.9.     Past Medical History:  Diagnosis Date  . CAD (coronary artery disease)    a. 05/2015 Inflat STEMI/PCI: LM nl, LAD 50p, Om1 100 (3.0 x 16mm Promus Premier DES), OM2/3 small, RCA 40p/d, EF 45-50%.  . Diabetes mellitus without complication (HCC)   . Diastolic dysfunction    a. 11/2014 Echo: EF 60-65%, gr1 DD, mildly dil RA.  . ETOH abuse   . H/O ventricular fibrillation    a. 05/2015 in setting of inferolateral STEMI.  . Hypertensive heart disease   . Intracranial hemorrhage (HCC)    a. 11/2014 in setting of marked HTN->3.5x2.5 left frontal ICH (12-29ml by MRI); b. 11/2014 MRA no evidence of AVM or CAA; c. 11/2014 Carotid U/S: unremarkable.  . Left eye injury   . ST elevation MI (STEMI) (HCC) 06/12/15  . Stroke [facility])     Past Surgical History:  Procedure Laterality Date  . CARDIAC CATHETERIZATION N/A 06/12/2015   Procedure: Left Heart Cath and Coronary Angiography;  Surgeon: Lyn Records, MD;  Location: [facility] INVASIVE CV LAB;  Service: Cardiovascular;  Laterality: N/A;  . CORONARY ANGIOPLASTY       Current  Outpatient Prescriptions  Medication Sig Dispense Refill  . amLODipine (NORVASC) 10 MG tablet Take 1 tablet (10 mg total) by mouth daily. 30 tablet 1  . aspirin EC 81 MG tablet Take 81 mg by mouth daily.    Marland Kitchen atorvastatin (LIPITOR) 80 MG tablet Take 1 tablet (80 mg total) by mouth daily at 6 PM. 30 tablet 6  . carvedilol (COREG) 6.25 MG tablet Take 1 tablet (6.25 mg total) by mouth 2 (two) times daily with a meal. 60 tablet 6  . clopidogrel (PLAVIX) 75 MG tablet Take 1 tablet (75 mg total) by mouth daily with breakfast. 30 tablet 6  . insulin NPH Human (HUMULIN N,NOVOLIN N) 100 UNIT/ML injection Inject into the skin. Takes 40 units in the am and 30 units in the pm, RN from [facility] call pharmacy its a different brand name, patient takes this brand t/c call made 05/13/2015    . insulin regular (NOVOLIN R,HUMULIN R) 100 units/mL injection Inject into the skin twice daily as needed per sliding scale.    Marland Kitchen lisinopril-hydrochlorothiazide (PRINZIDE,ZESTORETIC) 20-12.5 MG tablet Take 2 tablets by mouth daily.     . Multiple Vitamin (MULTIVITAMIN WITH MINERALS) TABS tablet Take 1 tablet by mouth daily.    . nitroGLYCERIN (NITROSTAT) 0.4 MG SL tablet Place 1 tablet (0.4 mg total) under  the tongue every 5 (five) minutes as needed for chest pain. 25 tablet 3   No current facility-administered medications for this visit.     Allergies:   Patient has no known allergies.    Social History:  The patient  reports that he has never smoked. He has never used smokeless tobacco. He reports that he does not drink alcohol.   Family History:  The patient's family history includes Healthy in his mother; Other in his father; Stroke in his maternal grandmother.    ROS:  General:no colds or fevers, + weight loss he has been trying Skin:no rashes or ulcers HEENT:no blurred vision, no congestion, chronic Lt eye droop CV:see HPI PUL:see HPI GI:no diarrhea constipation or melena, no indigestion GU:no hematuria, no  dysuria MS:no joint pain, no claudication Neuro:no syncope, no lightheadedness Endo:+ diabetes stable, no thyroid disease  Wt Readings from Last 3 Encounters:  02/03/16 199 lb 12.8 oz (90.6 kg)  10/23/15 211 lb 6.7 oz (95.9 kg)  08/25/15 214 lb 15.2 oz (97.5 kg)     PHYSICAL EXAM: VS:  BP 128/64   Pulse 82   Ht 5' 11.5" (1.816 m)   Wt 199 lb 12.8 oz (90.6 kg)   BMI 27.48 kg/m  , BMI Body mass index is 27.48 kg/m. General:Pleasant affect, NAD Skin:Warm and dry, brisk capillary refill HEENT:normocephalic, sclera clear, mucus membranes moist Neck:supple, no JVD, no bruits  Heart:S1S2 RRR without murmur, gallup, rub or click Lungs:clear without rales, rhonchi, or wheezes QMV:HQIOAbd:soft, non tender, + BS, do not palpate liver spleen or masses Ext:no lower ext edema, 2+ pedal pulses, 2+ radial pulses Neuro:alert and oriented X 3, MAE, follows commands, + facial symmetry    EKG:  EKG is NOT ordered today.   Recent Labs: 06/12/2015: B Natriuretic Peptide 17.6 06/14/2015: Hemoglobin 12.4; Platelets 215 06/22/2015: BUN 15; Creat 1.15; Potassium 4.4; Sodium 137 09/21/2015: ALT 15    Lipid Panel    Component Value Date/Time   CHOL 202 (H) 09/21/2015 1241   TRIG 99 09/21/2015 1241   HDL 38 (L) 09/21/2015 1241   CHOLHDL 5.3 (H) 09/21/2015 1241   VLDL 20 09/21/2015 1241   LDLCALC 144 (H) 09/21/2015 1241       Other studies Reviewed: Additional studies/ records that were reviewed today include: . Echo 11/2014 Study Conclusions  - Left ventricle: The cavity size was normal. Wall thickness was   increased in a pattern of mild LVH. Systolic function was normal.   The estimated ejection fraction was in the range of 60% to 65%.   Wall motion was normal; there were no regional wall motion   abnormalities. Doppler parameters are consistent with abnormal   left ventricular relaxation (grade 1 diastolic dysfunction). The   E/e&' ratio is <8, suggesting normal LV filling pressure. -  Left atrium: The atrium was normal in size. - Right atrium: The atrium was mildly dilated.  Impressions:  - LVEF 60-65%, mild LVH, normal wall motion, diastolic dysfunction,   normal LV filling pressure, mild RAE.  Cardiac cath and PCI 05/2015: 1. Prox RCA to Dist RCA lesion, 40% stenosed. 2. 1st Mrg lesion, 100% stenosed. 3. Prox LAD to Mid LAD lesion, 50% stenosed.    Acute inferolateral myocardial infarction due to occlusion of a very large first obtuse marginal in the mid segment.  Moderate proximal to mid LAD disease less than 60% obstructive. Luminal irregularities throughout the proximal and mid right coronary.  Mid inferior wall akinesis. EF 40-50%.  Successful angioplasty  followed by stenting of the totally occluded obtuse marginal bifurcation stenosis with implantation of a single drug-eluting stent across the smaller branch reducing 100% stenosis to 0% with TIMI grade 3 flow. Final post dilation balloon diameter 3.25 mm. Angioplasty was performed on the smaller obtuse marginal branch prior to stenting.  RECOMMENDATIONS:   Given history of relatively recent intracerebral hemorrhage, Plavix was administered, 600 mg.   Cangrelor IV bolus and infusion for 2 hours will be used.  Intravenous heparin was discontinued.  Frequent neurological screening for development of headache or other evidence of intracranial bleeding.  Aspirin and Plavix 6 months.   ASSESSMENT AND PLAN:     Coronary disease with hx of MI and stent is stable without recurrence of angina. He has moderate residual disease and other vascular territories that we are managing with medical therapy.  He will follow up with Dr. Katrinka BlazingSmith in 6 months unless problems and he will call to be seen.   Essential hypertension Excellent blood pressure control.  With his history of diabetes, our goal is 130/90 mmHg or less   Hyperlipidemia -Lipids are being managed by his primary care physician last LDL is 144 while  improved our goal is <70.  We discussed Zetia but pt did not wish to add meds currently.  He may benefit from lipid clinic in the future..  Tentative increased risk of recurrent bleeding on dual antiplatelet therapy. Dr. Katrinka BlazingSmith on last visit noted that we may be able to discontinue Plavix in September if no recurrent ischemic events. I will send message to Dr. Katrinka BlazingSmith to see if we should stop with his hx of intercranial bleed.   DM-2 followed by PCP and controlled  intercerebral bleed -pt to see Dr. Pearlean BrownieSethi next week.    Current medicines are reviewed with the patient today.  The patient Has no concerns regarding medicines.  The following changes have been made:  See above Labs/ tests ordered today include:see above  Disposition:   FU:  see above  Signed, Nada BoozerLaura Pecola Haxton, NP  02/03/2016 8:15 AM    [facility] [address], [place], [place]  [address]/ [address] [place], [place] Phone: [phone]; Fax: [phone]  [phone]

## 2016-02-03 ENCOUNTER — Ambulatory Visit (INDEPENDENT_AMBULATORY_CARE_PROVIDER_SITE_OTHER): Payer: PPO | Admitting: Cardiology

## 2016-02-03 ENCOUNTER — Encounter (INDEPENDENT_AMBULATORY_CARE_PROVIDER_SITE_OTHER): Payer: Self-pay

## 2016-02-03 ENCOUNTER — Encounter: Payer: Self-pay | Admitting: Cardiology

## 2016-02-03 VITALS — BP 128/64 | HR 82 | Ht 71.5 in | Wt 199.8 lb

## 2016-02-03 DIAGNOSIS — I2510 Atherosclerotic heart disease of native coronary artery without angina pectoris: Secondary | ICD-10-CM

## 2016-02-03 DIAGNOSIS — I619 Nontraumatic intracerebral hemorrhage, unspecified: Secondary | ICD-10-CM

## 2016-02-03 DIAGNOSIS — E782 Mixed hyperlipidemia: Secondary | ICD-10-CM

## 2016-02-03 DIAGNOSIS — I10 Essential (primary) hypertension: Secondary | ICD-10-CM

## 2016-02-03 DIAGNOSIS — I252 Old myocardial infarction: Secondary | ICD-10-CM

## 2016-02-03 NOTE — Patient Instructions (Addendum)
Medication Instructions:  Your physician recommends that you continue on your current medications as directed. Please refer to the Current Medication list given to you today.   Labwork: None ordered  Testing/Procedures: None ordered  Follow-Up: Your physician wants you to follow-up in: 6 MONTHS WITH DR. SMITH You will receive a reminder letter in the mail two months in advance. If you don't receive a letter, please call our office to schedule the follow-up appointment.   Any Other Special Instructions Will Be Listed Below (If Applicable).     If you need a refill on your cardiac medications before your next appointment, please call your pharmacy.   

## 2016-02-10 ENCOUNTER — Ambulatory Visit (INDEPENDENT_AMBULATORY_CARE_PROVIDER_SITE_OTHER): Payer: PPO | Admitting: Adult Health

## 2016-02-10 ENCOUNTER — Encounter: Payer: Self-pay | Admitting: Adult Health

## 2016-02-10 VITALS — BP 104/70 | HR 71 | Wt 201.0 lb

## 2016-02-10 DIAGNOSIS — R479 Unspecified speech disturbances: Secondary | ICD-10-CM | POA: Diagnosis not present

## 2016-02-10 DIAGNOSIS — Z8673 Personal history of transient ischemic attack (TIA), and cerebral infarction without residual deficits: Secondary | ICD-10-CM | POA: Diagnosis not present

## 2016-02-10 NOTE — Progress Notes (Signed)
I agree with the assessment and plan as directed by NP .The patient is known to me .   Macaulay Reicher, MD  

## 2016-02-10 NOTE — Patient Instructions (Addendum)
Continue aspirin for stroke prevention. Blood pressure goal less than 130/90 Cholesterol LDL less than 70 Hemoglobin A1c less than 6.5% If you have any stroke like symptoms please call 911 or go to the emergency room. Referral placed for speech therapy If your symptoms worsen or you develop new symptoms please let us know.

## 2016-02-10 NOTE — Progress Notes (Signed)
PATIENT: Richard JakschLeon [name] DOB: May 21, 1945  REASON FOR VISIT: follow up HISTORY FROM: patient  HISTORY OF PRESENT ILLNESS: Richard [name] is a 70 year old [name] with a history of intracranial hemorrhage and September 2016. He returns today for follow-up. He is currently on aspirin and Plavix. He states that he is tolerating this medication well. Plavix was added to his regimen at the last visit due to myocardial infarction. He states that he recently followed up with his cardiologist who advised that in the future they will consider discontinuing Plavix. Patient's blood pressure today is in normal range. He does have regular visits with his cardiologist. They do check blood work regularly. The patient is not diabetic. He does not smoke cigarettes. He denies any additional strokelike symptoms. The patient does state that his speech  improved with rehabilitation however since that has ended his speech has slowly gotten worse again. He states this has not been a sudden change. He reports that he also has numbness in the right arm that has been present since his stroke. He reports that both of these symptoms were present when he had the repeat scan in March 2017. He denies any new neurological symptoms. He returns today for an evaluation.  Update 08/11/2015: Richard [name] is a 70 year old [name] with a history of intracranial hemorrhage in September 2016. He returns today for follow-up. The patient is currently on aspirin. He was recently started on Plavix following a myocardial infarction. He states that he is tolerating these medications well. His blood pressure is in normal range today. His cardiologist is managing his hypertension. His LDL was elevated in March however he is on Lipitor. He has a follow-up appointment with his cardiologist on Friday. He states that since the stroke he continues to have discomfort in the shoulders greater on the right. He states that the symptoms were not present until after his stroke.  The patient has not tried taking any medication for this discomfort. He denies any new strokelike symptoms. He had a repeat MRI in March that showed expected changes. He returns today for an evaluation.  HISTORY 05/12/15 (Richard [name]): [name] seen today for the first office follow-up visit following intracerebral hemorrhage in September 2016 Richard [name] is a 70 y.o. [name], right handed, with a past medical history significant for DM, stroke without residual deficits, s/p artificial left eye, brought in for evaluation of the aforementioned symptoms/signs.  Patient `s son at the bedside indicated that his father was last seen normal around 11 pm Night prior to admission, but when he woke up Next morning he was not able to speak fluently and he noticed weakness of the right side of his face, and thus he became concerned and brought patient to the ED. Upon arrival to the ED, patient was described as awake and alert but aphasic with right face weakness and SBP>200. Urgent CT brain was obtained, personally reviewed, and showed evidence of a lobar left frontal ICH measuring 3.5 x 2.5 cm transverse, with an estimated ICH volume of approximately of 17.5 ml. There was mild surrounding vasogenic edema but no IV extension or hydrocephalus.  He was started on nicardipine drip as his BP remains >190. Serologies reviewed: PTT 27, INR 1.02, platelets 238. He was admitted to the intensive care unit with white blood pressure control, close neurological monitoring and placed on alcohol withdrawal precautions. Follow-up CT scan showed stable appearance of the hematoma without any extension normal expansion. Carotid Doppler showed no significant expectoration stenosis. Transthoracic echo  showed normal ejection fraction. LDL cholesterol was 102 mg percent and hemoglobin A1c was 7.5. Patient did well and was transferred to the floor and subsequently to inpatient rehabilitation. He has done well and states his  made near complete neurological recovery. He still has some diminished fine motor skills in his right hand as well as his right leg tires easily. He also has some occasional word finding difficulties particularly when he gets excited and tries to talk fast. He has finished inpatient rehabilitation as well as home therapies and is currently participating in outpatient speech therapy. He recently finished outpatient physical and occupational therapies. He states his blood pressure is well controlled and today it is 141/87. He has no new complaints today.   REVIEW OF SYSTEMS: Out of a complete 14 system review of symptoms, the patient complains only of the following symptoms, and all other reviewed systems are negative.  ALLERGIES: No Known Allergies  HOME MEDICATIONS: Outpatient Medications Prior to Visit  Medication Sig Dispense Refill  . amLODipine (NORVASC) 10 MG tablet Take 1 tablet (10 mg total) by mouth daily. 30 tablet 1  . aspirin EC 81 MG tablet Take 81 mg by mouth daily.    Marland Kitchen atorvastatin (LIPITOR) 80 MG tablet Take 1 tablet (80 mg total) by mouth daily at 6 PM. 30 tablet 6  . carvedilol (COREG) 6.25 MG tablet Take 1 tablet (6.25 mg total) by mouth 2 (two) times daily with a meal. 60 tablet 6  . clopidogrel (PLAVIX) 75 MG tablet Take 1 tablet (75 mg total) by mouth daily with breakfast. 30 tablet 6  . insulin NPH Human (HUMULIN N,NOVOLIN N) 100 UNIT/ML injection Inject into the skin. Takes 40 units in the am and 30 units in the pm, RN from [facility] call pharmacy its a different brand name, patient takes this brand t/c call made 05/13/2015    . insulin regular (NOVOLIN R,HUMULIN R) 100 units/mL injection Inject into the skin twice daily as needed per sliding scale.    Marland Kitchen lisinopril-hydrochlorothiazide (PRINZIDE,ZESTORETIC) 20-12.5 MG tablet Take 2 tablets by mouth daily.     . Multiple Vitamin (MULTIVITAMIN WITH MINERALS) TABS tablet Take 1 tablet by mouth daily.    . nitroGLYCERIN (NITROSTAT)  0.4 MG SL tablet Place 1 tablet (0.4 mg total) under the tongue every 5 (five) minutes as needed for chest pain. 25 tablet 3   No facility-administered medications prior to visit.     PAST MEDICAL HISTORY: Past Medical History:  Diagnosis Date  . CAD (coronary artery disease)    a. 05/2015 Inflat STEMI/PCI: LM nl, LAD 50p, Om1 100 (3.0 x 16mm Promus Premier DES), OM2/3 small, RCA 40p/d, EF 45-50%.  . Diabetes mellitus without complication (HCC)   . Diastolic dysfunction    a. 11/2014 Echo: EF 60-65%, gr1 DD, mildly dil RA.  . ETOH abuse   . H/O ventricular fibrillation    a. 05/2015 in setting of inferolateral STEMI.  . Hypertensive heart disease   . Intracranial hemorrhage (HCC)    a. 11/2014 in setting of marked HTN->3.5x2.5 left frontal ICH (12-42ml by MRI); b. 11/2014 MRA no evidence of AVM or CAA; c. 11/2014 Carotid U/S: unremarkable.  . Left eye injury   . ST elevation MI (STEMI) (HCC) 06/12/15  . Stroke [facility])     PAST SURGICAL HISTORY: Past Surgical History:  Procedure Laterality Date  . CARDIAC CATHETERIZATION N/A 06/12/2015   Procedure: Left Heart Cath and Coronary Angiography;  Surgeon: Lyn Records, MD;  Location:  [facility] CV [facility];  Service: Cardiovascular;  Laterality: N/A;  . CORONARY ANGIOPLASTY      FAMILY HISTORY: Family History  Problem Relation Age of Onset  . Stroke Maternal Grandmother   . Healthy Mother   . Other Father     hx unknown    SOCIAL HISTORY: Social History   Social History  . Marital status: Married    Spouse name: N/A  . Number of children: N/A  . Years of education: N/A   Occupational History  . Not on file.   Social History Main Topics  . Smoking status: Never Smoker  . Smokeless tobacco: Never Used  . Alcohol use No     Comment: prev heavy  . Drug use: No  . Sexual activity: Not on file   Other Topics Concern  . Not on file   Social History Narrative   Lives in [place] with wife.      PHYSICAL EXAM  Vitals:    02/10/16 0956  BP: 104/70  Pulse: 71  Weight: 201 lb (91.2 kg)   Body mass index is 27.64 kg/m.  Generalized: Well developed, in no acute distress   Neurological examination  Mentation: Alert oriented to time, place, history taking. Follows all commands. Dysarthria Cranial nerve II-XII: Pupils were equal round reactive to light. Extraocular movements were full, visual field were full on confrontational test. Facial sensation and strength were normal. Uvula tongue midline. Head turning and shoulder shrug  were normal and symmetric. Motor: The motor testing reveals 5 over 5 strength of all 4 extremities. Good symmetric motor tone is noted throughout.  Sensory: Sensory testing is intact to soft touch on all 4 extremities. No evidence of extinction is noted.  Coordination: Cerebellar testing reveals good finger-nose-finger and heel-to-shin bilaterally.  Gait and station: Gait is normal. Tandem gait is unsteady. Romberg is negative. No drift is seen.  Reflexes: Deep tendon reflexes are symmetric and normal bilaterally.   DIAGNOSTIC DATA (LABS, IMAGING, TESTING) - I reviewed patient records, labs, notes, testing and imaging myself where available.  Lab Results  Component Value Date   WBC 12.6 (H) 06/14/2015   HGB 12.4 (L) 06/14/2015   HCT 37.9 (L) 06/14/2015   MCV 86.3 06/14/2015   PLT 215 06/14/2015      Component Value Date/Time   NA 137 06/22/2015 1345   K 4.4 06/22/2015 1345   CL 97 (L) 06/22/2015 1345   CO2 30 06/22/2015 1345   GLUCOSE 150 (H) 06/22/2015 1345   BUN 15 06/22/2015 1345   CREATININE 1.15 06/22/2015 1345   CALCIUM 9.2 06/22/2015 1345   PROT 6.9 09/21/2015 1241   ALBUMIN 4.1 09/21/2015 1241   AST 13 09/21/2015 1241   ALT 15 09/21/2015 1241   ALKPHOS 76 09/21/2015 1241   BILITOT 0.9 09/21/2015 1241   GFRNONAA 55 (L) 06/14/2015 1008   GFRAA >60 06/14/2015 1008   Lab Results  Component Value Date   CHOL 202 (H) 09/21/2015   HDL 38 (L) 09/21/2015   LDLCALC  144 (H) 09/21/2015   TRIG 99 09/21/2015   CHOLHDL 5.3 (H) 09/21/2015   Lab Results  Component Value Date   HGBA1C 7.5 (H) 06/12/2015   Lab Results  Component Value Date   VITAMINB12 240 12/11/2014   Lab Results  Component Value Date   TSH 1.983 12/11/2014      ASSESSMENT AND PLAN 70 y.o. year old [name]  has a past medical history of CAD (coronary artery disease); Diabetes mellitus without  complication (HCC); Diastolic dysfunction; ETOH abuse; H/O ventricular fibrillation; Hypertensive heart disease; Intracranial hemorrhage (HCC); Left eye injury; ST elevation MI (STEMI) (HCC) (06/12/15); and Stroke [facility](HCC). here with:  1. History of hemorrhagic stroke 2. Speech Difficulty   The patient will remain on aspirin and Plavix. He will maintain strict control of blood pressure with goal less than 130/90. Cholesterol LDL less than 70 and hemoglobin A1c less than 6.5%. Patient advised that if he develops any strokelike symptoms he should call 911 or go to the emergency room immediately. He should continue with exercise and monitoring his diet but in accordance with recommendations made by his cardiologist. The patient has continued to have trouble with his speech. This has been getting worse since he was discharged from rehabilitation. I will refer the patient for speech therapy. Patient advised that if his symptoms worsen or he develops any new symptoms he should let us know. He will follow-up in 6 months with Dr. Pearlean BrownieSethi.    Butch PennyMegan Cachet Mccutchen, MSN, NP-C 02/10/2016, 10:08 AM [facility] [address], [address] [place], [place] [address] [phone]

## 2016-02-11 NOTE — Progress Notes (Signed)
I agree with the above plan 

## 2016-02-22 ENCOUNTER — Ambulatory Visit: Payer: PPO | Attending: Adult Health

## 2016-02-22 DIAGNOSIS — R4701 Aphasia: Secondary | ICD-10-CM | POA: Insufficient documentation

## 2016-02-22 DIAGNOSIS — I639 Cerebral infarction, unspecified: Secondary | ICD-10-CM | POA: Diagnosis not present

## 2016-02-22 DIAGNOSIS — R482 Apraxia: Secondary | ICD-10-CM | POA: Diagnosis not present

## 2016-02-22 DIAGNOSIS — IMO0002 Reserved for concepts with insufficient information to code with codable children: Secondary | ICD-10-CM

## 2016-02-22 NOTE — Patient Instructions (Signed)
Speech Exercises  Do 2 times, 2 times a day  Call the cat "Buttercup" A calendar of [place] Four floors to cover Yellow oil ointment Fellow lovers of felines Catastrophe in [place] Plump plumbers' plums The church's chimes chimed Telling time 'til eleven Five valve levers Keep the gate closed Go see that guy Fat cows give milk Automatic DataMinnesota Golden Gophers Fat frogs flip freely TXU Corpuck Tommy into bed Get that game to Greg Thick thistles stick together Cinnamon aluminum linoleum Black bugs blood Lovely lemon linament Red leather, yellow leather  Big grocery buggy    Purple baby carriage [facility] Proper copper coffee pot Ripe purple cabbage Three free throws Owens-Illinoisim Tebow tackled  [employer] Eagles [place] Dave dipped the dessert  Duke Nav[employer] International CorporationBlue Devils Buckle that Health Netbracket    The gospel of BJ'sMark Shirts shrink, shells shouldn't PonySan Francisco 49ers Take the tackle box File the flash message Give me five  flapjacks Fundamental relatives Dye the pets purple Talking Malaw[place]urkey time after time Dark chocolate chunks Political landscape of the kingdom Actuarylectrical engineering genius We played yo-yos yesterday

## 2016-02-23 NOTE — Therapy (Signed)
[facility] [address] [place], [place], [address] Phone: [phone]   Fax:  [phone]  Speech Language Pathology Evaluation  Patient Details  Name: Richard [name] MRN: [id number] Date of Birth: [birth year] Referring Provider: Butch PennyMillikan, Megan, NP  Encounter Date: 02/22/2016      End of Session - 02/22/16 1620    Visit Number 1   Number of Visits 17   Date for SLP Re-Evaluation 05/13/16   SLP Start Time 1533   SLP Stop Time  1616   SLP Time Calculation (min) 43 min   Activity Tolerance Patient tolerated treatment well      Past Medical History:  Diagnosis Date  . CAD (coronary artery disease)    a. 05/2015 Inflat STEMI/PCI: LM nl, LAD 50p, Om1 100 (3.0 x 16mm Promus Premier DES), OM2/3 small, RCA 40p/d, EF 45-50%.  . Diabetes mellitus without complication (HCC)   . Diastolic dysfunction    a. 11/2014 Echo: EF 60-65%, gr1 DD, mildly dil RA.  . ETOH abuse   . H/O ventricular fibrillation    a. 05/2015 in setting of inferolateral STEMI.  . Hypertensive heart disease   . Intracranial hemorrhage (HCC)    a. 11/2014 in setting of marked HTN->3.5x2.5 left frontal ICH (12-5115ml by MRI); b. 11/2014 MRA no evidence of AVM or CAA; c. 11/2014 Carotid U/S: unremarkable.  . Left eye injury   . ST elevation MI (STEMI) (HCC) 06/12/15  . Stroke [facility](HCC)     Past Surgical History:  Procedure Laterality Date  . CARDIAC CATHETERIZATION N/A 06/12/2015   Procedure: Left Heart Cath and Coronary Angiography;  Surgeon: Lyn RecordsHenry W Smith, MD;  [facility];  Service: Cardiovascular;  Laterality: N/A;  . CORONARY ANGIOPLASTY      There were no vitals filed for this visit.      Subjective Assessment - 02/22/16 1543    Subjective Pt reports his speech has gotten progressively worse in last two months. "One of my biggest problems is I can't slow down."   Currently in Pain? Yes   Pain Score 2    Pain Location Hand   Pain  Orientation Right   Pain Descriptors / Indicators Aching;Numbness   Pain Onset More than a month ago   Pain Frequency Constant   Aggravating Factors  cold   Pain Relieving Factors warm water   Multiple Pain Sites Yes            SLP Evaluation [facility] - 02/22/16 1543      SLP Visit Information   SLP Received On 02/22/16   Referring Provider Butch PennyMillikan, Megan, NP   Onset Date September 2016   Medical Diagnosis CVA     General Information   HPI Premorbid lt eye injury. Pt with mild aphasia and apraxia during speech screen June 2017, after course of ST ending in early 2017. He reports today that pressure to speak quickly (internal pressure, mainly). Pt reports this has limited his verbal output because of this.      Prior Functional Status   Cognitive/Linguistic Baseline Within functional limits     Cognition   Overall Cognitive Status Within Functional Limits for tasks assessed     Auditory Comprehension   Overall Auditory Comprehension Appears within functional limits for tasks assessed     Verbal Expression   Overall Verbal Expression Impaired   Level of Generative/Spontaneous Verbalization Conversation   Interfering Components --  time since onset   Effective Techniques --  slowed  rate   Other Verbal Expression Comments In reading, pt made semantic errors such as "many"/various, "within"/beyond, "of"/from, "through"/from, and "against"/by.  When pt slowed his rate, errors diminished considerablly.     Oral Motor/Sensory Function   Overall Oral Motor/Sensory Function Other (comment)  not formally assessed due to time constraints   Overall Oral Motor/Sensory Function Some misarticulations of silibants /s/ and /z/ present in conversational speech.     Motor Speech   Overall Motor Speech Impaired   Articulation Impaired   Level of Impairment Word   Intelligibility Intelligibility reduced   Word 75-100% accurate  90% success, worse with >/= 4 syllable words   Phrase  75-100% accurate   Sentence 75-100% accurate   Conversation 75-100% accurate   Motor Planning Impaired   Level of Impairment Word   Motor Speech Errors Groping for words;Inconsistent  pt not always aware of errors   Interfering Components --  time since onset   Effective Techniques Slow rate;Pause  "chunking"                         SLP Education - 02/22/16 1733    Education provided Yes   Education Details compensations for apraxia/aphasia - slowed rate   Person(s) Educated Patient   Methods Explanation;Demonstration;Verbal cues   Comprehension Verbalized understanding;Returned demonstration;Verbal cues required;Need further instruction          SLP Short Term Goals - 02/23/16 1344      SLP SHORT TERM GOAL #1   Title pt will produce sentence responses 90% success using compensations over three sessions   Time 4   Period Weeks   Status New     SLP SHORT TERM GOAL #2   Title pt will engage in 5 minutes simple conversation WFL with modified independence (compensations) over three sessions   Time 4   Period Weeks   Status New     SLP SHORT TERM GOAL #3   Title pt will provide 1-2 sentence responses to SLP verbal stimuli with 90% success using compensations   Time 4   Period Weeks   Status New          SLP Long Term Goals - 02/23/16 1348      SLP LONG TERM GOAL #1   Title pt will participate functionally in 10 minutes min-mod complex conversation using compensations during 3 sessions   Time 8   Period Weeks   Status New     SLP LONG TERM GOAL #2   Title pt will engage in 8 minutes mod complex conversation with occasional min A for compensations   Time 8   Period Weeks   Status New          Plan - 02/22/16 1621    Clinical Impression Statement Pt presents with persistent expressive aphasia and verbal apraxia, worsened by fatigue and highly emotional situations including internal pressure to speak quickly. Pt also complains of reduced  reading comprehension when reading aloud, however SLP believes this is due to pt focus intently on reading aloud and not so much on content. Pt may require formal assessment with reading comprehension in the future. Skilled ST would be beneficial to re-habitualize compensation strategies for expressive aphasia and verbal apraxia. Pt reports decr in QOL. "This gets me depressed," he stated as he conversed with SLP re: his latest difficulties with verbal expression.   Speech Therapy Frequency 2x / week   Duration --  8 weeks (possibly 6?)  Treatment/Interventions Language facilitation;Compensatory techniques;Internal/external aids;SLP instruction and feedback;Multimodal communcation approach;Functional tasks;Patient/family education   Potential to Achieve Goals Good   Potential Considerations Severity of impairments;Other (comment)  anxiety/internal distraction & pressure to speak more quickly   Consulted and Agree with Plan of Care Patient      Patient will benefit from skilled therapeutic intervention in order to improve the following deficits and impairments:   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 [facility]) - Plan: SLP plan of care cert/re-cert  Verbal apraxia - Plan: SLP plan of care cert/re-cert      G-Codes - 03-02-2016 1740    Functional Assessment Tool Used NOMS- 5 (25-30% impaired)   Functional Limitations Spoken language expressive   Spoken Language Expression Current Status [id number]) At least 20 percent but less than 40 percent impaired, limited or restricted   Spoken Language Expression Goal Status (U0454) At least 1 percent but less than 20 percent impaired, limited or restricted      Problem List Patient Active Problem List   Diagnosis Date Noted  . CAD (coronary artery disease) 06/22/2015  . Hypertensive heart disease 06/14/2015  . Hyperlipidemia 06/14/2015  . ST elevation (STEMI) myocardial infarction involving left circumflex coronary artery (HCC) 06/12/2015    Class: Acute  . Acute MI,  inferolateral wall, initial episode of care (HCC) 06/12/2015  . Acute ST elevation myocardial infarction (STEMI) (HCC) 06/12/2015  . Aphasia complicating stroke (HCC) 02/09/2015  . 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 Type 2 diabetes mellitus with peripheral neuropathy (HCC)   . Essential hypertension   . Alcohol withdrawal (HCC)   . Cerebral hemorrhage (HCC)   . Endotracheally intubated   . ICH (intracerebral hemorrhage) (HCC) 12/10/2014    [facility] ,MS, CCC-SLP  02/23/2016, 1:54 PM  [facility] [address] [place], [place], [address] Phone: [phone]   Fax:  [phone]  Name: Richard [name] MRN: [id number] Date of Birth: [birth year]

## 2016-02-25 ENCOUNTER — Ambulatory Visit: Payer: PPO | Admitting: *Deleted

## 2016-02-25 DIAGNOSIS — I639 Cerebral infarction, unspecified: Secondary | ICD-10-CM | POA: Diagnosis not present

## 2016-02-25 DIAGNOSIS — IMO0002 Reserved for concepts with insufficient information to code with codable children: Secondary | ICD-10-CM

## 2016-02-25 DIAGNOSIS — R482 Apraxia: Secondary | ICD-10-CM

## 2016-02-25 NOTE — Therapy (Signed)
[facility] [address] [place], [place], [address] Phone: [phone]   Fax:  [phone]  Speech Language Pathology Treatment  Patient Details  Name: Richard JakschLeon Credeur MRN: [id number] Date of Birth: Mar 29, 1945 Referring Provider: Butch PennyMillikan, Megan, NP  Encounter Date: 02/25/2016      End of Session - 02/25/16 1639    Visit Number 2   Number of Visits 17   Date for SLP Re-Evaluation 05/13/16   SLP Start Time 1450   SLP Stop Time  1535   SLP Time Calculation (min) 45 min   Activity Tolerance Patient tolerated treatment well      Past Medical History:  Diagnosis Date  . CAD (coronary artery disease)    a. 05/2015 Inflat STEMI/PCI: LM nl, LAD 50p, Om1 100 (3.0 x 16mm Promus Premier DES), OM2/3 small, RCA 40p/d, EF 45-50%.  . Diabetes mellitus without complication (HCC)   . Diastolic dysfunction    a. 11/2014 Echo: EF 60-65%, gr1 DD, mildly dil RA.  . ETOH abuse   . H/O ventricular fibrillation    a. 05/2015 in setting of inferolateral STEMI.  . Hypertensive heart disease   . Intracranial hemorrhage (HCC)    a. 11/2014 in setting of marked HTN->3.5x2.5 left frontal ICH (12-6315ml by MRI); b. 11/2014 MRA no evidence of AVM or CAA; c. 11/2014 Carotid U/S: unremarkable.  . Left eye injury   . ST elevation MI (STEMI) (HCC) 06/12/15  . Stroke [facility](HCC)     Past Surgical History:  Procedure Laterality Date  . CARDIAC CATHETERIZATION N/A 06/12/2015   Procedure: Left Heart Cath and Coronary Angiography;  Surgeon: Lyn RecordsHenry W Smith, MD;  [facility];  Service: Cardiovascular;  Laterality: N/A;  . CORONARY ANGIOPLASTY      There were no vitals filed for this visit.      Subjective Assessment - 02/25/16 1632    Subjective "My speech is worse than it was in June"               ADULT SLP TREATMENT - 02/25/16 0001      General Information   Behavior/Cognition Alert;Cooperative;Pleasant mood     Treatment  Provided   Treatment provided Cognitive-Linquistic     Cognitive-Linquistic Treatment   Treatment focused on Aphasia;Apraxia;Patient/family/caregiver education   Skilled Treatment Pt seen for skilled ST session focusing on communication goals including conversation level interaction, assessment of reading comprehension, and strategies for verbal apraxia. Pt reports difficulty "getting sentences out so they're understood". Pt was able to repeat phrases with 80% accuracy, requiring verbal cues to decrease rate for successful management of apraxic errors. He was given a list of mutisyllabic words to repeat several times, and then verbalize a sentence using the word appropriately. This activity is designed to focus on both word retrieval and verbal apraxia drills. Reading comprehension was addressed this session as well. Pt was able to complete functional reading tasks (weather report, medication label) without difficulty. Paragraph length material was more challenging for pt. He was given homework to read paragraphs and answer questions.      Assessment / Recommendations / Plan   Plan Continue with current plan of care     Progression Toward Goals   Progression toward goals Progressing toward goals          SLP Education - 02/25/16 1638    Education provided Yes   Education Details reading comprehension activity, apraxia drills and tasks to address word finding   Person(s) Educated Patient  Methods Explanation;Demonstration;Handout   Comprehension Verbalized understanding;Returned demonstration          SLP Short Term Goals - 02/25/16 1642      SLP SHORT TERM GOAL #1   Title pt will produce sentence responses 90% success using compensations over three sessions   Time 4   Period Weeks   Status On-going     SLP SHORT TERM GOAL #2   Title pt will engage in 5 minutes simple conversation WFL with modified independence (compensations) over three sessions   Time 4   Period Weeks    Status On-going     SLP SHORT TERM GOAL #3   Title pt will provide 1-2 sentence responses to SLP verbal stimuli with 90% success using compensations   Time 4   Period Weeks   Status On-going          SLP Long Term Goals - 02/25/16 1642      SLP LONG TERM GOAL #1   Title pt will participate functionally in 10 minutes min-mod complex conversation using compensations during 3 sessions   Time 8   Period Weeks   Status On-going     SLP LONG TERM GOAL #2   Title pt will engage in 8 minutes mod complex conversation with occasional min A for compensations   Time 8   Period Weeks   Status On-going          Plan - 02/25/16 1639    Clinical Impression Statement Pt returns for therapy after a break since June, due to worsening difficulty with effective communication. Pt is highly motivated to improve, and is open to suggestions and home activities to carryover strategies discussed in therapy. Continued skilled ST is recommended to improve pt communicative effectiveness, his reading comprehension (pt reports being an avid reader prior to his CVA), and quality of life.    Speech Therapy Frequency 2x / week   Duration --  6-8 weeks   Treatment/Interventions Language facilitation;Compensatory techniques;Internal/external aids;SLP instruction and feedback;Multimodal communcation approach;Functional tasks;Patient/family education   Potential to Achieve Goals Good   Potential Considerations Severity of impairments   Consulted and Agree with Plan of Care Patient      Patient will benefit from skilled therapeutic intervention in order to improve the following deficits and impairments:   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 [facility](HCC)  Verbal apraxia    Problem List Patient Active Problem List   Diagnosis Date Noted  . CAD (coronary artery disease) 06/22/2015  . Hypertensive heart disease 06/14/2015  . Hyperlipidemia 06/14/2015  . ST elevation (STEMI) myocardial infarction involving left circumflex  coronary artery (HCC) 06/12/2015    Class: Acute  . Acute MI, inferolateral wall, initial episode of care (HCC) 06/12/2015  . Acute ST elevation myocardial infarction (STEMI) (HCC) 06/12/2015  . Aphasia complicating stroke (HCC) 02/09/2015  . 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 Type 2 diabetes mellitus with peripheral neuropathy (HCC)   . Essential hypertension   . Alcohol withdrawal (HCC)   . Cerebral hemorrhage (HCC)   . Endotracheally intubated   . ICH (intracerebral hemorrhage) (HCC) 12/10/2014   Celia B. Des MoinesBueche, MSP, CCC-SLP  Leigh AuroraBueche, Celia Brown 02/25/2016, 4:43 PM  [facility] [address] [place], [place], [address] Phone: [phone]   Fax:  [phone]   Name: Richard JakschLeon Schmid MRN: [id number] Date of Birth: May 16, 1945

## 2016-03-01 ENCOUNTER — Ambulatory Visit: Payer: PPO | Attending: Adult Health | Admitting: *Deleted

## 2016-03-01 DIAGNOSIS — R4701 Aphasia: Secondary | ICD-10-CM | POA: Diagnosis not present

## 2016-03-01 DIAGNOSIS — I639 Cerebral infarction, unspecified: Secondary | ICD-10-CM | POA: Insufficient documentation

## 2016-03-01 DIAGNOSIS — R482 Apraxia: Secondary | ICD-10-CM

## 2016-03-01 DIAGNOSIS — IMO0002 Reserved for concepts with insufficient information to code with codable children: Secondary | ICD-10-CM

## 2016-03-01 NOTE — Therapy (Signed)
[facility] [address] [place], [place], [address] Phone: [phone]   Fax:  [phone]  Speech Language Pathology Treatment  Patient Details  Name: Richard JakschLeon Marsan MRN: [id number] Date of Birth: 09/24/1945 Referring Provider: Butch PennyMillikan, Megan, NP  Encounter Date: 03/01/2016      End of Session - 03/01/16 1619    Visit Number 3   Number of Visits 17   Date for SLP Re-Evaluation 05/13/16   SLP Start Time 1445   SLP Stop Time  1535   SLP Time Calculation (min) 50 min   Activity Tolerance Patient tolerated treatment well      Past Medical History:  Diagnosis Date  . CAD (coronary artery disease)    a. 05/2015 Inflat STEMI/PCI: LM nl, LAD 50p, Om1 100 (3.0 x 16mm Promus Premier DES), OM2/3 small, RCA 40p/d, EF 45-50%.  . Diabetes mellitus without complication (HCC)   . Diastolic dysfunction    a. 11/2014 Echo: EF 60-65%, gr1 DD, mildly dil RA.  . ETOH abuse   . H/O ventricular fibrillation    a. 05/2015 in setting of inferolateral STEMI.  . Hypertensive heart disease   . Intracranial hemorrhage (HCC)    a. 11/2014 in setting of marked HTN->3.5x2.5 left frontal ICH (12-1215ml by MRI); b. 11/2014 MRA no evidence of AVM or CAA; c. 11/2014 Carotid U/S: unremarkable.  . Left eye injury   . ST elevation MI (STEMI) (HCC) 06/12/15  . Stroke [facility](HCC)     Past Surgical History:  Procedure Laterality Date  . CARDIAC CATHETERIZATION N/A 06/12/2015   Procedure: Left Heart Cath and Coronary Angiography;  Surgeon: Lyn RecordsHenry W Smith, MD;  [facility];  Service: Cardiovascular;  Laterality: N/A;  . CORONARY ANGIOPLASTY      There were no vitals filed for this visit.      Subjective Assessment - 03/01/16 1610    Subjective Reading comprehension is just not what is important to me right now   Currently in Pain? No/denies               ADULT SLP TREATMENT - 03/01/16 0001      General Information   Behavior/Cognition Alert;Cooperative;Pleasant mood     Treatment Provided   Treatment provided Cognitive-Linquistic     Cognitive-Linquistic Treatment   Treatment focused on Aphasia;Apraxia;Patient/family/caregiver education   Skilled Treatment Pt seen for skilled ST session focusing on expressive language goals. Pt reported that he would prefer not working on reading comprehension at this time, as his verbal expression and apraxia are "more important" to work on right now. Pt was able to repeat multi-syllabic words 3x with 72% accuracy, and create a sentence using that word appropriately with 70% accuracy. Pt able to define words without naming with 75% success. Decreased rate noticably increases communicative success. Pt feels like his increased rate is to "get it out before I trip over it". Pt was encouraged to think about his training in drama/public speaking to remind himself of the strategies he learned to pace himself and articulate effectively. Pt was encouraged to continue working through [employer]-syllabic words, repeating them (for apraxia drill), and creating sentences and/or describing without naming for verbal expression/word retrieval.      Assessment / Recommendations / Plan   Plan Continue with current plan of care     Progression Toward Goals   Progression toward goals Progressing toward goals          SLP Education - 03/01/16 1618  Education provided Yes   Education Details reviewing word list for apraxia, sentence formulation and describing for word retrieval. Think of strategies taught for public speaking/drama.   Person(s) Educated Patient   Methods Demonstration;Explanation;Handout   Comprehension Verbalized understanding;Returned demonstration          SLP Short Term Goals - 03/01/16 1622      SLP SHORT TERM GOAL #1   Title pt will produce sentence responses 90% success using compensations over three sessions   Time 3   Period Weeks   Status On-going      SLP SHORT TERM GOAL #2   Title pt will engage in 5 minutes simple conversation WFL with modified independence (compensations) over three sessions   Time 3   Period Weeks   Status On-going     SLP SHORT TERM GOAL #3   Title pt will provide 1-2 sentence responses to SLP verbal stimuli with 90% success using compensations   Time 3   Period Weeks   Status On-going          SLP Long Term Goals - 03/01/16 1622      SLP LONG TERM GOAL #1   Title pt will participate functionally in 10 minutes min-mod complex conversation using compensations during 3 sessions   Time 7   Period Weeks   Status On-going     SLP LONG TERM GOAL #2   Title pt will engage in 8 minutes mod complex conversation with occasional min A for compensations   Time 7   Period Weeks   Status On-going          Plan - 03/01/16 1619    Clinical Impression Statement Pt is highly motivated to improve his verbal expressive fluency and functional word finding. He verbalizes strategies to think through what he wants to say before he says it, and recalls instruction from drama/public speaking classes on rate and articulation. Continued ST intervention is recommended to improve effective communication and pt quality of life.   Speech Therapy Frequency 2x / week   Duration --  6-8 weeks   Treatment/Interventions Language facilitation;Compensatory techniques;Internal/external aids;SLP instruction and feedback;Multimodal communcation approach;Functional tasks;Patient/family education   Potential to Achieve Goals Good   Potential Considerations Severity of impairments   SLP Home Exercise Plan word list for apraxia, word finding activities, identification of strategies to manage speech rate and articulatory precision.   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 CAD (coronary artery disease) 06/22/2015  . Hypertensive heart disease 06/14/2015  . Hyperlipidemia 06/14/2015  . ST elevation (STEMI) myocardial infarction involving left circumflex coronary artery (HCC) 06/12/2015    Class: Acute  . Acute MI, inferolateral wall, initial episode of care (HCC) 06/12/2015  . Acute ST elevation myocardial infarction (STEMI) (HCC) 06/12/2015  . Aphasia complicating stroke (HCC) 02/09/2015  . 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 Type 2 diabetes mellitus with peripheral neuropathy (HCC)   . Essential hypertension   . Alcohol withdrawal (HCC)   . Cerebral hemorrhage (HCC)   . Endotracheally intubated   . ICH (intracerebral hemorrhage) (HCC) 12/10/2014   Celia B. MaywoodBueche, MSP, CCC-SLP  Leigh AuroraBueche, Celia Brown 03/01/2016, 4:23 PM  [facility] [address] [place], [place], [address] Phone: [phone]   Fax:  [phone]   Name: Kyrese Gartman MRN: [id number] Date of Birth: [birth year]

## 2016-03-03 ENCOUNTER — Ambulatory Visit: Payer: PPO | Admitting: *Deleted

## 2016-03-03 DIAGNOSIS — R482 Apraxia: Secondary | ICD-10-CM

## 2016-03-03 DIAGNOSIS — IMO0002 Reserved for concepts with insufficient information to code with codable children: Secondary | ICD-10-CM

## 2016-03-03 DIAGNOSIS — I639 Cerebral infarction, unspecified: Secondary | ICD-10-CM | POA: Diagnosis not present

## 2016-03-03 NOTE — Therapy (Signed)
[facility] [address] [place], [place], [address] Phone: [phone]   Fax:  [phone]  Speech Language Pathology Treatment  Patient Details  Name: Richard JakschLeon Noh MRN: [id number] Date of Birth: 05/28/1945 Referring Provider: Butch PennyMillikan, Megan, NP  Encounter Date: 03/03/2016      End of Session - 03/03/16 1255    Visit Number 4   Number of Visits 17   Date for SLP Re-Evaluation 05/13/16   SLP Start Time 1100   SLP Stop Time  1145   SLP Time Calculation (min) 45 min   Activity Tolerance Patient tolerated treatment well      Past Medical History:  Diagnosis Date  . CAD (coronary artery disease)    a. 05/2015 Inflat STEMI/PCI: LM nl, LAD 50p, Om1 100 (3.0 x 16mm Promus Premier DES), OM2/3 small, RCA 40p/d, EF 45-50%.  . Diabetes mellitus without complication (HCC)   . Diastolic dysfunction    a. 11/2014 Echo: EF 60-65%, gr1 DD, mildly dil RA.  . ETOH abuse   . H/O ventricular fibrillation    a. 05/2015 in setting of inferolateral STEMI.  . Hypertensive heart disease   . Intracranial hemorrhage (HCC)    a. 11/2014 in setting of marked HTN->3.5x2.5 left frontal ICH (12-5415ml by MRI); b. 11/2014 MRA no evidence of AVM or CAA; c. 11/2014 Carotid U/S: unremarkable.  . Left eye injury   . ST elevation MI (STEMI) (HCC) 06/12/15  . Stroke [facility](HCC)     Past Surgical History:  Procedure Laterality Date  . CARDIAC CATHETERIZATION N/A 06/12/2015   Procedure: Left Heart Cath and Coronary Angiography;  Surgeon: Lyn RecordsHenry W Smith, MD;  [facility];  Service: Cardiovascular;  Laterality: N/A;  . CORONARY ANGIOPLASTY      There were no vitals filed for this visit.      Subjective Assessment - 03/03/16 1249    Subjective I feel like I run out of breath when I'm talking   Currently in Pain? No/denies               ADULT SLP TREATMENT - 03/03/16 0001      General Information   Behavior/Cognition  Alert;Cooperative;Pleasant mood     Treatment Provided   Treatment provided Cognitive-Linquistic     Pain Assessment   Pain Assessment No/denies pain     Cognitive-Linquistic Treatment   Treatment focused on Aphasia;Apraxia;Patient/family/caregiver education   Skilled Treatment Pt seen for skilled ST targeting speech and language goals. Pt verbalizes recall of training for drama and public speaking - need to slow down and pause, to emphasize syllables and project, even at a whisper. Pt verbalized concern that he gets short of breath when trying to talk. The majority of this session was focused on education regarding mindfulness of breathing and deep/diaphragmatic breathing exercises. Pt was given written information about these strategies, and encouraged to practice these exercises at least once a day.     Assessment / Recommendations / Plan   Plan Continue with current plan of care     Progression Toward Goals   Progression toward goals Progressing toward goals          SLP Education - 03/03/16 1254    Education provided Yes   Education Details handouts on increasing mindfulness of breathing and diaphragmatic breathing exercises.   Person(s) Educated Patient   Methods Explanation;Demonstration;Handout   Comprehension Verbalized understanding;Returned demonstration          SLP Short Term Goals - 03/03/16  1259      SLP SHORT TERM GOAL #1   Title pt will produce sentence responses 90% success using compensations over three sessions   Time 3   Period Weeks   Status On-going     SLP SHORT TERM GOAL #2   Title pt will engage in 5 minutes simple conversation WFL with modified independence (compensations) over three sessions   Time 3   Period Weeks   Status On-going     SLP SHORT TERM GOAL #3   Title pt will provide 1-2 sentence responses to SLP verbal stimuli with 90% success using compensations   Time 3   Period Weeks   Status On-going          SLP Long Term Goals  - 03/03/16 1259      SLP LONG TERM GOAL #1   Title pt will participate functionally in 10 minutes min-mod complex conversation using compensations during 3 sessions   Time 7   Period Weeks   Status On-going     SLP LONG TERM GOAL #2   Title pt will engage in 8 minutes mod complex conversation with occasional min A for compensations   Time 7   Period Weeks   Status On-going          Plan - 03/03/16 1256    Clinical Impression Statement Pt continues motivated in therapy, and reports follow through of strategies discussed in therapy. Pt still requires cues to decrease rate and overarticulate, and has identified breathing as an area of concern. Continued ST intervention is recommended to improve effective communication and pt quality of life.   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 CAD (coronary artery disease) 06/22/2015  . Hypertensive heart disease 06/14/2015  . Hyperlipidemia 06/14/2015  . ST elevation (STEMI) myocardial infarction involving left circumflex coronary artery (HCC) 06/12/2015    Class: Acute  . Acute MI, inferolateral wall, initial episode of care (HCC) 06/12/2015  . Acute ST elevation myocardial infarction (STEMI) (HCC) 06/12/2015  . Aphasia complicating stroke (HCC) 02/09/2015  . Alterations of sensations  following CVA (cerebrovascular accident) 12/23/2014  . 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 Type 2 diabetes mellitus with peripheral neuropathy (HCC)   . Essential hypertension   . Alcohol withdrawal (HCC)   . Cerebral hemorrhage (HCC)   . Endotracheally intubated   . ICH (intracerebral hemorrhage) (HCC) 12/10/2014   Claus Silvestro B. McDermottBueche, MSP, CCC-SLP  Leigh AuroraBueche, Orma Cheetham Brown 03/03/2016, 1:00 PM  [facility] [address] [place], [place], [address] Phone: [phone]   Fax:  [phone]   Name: Richard JakschLeon Coye MRN: [id number] Date of Birth: 03-Mar-1946

## 2016-03-08 ENCOUNTER — Ambulatory Visit: Payer: PPO | Admitting: *Deleted

## 2016-03-08 DIAGNOSIS — I639 Cerebral infarction, unspecified: Secondary | ICD-10-CM | POA: Diagnosis not present

## 2016-03-08 DIAGNOSIS — R482 Apraxia: Secondary | ICD-10-CM

## 2016-03-08 DIAGNOSIS — IMO0002 Reserved for concepts with insufficient information to code with codable children: Secondary | ICD-10-CM

## 2016-03-08 NOTE — Therapy (Signed)
[facility] [address] [place], [place], [address] Phone: [phone]   Fax:  [phone]  Speech Language Pathology Treatment  Patient Details  Name: Richard [name] MRN: [id number] Date of Birth: 1945-07-17 Referring Provider: Ward Givens, NP  Encounter Date: 03/08/2016      End of Session - 03/08/16 1401    Visit Number 5   Number of Visits 17   Date for SLP Re-Evaluation 05/13/16   SLP Start Time 1310   SLP Stop Time  0932   SLP Time Calculation (min) 45 min   Activity Tolerance Patient tolerated treatment well      Past Medical History:  Diagnosis Date  . CAD (coronary artery disease)    a. 05/2015 Inflat STEMI/PCI: LM nl, LAD 50p, Om1 100 (3.0 x 59m Promus Premier DES), OM2/3 small, RCA 40p/d, EF 45-50%.  . Diabetes mellitus without complication ([place]   . Diastolic dysfunction    a. 11/2014 Echo: EF 60-65%, gr1 DD, mildly dil RA.  . ETOH abuse   . H/O ventricular fibrillation    a. 05/2015 in setting of inferolateral STEMI.  . Hypertensive heart disease   . [place]acranial hemorrhage ([place]    a. 11/2014 in setting of marked HTN->3.5x2.5 left frontal ICH (12-198mby MRI); b. 11/2014 MRA no evidence of AVM or CAA; c. 11/2014 Carotid U/S: unremarkable.  . Left eye injury   . ST elevation [place] ([place]I) ([place]  . [facility]    Past Surgical History:  Procedure Laterality Date  . CARDIAC CATHETERIZATION N/A 06/12/2015   Procedure: Left Heart Cath and Coronary Angiography;  Surgeon: HeBelva CromeMD;  [place] [facility];  Service: Cardiovascular;  Laterality: N/A;  . CORONARY ANGIOPLASTY      There were no vitals filed for this visit.      Subjective Assessment - 03/08/16 1354    Subjective "I'm doing well - trying to slow down"   Currently in Pain? No/denies               ADULT SLP TREATMENT - 03/08/16 0001      General Information   Behavior/Cognition  Alert;Cooperative;Pleasant mood     Treatment Provided   Treatment provided Cognitive-Linquistic     Pain Assessment   Pain Assessment No/denies pain     Cognitive-Linquistic Treatment   Treatment focused on Aphasia;Apraxia;Patient/family/caregiver education   Skilled Treatment Pt seen for skilled ST session targeting communication goals. Pt observed to be utilizing strategy of pausing and slowing rate with increased independence in quiet environment. Independent self-correction of dysfluencies were noted 8x in 10 minute conversation. Pt reports he tends to "shut down" communicatively when in a distracting environment, and is increasingly minimizing distractions when possible (turning off TV, limiting number of people he is speaking with at one time). SLP and pt reviewed short and long term goals, and identified updated goals to more fully meet pt functional goal of communicative effectiveness. Goals updated, pt in agreement.      Assessment / Recommendations / Plan   Plan Goals updated     Progression Toward Goals   Progression toward goals Goals met and updated          SLP Education - 03/08/16 1400    Education provided Yes   Education Details identifying, then increasing distractions gradually   Person(s) Educated Patient   Methods Explanation;Demonstration   Comprehension Verbalized understanding;Returned demonstration          SLP Short  Term Goals - 03/08/16 1448      SLP SHORT TERM GOAL #1   Title pt will produce sentence responses 90% success using compensations over three sessions   Status Achieved     SLP SHORT TERM GOAL #2   Title pt will engage in 5 minutes simple conversation WFL with modified independence (compensations) over three sessions   Status Achieved     SLP SHORT TERM GOAL #3   Title pt will provide 1-2 sentence responses to SLP verbal stimuli with 90% success using compensations   Status Achieved     SLP SHORT TERM GOAL #4   Title Pt will  produce longer responses (7-10 words) in therapy with min cues, and verbalize use of longer responses in social situations.   Baseline Pt verbalizes tendency to limit responses (yes, no, 2-3 words) and shut down in social situations.   Time 6   Period Weeks   Status New     SLP SHORT TERM GOAL #5   Title Pt will engage in 15 minute conversation in distracting environment using strategies independently, with self-correction needed <10x   Baseline 8 self corrections noted in 10 minute conversation in quiet 1:1 environment   Time 6   Period Weeks   Status New          SLP Long Term Goals - 03/08/16 1452      SLP LONG TERM GOAL #1   Title pt will participate functionally in 10 minutes min-mod complex conversation using compensations during 3 sessions   Status Achieved     SLP LONG TERM GOAL #2   Title pt will engage in 8 minutes mod complex conversation with occasional min A for compensations   Status Achieved     SLP LONG TERM GOAL #3   Title Pt will verbalize satisfaction with communicative effectiveness with family and friends in a variety of social situations.   Baseline Pt limits interaction outside of home with wife and therapy with SLP.   Time 6   Period Weeks   Status New          Plan - 03/08/16 1447    Clinical Impression Statement Pt pleased with progress thus far. No report of shortness of breath today. Pt noted to used strategies of decreased rate and overarticulation more independently. Concern today was regarding ease of distraction when communicating   Speech Therapy Frequency 2x / week      Patient will benefit from skilled therapeutic intervention in order to improve the following deficits and impairments:   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 [facility])  Verbal apraxia    Problem List Patient Active Problem List   Diagnosis Date Noted  . CAD (coronary artery disease) 06/22/2015  . Hypertensive heart disease 06/14/2015  . Hyperlipidemia 06/14/2015  . ST elevation  (STEMI) myocardial infarction involving left circumflex coronary artery (HCC) 06/12/2015    Class: Acute  . Acute MI, inferolateral wall, initial episode of care (HCC) 06/12/2015  . Acute ST elevation myocardial infarction (STEMI) (HCC) 06/12/2015  . Aphasia complicating stroke (HCC) 02/09/2015  . 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 Type 2 diabetes mellitus with peripheral neuropathy (HCC)   . Essential hypertension   . Alcohol withdrawal (HCC)   . Cerebral hemorrhage (HCC)   . Endotracheally intubated   . ICH (intracerebral hemorrhage) (HCC) 12/10/2014   Odester Nilson B. [place], MSP, CCC-SLP  Leigh Aurora 03/08/2016, 2:54 PM  [facility] [address] [place], [place], [address] Phone: [phone]   Fax:  [phone]   Name: Richard [name] MRN: [id number] Date of Birth: November 11, 1945

## 2016-03-09 ENCOUNTER — Ambulatory Visit: Payer: PPO

## 2016-03-10 ENCOUNTER — Ambulatory Visit: Payer: PPO | Admitting: *Deleted

## 2016-03-10 DIAGNOSIS — IMO0002 Reserved for concepts with insufficient information to code with codable children: Secondary | ICD-10-CM

## 2016-03-10 DIAGNOSIS — R482 Apraxia: Secondary | ICD-10-CM

## 2016-03-10 DIAGNOSIS — I639 Cerebral infarction, unspecified: Secondary | ICD-10-CM | POA: Diagnosis not present

## 2016-03-10 NOTE — Therapy (Signed)
[facility] [address] [place], [place], [address] Phone: [phone]   Fax:  [phone]  Speech Language Pathology Treatment  Patient Details  Name: Richard [name] MRN: [id number] Date of Birth: 01-20-1946 Referring Provider: Butch PennyMillikan, Megan, NP  Encounter Date: 03/10/2016      End of Session - 03/10/16 1454    Visit Number 6   Number of Visits 17   Date for SLP Re-Evaluation 05/13/16   SLP Start Time 1400   SLP Stop Time  1445   SLP Time Calculation (min) 45 min   Activity Tolerance Patient tolerated treatment well      Past Medical History:  Diagnosis Date  . CAD (coronary artery disease)    a. 05/2015 Inflat STEMI/PCI: LM nl, LAD 50p, Om1 100 (3.0 x 16mm Promus Premier DES), OM2/3 small, RCA 40p/d, EF 45-50%.  . Diabetes mellitus without complication (HCC)   . Diastolic dysfunction    a. 11/2014 Echo: EF 60-65%, gr1 DD, mildly dil RA.  . ETOH abuse   . H/O ventricular fibrillation    a. 05/2015 in setting of inferolateral STEMI.  . Hypertensive heart disease   . Intracranial hemorrhage (HCC)    a. 11/2014 in setting of marked HTN->3.5x2.5 left frontal ICH (12-3815ml by MRI); b. 11/2014 MRA no evidence of AVM or CAA; c. 11/2014 Carotid U/S: unremarkable.  . Left eye injury   . ST elevation MI (STEMI) (HCC) 06/12/15  . Stroke [facility](HCC)     Past Surgical History:  Procedure Laterality Date  . CARDIAC CATHETERIZATION N/A 06/12/2015   Procedure: Left Heart Cath and Coronary Angiography;  Surgeon: Lyn RecordsHenry W Smith, MD;  [facility];  Service: Cardiovascular;  Laterality: N/A;  . CORONARY ANGIOPLASTY      There were no vitals filed for this visit.      Subjective Assessment - 03/10/16 1447    Subjective " a lot of this is self-directed"   Currently in Pain? No/denies               ADULT SLP TREATMENT - 03/10/16 0001      General Information   Behavior/Cognition Alert;Cooperative;Pleasant  mood     Treatment Provided   Treatment provided Cognitive-Linguistic     Pain Assessment   Pain Assessment No/denies pain     Cognitive-Linquistic Treatment   Treatment focused on Aphasia;Apraxia   Skilled Treatment Pt seen for skilled ST session focusing on communication goals. Session started in the OT/PT gym, which was very busy with both auditory and visual distractions. Pt used strategies of decreased rate (and lowered volume) and communicated effectively recall of newpaper article he read at home while watching TV news. Pt indicated later that he "struggled with the noise" in the gym. Pt required repetition x3 during 25 minute conversation in the gym, due to decreased intelligibility or low volume.  Once back in ST office/quiet environment, pt speech rate increased and pt appeared much more comfortable. Pt's family is noticing improvement in his communication.      Assessment / Recommendations / Plan   Plan Continue with current plan of care     Progression Toward Goals   Progression toward goals Progressing toward goals          SLP Education - 03/10/16 1454    Education provided Yes   Education Details continue use of strategies at home and when communicating with family/friends   Person(s) Educated Patient   Methods Explanation;Demonstration   Comprehension Verbalized  understanding;Returned demonstration          SLP Short Term Goals - 03/10/16 1458      SLP SHORT TERM GOAL #4   Title Pt will produce longer responses (7-10 words) in therapy with min cues, and verbalize use of longer responses in social situations.   Time 6   Period Weeks   Status On-going     SLP SHORT TERM GOAL #5   Title Pt will engage in 15 minute conversation in distracting environment using strategies independently, with self-correction needed <10x   Time 6   Period Weeks   Status On-going          SLP Long Term Goals - 03/10/16 1458      SLP LONG TERM GOAL #3   Title Pt will  verbalize satisfaction with communicative effectiveness with family and friends in a variety of social situations.   Time 6   Period Weeks   Status On-going          Plan - 03/10/16 1455    Clinical Impression Statement Pt continues to report satisfaction with progress, and indicated his family members have noticed improvement in his communicative effectiveness. Pt actively using strategies at home that are discussed in therapy, and was able to engage in 25 minute conversation in the (busy) PT/OT gym today. Continued ST intervention is recommended to further communicative effectiveness and pt quality of life.   Speech Therapy Frequency 2x / week   Duration --  6-8 weeks   Treatment/Interventions Language facilitation;Compensatory techniques;Internal/external aids;SLP instruction and feedback;Multimodal communcation approach;Functional tasks;Patient/family education   Potential to Achieve Goals Good   Potential Considerations Cooperation/participation level;Family/community support;Ability to learn/carryover information;Previous level of function   SLP Home Exercise Plan incorporating distractions, continuing independent use of strategies.   Consulted and Agree with Plan of Care Patient      Patient will benefit from skilled therapeutic intervention in order to improve the following deficits and impairments:   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 [facility](HCC)  Verbal apraxia    Problem List Patient Active Problem List   Diagnosis Date Noted  . CAD (coronary artery disease) 06/22/2015  . Hypertensive heart disease 06/14/2015  . Hyperlipidemia 06/14/2015  . ST elevation (STEMI) myocardial infarction involving left circumflex coronary artery (HCC) 06/12/2015    Class: Acute  . Acute MI, inferolateral wall, initial episode of care (HCC) 06/12/2015  . Acute ST elevation myocardial infarction (STEMI) (HCC) 06/12/2015  . Aphasia complicating stroke (HCC) 02/09/2015  . Alterations of sensations following  CVA (cerebrovascular accident) 12/23/2014  . 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 Type 2 diabetes mellitus with peripheral neuropathy (HCC)   . Essential hypertension   . Alcohol withdrawal (HCC)   . Cerebral hemorrhage (HCC)   . Endotracheally intubated   . ICH (intracerebral hemorrhage) (HCC) 12/10/2014   Audreana Hancox B. DevolBueche, MSP, CCC-SLP  Leigh AuroraBueche, Brixton Schnapp Brown 03/10/2016, 2:59 PM  [facility] [address] [place], [place], [address] Phone: [phone]   Fax:  [phone]   Name: Richard [name] MRN: [id number] Date of Birth: 17-Oct-1945

## 2016-03-16 ENCOUNTER — Ambulatory Visit: Payer: PPO

## 2016-03-16 DIAGNOSIS — R482 Apraxia: Secondary | ICD-10-CM

## 2016-03-16 DIAGNOSIS — IMO0002 Reserved for concepts with insufficient information to code with codable children: Secondary | ICD-10-CM

## 2016-03-16 DIAGNOSIS — I639 Cerebral infarction, unspecified: Secondary | ICD-10-CM | POA: Diagnosis not present

## 2016-03-16 NOTE — Therapy (Signed)
[facility] [address] [place], [place], [address] Phone: [phone]   Fax:  [phone]  Speech Language Pathology Treatment  Patient Details  Name: Richard JakschLeon Juba MRN: [id number] Date of Birth: 1945/10/30 Referring Provider: Butch PennyMillikan, Megan, NP  Encounter Date: 03/16/2016      End of Session - 03/16/16 1445    Visit Number 7   Number of Visits 17   Date for SLP Re-Evaluation 05/13/16   SLP Start Time 1356   SLP Stop Time  1442   SLP Time Calculation (min) 46 min   Activity Tolerance Patient tolerated treatment well      Past Medical History:  Diagnosis Date  . CAD (coronary artery disease)    a. 05/2015 Inflat STEMI/PCI: LM nl, LAD 50p, Om1 100 (3.0 x 16mm Promus Premier DES), OM2/3 small, RCA 40p/d, EF 45-50%.  . Diabetes mellitus without complication (HCC)   . Diastolic dysfunction    a. 11/2014 Echo: EF 60-65%, gr1 DD, mildly dil RA.  . ETOH abuse   . H/O ventricular fibrillation    a. 05/2015 in setting of inferolateral STEMI.  . Hypertensive heart disease   . Intracranial hemorrhage (HCC)    a. 11/2014 in setting of marked HTN->3.5x2.5 left frontal ICH (12-9615ml by MRI); b. 11/2014 MRA no evidence of AVM or CAA; c. 11/2014 Carotid U/S: unremarkable.  . Left eye injury   . ST elevation MI (STEMI) (HCC) 06/12/15  . Stroke [facility](HCC)     Past Surgical History:  Procedure Laterality Date  . CARDIAC CATHETERIZATION N/A 06/12/2015   Procedure: Left Heart Cath and Coronary Angiography;  Surgeon: Lyn RecordsHenry W Smith, MD;  [facility];  Service: Cardiovascular;  Laterality: N/A;  . CORONARY ANGIOPLASTY      There were no vitals filed for this visit.      Subjective Assessment - 03/16/16 1403    Subjective Pt reports his reading comprehension is better now, with distractions.    Currently in Pain? No/denies               ADULT SLP TREATMENT - 03/16/16 1403      General Information    Behavior/Cognition Alert;Cooperative;Pleasant mood     Treatment Provided   Treatment provided Cognitive-Linquistic     Cognitive-Linquistic Treatment   Treatment focused on Aphasia;Apraxia   Skilled Treatment SLP facilitated pt's more fluent and non-distracting verbal expression in sentence, multi-sentence and conversational responses by first asking pt what he focuses on when slowing his rate of speech.  Pt with rare min A needed for reduced rate. In conversation in mod distracting environment, pt maintained slowed rate with min-mod cues rarely.  "[employer] [place]"/[employer], "next month"/next week     Assessment / Recommendations / Plan   Plan Continue with current plan of care     Progression Toward Goals   Progression toward goals Progressing toward goals            SLP Short Term Goals - 03/16/16 1448      SLP SHORT TERM GOAL #1   Title pt will produce sentence responses 90% success using compensations over three sessions   Status Achieved     SLP SHORT TERM GOAL #2   Title pt will engage in 5 minutes simple conversation WFL with modified independence (compensations) over three sessions   Status Achieved     SLP SHORT TERM GOAL #3   Title pt will provide 1-2 sentence responses to SLP verbal stimuli with  90% success using compensations   Status Achieved     SLP SHORT TERM GOAL #4   Title Pt will produce longer responses (7-10 words) in therapy with min cues, and verbalize use of longer responses in social situations.   Baseline Pt verbalizes tendency to limit responses (yes, no, 2-3 words) and shut down in social situations.   Time 5   Period Weeks   Status On-going     SLP SHORT TERM GOAL #5   Title Pt will engage in 15 minute conversation in distracting environment using strategies independently, with self-correction needed <10x   Baseline 8 self corrections noted in 10 minute conversation in quiet 1:1 environment   Time 5   Period Weeks   Status On-going           SLP Long Term Goals - 03/16/16 1449      SLP LONG TERM GOAL #1   Title pt will participate functionally in 10 minutes min-mod complex conversation using compensations during 3 sessions   Status Achieved     SLP LONG TERM GOAL #2   Title pt will engage in 8 minutes mod complex conversation with occasional min A for compensations   Status Achieved     SLP LONG TERM GOAL #3   Title Pt will verbalize satisfaction with communicative effectiveness with family and friends in a variety of social situations.   Baseline Pt limits interaction outside of home with wife and therapy with SLP.   Time 5   Period Weeks   Status On-going          Plan - 03/16/16 1446    Clinical Impression Statement Pt continues pleased with progress thus far. Pt noted to used strategies of decreased rate and overarticulation in both multisentence responses and in conversation in the gym (mod noisy/distracting).. Skilled ST cont necessary to ensure success of compensations over a wide avriety of speaking situations over time.   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 CAD (coronary artery disease) 06/22/2015  . Hypertensive heart disease 06/14/2015  . Hyperlipidemia 06/14/2015  . ST elevation (STEMI) myocardial infarction involving left circumflex coronary artery (HCC) 06/12/2015    Class: Acute   . Acute MI, inferolateral wall, initial episode of care (HCC) 06/12/2015  . Acute ST elevation myocardial infarction (STEMI) (HCC) 06/12/2015  . Aphasia complicating stroke (HCC) 02/09/2015  . 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 Type 2 diabetes mellitus with peripheral neuropathy (HCC)   . Essential hypertension   . Alcohol withdrawal (HCC)   . Cerebral hemorrhage (HCC)   . Endotracheally intubated   . ICH (intracerebral hemorrhage) (HCC) 12/10/2014    [facility]HINKE,CARL ,MS, CCC-SLP  03/16/2016, 2:49 PM  [facility] [address] [place], [place], [address] Phone: [phone]   Fax:  [phone]   Name: Richard JakschLeon [name] MRN: [id number] Date of Birth: 07/08/1945

## 2016-03-16 NOTE — Patient Instructions (Signed)
For at least the first three things you say to EVERYONE until we see you again, think about slowing down your rate of speech!

## 2016-03-24 ENCOUNTER — Ambulatory Visit: Payer: PPO

## 2016-03-24 DIAGNOSIS — IMO0002 Reserved for concepts with insufficient information to code with codable children: Secondary | ICD-10-CM

## 2016-03-24 DIAGNOSIS — R482 Apraxia: Secondary | ICD-10-CM

## 2016-03-24 DIAGNOSIS — I639 Cerebral infarction, unspecified: Secondary | ICD-10-CM | POA: Diagnosis not present

## 2016-03-24 NOTE — Therapy (Signed)
[facility] [address] [place], [place], [address] Phone: [phone]   Fax:  [phone]  Speech Language Pathology Treatment  Patient Details  Name: Richard JakschLeon Ovens MRN: [id number] Date of Birth: Sep 08, 1945 Referring Provider: Butch PennyMillikan, Megan, NP  Encounter Date: 03/24/2016      End of Session - 03/24/16 1644    Visit Number 8   Number of Visits 17   Date for SLP Re-Evaluation 05/13/16   SLP Start Time 1404   SLP Stop Time  1445   SLP Time Calculation (min) 41 min   Activity Tolerance Patient tolerated treatment well      Past Medical History:  Diagnosis Date  . CAD (coronary artery disease)    a. 05/2015 Inflat STEMI/PCI: LM nl, LAD 50p, Om1 100 (3.0 x 16mm Promus Premier DES), OM2/3 small, RCA 40p/d, EF 45-50%.  . Diabetes mellitus without complication (HCC)   . Diastolic dysfunction    a. 11/2014 Echo: EF 60-65%, gr1 DD, mildly dil RA.  . ETOH abuse   . H/O ventricular fibrillation    a. 05/2015 in setting of inferolateral STEMI.  . Hypertensive heart disease   . Intracranial hemorrhage (HCC)    a. 11/2014 in setting of marked HTN->3.5x2.5 left frontal ICH (12-8115ml by MRI); b. 11/2014 MRA no evidence of AVM or CAA; c. 11/2014 Carotid U/S: unremarkable.  . Left eye injury   . ST elevation MI (STEMI) (HCC) 06/12/15  . Stroke [facility](HCC)     Past Surgical History:  Procedure Laterality Date  . CARDIAC CATHETERIZATION N/A 06/12/2015   Procedure: Left Heart Cath and Coronary Angiography;  Surgeon: Lyn RecordsHenry W Smith, MD;  [facility];  Service: Cardiovascular;  Laterality: N/A;  . CORONARY ANGIOPLASTY      There were no vitals filed for this visit.      Subjective Assessment - 03/24/16 1319    Subjective "(My talking) was good (over Christmas)!" Pt expressed he was pleased with speaking with family over Christmas holiday time with family.   Currently in Pain? Yes   Pain Score 2    Pain Location Hand    Pain Orientation Right   Pain Descriptors / Indicators Aching;Tightness   Pain Type Chronic pain   Pain Onset More than a month ago   Pain Frequency Constant   Aggravating Factors  nothing of note   Pain Relieving Factors nothing               ADULT SLP TREATMENT - 03/24/16 1322      General Information   Behavior/Cognition Alert;Cooperative;Pleasant mood     Treatment Provided   Treatment provided Cognitive-Linquistic     Cognitive-Linquistic Treatment   Treatment focused on Aphasia;Apraxia   Skilled Treatment Pt conversed with SLP in therapy room with intelligibility at 90%+ without need for SLP questioning for clarificaiton from pt in mod complex conversation. In conversation in the gym over 10 minutes (mod complex), pt was pleased with his performance, and no SLP cues needed for rate reduction/compensations for aphasia/apraxia.  SLP pointed out to pt that his performance in conversation is better than January d/c from previous ST.      Assessment / Recommendations / Plan   Plan Continue with current plan of care     Progression Toward Goals   Progression toward goals Progressing toward goals            SLP Short Term Goals - 03/24/16 1647      SLP SHORT  TERM GOAL #1   Title pt will produce sentence responses 90% success using compensations over three sessions   Status Achieved     SLP SHORT TERM GOAL #2   Title pt will engage in 5 minutes simple conversation WFL with modified independence (compensations) over three sessions   Status Achieved     SLP SHORT TERM GOAL #3   Title pt will provide 1-2 sentence responses to SLP verbal stimuli with 90% success using compensations   Status Achieved     SLP SHORT TERM GOAL #4   Title Pt will produce longer responses (7-10 words) in therapy with min cues, and verbalize use of longer responses in social situations.   Baseline Pt verbalizes tendency to limit responses (yes, no, 2-3 words) and shut down in social  situations.   Time --   Period --   Status Achieved     SLP SHORT TERM GOAL #5   Title Pt will engage in 15 minute conversation in distracting environment using strategies independently, with self-correction needed <10x   Baseline 8 self corrections noted in 10 minute conversation in quiet 1:1 environment   Time 4   Period Weeks   Status On-going          SLP Long Term Goals - 03/24/16 1649      SLP LONG TERM GOAL #1   Title pt will participate functionally in 10 minutes min-mod complex conversation using compensations during 3 sessions   Status Achieved     SLP LONG TERM GOAL #2   Title pt will engage in 8 minutes mod complex conversation with occasional min A for compensations   Status Achieved     SLP LONG TERM GOAL #3   Title Pt will verbalize satisfaction with communicative effectiveness with family and friends in a variety of social situations.   Baseline Pt limits interaction outside of home with wife and therapy with SLP.   Time 4   Period Weeks   Status On-going          Plan - 03/24/16 1644    Clinical Impression Statement Pt continues pleased with progress thus far, and therapy moved to therapy gym again today. Pt used strategies of decreased rate and overarticulation in both multisentence responses and once again in conversation in gym, today without cues to do so. Skilled ST cont necessary to ensure success of compensations over a wide avriety of speaking situations over time. Therapy will be completed in other locations within the clinic to expose pt to more varied locations. Suspect d/c in next 1-2 weeks is possible.   Speech Therapy Frequency 2x / week   Duration 4 weeks  5 weeks   Treatment/Interventions Language facilitation;Compensatory techniques;Internal/external aids;SLP instruction and feedback;Multimodal communcation approach;Functional tasks;Patient/family education   Potential to Achieve Goals Good   Potential Considerations Previous level of  function;Severity of impairments   Consulted and Agree with Plan of Care Patient      Patient will benefit from skilled therapeutic intervention in order to improve the following deficits and impairments:   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 [facility])  Verbal apraxia    Problem List Patient Active Problem List   Diagnosis Date Noted  . CAD (coronary artery disease) 06/22/2015  . Hypertensive heart disease 06/14/2015  . Hyperlipidemia 06/14/2015  . ST elevation (STEMI) myocardial infarction involving left circumflex coronary artery (HCC) 06/12/2015    Class: Acute  . Acute MI, inferolateral wall, initial episode of care (HCC) 06/12/2015  . Acute ST elevation myocardial  infarction (STEMI) (HCC) 06/12/2015  . Aphasia complicating stroke (HCC) 02/09/2015  . 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 Type 2 diabetes mellitus with peripheral neuropathy (HCC)   . Essential hypertension   . Alcohol withdrawal (HCC)   . Cerebral hemorrhage (HCC)   . Endotracheally intubated   . ICH (intracerebral hemorrhage) (HCC) 12/10/2014    [facility] BryanCHINKE,Ganon Demasi ,MS, CCC-SLP  03/24/2016, 4:51 PM  [facility] [address] [place], [place], [address] Phone: [phone]   Fax:  [phone]   Name: Richard JakschLeon [name] MRN: [id number] Date of Birth: Feb 03, 1946

## 2016-03-25 ENCOUNTER — Ambulatory Visit: Payer: PPO

## 2016-03-30 ENCOUNTER — Ambulatory Visit: Payer: PPO | Attending: Adult Health

## 2016-03-30 DIAGNOSIS — R482 Apraxia: Secondary | ICD-10-CM | POA: Insufficient documentation

## 2016-03-30 DIAGNOSIS — R4701 Aphasia: Secondary | ICD-10-CM | POA: Diagnosis not present

## 2016-03-30 DIAGNOSIS — I639 Cerebral infarction, unspecified: Secondary | ICD-10-CM | POA: Insufficient documentation

## 2016-03-30 DIAGNOSIS — IMO0002 Reserved for concepts with insufficient information to code with codable children: Secondary | ICD-10-CM

## 2016-03-31 NOTE — Therapy (Signed)
[facility] [address] [place], [place], [address] Phone: [phone]   Fax:  [phone]  Speech Language Pathology Treatment  Patient Details  Name: Richard [name] MRN: [id number] Date of Birth: [birth year] Referring Provider: Butch PennyMillikan, Megan, NP  Encounter Date: 03/30/2016      End of Session - 03/30/16 1655    Visit Number 9   Number of Visits 17   Date for SLP Re-Evaluation 05/13/16   SLP Start Time 1449   SLP Stop Time  1530   SLP Time Calculation (min) 41 min   Activity Tolerance Patient tolerated treatment well      Past Medical History:  Diagnosis Date  . CAD (coronary artery disease)    a. 05/2015 Inflat STEMI/PCI: LM nl, LAD 50p, Om1 100 (3.0 x 16mm Promus Premier DES), OM2/3 small, RCA 40p/d, EF 45-50%.  . Diabetes mellitus without complication (HCC)   . Diastolic dysfunction    a. 11/2014 Echo: EF 60-65%, gr1 DD, mildly dil RA.  . ETOH abuse   . H/O ventricular fibrillation    a. 05/2015 in setting of inferolateral STEMI.  . Hypertensive heart disease   . Intracranial hemorrhage (HCC)    a. 11/2014 in setting of marked HTN->3.5x2.5 left frontal ICH (12-7415ml by MRI); b. 11/2014 MRA no evidence of AVM or CAA; c. 11/2014 Carotid U/S: unremarkable.  . Left eye injury   . ST elevation MI (STEMI) (HCC) 06/12/15  . Stroke [facility](HCC)     Past Surgical History:  Procedure Laterality Date  . CARDIAC CATHETERIZATION N/A 06/12/2015   Procedure: Left Heart Cath and Coronary Angiography;  Surgeon: Lyn RecordsHenry W Smith, MD;  [facility];  Service: Cardiovascular;  Laterality: N/A;  . CORONARY ANGIOPLASTY      There were no vitals filed for this visit.      Subjective Assessment - 03/30/16 1503    Subjective Pt shared that his brother in law died over the weekend - maintained slowed rate and 100% intelligibility.               ADULT SLP TREATMENT - 03/31/16 0001      General Information   Behavior/Cognition Alert;Cooperative;Pleasant mood     Treatment Provided   Treatment provided Cognitive-Linquistic     Cognitive-Linquistic Treatment   Treatment focused on Aphasia;Apraxia   Skilled Treatment SLP conversed with pt to habitualize compensatory strategies for aphasia/apraxia in a variety of contexts. Pt with WFL/WNL speech for 30 minutes in mod complex conversation. He used compensatory strategies independently and successfully x3.     Assessment / Recommendations / Plan   Plan Continue with current plan of care  d/c likely in 1-2 visits     Progression Toward Goals   Progression toward goals Progressing toward goals            SLP Short Term Goals - 03/31/16 0827      SLP SHORT TERM GOAL #1   Title pt will produce sentence responses 90% success using compensations over three sessions   Status Achieved     SLP SHORT TERM GOAL #2   Title pt will engage in 5 minutes simple conversation WFL with modified independence (compensations) over three sessions   Status Achieved     SLP SHORT TERM GOAL #3   Title pt will provide 1-2 sentence responses to SLP verbal stimuli with 90% success using compensations   Status Achieved     SLP SHORT TERM GOAL #4   Title  Pt will produce longer responses (7-10 words) in therapy with min cues, and verbalize use of longer responses in social situations.   Baseline Pt verbalizes tendency to limit responses (yes, no, 2-3 words) and shut down in social situations.   Status Achieved     SLP SHORT TERM GOAL #5   Title Pt will engage in 15 minute conversation in distracting environment using strategies independently, with self-correction needed <10x   Status Achieved          SLP Long Term Goals - 03/30/16 1501      SLP LONG TERM GOAL #1   Title pt will participate functionally in 10 minutes min-mod complex conversation using compensations during 3 sessions   Status Achieved     SLP LONG TERM GOAL #2   Title pt will engage in  8 minutes mod complex conversation with occasional min A for compensations   Status Achieved     SLP LONG TERM GOAL #3   Title Pt will verbalize satisfaction with communicative effectiveness with family and friends in a variety of social situations.   Baseline Pt limits interaction outside of home with wife and therapy with SLP.   Time 3   Period Weeks   Status On-going     SLP LONG TERM GOAL #4   Title Pt will demo [facility] verbal expression in 20 minutes mod complex conversation over two sessions    Baseline 03-30-16   Time 3   Period Weeks   Status On-going          Plan - 03/31/16 0826    Clinical Impression Statement Pt continues pleased with progress thus far, and therapy moved to therapy gym and to lobby today. Pt used strategies of decreased rate and overarticulation in conversation in both locations without cues to do so. Skilled ST cont necessary to ensure success of compensations over a wide avriety of speaking situations over time. Suspect d/c in next 1-2 visits.   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 CAD (coronary artery disease) 06/22/2015  . Hypertensive heart disease 06/14/2015  . Hyperlipidemia 06/14/2015  . ST elevation (STEMI) myocardial infarction involving left circumflex coronary artery (HCC) 06/12/2015    Class: Acute  . Acute MI, inferolateral  wall, initial episode of care (HCC) 06/12/2015  . Acute ST elevation myocardial infarction (STEMI) (HCC) 06/12/2015  . Aphasia complicating stroke (HCC) 02/09/2015  . 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 Type 2 diabetes mellitus with peripheral neuropathy (HCC)   . Essential hypertension   . Alcohol withdrawal (HCC)   . Cerebral hemorrhage (HCC)   . Endotracheally intubated   . ICH (intracerebral hemorrhage) (HCC) 12/10/2014    [facility] ,MS, CCC-SLP  03/31/2016, 8:28 AM  [facility] [address] [place], [place], [address] Phone: [phone]   Fax:  [phone]   Name: Richard [name] MRN: [id number] Date of Birth: March 29, 1945

## 2016-04-05 ENCOUNTER — Ambulatory Visit: Payer: PPO | Admitting: *Deleted

## 2016-04-05 DIAGNOSIS — I252 Old myocardial infarction: Secondary | ICD-10-CM | POA: Diagnosis not present

## 2016-04-05 DIAGNOSIS — E109 Type 1 diabetes mellitus without complications: Secondary | ICD-10-CM | POA: Diagnosis not present

## 2016-04-05 DIAGNOSIS — I69319 Unspecified symptoms and signs involving cognitive functions following cerebral infarction: Secondary | ICD-10-CM | POA: Diagnosis not present

## 2016-04-05 DIAGNOSIS — E7800 Pure hypercholesterolemia, unspecified: Secondary | ICD-10-CM | POA: Diagnosis not present

## 2016-04-05 DIAGNOSIS — I639 Cerebral infarction, unspecified: Secondary | ICD-10-CM | POA: Diagnosis not present

## 2016-04-05 DIAGNOSIS — R482 Apraxia: Secondary | ICD-10-CM

## 2016-04-05 DIAGNOSIS — I10 Essential (primary) hypertension: Secondary | ICD-10-CM | POA: Diagnosis not present

## 2016-04-05 DIAGNOSIS — Z794 Long term (current) use of insulin: Secondary | ICD-10-CM | POA: Diagnosis not present

## 2016-04-05 NOTE — Therapy (Signed)
[facility] [address] [place], [place], [address] Phone: [phone]   Fax:  [phone]  Speech Language Pathology Treatment  Patient Details  Name: Richard [name] MRN: [id number] Date of Birth: Mar 13, 1946 Referring Provider: Ward Givens, NP  Encounter Date: 04/05/2016      End of Session - 04/05/16 1617    Visit Number 10   Number of Visits 17   Date for SLP Re-Evaluation 05/13/16   SLP Start Time 1448   SLP Stop Time  1530   SLP Time Calculation (min) 42 min   Activity Tolerance Patient tolerated treatment well      Past Medical History:  Diagnosis Date  . CAD (coronary artery disease)    a. 05/2015 Inflat STEMI/PCI: LM nl, LAD 50p, Om1 100 (3.0 x 20m Promus Premier DES), OM2/3 small, RCA 40p/d, EF 45-50%.  . [place] complication [place]   . Diastolic dysfunction    a. 11/2014 Echo: EF 60-65%, gr1 DD, mildly dil RA.  . ETOH abuse   . H/O ventricular fibrillation    a. 05/2015 in setting of inferolateral STEMI.  . Hypertensive heart disease   . Intracranial hemorrhage ([place]    a. 11/2014 in setting of marked HTN->3.5x2.5 left frontal ICH (12-142mby MRI); b. 11/2014 MRA no evidence of AVM or CAA; c. 11/2014 Carotid U/S: unremarkable.  . Left eye injury   . ST [place] [facility] ([facility]I) ([facility]  . [facility]    Past Surgical History:  Procedure Laterality Date  . CARDIAC CATHETERIZATION N/A 06/12/2015   Procedure: Left Heart Cath and Coronary Angiography;  Surgeon: HeBelva CromeMD;  [facility];  Service: Cardiovascular;  Laterality: N/A;  . CORONARY ANGIOPLASTY      There were no vitals filed for this visit.      Subjective Assessment - 04/05/16 1522    Subjective "I am very pleased with my progress"   Currently in Pain? Yes   Pain Score 2    Pain Location Hand   Pain Orientation Right   Pain Descriptors / Indicators Aching;Tightness   Pain Type Chronic pain    Pain Onset More than a month ago               ADULT SLP TREATMENT - 04/05/16 0001      General Information   Behavior/Cognition Alert;Cooperative;Pleasant mood     Treatment Provided   Treatment provided Cognitive-Linquistic     Pain Assessment   Pain Assessment 0-10   Pain Score 2      Cognitive-Linquistic Treatment   Treatment focused on Aphasia;Apraxia;Patient/family/caregiver education   Skilled Treatment Pt engaged in conversation for over 30 minutes, and demonstrated independent application of strategies (slow rate, overarticulation), and self correction as needed. Pt placed upper partial (which he indicated results in deterioration of speech). Initial verbalizations were slightly altered, however, pt was able to acclimate to the change in biofeedback (not being able to feel the roof of his mouth with his tongue) very quickly. Pt reports he is reading more with good comprehension with distraction present. He reports feeling less self conscious about speaking to others, and is less inhibited when communicating. He indicates no shortness of breath with slow rate. Overall, pt reports he is very pleased with his progress in therapy. He realizes his communication may never be "normal", but he is willing to continue using strategies discussed in therapy going forward.     Assessment / Recommendations /  Plan   Plan Continue with current plan of care  anticipate DC next session     Progression Toward Goals   Progression toward goals Progressing toward goals          SLP Education - 04-30-16 1617    Education provided Yes   Education Details continuing implementation of strategies for fluency, intelligibility in a variety of social situations   Person(s) Educated Patient   Methods Explanation;Demonstration   Comprehension Verbalized understanding;Returned demonstration          SLP Short Term Goals - 2016-04-30 1620      SLP SHORT TERM GOAL #1   Title pt will  produce sentence responses 90% success using compensations over three sessions   Status Achieved     SLP SHORT TERM GOAL #2   Title pt will engage in 5 minutes simple conversation WFL with modified independence (compensations) over three sessions   Status Achieved     SLP SHORT TERM GOAL #3   Title pt will provide 1-2 sentence responses to SLP verbal stimuli with 90% success using compensations   Status Achieved     SLP SHORT TERM GOAL #4   Title Pt will produce longer responses (7-10 words) in therapy with min cues, and verbalize use of longer responses in social situations.   Status Achieved     SLP SHORT TERM GOAL #5   Title Pt will engage in 15 minute conversation in distracting environment using strategies independently, with self-correction needed <10x   Status Achieved          SLP Long Term Goals - April 30, 2016 1621      SLP LONG TERM GOAL #1   Title pt will participate functionally in 10 minutes min-mod complex conversation using compensations during 3 sessions   Status Achieved     SLP LONG TERM GOAL #2   Title pt will engage in 8 minutes mod complex conversation with occasional min A for compensations   Status Achieved     SLP LONG TERM GOAL #3   Title Pt will verbalize satisfaction with communicative effectiveness with family and friends in a variety of social situations.   Time 2   Period Weeks   Status Partially Met     SLP LONG TERM GOAL #4   Title Pt will demo [facility] verbal expression in 20 minutes mod complex conversation over two sessions    Baseline 03-30-16, 04/30/16   Status Achieved          Plan - 2016/04/30 1618    Clinical Impression Statement Pt verbalizes family and physicians have notice improvement in effective communication. Pt feels he has the strategies and tools necessary to continue applying them independently. Anticipate DC next session.   Treatment/Interventions Language facilitation;Compensatory techniques;Internal/external aids;SLP  instruction and feedback;Multimodal communcation approach;Functional tasks;Patient/family education   Potential to Achieve Goals Good   Potential Considerations Previous level of function;Severity of impairments   SLP Home Exercise Plan incorporating distractions, continuing independent use of strategies.   Consulted and Agree with Plan of Care Patient      Patient will benefit from skilled therapeutic intervention in order to improve the following deficits and impairments:   Verbal apraxia      G-Codes - 2016-04-30 1622    Functional Assessment Tool Used NOMS- 5 (25-30% impaired)   Functional Limitations Spoken language expressive   Spoken Language Expression Current Status (D6222) At least 1 percent but less than 20 percent impaired, limited or restricted   Spoken Language Expression  Goal Status [phone]) At least 1 percent but less than 20 percent impaired, limited or restricted      Problem List Patient Active Problem List   Diagnosis Date Noted  . CAD (coronary artery disease) 06/22/2015  . Hypertensive heart disease 06/14/2015  . Hyperlipidemia 06/14/2015  . ST elevation (STEMI) myocardial infarction involving left circumflex coronary artery ([place]) 06/12/2015    Class: Acute  . Acute MI, inferolateral wall, initial episode of care ([place]) 06/12/2015  . Acute ST elevation myocardial infarction (STEMI) ([place]) 06/12/2015  . Aphasia complicating stroke ([place]) 02/09/2015  . 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 Type 2 diabetes mellitus with peripheral neuropathy (HCC)   . Essential hypertension   . Alcohol withdrawal ([place])   . Cerebral hemorrhage ([place])   . Endotracheally intubated   . ICH (intracerebral hemorrhage) ([place]) 12/10/2014   Eesha Schmaltz B. Addison, MSP, CCC-SLP  Shonna Chock 04/05/2016, 4:22 PM  [place] [address] [place], [place], [address] Phone: [phone]   Fax:  [phone]   Name: Richard [name] MRN: [id number] Date of Birth: 20-Aug-1945

## 2016-04-08 ENCOUNTER — Ambulatory Visit: Payer: PPO

## 2016-04-08 DIAGNOSIS — I639 Cerebral infarction, unspecified: Secondary | ICD-10-CM | POA: Diagnosis not present

## 2016-04-08 DIAGNOSIS — IMO0002 Reserved for concepts with insufficient information to code with codable children: Secondary | ICD-10-CM

## 2016-04-08 DIAGNOSIS — R482 Apraxia: Secondary | ICD-10-CM

## 2016-04-08 NOTE — Therapy (Signed)
[facility] [address] [place], [place], [address] Phone: [phone]   Fax:  [phone]  Speech Language Pathology Treatment  Patient Details  Name: Richard [name] MRN: [id number] Date of Birth: Nov 02, 1945 Referring Provider: Ward Givens, NP  Encounter Date: 04/08/2016      End of Session - 04/08/16 1707    Visit Number 11   Number of Visits 17   Date for SLP Re-Evaluation 05/13/16   SLP Start Time 1450   SLP Stop Time  1534   SLP Time Calculation (min) 44 min   Activity Tolerance Patient tolerated treatment well      Past Medical History:  Diagnosis Date  . CAD (coronary artery disease)    a. 05/2015 Inflat STEMI/PCI: LM nl, LAD 50p, Om1 100 (3.0 x 21m Promus Premier DES), OM2/3 small, RCA 40p/d, EF 45-50%.  . Diabetes mellitus without complication ([place]   . Diastolic dysfunction    a. 11/2014 Echo: EF 60-65%, gr1 DD, mildly dil RA.  . ETOH abuse   . H/O ventricular fibrillation    a. 05/2015 in setting of inferolateral STEMI.  . Hypertensive heart disease   . Intracranial hemorrhage ([place]    a. 11/2014 in setting of marked HTN->3.5x2.5 left frontal ICH (12-173mby MRI); b. 11/2014 MRA no evidence of AVM or CAA; c. 11/2014 Carotid U/S: unremarkable.  . Left eye injury   . ST elevation MI (STEMI) ([facility]/17  . [facility]    Past Surgical History:  Procedure Laterality Date  . CARDIAC CATHETERIZATION N/A 06/12/2015   Procedure: Left Heart Cath and Coronary Angiography;  Surgeon: HeBelva CromeMD;  [facility];  Service: Cardiovascular;  Laterality: N/A;  . CORONARY ANGIOPLASTY      There were no vitals filed for this visit.      Subjective Assessment - 04/08/16 1503    Subjective "Well today's the last session."   Currently in Pain? Yes   Pain Score 1    Pain Location Hand   Pain Orientation Right   Pain Descriptors / Indicators Aching;Tightness   Pain Type Chronic pain                ADULT SLP TREATMENT - 04/08/16 1505      General Information   Behavior/Cognition Alert;Cooperative;Pleasant mood     Treatment Provided   Treatment provided Cognitive-Linquistic     Cognitive-Linquistic Treatment   Treatment focused on Aphasia;Apraxia   Skilled Treatment SLP engaged pt in conversation in three different locations in clinic to assess pt success with compensation use in many settings. Pt conversation was with reduced rate, 100% intelligible speech, and compensation for occasional word-finding difficulty, mostly in mod complex-complex conversation. Pt remarked three times he was pleased with therapy outcome.     Assessment / Recommendations / Plan   Plan Discharge SLP treatment due to (comment)  met goals     Progression Toward Goals   Progression toward goals Goals met, education completed, [id number]   Title pt will produce sentence responses 90% success using compensations over three sessions   Status Achieved     SLP SHORT TERM GOAL #2   Title pt will engage in 5 minutes simple conversation WFL with modified independence (compensations) over three sessions   Status Achieved  SLP SHORT TERM GOAL #3   Title pt will provide 1-2 sentence responses to SLP verbal stimuli with 90% success using compensations   Status Achieved     SLP SHORT TERM GOAL #4   Title Pt will produce longer responses (7-10 words) in therapy with min cues, and verbalize use of longer responses in social situations.   Status Achieved     SLP SHORT TERM GOAL #5   Title Pt will engage in 15 minute conversation in distracting environment using strategies independently, with self-correction needed <10x   Status Achieved          SLP Long Term Goals - 2016-04-15 1711      SLP LONG TERM GOAL #1   Title pt will participate functionally in 10 minutes min-mod complex  conversation using compensations during 3 sessions   Status Achieved     SLP LONG TERM GOAL #2   Title pt will engage in 8 minutes mod complex conversation with occasional min A for compensations   Status Achieved     SLP LONG TERM GOAL #3   Title Pt will verbalize satisfaction with communicative effectiveness with family and friends in a variety of social situations.   Status Achieved     SLP LONG TERM GOAL #4   Title Pt will demo [facility] verbal expression in 20 minutes mod complex conversation over two sessions    Baseline 03-30-16, 04-05-16   Status Achieved          Plan - Apr 15, 2016 1707    Clinical Impression Statement Pt continues pleased with progress thus far, remarking three times during therapy today that he is pleased with progress. Pt used strategies of decreased rate and overarticulation during the session, and with occasional word-finding in mod-max complex conversation in all locations without cues. Skilled ST will be formally d/c'd at this time and pt agrees with this.   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 CAD (coronary artery disease) 06/22/2015  . Hypertensive heart disease 06/14/2015  . Hyperlipidemia 06/14/2015  . ST elevation (STEMI) myocardial infarction involving left circumflex coronary artery ([place]) 06/12/2015    Class: Acute  . Acute MI, inferolateral wall, initial episode of care ([place] [facility] [place]) 06/12/2015  . Acute ST elevation myocardial infarction (STEMI) ([place]) 06/12/2015  . Aphasia complicating stroke ([place]) 02/09/2015  . 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 Type 2 diabetes mellitus with peripheral neuropathy (HCC)   . Essential hypertension   . Alcohol withdrawal ([place])   .  Cerebral hemorrhage ([place])   . Endotracheally intubated   . ICH (intracerebral hemorrhage) ([place]) 12/10/2014    [facility] ,[place], [place]  04/08/2016, 5:14 PM  [facility] [address] [place], [place], [address] Phone: [phone]   Fax:  [phone]   Name: Richard [name] MRN: [id number] Date of Birth: Apr 18, 1945

## 2016-04-12 ENCOUNTER — Ambulatory Visit: Payer: PPO | Admitting: *Deleted

## 2016-04-19 ENCOUNTER — Encounter: Payer: PPO | Admitting: *Deleted

## 2016-04-26 ENCOUNTER — Encounter: Payer: PPO | Admitting: *Deleted

## 2016-08-02 ENCOUNTER — Encounter (HOSPITAL_COMMUNITY): Payer: Self-pay | Admitting: Interventional Cardiology

## 2016-08-02 NOTE — Progress Notes (Signed)
Cardiology Office Note    Date:  08/03/2016   ID:  Richard [name], DOB 07-Nov-1945, MRN [id number]  PCP:  Elias Else, MD  Cardiologist: Lesleigh Noe, MD   Chief Complaint  Patient presents with  . Coronary Artery Disease    History of Present Illness:  Richard [name] is a 71 y.o. male with a prior h/o HTN, ETOH abuse, and left frontal intracranial hemorrhage in 11/2014. Additionally, the patient was admitted to the hospital with a STEMI and upon arrival he suffered V. fib requiring defibrillation 1 in brief period of CPR. He underwent emergency cardiac catheterization and underwent successful stenting to the OM1 with a drug-eluting stent. EF 45-50%  He is doing relatively well. He denies chest discomfort or symptoms consistent with angina. He denies peripheral edema. He has not needed nitroglycerin. No side effects to the current medication regimen for his heart.   Past Medical History:  Diagnosis Date  . CAD (coronary artery disease)    a. 05/2015 Inflat STEMI/PCI: LM nl, LAD 50p, Om1 100 (3.0 x 16mm Promus Premier DES), OM2/3 small, RCA 40p/d, EF 45-50%.  . Diabetes mellitus without complication (HCC)   . Diastolic dysfunction    a. 11/2014 Echo: EF 60-65%, gr1 DD, mildly dil RA.  . ETOH abuse   . H/O ventricular fibrillation    a. 05/2015 in setting of inferolateral STEMI.  . Hypertensive heart disease   . Intracranial hemorrhage (HCC)    a. 11/2014 in setting of marked HTN->3.5x2.5 left frontal ICH (12-30ml by MRI); b. 11/2014 MRA no evidence of AVM or CAA; c. 11/2014 Carotid U/S: unremarkable.  . Left eye injury   . ST elevation MI (STEMI) (HCC) 06/12/15  . Stroke [facility])     Past Surgical History:  Procedure Laterality Date  . CORONARY ANGIOPLASTY    . LEFT HEART CATH AND CORONARY ANGIOGRAPHY N/A 06/12/2015   Procedure: Left Heart Cath and Coronary Angiography;  Surgeon: Lyn Records, MD;  Location: [facility] INVASIVE CV LAB;  Service: Cardiovascular;  Laterality: N/A;     Current Medications: Outpatient Medications Prior to Visit  Medication Sig Dispense Refill  . amLODipine (NORVASC) 10 MG tablet Take 1 tablet (10 mg total) by mouth daily. 30 tablet 1  . aspirin EC 81 MG tablet Take 81 mg by mouth daily.    . carvedilol (COREG) 6.25 MG tablet Take 1 tablet (6.25 mg total) by mouth 2 (two) times daily with a meal. 60 tablet 6  . clopidogrel (PLAVIX) 75 MG tablet Take 1 tablet (75 mg total) by mouth daily with breakfast. 30 tablet 6  . insulin NPH Human (HUMULIN N,NOVOLIN N) 100 UNIT/ML injection Inject into the skin. Takes 40 units in the am and 30 units in the pm, RN from [facility] call pharmacy its a different brand name, patient takes this brand t/c call made 05/13/2015    . insulin regular (NOVOLIN R,HUMULIN R) 100 units/mL injection Inject into the skin twice daily as needed per sliding scale.    Marland Kitchen lisinopril-hydrochlorothiazide (PRINZIDE,ZESTORETIC) 20-12.5 MG tablet Take 2 tablets by mouth daily.     . Multiple Vitamin (MULTIVITAMIN WITH MINERALS) TABS tablet Take 1 tablet by mouth daily.    . nitroGLYCERIN (NITROSTAT) 0.4 MG SL tablet Place 1 tablet (0.4 mg total) under the tongue every 5 (five) minutes as needed for chest pain. 25 tablet 3  . atorvastatin (LIPITOR) 80 MG tablet Take 1 tablet (80 mg total) by mouth daily at 6 PM. (Patient not  taking: Reported on 08/03/2016) 30 tablet 6   No facility-administered medications prior to visit.      Allergies:   Patient has no known allergies.   Social History   Social History  . Marital status: Married    Spouse name: N/A  . Number of children: N/A  . Years of education: N/A   Social History Main Topics  . Smoking status: Never Smoker  . Smokeless tobacco: Never Used  . Alcohol use No     Comment: prev heavy  . Drug use: No  . Sexual activity: Not Asked   Other Topics Concern  . None   Social History Narrative   [place].     Family History:  The patient's family history  includes Healthy in his mother; Other in his father; Stroke in his maternal grandmother.   ROS:   Please see the history of present illness.    Still frustrated by difficulty with speech as a result of his stroke.  All other systems reviewed and are negative.   PHYSICAL EXAM:   VS:  BP 104/68 (BP Location: Left Arm)   Pulse 74   Ht 6' (1.829 m)   Wt 203 lb 3.2 oz (92.2 kg)   BMI 27.56 kg/m    GEN: Well nourished, well developed, in no acute distress  HEENT: normal  Neck: no JVD, carotid bruits, or masses Cardiac: RRR; no murmurs, rubs, or gallops,no edema  Respiratory:  clear to auscultation bilaterally, normal work of breathing GI: soft, nontender, nondistended, + BS MS: no deformity or atrophy  Skin: warm and dry, no rash Neuro:  Alert and Oriented x 3. Ptosis of left eye lid. Expressive aphasia. Psych: euthymic mood, full affect  Wt Readings from Last 3 Encounters:  08/03/16 203 lb 3.2 oz (92.2 kg)  02/10/16 201 lb (91.2 kg)  02/03/16 199 lb 12.8 oz (90.6 kg)      Studies/Labs Reviewed:   EKG:  EKG  no  Recent Labs: 09/21/2015: ALT 15   Lipid Panel    Component Value Date/Time   CHOL 202 (H) 09/21/2015 1241   TRIG 99 09/21/2015 1241   HDL 38 (L) 09/21/2015 1241   CHOLHDL 5.3 (H) 09/21/2015 1241   VLDL 20 09/21/2015 1241   LDLCALC 144 (H) 09/21/2015 1241    Additional studies/ records that were reviewed today include:  Acute intervention/PCI during infarction March 2017: Coronary Diagrams   Diagnostic Diagram       Post-Intervention Diagram           ASSESSMENT:    1. 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 Type 2 diabetes mellitus with peripheral neuropathy (HCC)   3. Essential hypertension   4. Cerebral hemorrhage (HCC)   5. Mixed hyperlipidemia      PLAN:  In order of problems listed above:  1. Coronary artery disease is stable. No clinical evidence of stent thrombosis or recurrent angina. No significant residual coronary disease is noted.  LDL needs to be treated to target of 70 or less.Okay to discontinue Plavix from cardiac standpoint but will need to check with neurology. 2. Followed by primary care 3. Excellent control on the current medical regimen 4. Not addressed. 5. LDL less than 70. A lipid needs to be done this year. Last done in June 2017. Overall doing relatively well. Clinical follow-up with me in 6-9 months. A lipid panel and liver panel needs to be done this summer.   Medication Adjustments/Labs and Tests Ordered:  Current medicines are reviewed at length with the patient today.  Concerns regarding medicines are outlined above.  Medication changes, Labs and Tests ordered today are listed in the Patient Instructions below. Patient Instructions  Medication Instructions:  None  Labwork: None  Testing/Procedures: None  Follow-Up: Your physician wants you to follow-up in: 6 months with Dr. Katrinka Blazing. You will receive a reminder letter in the mail two months in advance. If you don't receive a letter, please call our office to schedule the follow-up appointment.   Any Other Special Instructions Will Be Listed Below (If Applicable).     If you need a refill on your cardiac medications before your next appointment, please call your pharmacy.      Signed, Lesleigh Noe, MD  08/03/2016 9:37 AM    [facility] [address] [place], [place], [place]  [address] Phone: [phone]; Fax: [phone]

## 2016-08-03 ENCOUNTER — Ambulatory Visit (INDEPENDENT_AMBULATORY_CARE_PROVIDER_SITE_OTHER): Payer: PPO | Admitting: Interventional Cardiology

## 2016-08-03 ENCOUNTER — Ambulatory Visit: Payer: PPO | Admitting: Interventional Cardiology

## 2016-08-03 ENCOUNTER — Encounter: Payer: Self-pay | Admitting: Interventional Cardiology

## 2016-08-03 VITALS — BP 104/68 | HR 74 | Ht 72.0 in | Wt 203.2 lb

## 2016-08-03 DIAGNOSIS — I619 Nontraumatic intracerebral hemorrhage, unspecified: Secondary | ICD-10-CM | POA: Diagnosis not present

## 2016-08-03 DIAGNOSIS — E782 Mixed hyperlipidemia: Secondary | ICD-10-CM | POA: Diagnosis not present

## 2016-08-03 DIAGNOSIS — E1142 Type 2 diabetes mellitus with diabetic polyneuropathy: Secondary | ICD-10-CM | POA: Diagnosis not present

## 2016-08-03 DIAGNOSIS — I252 Old myocardial infarction: Secondary | ICD-10-CM | POA: Diagnosis not present

## 2016-08-03 DIAGNOSIS — I10 Essential (primary) hypertension: Secondary | ICD-10-CM

## 2016-08-03 NOTE — Addendum Note (Signed)
Addended by: Julio SicksBOWERS, Murle Otting L on: 08/03/2016 03:43 PM   Modules accepted: Orders

## 2016-08-03 NOTE — Patient Instructions (Signed)
Medication Instructions:  None  Labwork: None  Testing/Procedures: None  Follow-Up: Your physician wants you to follow-up in: 6 months with Dr. Smith.  You will receive a reminder letter in the mail two months in advance. If you don't receive a letter, please call our office to schedule the follow-up appointment.   Any Other Special Instructions Will Be Listed Below (If Applicable).     If you need a refill on your cardiac medications before your next appointment, please call your pharmacy.   

## 2016-08-04 ENCOUNTER — Other Ambulatory Visit: Payer: PPO

## 2016-08-05 ENCOUNTER — Other Ambulatory Visit: Payer: PPO | Admitting: *Deleted

## 2016-08-05 DIAGNOSIS — E782 Mixed hyperlipidemia: Secondary | ICD-10-CM

## 2016-08-06 LAB — HEPATIC FUNCTION PANEL
ALBUMIN: 4.2 g/dL (ref 3.5–4.8)
ALK PHOS: 74 IU/L (ref 39–117)
ALT: 16 IU/L (ref 0–44)
AST: 16 IU/L (ref 0–40)
BILIRUBIN TOTAL: 1.3 mg/dL — AB (ref 0.0–1.2)
Bilirubin, Direct: 0.27 mg/dL (ref 0.00–0.40)
TOTAL PROTEIN: 6.7 g/dL (ref 6.0–8.5)

## 2016-08-06 LAB — LIPID PANEL
CHOLESTEROL TOTAL: 146 mg/dL (ref 100–199)
Chol/HDL Ratio: 3.9 ratio (ref 0.0–5.0)
HDL: 37 mg/dL — ABNORMAL LOW (ref >39)
LDL Calculated: 78 mg/dL (ref 0–99)
Triglycerides: 156 mg/dL — ABNORMAL HIGH (ref 0–149)
VLDL Cholesterol Cal: 31 mg/dL (ref 5–40)

## 2016-08-15 ENCOUNTER — Ambulatory Visit: Payer: PPO | Admitting: Neurology

## 2016-08-29 ENCOUNTER — Encounter: Payer: Self-pay | Admitting: Neurology

## 2016-08-29 ENCOUNTER — Ambulatory Visit (INDEPENDENT_AMBULATORY_CARE_PROVIDER_SITE_OTHER): Payer: PPO | Admitting: Neurology

## 2016-08-29 VITALS — BP 134/81 | HR 80 | Wt 203.2 lb

## 2016-08-29 DIAGNOSIS — I6990 Unspecified sequelae of unspecified cerebrovascular disease: Secondary | ICD-10-CM

## 2016-08-29 NOTE — Progress Notes (Signed)
[facility] [address] [place]. [place] [id number] [phone]       OFFICE FOLLOW-UP NOTE  Mr. Richard JakschLeon [name] Date of Birth:  1946-01-31 Medical Record Number:  [id number]   HPI: Office f/u 05/12/2015 : [age] year African-American male seen today for the first office follow-up visit following intracerebral hemorrhage in September 2016 Richard [name] is a 71 y.o. male, right handed, with a past medical history significant for DM, stroke without residual deficits, s/p artificial left eye, brought in for evaluation of the aforementioned symptoms/signs.  Patient `s son  at the bedside indicated that his father was last seen normal around 11 pm  Night prior to admission, but when he woke up  Next morning he was not able to speak fluently and he noticed weakness of the right side of his face, and thus he became concerned and brought patient to the ED. Upon arrival to the ED, patient was described as awake and alert but aphasic with right face weakness and SBP>200. Urgent CT brain was obtained, personally reviewed, and showed evidence of a lobar left frontal ICH measuring 3.5 x 2.5 cm transverse, with an estimated ICH volume of approximately of 17.5 ml. There was mild surrounding vasogenic edema but no IV extension or hydrocephalus.   He was started on nicardipine drip as his BP remains >190. Serologies reviewed: PTT 27, INR 1.02, platelets 238. He was admitted to the intensive care unit with white blood pressure control, close neurological monitoring and placed on alcohol withdrawal precautions. Follow-up CT scan showed stable appearance of the hematoma without any extension normal expansion. Carotid Doppler showed no significant expectoration stenosis. Transthoracic echo showed normal ejection fraction. LDL cholesterol was 102 mg percent and hemoglobin A1c was 7.5. Patient did well and was transferred to the floor and subsequently to inpatient rehabilitation. He has done well and states his  made near complete neurological recovery. He still has some diminished fine motor skills in his right hand as well as his right leg tires easily. He also has some occasional word finding difficulties particularly when he gets excited and tries to talk fast. He has finished inpatient rehabilitation as well as home therapies and is currently participating in outpatient speech therapy. He recently finished outpatient physical and occupational therapies. He states his blood pressure is well controlled and today it is 141/87. He has no new complaints today. Update 08/29/2016 ; he returns for follow-up today after last office follow-up visit on 02/10/16. Decongestant do well without recurrent stroke or TIA symptoms. He however states that still has residual right hand diminished fine motor skills weakness as well as some intermittent right leg weakness and gait and balance difficulties. Is unable to walk long distances and his right leg up gives out. Is requesting a handicap parking sticker. Is currently on aspirin and Plavix C had a myocardial infarction March 2017 requiring cardiac stent. He however plans to see his cardiologist likely stop Plavix. His blood pressure is well controlled and today it is 134/81. States his diabetes is well controlled on last A1c January 7 6.8. Is tolerating aspirin and Plavix with only minor bruising. He has not had any recurrent strokes since his left frontal lobar hemorrhage in September 2016. His blood pressure was uncontrolled at that time but since then has been quite well controlled ROS:   14 system review of systems is positive for  insomnia, walking difficulty, word finding difficulty, speech difficulty, right leg weakness only and all other systems negative PMH:  Past  Medical History  Diagnosis Date  . Diabetes mellitus without complication (HCC)   . Stroke (HCC)   . Left eye injury Left frontal intracerebral hemorrhage September 2016  Hypertension      Social History:   Social History   Social History  . Marital status: Married    Spouse name: N/A  . Number of children: N/A  . Years of education: N/A   Occupational History  . Not on file.   Social History Main Topics  . Smoking status: Never Smoker  . Smokeless tobacco: Never Used  . Alcohol use No     Comment: prev heavy  . Drug use: No  . Sexual activity: Not on file   Other Topics Concern  . Not on file   Social History Narrative   Lives in [place] with wife.    Medications:   Current Outpatient Prescriptions on File Prior to Visit  Medication Sig Dispense Refill  . amLODipine (NORVASC) 10 MG tablet Take 1 tablet (10 mg total) by mouth daily. 30 tablet 1  . aspirin EC 81 MG tablet Take 81 mg by mouth daily.    Marland Kitchen atorvastatin (LIPITOR) 40 MG tablet Take 80 mg by mouth daily.    . carvedilol (COREG) 6.25 MG tablet Take 1 tablet (6.25 mg total) by mouth 2 (two) times daily with a meal. 60 tablet 6  . clopidogrel (PLAVIX) 75 MG tablet Take 1 tablet (75 mg total) by mouth daily with breakfast. 30 tablet 6  . insulin NPH Human (HUMULIN N,NOVOLIN N) 100 UNIT/ML injection Inject into the skin. Takes 40 units in the am and 30 units in the pm, RN from [facility] call pharmacy its a different brand name, patient takes this brand t/c call made 05/13/2015    . insulin regular (NOVOLIN R,HUMULIN R) 100 units/mL injection Inject into the skin twice daily as needed per sliding scale.    Marland Kitchen lisinopril-hydrochlorothiazide (PRINZIDE,ZESTORETIC) 20-12.5 MG tablet Take 2 tablets by mouth daily.     . Multiple Vitamin (MULTIVITAMIN WITH MINERALS) TABS tablet Take 1 tablet by mouth daily.    . nitroGLYCERIN (NITROSTAT) 0.4 MG SL tablet Place 1 tablet (0.4 mg total) under the tongue every 5 (five) minutes as needed for chest pain. 25 tablet 3   No current facility-administered medications on file prior to visit.     Allergies:  No Known Allergies  Physical Exam General: well developed, well nourished elderly  African-American male, seated, in no evident distress Head: head normocephalic and atraumatic.  Neck: supple with no carotid or supraclavicular bruits Cardiovascular: regular rate and rhythm, no murmurs Musculoskeletal: no deformity Skin:  no rash/petichiae Vascular:  Normal pulses all extremities Vitals:   08/29/16 1011  BP: 134/81  Pulse: 80   Neurologic Exam Mental Status: Awake and fully alert. Oriented to place and time. Recent and remote memory intact. Attention span, concentration and fund of knowledge appropriate. Mood and affect appropriate.  Cranial Nerves: Fundoscopic exam reveals sharp disc margin in the right eye. Left eye is artificial.. Pupils  small, briskly reactive to light on right and cannot test on the left.. Extraocular movements full without nystagmus in the right eye and left eye is artificial. Left eye mechanical ptosis. Visual fields full to confrontation in the right eye. Hearing intact. Facial sensation intact. Face, tongue, palate moves normally and symmetrically.  Motor: Normal bulk and tone. Normal strength in all tested extremity muscles But mild weakness of right grip and intrinsic hand muscles. Orbits left-to-right approximately.. Sensory.:  intact to touch ,pinprick .position and vibratory sensation.  Coordination: Rapid alternating movements normal in all extremities. Finger-to-nose and heel-to-shin performed accurately bilaterally. Unable to stand on either foot unsupported. Gait and Station: Arises from chair without difficulty. Stance is normal. Gait demonstrates normal stride length and balance . Unable to heel, toe and tandem walk  .  Reflexes: 1+ and symmetric. Toes downgoing.      ASSESSMENT: 70 year old African-American male with left frontal parenchymal hemorrhage in September 2016 etiology unclear hemorrhagic infarct versus primary hemorrhage due to hypertension. Patient is doing neurologically quite well.    PLAN: I had a long d/w patient  about his remote hemorrhagic stroke,mild residual right hemiparesis and gait difficulty, risk for recurrent stroke/TIAs, personally independently reviewed imaging studies and stroke evaluation results and answered questions.Continue aspirin 81 mg daily  for secondary stroke prevention  and stop Plavix after discussion with his cardiologist and maintain strict control of hypertension with blood pressure goal below 130/90, diabetes with hemoglobin A1c goal below 6.5% and lipids with LDL cholesterol goal below 70 mg/dL. I also advised the patient to eat a healthy diet with plenty of whole grains, cereals, fruits and vegetables, exercise regularly and maintain ideal body weight . The patient is still having residual gait and balance difficulties and was given a handicap parking's sticker upon his request. Greater than 50% time during this 25 minute visit was spent on counseling and coordination of care about his remote intracerebral hemorrhage, post stroke weakness and gait difficulties in answering questions No routine scheduled follow-up appointment with me is necessary at he may be referred back in the future by his primary physician as needed Delia Heady, MD  Note: This document was prepared with digital dictation and possible smart phrase technology. Any transcriptional errors that result from this process are unintentional

## 2016-08-29 NOTE — Patient Instructions (Signed)
I had a long d/w patient about his remote hemorrhagic stroke,mild residual right hemiparesis and gait difficulty, risk for recurrent stroke/TIAs, personally independently reviewed imaging studies and stroke evaluation results and answered questions.Continue aspirin 81 mg daily  for secondary stroke prevention  and stop Plavix after discussion with his cardiologist and maintain strict control of hypertension with blood pressure goal below 130/90, diabetes with hemoglobin A1c goal below 6.5% and lipids with LDL cholesterol goal below 70 mg/dL. I also advised the patient to eat a healthy diet with plenty of whole grains, cereals, fruits and vegetables, exercise regularly and maintain ideal body weight . The patient is still having residual gait and balance difficulties and was given a handicap parking's take her upon his request. No routine scheduled follow-up appointment with me is necessary at he may be referred back in the future by his primary physician as needed

## 2016-09-02 NOTE — Progress Notes (Signed)
Patient given permanent handicap sticker from Dr. Pearlean BrownieSethi. Copy sent to medical records. Pt is to follow up as needed.

## 2016-11-08 DIAGNOSIS — E1065 Type 1 diabetes mellitus with hyperglycemia: Secondary | ICD-10-CM | POA: Diagnosis not present

## 2016-11-08 DIAGNOSIS — I69322 Dysarthria following cerebral infarction: Secondary | ICD-10-CM | POA: Diagnosis not present

## 2016-11-08 DIAGNOSIS — F1021 Alcohol dependence, in remission: Secondary | ICD-10-CM | POA: Diagnosis not present

## 2016-11-08 DIAGNOSIS — I69319 Unspecified symptoms and signs involving cognitive functions following cerebral infarction: Secondary | ICD-10-CM | POA: Diagnosis not present

## 2016-11-08 DIAGNOSIS — Z125 Encounter for screening for malignant neoplasm of prostate: Secondary | ICD-10-CM | POA: Diagnosis not present

## 2016-11-08 DIAGNOSIS — I10 Essential (primary) hypertension: Secondary | ICD-10-CM | POA: Diagnosis not present

## 2016-11-08 DIAGNOSIS — Z1389 Encounter for screening for other disorder: Secondary | ICD-10-CM | POA: Diagnosis not present

## 2016-11-08 DIAGNOSIS — I252 Old myocardial infarction: Secondary | ICD-10-CM | POA: Diagnosis not present

## 2016-11-08 DIAGNOSIS — E7800 Pure hypercholesterolemia, unspecified: Secondary | ICD-10-CM | POA: Diagnosis not present

## 2016-11-08 DIAGNOSIS — Z1159 Encounter for screening for other viral diseases: Secondary | ICD-10-CM | POA: Diagnosis not present

## 2016-11-08 DIAGNOSIS — Z0000 Encounter for general adult medical examination without abnormal findings: Secondary | ICD-10-CM | POA: Diagnosis not present

## 2016-11-08 DIAGNOSIS — G4700 Insomnia, unspecified: Secondary | ICD-10-CM | POA: Diagnosis not present

## 2016-11-17 DIAGNOSIS — H524 Presbyopia: Secondary | ICD-10-CM | POA: Diagnosis not present

## 2016-11-17 DIAGNOSIS — H04121 Dry eye syndrome of right lacrimal gland: Secondary | ICD-10-CM | POA: Diagnosis not present

## 2016-11-17 DIAGNOSIS — H35031 Hypertensive retinopathy, right eye: Secondary | ICD-10-CM | POA: Diagnosis not present

## 2016-11-17 DIAGNOSIS — H2511 Age-related nuclear cataract, right eye: Secondary | ICD-10-CM | POA: Diagnosis not present

## 2016-11-17 DIAGNOSIS — H401112 Primary open-angle glaucoma, right eye, moderate stage: Secondary | ICD-10-CM | POA: Diagnosis not present

## 2016-12-07 DIAGNOSIS — H2511 Age-related nuclear cataract, right eye: Secondary | ICD-10-CM | POA: Diagnosis not present

## 2016-12-07 DIAGNOSIS — H35031 Hypertensive retinopathy, right eye: Secondary | ICD-10-CM | POA: Diagnosis not present

## 2016-12-07 DIAGNOSIS — H401112 Primary open-angle glaucoma, right eye, moderate stage: Secondary | ICD-10-CM | POA: Diagnosis not present

## 2016-12-07 DIAGNOSIS — H04121 Dry eye syndrome of right lacrimal gland: Secondary | ICD-10-CM | POA: Diagnosis not present

## 2016-12-07 DIAGNOSIS — E119 Type 2 diabetes mellitus without complications: Secondary | ICD-10-CM | POA: Diagnosis not present

## 2017-01-27 ENCOUNTER — Telehealth: Payer: Self-pay | Admitting: Interventional Cardiology

## 2017-01-27 NOTE — Telephone Encounter (Signed)
I spoke with patient, he received recall letter to see Dr Katrinka BlazingSmith in Nov, was told no openings until Jan 2019 with Dr Katrinka BlazingSmith.  Pt wants to discuss medications,  agreed to see Nada BoozerLaura Ingold, NP and is scheduled 02/23/17 8:30 AM.

## 2017-01-27 NOTE — Telephone Encounter (Signed)
New Message     Pt states he received a letter from the office stating he should come in on 02/14/17. Pt states he does no want to see PA, that he was to see Dr Katrinka BlazingSmith regarding stopping his medication

## 2017-02-08 DIAGNOSIS — Z1211 Encounter for screening for malignant neoplasm of colon: Secondary | ICD-10-CM | POA: Diagnosis not present

## 2017-02-08 DIAGNOSIS — Z6826 Body mass index (BMI) 26.0-26.9, adult: Secondary | ICD-10-CM | POA: Diagnosis not present

## 2017-02-08 DIAGNOSIS — Z794 Long term (current) use of insulin: Secondary | ICD-10-CM | POA: Diagnosis not present

## 2017-02-08 DIAGNOSIS — I10 Essential (primary) hypertension: Secondary | ICD-10-CM | POA: Diagnosis not present

## 2017-02-08 DIAGNOSIS — E1065 Type 1 diabetes mellitus with hyperglycemia: Secondary | ICD-10-CM | POA: Diagnosis not present

## 2017-02-13 ENCOUNTER — Encounter: Payer: Self-pay | Admitting: Cardiology

## 2017-02-22 NOTE — Progress Notes (Signed)
Cardiology Office Note   Date:  02/23/2017   ID:  Richard [name], DOB [birth year], MRN [id number]  PCP:  Elias Elseeade, Robert, MD  Cardiologist:  Dr. Katrinka BlazingSmith    Chief Complaint  Patient presents with  . Coronary Artery Disease    to come off plavix       History of Present Illness: Richard [name] is a 71 y.o. male who presents for CAD  He has a prior h/o HTN, ETOH abuse, and left frontal intracranial hemorrhage in 11/2014.Additionally, the patient was admitted to the hospital with a STEMI-inf wall,  and upon arrival he suffered V. fib requiring defibrillation 1 in brief period of CPR. He underwent emergency cardiac catheterization and underwent successful stenting to the OM1 with a drug-eluting stent. EF 45-50%  Continue ASA and stop Plavix per both Dr. Katrinka BlazingSmith and Dr. Pearlean BrownieSethi.  LDL on last check in May was 2878.  Pt had low BP and PCP decreased lisinopril hctz.  He is feeling better and BP well controlled.  Has not yet had flu vaccine, he will contact Dr. Nicholos Johnseade.  No chest pain and no SOB, now that his BP is improved he will begin walking again.    Past Medical History:  Diagnosis Date  . CAD (coronary artery disease)    a. 05/2015 Inflat STEMI/PCI: LM nl, LAD 50p, Om1 100 (3.0 x 16mm Promus Premier DES), OM2/3 small, RCA 40p/d, EF 45-50%.  . Diabetes mellitus without complication (HCC)   . Diastolic dysfunction    a. 11/2014 Echo: EF 60-65%, gr1 DD, mildly dil RA.  . ETOH abuse   . H/O ventricular fibrillation    a. 05/2015 in setting of inferolateral STEMI.  . Hypertensive heart disease   . Intracranial hemorrhage (HCC)    a. 11/2014 in setting of marked HTN->3.5x2.5 left frontal ICH (12-6715ml by MRI); b. 11/2014 MRA no evidence of AVM or CAA; c. 11/2014 Carotid U/S: unremarkable.  . Left eye injury   . ST elevation MI (STEMI) (HCC) 06/12/15  . Stroke [facility](HCC)     Past Surgical History:  Procedure Laterality Date  . CORONARY ANGIOPLASTY    . LEFT HEART CATH AND CORONARY ANGIOGRAPHY N/A 06/12/2015     Procedure: Left Heart Cath and Coronary Angiography;  Surgeon: Lyn RecordsSmith, Henry W, MD;  [facility];  Service: Cardiovascular;  Laterality: N/A;     Current Outpatient Medications  Medication Sig Dispense Refill  . amLODipine (NORVASC) 10 MG tablet Take 1 tablet (10 mg total) by mouth daily. 30 tablet 1  . aspirin EC 81 MG tablet Take 81 mg by mouth daily.    Marland Kitchen. atorvastatin (LIPITOR) 40 MG tablet Take 80 mg by mouth daily.    . carvedilol (COREG) 6.25 MG tablet Take 1 tablet (6.25 mg total) by mouth 2 (two) times daily with a meal. 60 tablet 6  . clopidogrel (PLAVIX) 75 MG tablet Take 1 tablet (75 mg total) by mouth daily with breakfast. 30 tablet 6  . insulin NPH Human (HUMULIN N,NOVOLIN N) 100 UNIT/ML injection Inject into the skin. Takes 40 units in the am and 30 units in the pm, [facility] different brand name, patient takes this brand t/c call made 05/13/2015    . insulin regular (NOVOLIN R,HUMULIN R) 100 units/mL injection Inject into the skin twice daily as needed per sliding scale.    Marland Kitchen. lisinopril-hydrochlorothiazide (PRINZIDE,ZESTORETIC) 10-12.5 MG tablet Take 1 tablet by mouth daily.    . Multiple Vitamin (  MULTIVITAMIN WITH MINERALS) TABS tablet Take 1 tablet by mouth daily.    . nitroGLYCERIN (NITROSTAT) 0.4 MG SL tablet Place 1 tablet (0.4 mg total) under the tongue every 5 (five) minutes as needed for chest pain. 25 tablet 3   No current facility-administered medications for this visit.     Allergies:   Patient has no known allergies.    Social History:  The patient  reports that  has never smoked. he has never used smokeless tobacco. He reports that he does not drink alcohol or use drugs.   Family History:  The patient's family history includes Healthy in his mother; Other in his father; Stroke in his maternal grandmother.    ROS:  General:no colds or fevers, no weight changes-  Skin:no rashes or ulcers HEENT:no blurred vision, no  congestion- loss of Lt eye at age 71 CV:see HPI PUL:see HPI GI:no diarrhea constipation or melena, no indigestion GU:no hematuria, no dysuria MS:no joint pain, no claudication Neuro:no syncope, no lightheadedness Endo:+ diabetes controlled, no thyroid disease  Wt Readings from Last 3 Encounters:  02/23/17 195 lb 12.8 oz (88.8 kg)  08/29/16 203 lb 3.2 oz (92.2 kg)  08/03/16 203 lb 3.2 oz (92.2 kg)     PHYSICAL EXAM: VS:  BP 108/60   Pulse 67   Resp 16   Ht 5\' 11"  (1.803 m)   Wt 195 lb 12.8 oz (88.8 kg)   SpO2 96%   BMI 27.31 kg/m  , BMI Body mass index is 27.31 kg/m. General:Pleasant affect, NAD Skin:Warm and dry, brisk capillary refill HEENT:normocephalic, sclera clear rt eye, mucus membranes moist no Lt eye Neck:supple, no JVD, no bruits  Heart:S1S2 RRR without murmur, gallup, rub or click Lungs:clear without rales, rhonchi, or wheezes ONG:EXBMAbd:soft, non tender, + BS, do not palpate liver spleen or masses Ext:no lower ext edema, 2+ pedal pulses, 2+ radial pulses Neuro:alert and oriented X 3, MAE, follows commands, + facial symmetry- has to hunt for words with speech     EKG:  EKG is NOT ordered today.   Recent Labs: 08/05/2016: ALT 16    Lipid Panel    Component Value Date/Time   CHOL 146 08/05/2016 1245   TRIG 156 (H) 08/05/2016 1245   HDL 37 (L) 08/05/2016 1245   CHOLHDL 3.9 08/05/2016 1245   CHOLHDL 5.3 (H) 09/21/2015 1241   VLDL 20 09/21/2015 1241   LDLCALC 78 08/05/2016 1245       Other studies Reviewed: Additional studies/ records that were reviewed today include: . Cardiac cath Procedures   Left Heart Cath and Coronary Angiography  Conclusion    Acute inferolateral myocardial infarction due to occlusion of a very large first obtuse marginal in the mid segment.  Moderate proximal to mid LAD disease less than 60% obstructive. Luminal irregularities throughout the proximal and mid right coronary.  Mid inferior wall akinesis. EF  40-50%.  Successful angioplasty followed by stenting of the totally occluded obtuse marginal bifurcation stenosis with implantation of a single drug-eluting stent across the smaller branch reducing 100% stenosis to 0% with TIMI grade 3 flow. Final post dilation balloon diameter 3.25 mm. Angioplasty was performed on the smaller obtuse marginal branch prior to stenting.  RECOMMENDATIONS:   Given history of relatively recent intracerebral hemorrhage, Plavix was administered, 600 mg.   Cangrelor IV bolus and infusion for 2 hours will be used.  Intravenous heparin was discontinued.  Frequent neurological screening for development of headache or other evidence of intracranial bleeding.  Aspirin and  Plavix 6 months   EF 45-50%  ASSESSMENT AND PLAN:  1.  Hx of inferolateral MI 2017 no recurrent angina.  Will stop plavix, both Dr. Katrinka Blazing and Dr. Pearlean Brownie have documented he could stop.  Continue ASA.  Follow up with Dr. Katrinka Blazing in 6 months.  2.  CAD with previous stent  Stable  3.   HTN, has been low and symptomatic with Dr. Nicholos Johns decreasing the lisinopril hctz.  Pt feeling better.   4.  HLD last ldl was 78 recheck in 6 months.  Continue lipitor 80 daily    5.  Cerebral hemorrhage followed by Dr. Pearlean Brownie  6.  DM-2 stable followed by PCP.   Current medicines are reviewed with the patient today.  The patient Has no concerns regarding medicines.  The following changes have been made:  See above Labs/ tests ordered today include:see above  Disposition:   FU:  see above  Signed, Nada Boozer, NP  02/23/2017 8:39 AM    [facility] [address] [place], [place], [place]  [address]/ [address] [place], [place] Phone: [phone]; Fax: [phone]  [phone]

## 2017-02-23 ENCOUNTER — Encounter: Payer: Self-pay | Admitting: Cardiology

## 2017-02-23 ENCOUNTER — Encounter (INDEPENDENT_AMBULATORY_CARE_PROVIDER_SITE_OTHER): Payer: Self-pay

## 2017-02-23 ENCOUNTER — Ambulatory Visit: Payer: PPO | Admitting: Cardiology

## 2017-02-23 VITALS — BP 108/60 | HR 67 | Resp 16 | Ht 71.0 in | Wt 195.8 lb

## 2017-02-23 DIAGNOSIS — I10 Essential (primary) hypertension: Secondary | ICD-10-CM | POA: Diagnosis not present

## 2017-02-23 DIAGNOSIS — E1142 Type 2 diabetes mellitus with diabetic polyneuropathy: Secondary | ICD-10-CM

## 2017-02-23 DIAGNOSIS — I2510 Atherosclerotic heart disease of native coronary artery without angina pectoris: Secondary | ICD-10-CM | POA: Diagnosis not present

## 2017-02-23 DIAGNOSIS — I252 Old myocardial infarction: Secondary | ICD-10-CM

## 2017-02-23 DIAGNOSIS — I619 Nontraumatic intracerebral hemorrhage, unspecified: Secondary | ICD-10-CM | POA: Diagnosis not present

## 2017-02-23 DIAGNOSIS — E782 Mixed hyperlipidemia: Secondary | ICD-10-CM | POA: Diagnosis not present

## 2017-02-23 NOTE — Patient Instructions (Signed)
Medication Instructions:  Your physician has recommended you make the following change in your medication:  1.  STOP the Plavix   Labwork: None ordered3  Testing/Procedures: None ordered  Follow-Up: Your physician wants you to follow-up in: 6 MONTHS WITH DR. Marlou StarksSMITH   You will receive a reminder letter in the mail two months in advance. If you don't receive a letter, please call our office to schedule the follow-up appointment.   Any Other Special Instructions Will Be Listed Below (If Applicable).     If you need a refill on your cardiac medications before your next appointment, please call your pharmacy.

## 2017-05-22 DIAGNOSIS — E1065 Type 1 diabetes mellitus with hyperglycemia: Secondary | ICD-10-CM | POA: Diagnosis not present

## 2017-05-22 DIAGNOSIS — Z794 Long term (current) use of insulin: Secondary | ICD-10-CM | POA: Diagnosis not present

## 2017-05-22 DIAGNOSIS — Z6827 Body mass index (BMI) 27.0-27.9, adult: Secondary | ICD-10-CM | POA: Diagnosis not present

## 2017-05-22 DIAGNOSIS — E7800 Pure hypercholesterolemia, unspecified: Secondary | ICD-10-CM | POA: Diagnosis not present

## 2017-05-22 DIAGNOSIS — I69359 Hemiplegia and hemiparesis following cerebral infarction affecting unspecified side: Secondary | ICD-10-CM | POA: Diagnosis not present

## 2017-05-22 DIAGNOSIS — I69322 Dysarthria following cerebral infarction: Secondary | ICD-10-CM | POA: Diagnosis not present

## 2017-05-22 DIAGNOSIS — I10 Essential (primary) hypertension: Secondary | ICD-10-CM | POA: Diagnosis not present

## 2017-05-22 DIAGNOSIS — G4700 Insomnia, unspecified: Secondary | ICD-10-CM | POA: Diagnosis not present

## 2017-05-22 DIAGNOSIS — F1021 Alcohol dependence, in remission: Secondary | ICD-10-CM | POA: Diagnosis not present

## 2017-05-22 DIAGNOSIS — I252 Old myocardial infarction: Secondary | ICD-10-CM | POA: Diagnosis not present

## 2017-05-22 DIAGNOSIS — I69319 Unspecified symptoms and signs involving cognitive functions following cerebral infarction: Secondary | ICD-10-CM | POA: Diagnosis not present

## 2017-07-03 ENCOUNTER — Other Ambulatory Visit: Payer: Self-pay

## 2017-07-03 ENCOUNTER — Emergency Department (HOSPITAL_COMMUNITY): Payer: PPO

## 2017-07-03 ENCOUNTER — Inpatient Hospital Stay (HOSPITAL_COMMUNITY)
Admission: EM | Admit: 2017-07-03 | Discharge: 2017-07-13 | DRG: 101 | Disposition: A | Discharge status: Home Health | Payer: PPO | Attending: Family Medicine | Admitting: Family Medicine

## 2017-07-03 ENCOUNTER — Encounter (HOSPITAL_COMMUNITY): Payer: Self-pay | Admitting: Radiology

## 2017-07-03 DIAGNOSIS — F1010 Alcohol abuse, uncomplicated: Secondary | ICD-10-CM | POA: Diagnosis present

## 2017-07-03 DIAGNOSIS — E785 Hyperlipidemia, unspecified: Secondary | ICD-10-CM | POA: Diagnosis present

## 2017-07-03 DIAGNOSIS — Z955 Presence of coronary angioplasty implant and graft: Secondary | ICD-10-CM

## 2017-07-03 DIAGNOSIS — I69220 Aphasia following other nontraumatic intracranial hemorrhage: Secondary | ICD-10-CM | POA: Diagnosis not present

## 2017-07-03 DIAGNOSIS — Z781 Physical restraint status: Secondary | ICD-10-CM

## 2017-07-03 DIAGNOSIS — F10239 Alcohol dependence with withdrawal, unspecified: Secondary | ICD-10-CM | POA: Diagnosis present

## 2017-07-03 DIAGNOSIS — E1165 Type 2 diabetes mellitus with hyperglycemia: Secondary | ICD-10-CM | POA: Diagnosis present

## 2017-07-03 DIAGNOSIS — Z23 Encounter for immunization: Secondary | ICD-10-CM

## 2017-07-03 DIAGNOSIS — D72829 Elevated white blood cell count, unspecified: Secondary | ICD-10-CM | POA: Diagnosis present

## 2017-07-03 DIAGNOSIS — R509 Fever, unspecified: Secondary | ICD-10-CM | POA: Diagnosis not present

## 2017-07-03 DIAGNOSIS — Z7982 Long term (current) use of aspirin: Secondary | ICD-10-CM | POA: Diagnosis not present

## 2017-07-03 DIAGNOSIS — D649 Anemia, unspecified: Secondary | ICD-10-CM | POA: Diagnosis present

## 2017-07-03 DIAGNOSIS — G40209 Localization-related (focal) (partial) symptomatic epilepsy and epileptic syndromes with complex partial seizures, not intractable, without status epilepticus: Principal | ICD-10-CM | POA: Diagnosis present

## 2017-07-03 DIAGNOSIS — R062 Wheezing: Secondary | ICD-10-CM | POA: Diagnosis not present

## 2017-07-03 DIAGNOSIS — Z823 Family history of stroke: Secondary | ICD-10-CM | POA: Diagnosis not present

## 2017-07-03 DIAGNOSIS — G9340 Encephalopathy, unspecified: Secondary | ICD-10-CM | POA: Diagnosis present

## 2017-07-03 DIAGNOSIS — R0602 Shortness of breath: Secondary | ICD-10-CM | POA: Diagnosis not present

## 2017-07-03 DIAGNOSIS — I2510 Atherosclerotic heart disease of native coronary artery without angina pectoris: Secondary | ICD-10-CM | POA: Diagnosis present

## 2017-07-03 DIAGNOSIS — Z970 Presence of artificial eye: Secondary | ICD-10-CM | POA: Diagnosis not present

## 2017-07-03 DIAGNOSIS — N179 Acute kidney failure, unspecified: Secondary | ICD-10-CM | POA: Diagnosis not present

## 2017-07-03 DIAGNOSIS — I6789 Other cerebrovascular disease: Secondary | ICD-10-CM | POA: Diagnosis not present

## 2017-07-03 DIAGNOSIS — I119 Hypertensive heart disease without heart failure: Secondary | ICD-10-CM | POA: Diagnosis present

## 2017-07-03 DIAGNOSIS — E876 Hypokalemia: Secondary | ICD-10-CM | POA: Diagnosis present

## 2017-07-03 DIAGNOSIS — E1142 Type 2 diabetes mellitus with diabetic polyneuropathy: Secondary | ICD-10-CM | POA: Diagnosis present

## 2017-07-03 DIAGNOSIS — R569 Unspecified convulsions: Secondary | ICD-10-CM | POA: Diagnosis present

## 2017-07-03 DIAGNOSIS — I10 Essential (primary) hypertension: Secondary | ICD-10-CM | POA: Diagnosis not present

## 2017-07-03 DIAGNOSIS — Z794 Long term (current) use of insulin: Secondary | ICD-10-CM | POA: Diagnosis not present

## 2017-07-03 DIAGNOSIS — R17 Unspecified jaundice: Secondary | ICD-10-CM | POA: Diagnosis present

## 2017-07-03 DIAGNOSIS — R29810 Facial weakness: Secondary | ICD-10-CM | POA: Diagnosis not present

## 2017-07-03 DIAGNOSIS — I69251 Hemiplegia and hemiparesis following other nontraumatic intracranial hemorrhage affecting right dominant side: Secondary | ICD-10-CM

## 2017-07-03 DIAGNOSIS — I639 Cerebral infarction, unspecified: Secondary | ICD-10-CM | POA: Diagnosis not present

## 2017-07-03 DIAGNOSIS — I252 Old myocardial infarction: Secondary | ICD-10-CM | POA: Diagnosis not present

## 2017-07-03 DIAGNOSIS — R269 Unspecified abnormalities of gait and mobility: Secondary | ICD-10-CM | POA: Diagnosis not present

## 2017-07-03 DIAGNOSIS — G40909 Epilepsy, unspecified, not intractable, without status epilepticus: Secondary | ICD-10-CM | POA: Diagnosis not present

## 2017-07-03 LAB — PROTIME-INR
INR: 1
PROTHROMBIN TIME: 13.1 s (ref 11.4–15.2)

## 2017-07-03 LAB — I-STAT TROPONIN, ED: TROPONIN I, POC: 0 ng/mL (ref 0.00–0.08)

## 2017-07-03 LAB — COMPREHENSIVE METABOLIC PANEL
ALK PHOS: 76 U/L (ref 38–126)
ALT: 17 U/L (ref 17–63)
AST: 19 U/L (ref 15–41)
Albumin: 4 g/dL (ref 3.5–5.0)
Anion gap: 13 (ref 5–15)
BUN: 15 mg/dL (ref 6–20)
CALCIUM: 9.8 mg/dL (ref 8.9–10.3)
CO2: 27 mmol/L (ref 22–32)
CREATININE: 1.08 mg/dL (ref 0.61–1.24)
Chloride: 95 mmol/L — ABNORMAL LOW (ref 101–111)
Glucose, Bld: 248 mg/dL — ABNORMAL HIGH (ref 65–99)
Potassium: 3.9 mmol/L (ref 3.5–5.1)
SODIUM: 135 mmol/L (ref 135–145)
TOTAL PROTEIN: 7 g/dL (ref 6.5–8.1)
Total Bilirubin: 1 mg/dL (ref 0.3–1.2)

## 2017-07-03 LAB — CBC
HEMATOCRIT: 40.5 % (ref 39.0–52.0)
Hemoglobin: 13.5 g/dL (ref 13.0–17.0)
MCH: 30.3 pg (ref 26.0–34.0)
MCHC: 33.3 g/dL (ref 30.0–36.0)
MCV: 91 fL (ref 78.0–100.0)
PLATELETS: 227 10*3/uL (ref 150–400)
RBC: 4.45 MIL/uL (ref 4.22–5.81)
RDW: 11.9 % (ref 11.5–15.5)
WBC: 11.1 10*3/uL — AB (ref 4.0–10.5)

## 2017-07-03 LAB — ETHANOL: Alcohol, Ethyl (B): <10 mg/dL (ref <10)

## 2017-07-03 LAB — DIFFERENTIAL
Basophils Absolute: 0 10*3/uL (ref 0.0–0.1)
Basophils Relative: 0 %
EOS PCT: 4 %
Eosinophils Absolute: 0.4 10*3/uL (ref 0.0–0.7)
LYMPHS PCT: 25 %
Lymphs Abs: 2.8 10*3/uL (ref 0.7–4.0)
MONO ABS: 0.8 10*3/uL (ref 0.1–1.0)
Monocytes Relative: 7 %
NEUTROS ABS: 7.1 10*3/uL (ref 1.7–7.7)
Neutrophils Relative %: 64 %

## 2017-07-03 LAB — I-STAT CHEM 8, ED
BUN: 19 mg/dL (ref 6–20)
CALCIUM ION: 1.18 mmol/L (ref 1.15–1.40)
Chloride: 97 mmol/L — ABNORMAL LOW (ref 101–111)
Creatinine, Ser: 1 mg/dL (ref 0.61–1.24)
GLUCOSE: 246 mg/dL — AB (ref 65–99)
HCT: 43 % (ref 39.0–52.0)
Hemoglobin: 14.6 g/dL (ref 13.0–17.0)
Potassium: 4.3 mmol/L (ref 3.5–5.1)
SODIUM: 135 mmol/L (ref 135–145)
TCO2: 29 mmol/L (ref 22–32)

## 2017-07-03 LAB — APTT: aPTT: 26 seconds (ref 24–36)

## 2017-07-03 LAB — CBG MONITORING, ED: Glucose-Capillary: 248 mg/dL — ABNORMAL HIGH (ref 65–99)

## 2017-07-03 MED ORDER — LABETALOL HCL 5 MG/ML IV SOLN
10.0000 mg | Freq: Once | INTRAVENOUS | Status: AC
  Administered 2017-07-03: 10 mg via INTRAVENOUS

## 2017-07-03 MED ORDER — PNEUMOCOCCAL VAC POLYVALENT 25 MCG/0.5ML IJ INJ
0.5000 mL | INJECTION | INTRAMUSCULAR | Status: AC
  Administered 2017-07-04: 0.5 mL via INTRAMUSCULAR
  Filled 2017-07-03: qty 0.5

## 2017-07-03 MED ORDER — SODIUM CHLORIDE 0.9 % IV SOLN
2000.0000 mg | Freq: Once | INTRAVENOUS | Status: AC
  Administered 2017-07-03: 2000 mg via INTRAVENOUS
  Filled 2017-07-03: qty 20

## 2017-07-03 MED ORDER — LORAZEPAM 2 MG/ML IJ SOLN
INTRAMUSCULAR | Status: AC
  Administered 2017-07-03: 2 mg via INTRAVENOUS
  Filled 2017-07-03: qty 2

## 2017-07-03 MED ORDER — LORAZEPAM 2 MG/ML IJ SOLN
INTRAMUSCULAR | Status: AC
  Administered 2017-07-03: 2 mg
  Filled 2017-07-03: qty 1

## 2017-07-03 MED ORDER — IOPAMIDOL (ISOVUE-370) INJECTION 76%
50.0000 mL | Freq: Once | INTRAVENOUS | Status: AC | PRN
Start: 2017-07-03 — End: 2017-07-03
  Administered 2017-07-03: 50 mL via INTRAVENOUS

## 2017-07-03 MED ORDER — LORAZEPAM 2 MG/ML IJ SOLN
2.0000 mg | Freq: Once | INTRAMUSCULAR | Status: AC
  Administered 2017-07-03: 2 mg via INTRAVENOUS

## 2017-07-03 MED ORDER — IOPAMIDOL (ISOVUE-370) INJECTION 76%
50.0000 mL | Freq: Once | INTRAVENOUS | Status: AC | PRN
  Administered 2017-07-03: 50 mL via INTRAVENOUS

## 2017-07-03 NOTE — ED Provider Notes (Signed)
[place] EMERGENCY DEPARTMENT Provider Note   CSN: [id number] Arrival date & time: 07/03/17  1958     History   Chief Complaint Chief Complaint  Patient presents with  . Code Stroke    HPI Richard [name] is a 72 y.o. male.  Pt presents to the ED today with right sided facial droop, paralysis, and aphasia.  He presented as a code stroke and stroke team met him upon arrival.  The pt taken to CT and began to seize on the right side.  Pt given 2 mg ativan IV and 10 mg labetalol iv.  He was still seizing and was given an additional 2 mg ativan iv and 2 g keppra iv.  No hx of seizures.  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      Past Medical History:  Diagnosis Date  . CAD (coronary artery disease)    a. 05/2015 Inflat STEMI/PCI: LM nl, LAD 50p, Om1 100 (3.0 x 73m Promus Premier DES), OM2/3 small, RCA 40p/d, EF 45-50%.  . Diabetes mellitus without complication ([place]   . Diastolic dysfunction    a. 11/2014 Echo: EF 60-65%, gr1 DD, mildly dil RA.  . ETOH abuse   . H/O ventricular fibrillation    a. 05/2015 in setting of inferolateral STEMI.  . Hypertensive heart disease   . [place]acranial hemorrhage ([place]    a. 11/2014 in setting of marked HTN->3.5x2.5 left frontal ICH (12-156mby MRI); b. 11/2014 MRA no evidence of AVM or CAA; c. 11/2014 Carotid U/S: unremarkable.  . Left eye injury   . ST elevation MI (STEMI) (HCC[place]3/17/17  . [facility] ([facility]    Patient Active Problem List   Diagnosis Date Noted  . Seizures (HCEure[place]04/10/2017  . CAD (coronary artery disease) 06/22/2015  . Hypertensive heart disease 06/14/2015  . Hyperlipidemia 06/14/2015  . 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 Aphasia complicating stroke 1155/37/4827. 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 Type 2 diabetes mellitus with peripheral neuropathy (HCC)   . Essential hypertension   . Alcohol withdrawal (HCPort [place]  . Cerebral  hemorrhage (HCDec[place]  . Endotracheally intubated     Past Surgical History:  Procedure Laterality Date  . CORONARY ANGIOPLASTY    . LEFT HEART CATH AND CORONARY ANGIOGRAPHY N/A 06/12/2015   Procedure: Left Heart Cath and Coronary Angiography;  Surgeon: SmBelva CromeMD;  Location: [facility];  Service: Cardiovascular;  Laterality: N/A;        Home Medications    Prior to Admission medications   Medication Sig Start Date End Date Taking? Authorizing Provider  amLODipine (NORVASC) 10 MG tablet Take 1 tablet (10 mg total) by mouth daily. 12/26/14   Angiulli, DaLavon PaganiniPA-C  aspirin EC 81 MG tablet Take 81 mg by mouth daily.    [provider]  atorvastatin (LIPITOR) 40 MG tablet Take 80 mg by mouth daily.    [provider]  carvedilol (COREG) 6.25 MG tablet Take 1 tablet (6.25 mg total) by mouth 2 (two) times daily with a meal. 06/14/15   BeTheora GianottiNP  insulin NPH Human (HUMULIN N,NOVOLIN N) 100 UNIT/ML injection Inject into the skin. Takes 40 units in the am and 30 units in the pm, [facility] a different brand name, patient takes this brand t/c call made 05/13/2015    [provider]  insulin regular (NOVOLIN R,HUMULIN R)  100 units/mL injection Inject into the skin twice daily as needed per sliding scale.    [provider]  lisinopril-hydrochlorothiazide (PRINZIDE,ZESTORETIC) 10-12.5 MG tablet Take 1 tablet by mouth daily.    [provider]  Multiple Vitamin (MULTIVITAMIN WITH MINERALS) TABS tablet Take 1 tablet by mouth daily. 12/26/14   Angiulli, Lavon Paganini, PA-C  nitroGLYCERIN (NITROSTAT) 0.4 MG SL tablet Place 1 tablet (0.4 mg total) under the tongue every 5 (five) minutes as needed for chest pain. 06/22/15   Imogene Burn, PA-C    Family History Family History  Problem Relation Age of Onset  . Stroke Maternal Grandmother   . Healthy Mother   . Other Father        hx unknown    Social  History Social History   Tobacco Use  . Smoking status: Never Smoker  . Smokeless tobacco: Never Used  Substance Use Topics  . Alcohol use: No    Alcohol/week: 0.0 oz    Comment: prev heavy  . Drug use: No     Allergies   Patient has no known allergies.   Review of Systems Review of Systems  Unable to perform ROS: Mental status change     Physical Exam Updated Vital Signs BP 112/78   Pulse 77   Temp 98.5 F (36.9 C) (Oral)   Resp 14   Ht '6\' 1"'$  (1.854 m)   Wt 91.5 kg (201 lb 11.5 oz)   SpO2 97%   BMI 26.61 kg/m   Physical Exam  Constitutional: He appears well-developed and well-nourished.  HENT:  Head: Normocephalic and atraumatic.  Right Ear: External ear normal.  Left Ear: External ear normal.  Nose: Nose normal.  Mouth/Throat: Oropharynx is clear and moist.  Right eye nystagmus Left eye prosthetic  Neck: Normal range of motion. Neck supple.  Cardiovascular: Normal rate, regular rhythm, normal heart sounds and intact distal pulses.  Pulmonary/Chest: Effort normal and breath sounds normal.  Abdominal: Soft. Bowel sounds are normal.  Musculoskeletal: Normal range of motion.  Neurological:  Muscle twitching right arm and leg  Skin: Skin is warm. Capillary refill takes less than 2 seconds.  Nursing note and vitals reviewed.    ED Treatments / Results  Labs (all labs ordered are listed, but only abnormal results are displayed) Labs Reviewed  CBC - Abnormal; Notable for the following components:      Result Value   WBC 11.1 (*)    All other components within normal limits  COMPREHENSIVE METABOLIC PANEL - Abnormal; Notable for the following components:   Chloride 95 (*)    Glucose, Bld 248 (*)    All other components within normal limits  I-STAT CHEM 8, ED - Abnormal; Notable for the following components:   Chloride 97 (*)    Glucose, Bld 246 (*)    All other components within normal limits  CBG MONITORING, ED - Abnormal; Notable for the following  components:   Glucose-Capillary 248 (*)    All other components within normal limits  ETHANOL  PROTIME-INR  APTT  DIFFERENTIAL  RAPID URINE DRUG SCREEN, HOSP PERFORMED  URINALYSIS, ROUTINE W REFLEX MICROSCOPIC  I-STAT TROPONIN, ED    EKG EKG Interpretation  Date/Time:  Monday July 03 2017 20:43:17 EDT Ventricular Rate:  80 PR Interval:    QRS Duration: 91 QT Interval:  364 QTC Calculation: 420 R Axis:   -35 Text Interpretation:  Sinus rhythm Probable inferior infarct, age indeterminate Lateral leads are also involved Baseline wander in  lead(s) V1 V3 V4 V5 V6 No significant change since last tracing Confirmed by Isla Pence [phone]) on 07/03/2017 8:51:54 PM   Radiology Ct Angio Head W Or Wo Contrast  Result Date: 07/03/2017 CLINICAL DATA:  Initial evaluation for acute stroke. EXAM: CT ANGIOGRAPHY HEAD AND NECK TECHNIQUE: Multidetector CT imaging of the head and neck was performed using the standard protocol during bolus administration of intravenous contrast. Multiplanar CT image reconstructions and MIPs were obtained to evaluate the vascular anatomy. Carotid stenosis measurements (when applicable) are obtained utilizing NASCET criteria, using the distal internal carotid diameter as the denominator. Multiphase CT imaging of the brain was performed following IV bolus contrast injection. Subsequent parametric perfusion maps were calculated using RAPID software. CONTRAST:  69m ISOVUE-370 IOPAMIDOL (ISOVUE-370) INJECTION 76% COMPARISON:  None. FINDINGS: CT HEAD FINDINGS Brain: Generalized age-related cerebral atrophy. Patchy and confluent hypodensity within the periventricular and deep white matter both cerebral hemispheres, most consistent with chronic small vessel ischemic disease, fairly advanced in nature. Encephalomalacia within the posterior left frontal region, consistent with remote infarct/hemorrhage as seen on prior CT. Additional area of encephalomalacia within the right frontal  lobe also consistent with remote infarct, stable from previous. Small remote left occipital infarct noted. Few small remote bilateral basal ganglia lacunar infarcts. No acute intracranial hemorrhage. No evidence for acute large vessel territory infarct. No mass lesion, midline shift or mass effect. No hydrocephalus. No extra-axial fluid collection. Vascular: No asymmetric hyperdense vessel. Calcified atherosclerosis present at the skull base. Skull: Scalp soft tissues within normal limits.  Calvarium intact. Sinuses/Orbits: Left ocular prosthesis noted. Globes and orbital soft tissues demonstrate no acute abnormality. Mild scattered mucosal thickening within the left sphenoid sinus. Paranasal sinuses are otherwise largely clear. No mastoid effusion. Other: None. ASPECTS ([place]Stroke Program Early CT Score) - Ganglionic level infarction (caudate, lentiform nuclei, internal capsule, insula, M1-M3 cortex): 7 - Supraganglionic infarction (M4-M6 cortex): 3 Total score (0-10 with 10 being normal): 10 Review of the MIP images confirms the above findings Prior to the study being completed, the examination was discontinued by the ordering provider. Scalp AP and lateral tomogram images only are provided. IMPRESSION: 1. No acute intracranial abnormality. 2. ASPECTS = 10. 3. Moderate cerebral atrophy with chronic small vessel ischemic disease with multifocal remote ischemic infarcts as above. 4. The CTA portion of this exam was discontinued by the ordering provider prior to the study being performed. Only topogram images are provided. No charges should be applied to the patient. Electronically Signed   By: BJeannine BogaM.D.   On: 07/03/2017 20:44   Ct Angio Head W Or Wo Contrast  Result Date: 07/03/2017 CLINICAL DATA:  Initial evaluation for acute stroke. EXAM: CT ANGIOGRAPHY HEAD AND NECK TECHNIQUE: Multidetector CT imaging of the head and neck was performed using the standard protocol during bolus administration  of intravenous contrast. Multiplanar CT image reconstructions and MIPs were obtained to evaluate the vascular anatomy. Carotid stenosis measurements (when applicable) are obtained utilizing NASCET criteria, using the distal internal carotid diameter as the denominator. Multiphase CT imaging of the brain was performed following IV bolus contrast injection. Subsequent parametric perfusion maps were calculated using RAPID software. CONTRAST:  546mISOVUE-370 IOPAMIDOL (ISOVUE-370) INJECTION 76% COMPARISON:  None. FINDINGS: CT HEAD FINDINGS Brain: Generalized age-related cerebral atrophy. Patchy and confluent hypodensity within the periventricular and deep white matter both cerebral hemispheres, most consistent with chronic small vessel ischemic disease, fairly advanced in nature. Encephalomalacia within the posterior left frontal region, consistent with remote infarct/hemorrhage as  seen on prior CT. Additional area of encephalomalacia within the right frontal lobe also consistent with remote infarct, stable from previous. Small remote left occipital infarct noted. Few small remote bilateral basal ganglia lacunar infarcts. No acute intracranial hemorrhage. No evidence for acute large vessel territory infarct. No mass lesion, midline shift or mass effect. No hydrocephalus. No extra-axial fluid collection. Vascular: No asymmetric hyperdense vessel. Calcified atherosclerosis present at the skull base. Skull: Scalp soft tissues within normal limits.  Calvarium intact. Sinuses/Orbits: Left ocular prosthesis noted. Globes and orbital soft tissues demonstrate no acute abnormality. Mild scattered mucosal thickening within the left sphenoid sinus. Paranasal sinuses are otherwise largely clear. No mastoid effusion. Other: None. ASPECTS ([place] Stroke Program Early CT Score) - Ganglionic level infarction (caudate, lentiform nuclei, internal capsule, insula, M1-M3 cortex): 7 - Supraganglionic infarction (M4-M6 cortex): 3 Total  score (0-10 with 10 being normal): 10 Review of the MIP images confirms the above findings Prior to the study being completed, the examination was discontinued by the ordering provider. Scalp AP and lateral tomogram images only are provided. IMPRESSION: 1. No acute intracranial abnormality. 2. ASPECTS = 10. 3. Moderate cerebral atrophy with chronic small vessel ischemic disease with multifocal remote ischemic infarcts as above. 4. The CTA portion of this exam was discontinued by the ordering provider prior to the study being performed. Only topogram images are provided. No charges should be applied to the patient. Electronically Signed   By: Jeannine Boga M.D.   On: 07/03/2017 20:44   Ct Angio Neck W Or Wo Contrast  Result Date: 07/03/2017 CLINICAL DATA:  Initial evaluation for acute stroke. EXAM: CT ANGIOGRAPHY HEAD AND NECK TECHNIQUE: Multidetector CT imaging of the head and neck was performed using the standard protocol during bolus administration of intravenous contrast. Multiplanar CT image reconstructions and MIPs were obtained to evaluate the vascular anatomy. Carotid stenosis measurements (when applicable) are obtained utilizing NASCET criteria, using the distal internal carotid diameter as the denominator. Multiphase CT imaging of the brain was performed following IV bolus contrast injection. Subsequent parametric perfusion maps were calculated using RAPID software. CONTRAST:  36m ISOVUE-370 IOPAMIDOL (ISOVUE-370) INJECTION 76% COMPARISON:  None. FINDINGS: CT HEAD FINDINGS Brain: Generalized age-related cerebral atrophy. Patchy and confluent hypodensity within the periventricular and deep white matter both cerebral hemispheres, most consistent with chronic small vessel ischemic disease, fairly advanced in nature. Encephalomalacia within the posterior left frontal region, consistent with remote infarct/hemorrhage as seen on prior CT. Additional area of encephalomalacia within the right frontal  lobe also consistent with remote infarct, stable from previous. Small remote left occipital infarct noted. Few small remote bilateral basal ganglia lacunar infarcts. No acute intracranial hemorrhage. No evidence for acute large vessel territory infarct. No mass lesion, midline shift or mass effect. No hydrocephalus. No extra-axial fluid collection. Vascular: No asymmetric hyperdense vessel. Calcified atherosclerosis present at the skull base. Skull: Scalp soft tissues within normal limits.  Calvarium intact. Sinuses/Orbits: Left ocular prosthesis noted. Globes and orbital soft tissues demonstrate no acute abnormality. Mild scattered mucosal thickening within the left sphenoid sinus. Paranasal sinuses are otherwise largely clear. No mastoid effusion. Other: None. ASPECTS (ABeachwoodStroke Program Early CT Score) - Ganglionic level infarction (caudate, lentiform nuclei, internal capsule, insula, M1-M3 cortex): 7 - Supraganglionic infarction (M4-M6 cortex): 3 Total score (0-10 with 10 being normal): 10 Review of the MIP images confirms the above findings Prior to the study being completed, the examination was discontinued by the ordering provider. Scalp AP and lateral tomogram images only are  provided. IMPRESSION: 1. No acute intracranial abnormality. 2. ASPECTS = 10. 3. Moderate cerebral atrophy with chronic small vessel ischemic disease with multifocal remote ischemic infarcts as above. 4. The CTA portion of this exam was discontinued by the ordering provider prior to the study being performed. Only topogram images are provided. No charges should be applied to the patient. Electronically Signed   By: Jeannine Boga M.D.   On: 07/03/2017 20:44   Ct Head Code Stroke Wo Contrast  Result Date: 07/03/2017 CLINICAL DATA:  Initial evaluation for acute stroke. EXAM: CT ANGIOGRAPHY HEAD AND NECK TECHNIQUE: Multidetector CT imaging of the head and neck was performed using the standard protocol during bolus  administration of intravenous contrast. Multiplanar CT image reconstructions and MIPs were obtained to evaluate the vascular anatomy. Carotid stenosis measurements (when applicable) are obtained utilizing NASCET criteria, using the distal internal carotid diameter as the denominator. Multiphase CT imaging of the brain was performed following IV bolus contrast injection. Subsequent parametric perfusion maps were calculated using RAPID software. CONTRAST:  29m ISOVUE-370 IOPAMIDOL (ISOVUE-370) INJECTION 76% COMPARISON:  None. FINDINGS: CT HEAD FINDINGS Brain: Generalized age-related cerebral atrophy. Patchy and confluent hypodensity within the periventricular and deep white matter both cerebral hemispheres, most consistent with chronic small vessel ischemic disease, fairly advanced in nature. Encephalomalacia within the posterior left frontal region, consistent with remote infarct/hemorrhage as seen on prior CT. Additional area of encephalomalacia within the right frontal lobe also consistent with remote infarct, stable from previous. Small remote left occipital infarct noted. Few small remote bilateral basal ganglia lacunar infarcts. No acute intracranial hemorrhage. No evidence for acute large vessel territory infarct. No mass lesion, midline shift or mass effect. No hydrocephalus. No extra-axial fluid collection. Vascular: No asymmetric hyperdense vessel. Calcified atherosclerosis present at the skull base. Skull: Scalp soft tissues within normal limits.  Calvarium intact. Sinuses/Orbits: Left ocular prosthesis noted. Globes and orbital soft tissues demonstrate no acute abnormality. Mild scattered mucosal thickening within the left sphenoid sinus. Paranasal sinuses are otherwise largely clear. No mastoid effusion. Other: None. ASPECTS (A[place]Stroke Program Early CT Score) - Ganglionic level infarction (caudate, lentiform nuclei, internal capsule, insula, M1-M3 cortex): 7 - Supraganglionic infarction (M4-M6  cortex): 3 Total score (0-10 with 10 being normal): 10 Review of the MIP images confirms the above findings Prior to the study being completed, the examination was discontinued by the ordering provider. Scalp AP and lateral tomogram images only are provided. IMPRESSION: 1. No acute intracranial abnormality. 2. ASPECTS = 10. 3. Moderate cerebral atrophy with chronic small vessel ischemic disease with multifocal remote ischemic infarcts as above. 4. The CTA portion of this exam was discontinued by the ordering provider prior to the study being performed. Only topogram images are provided. No charges should be applied to the patient. Electronically Signed   By: BJeannine BogaM.D.   On: 07/03/2017 20:44    Procedures Procedures (including critical care time)  Medications Ordered in ED Medications  iopamidol (ISOVUE-370) 76 % injection 50 mL (50 mLs Intravenous Contrast Given 07/03/17 2014)  levETIRAcetam (KEPPRA) 2,000 mg in sodium chloride 0.9 % 100 mL IVPB (2,000 mg Intravenous New Bag/Given 07/03/17 2039)  LORazepam (ATIVAN) 2 MG/ML injection (2 mg  Given 07/03/17 2027)  labetalol (NORMODYNE,TRANDATE) injection 10 mg (10 mg Intravenous Given 07/03/17 2018)  LORazepam (ATIVAN) injection 2 mg (2 mg Intravenous Given 07/03/17 2018)  iopamidol (ISOVUE-370) 76 % injection 50 mL (50 mLs Intravenous Contrast Given 07/03/17 2109)     Initial Impression / Assessment  and Plan / ED Course  I have reviewed the triage vital signs and the nursing notes.  Pertinent labs & imaging results that were available during my care of the patient were reviewed by me and considered in my medical decision making (see chart for details).    After 2nd ativan dose, pt stopped seizing.  Dr. Lorraine Lax (neurology) cancelled code stroke.  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  He requests keppra 750 bid and hospitalist admission.  Pt d/w Dr. Hal Hope (triad) for admission.  Final Clinical  Impressions(s) / ED Diagnoses   Final diagnoses:  Partial seizure [facility])    ED Discharge Orders    None       Isla Pence, MD 07/03/17 2156

## 2017-07-03 NOTE — ED Notes (Signed)
Stroke cancelled @ 2025

## 2017-07-03 NOTE — ED Notes (Signed)
CBG 248  

## 2017-07-03 NOTE — Code Documentation (Addendum)
Code Stroke:  Patient arrived with [facility] from home, per EMS, patient came from home, acute onset of right sided facial droop, right sided paralysis, and aphasia. Per EMS, patient was not able to follow commands.  Patient taken to CT for Head CT and CTA and during the CTA, patient began to seize. Right sided partial seizure was witnessed. Patient was taken out of the scanner, given 2mg  Ativan IV and Labetalol 10mg  IV. Patient was taken to his room, given an additional dose of Ativan 2mg  IV and 2000mg  Keppra IV.

## 2017-07-03 NOTE — ED Notes (Signed)
Pt 92% on RA. Pt placed on 2L [place] and now 94%. Will continue to monitor.

## 2017-07-03 NOTE — ED Notes (Signed)
ED Provider at bedside. 

## 2017-07-03 NOTE — ED Triage Notes (Signed)
Pt arrives via [facility] from home due to sudden onset right sided facial droop and inability to follow commands at 1900. Pt van positive. Pt has hx of hemorrhagic stroke. Pt 91.5 kg. Family denies hx of seizures. Family reports prior to episode pt is sometimes confused but can follow commands.

## 2017-07-03 NOTE — ED Notes (Signed)
Code stroke cancelled 

## 2017-07-03 NOTE — Consult Note (Signed)
Neurology Consultation Reason for Consult: Right side weakness, aphasia Seizure Referring Physician: Dr Particia Nearing   History is obtained from: EMS, chart review  HPI: Richard [name] is a 72 y.o. 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 Patient noted to be weaker than usual of right side, right facial droop, aphasic on assessment. No gaze devaiton towards left side noted.  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   Stroke alert was cancelled. Patient required 2mg  x2 to stop seizure. Was loaded with 2g Keppra.     ROS: Unable to obtain due to altered mental status.   Past Medical History:  Diagnosis Date  . CAD (coronary artery disease)    a. 05/2015 Inflat STEMI/PCI: LM nl, LAD 50p, Om1 100 (3.0 x 16mm Promus Premier DES), OM2/3 small, RCA 40p/d, EF 45-50%.  . Diabetes mellitus without complication (HCC)   . Diastolic dysfunction    a. 11/2014 Echo: EF 60-65%, gr1 DD, mildly dil RA.  . ETOH abuse   . H/O ventricular fibrillation    a. 05/2015 in setting of inferolateral STEMI.  . Hypertensive heart disease   . Intracranial hemorrhage (HCC)    a. 11/2014 in setting of marked HTN->3.5x2.5 left frontal ICH (12-49ml by MRI); b. 11/2014 MRA no evidence of AVM or CAA; c. 11/2014 Carotid U/S: unremarkable.  . Left eye injury   . ST elevation MI (STEMI) (HCC) 06/12/15  . Stroke [facility])      Family History  Problem Relation Age of Onset  . Stroke Maternal Grandmother   . Healthy Mother   . Other Father        hx unknown     Social History:  reports that he has never smoked. He has never  used smokeless tobacco. He reports that he does not drink alcohol or use drugs.  Exam: Current vital signs: BP 112/78   Pulse 77   Temp 98.5 F (36.9 C) (Oral)   Resp 14   Ht 6\' 1"  (1.854 m)   Wt 91.5 kg (201 lb 11.5 oz)   SpO2 97%   BMI 26.61 kg/m  Vital signs in last 24 hours: Temp:  [98.5 F (36.9 C)] 98.5 F (36.9 C) (04/08 2018) Pulse Rate:  [77-86] 77 (04/08 2130) Resp:  [14-15] 14 (04/08 2130) BP: (112-207)/(77-118) 112/78 (04/08 2130) SpO2:  [94 %-97 %] 97 % (04/08 2130) Weight:  [91.5 kg (201 lb 11.5 oz)] 91.5 kg (201 lb 11.5 oz) (04/08 1957)   Physical Exam  Constitutional: Appears well-developed and well-nourished.  Psych: Affect appropriate to situation Eyes: No scleral injection HENT: No OP obstrucion Head: Normocephalic.  Cardiovascular: Normal rate and regular rhythm.  Respiratory: Effort normal, non-labored breathing GI: Soft.  No distension. There is no tenderness.  Skin: WDI  Neuro: Mental Status: Patient is awake but not following commands, answering questions. Aphasic Cranial Nerves: II: Visual Fields : left eye prosthesis, right eye  III,IV, VI: EOMI without ptosis or diploplia.  V: Facial sensation is symmetric to temperature VII: Facial movement is symmetric.  VIII: hearing is intact to voice X: Uvula elevates symmetrically XI: Shoulder shrug is  symmetric. XII: tongue is midline without atrophy or fasciculations.  Motor: Tone is increased in R UE and LE,  Sensory: Unable to assess, withdraws to pain  Deep Tendon Reflexes: Brisk reflexes over right patella, biceps Plantars: Toes are downgoing bilaterally.  Cerebellar: Unable to assess    I have reviewed labs in epic and the results pertinent to this consultation are: UDS, UA pending   I have reviewed the images obtained: Ct head, reviewed. 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

## 2017-07-03 NOTE — ED Notes (Signed)
Pt son at bedside. Elon SpannerAroora, MD explained pt status and plan of care to family.

## 2017-07-04 ENCOUNTER — Encounter (HOSPITAL_COMMUNITY): Payer: Self-pay | Admitting: Internal Medicine

## 2017-07-04 ENCOUNTER — Observation Stay (HOSPITAL_COMMUNITY): Payer: PPO

## 2017-07-04 DIAGNOSIS — F1010 Alcohol abuse, uncomplicated: Secondary | ICD-10-CM | POA: Diagnosis not present

## 2017-07-04 DIAGNOSIS — E1142 Type 2 diabetes mellitus with diabetic polyneuropathy: Secondary | ICD-10-CM | POA: Diagnosis present

## 2017-07-04 DIAGNOSIS — R569 Unspecified convulsions: Secondary | ICD-10-CM

## 2017-07-04 DIAGNOSIS — I119 Hypertensive heart disease without heart failure: Secondary | ICD-10-CM | POA: Diagnosis present

## 2017-07-04 DIAGNOSIS — I252 Old myocardial infarction: Secondary | ICD-10-CM | POA: Diagnosis not present

## 2017-07-04 DIAGNOSIS — I10 Essential (primary) hypertension: Secondary | ICD-10-CM | POA: Diagnosis not present

## 2017-07-04 DIAGNOSIS — E1165 Type 2 diabetes mellitus with hyperglycemia: Secondary | ICD-10-CM | POA: Diagnosis present

## 2017-07-04 DIAGNOSIS — Z970 Presence of artificial eye: Secondary | ICD-10-CM | POA: Diagnosis not present

## 2017-07-04 DIAGNOSIS — Z823 Family history of stroke: Secondary | ICD-10-CM | POA: Diagnosis not present

## 2017-07-04 DIAGNOSIS — Z781 Physical restraint status: Secondary | ICD-10-CM | POA: Diagnosis not present

## 2017-07-04 DIAGNOSIS — N179 Acute kidney failure, unspecified: Secondary | ICD-10-CM | POA: Diagnosis not present

## 2017-07-04 DIAGNOSIS — R17 Unspecified jaundice: Secondary | ICD-10-CM | POA: Diagnosis present

## 2017-07-04 DIAGNOSIS — I2510 Atherosclerotic heart disease of native coronary artery without angina pectoris: Secondary | ICD-10-CM | POA: Diagnosis present

## 2017-07-04 DIAGNOSIS — G9340 Encephalopathy, unspecified: Secondary | ICD-10-CM | POA: Diagnosis present

## 2017-07-04 DIAGNOSIS — Z7982 Long term (current) use of aspirin: Secondary | ICD-10-CM | POA: Diagnosis not present

## 2017-07-04 DIAGNOSIS — Z794 Long term (current) use of insulin: Secondary | ICD-10-CM | POA: Diagnosis not present

## 2017-07-04 DIAGNOSIS — F10239 Alcohol dependence with withdrawal, unspecified: Secondary | ICD-10-CM | POA: Diagnosis present

## 2017-07-04 DIAGNOSIS — I69220 Aphasia following other nontraumatic intracranial hemorrhage: Secondary | ICD-10-CM | POA: Diagnosis not present

## 2017-07-04 DIAGNOSIS — G40209 Localization-related (focal) (partial) symptomatic epilepsy and epileptic syndromes with complex partial seizures, not intractable, without status epilepticus: Secondary | ICD-10-CM | POA: Diagnosis present

## 2017-07-04 DIAGNOSIS — Z955 Presence of coronary angioplasty implant and graft: Secondary | ICD-10-CM | POA: Diagnosis not present

## 2017-07-04 DIAGNOSIS — E876 Hypokalemia: Secondary | ICD-10-CM | POA: Diagnosis present

## 2017-07-04 DIAGNOSIS — D72829 Elevated white blood cell count, unspecified: Secondary | ICD-10-CM | POA: Diagnosis present

## 2017-07-04 DIAGNOSIS — R062 Wheezing: Secondary | ICD-10-CM | POA: Diagnosis not present

## 2017-07-04 DIAGNOSIS — D649 Anemia, unspecified: Secondary | ICD-10-CM | POA: Diagnosis present

## 2017-07-04 DIAGNOSIS — I69251 Hemiplegia and hemiparesis following other nontraumatic intracranial hemorrhage affecting right dominant side: Secondary | ICD-10-CM | POA: Diagnosis not present

## 2017-07-04 DIAGNOSIS — E785 Hyperlipidemia, unspecified: Secondary | ICD-10-CM | POA: Diagnosis present

## 2017-07-04 DIAGNOSIS — Z23 Encounter for immunization: Secondary | ICD-10-CM | POA: Diagnosis present

## 2017-07-04 LAB — GLUCOSE, CAPILLARY
GLUCOSE-CAPILLARY: 152 mg/dL — AB (ref 65–99)
GLUCOSE-CAPILLARY: 96 mg/dL (ref 65–99)
GLUCOSE-CAPILLARY: 111 mg/dL — AB (ref 65–99)
Glucose-Capillary: 230 mg/dL — ABNORMAL HIGH (ref 65–99)
Glucose-Capillary: 129 mg/dL — ABNORMAL HIGH (ref 65–99)
Glucose-Capillary: 175 mg/dL — ABNORMAL HIGH (ref 65–99)

## 2017-07-04 LAB — TROPONIN I: Troponin I: <0.03 ng/mL (ref <0.03)

## 2017-07-04 LAB — COMPREHENSIVE METABOLIC PANEL
ALBUMIN: 3.4 g/dL — AB (ref 3.5–5.0)
ALT: 14 U/L — ABNORMAL LOW (ref 17–63)
ANION GAP: 8 (ref 5–15)
AST: 22 U/L (ref 15–41)
Alkaline Phosphatase: 63 U/L (ref 38–126)
BUN: 13 mg/dL (ref 6–20)
CO2: 27 mmol/L (ref 22–32)
Calcium: 9.1 mg/dL (ref 8.9–10.3)
Chloride: 100 mmol/L — ABNORMAL LOW (ref 101–111)
Creatinine, Ser: 0.93 mg/dL (ref 0.61–1.24)
GFR calc Af Amer: >60 mL/min (ref >60)
GFR calc non Af Amer: >60 mL/min (ref >60)
Glucose, Bld: 154 mg/dL — ABNORMAL HIGH (ref 65–99)
POTASSIUM: 3.2 mmol/L — AB (ref 3.5–5.1)
SODIUM: 135 mmol/L (ref 135–145)
Total Bilirubin: 0.8 mg/dL (ref 0.3–1.2)
Total Protein: 6.3 g/dL — ABNORMAL LOW (ref 6.5–8.1)

## 2017-07-04 LAB — MRSA PCR SCREENING: MRSA BY PCR: NEGATIVE

## 2017-07-04 LAB — CBC
HEMATOCRIT: 36.3 % — AB (ref 39.0–52.0)
HEMOGLOBIN: 12.1 g/dL — AB (ref 13.0–17.0)
MCH: 30 pg (ref 26.0–34.0)
MCHC: 33.3 g/dL (ref 30.0–36.0)
MCV: 90.1 fL (ref 78.0–100.0)
Platelets: 209 10*3/uL (ref 150–400)
RBC: 4.03 MIL/uL — ABNORMAL LOW (ref 4.22–5.81)
RDW: 12 % (ref 11.5–15.5)
WBC: 9.8 10*3/uL (ref 4.0–10.5)

## 2017-07-04 LAB — CREATININE, SERUM: Creatinine, Ser: 0.92 mg/dL (ref 0.61–1.24)

## 2017-07-04 LAB — MAGNESIUM: Magnesium: 1.8 mg/dL (ref 1.7–2.4)

## 2017-07-04 MED ORDER — SODIUM CHLORIDE 0.9 % IV SOLN
750.0000 mg | Freq: Two times a day (BID) | INTRAVENOUS | Status: DC
  Administered 2017-07-04 – 2017-07-08 (×9): 750 mg via INTRAVENOUS
  Filled 2017-07-04 (×10): qty 7.5

## 2017-07-04 MED ORDER — ACETAMINOPHEN 325 MG PO TABS
650.0000 mg | ORAL_TABLET | ORAL | Status: DC | PRN
  Administered 2017-07-09 – 2017-07-10 (×2): 650 mg via ORAL
  Filled 2017-07-04 (×2): qty 2

## 2017-07-04 MED ORDER — INSULIN ASPART 100 UNIT/ML ~~LOC~~ SOLN
0.0000 [IU] | SUBCUTANEOUS | Status: DC
  Administered 2017-07-04: 3 [IU] via SUBCUTANEOUS
  Administered 2017-07-04 (×2): 2 [IU] via SUBCUTANEOUS
  Administered 2017-07-05: 3 [IU] via SUBCUTANEOUS

## 2017-07-04 MED ORDER — STROKE: EARLY STAGES OF RECOVERY BOOK
Freq: Once | Status: AC
  Administered 2017-07-04: 01:00:00
  Filled 2017-07-04: qty 1

## 2017-07-04 MED ORDER — HYDRALAZINE HCL 20 MG/ML IJ SOLN: 10.0000 mg | INTRAMUSCULAR | Status: DC | PRN

## 2017-07-04 MED ORDER — ACETAMINOPHEN 650 MG RE SUPP: 650.0000 mg | RECTAL | Status: DC | PRN

## 2017-07-04 MED ORDER — LORAZEPAM 2 MG/ML IJ SOLN
0.5000 mg | Freq: Once | INTRAMUSCULAR | Status: AC
  Administered 2017-07-04: 0.5 mg via INTRAVENOUS
  Filled 2017-07-04: qty 1

## 2017-07-04 MED ORDER — HALOPERIDOL LACTATE 5 MG/ML IJ SOLN
5.0000 mg | Freq: Once | INTRAMUSCULAR | Status: AC
  Administered 2017-07-04: 5 mg via INTRAVENOUS
  Filled 2017-07-04: qty 1

## 2017-07-04 MED ORDER — LORAZEPAM 2 MG/ML IJ SOLN
2.0000 mg | INTRAMUSCULAR | Status: DC | PRN
  Administered 2017-07-04: 3 mg via INTRAVENOUS
  Administered 2017-07-04 – 2017-07-05 (×4): 2 mg via INTRAVENOUS
  Administered 2017-07-05 – 2017-07-10 (×6): 3 mg via INTRAVENOUS
  Filled 2017-07-04: qty 2
  Filled 2017-07-04: qty 1
  Filled 2017-07-04 (×2): qty 2
  Filled 2017-07-04: qty 1
  Filled 2017-07-04: qty 2
  Filled 2017-07-04: qty 1
  Filled 2017-07-04 (×2): qty 2
  Filled 2017-07-04: qty 1
  Filled 2017-07-04: qty 2

## 2017-07-04 MED ORDER — THIAMINE HCL 100 MG/ML IJ SOLN
100.0000 mg | Freq: Every day | INTRAMUSCULAR | Status: DC
  Administered 2017-07-04 – 2017-07-07 (×4): 100 mg via INTRAVENOUS
  Filled 2017-07-04 (×4): qty 2

## 2017-07-04 MED ORDER — HYDRALAZINE HCL 20 MG/ML IJ SOLN
10.0000 mg | INTRAMUSCULAR | Status: DC | PRN
  Administered 2017-07-04 – 2017-07-06 (×2): 10 mg via INTRAVENOUS
  Filled 2017-07-04 (×2): qty 1

## 2017-07-04 MED ORDER — FOLIC ACID 5 MG/ML IJ SOLN
1.0000 mg | Freq: Every day | INTRAMUSCULAR | Status: DC
  Administered 2017-07-04 – 2017-07-07 (×4): 1 mg via INTRAVENOUS
  Filled 2017-07-04 (×6): qty 0.2

## 2017-07-04 MED ORDER — LORAZEPAM 2 MG/ML IJ SOLN: 1.0000 mg | Freq: Once | INTRAMUSCULAR | Status: AC

## 2017-07-04 MED ORDER — ACETAMINOPHEN 160 MG/5ML PO SOLN: 650.0000 mg | ORAL | Status: DC | PRN

## 2017-07-04 MED ORDER — SODIUM CHLORIDE 0.9 % IV SOLN
INTRAVENOUS | Status: AC
  Administered 2017-07-04 (×2): via INTRAVENOUS

## 2017-07-04 MED ORDER — ENOXAPARIN SODIUM 40 MG/0.4ML ~~LOC~~ SOLN
40.0000 mg | SUBCUTANEOUS | Status: DC
  Administered 2017-07-04 – 2017-07-06 (×3): 40 mg via SUBCUTANEOUS
  Filled 2017-07-04 (×3): qty 0.4

## 2017-07-04 NOTE — H&P (Addendum)
History and Physical    Richard [name] OZH:[id number] DOB: Aug 14, 1945 DOA: 07/03/2017  PCP: Elias Else, MD  Patient coming from: Home.  Chief Complaint: Right-sided weakness.  Difficulty speaking.  HPI: Richard [name] is a 72 y.o. male with history of CAD status post stenting in the setting of ST elevation MI in 2017 March, history of V. fib arrest, history of intracranial hemorrhage in September 2016 in the setting of mild hypertension, alcohol abuse was brought to the ER after patient was found to have right-sided weakness with difficulty speaking.  As per the patient's son who provided the history patient started having some confusion and felt uncomfortable.  Patient was not able to explain exactly what is going on.  This happened around 7 PM yesterday.  At the time patient was noticed to have difficulty speaking with right-sided weakness.  EMS was called and patient was brought to the ER as a code stroke.  ED Course: CT head and CT angiogram of the head and neck were unremarkable.  While in the ER patient had 2 episodes of generalized tonic-clonic seizure.  Was given Ativan and Keppra loading dose.  Patient was seen by neurologist and admitted for further management of seizure.  At the time of my exam patient is agitated and has required at least 2 doses of Ativan.  Review of Systems: As per HPI, rest all negative.   Past Medical History:  Diagnosis Date  . CAD (coronary artery disease)    a. 05/2015 Inflat STEMI/PCI: LM nl, LAD 50p, Om1 100 (3.0 x 16mm Promus Premier DES), OM2/3 small, RCA 40p/d, EF 45-50%.  . Diabetes mellitus without complication (HCC)   . Diastolic dysfunction    a. 11/2014 Echo: EF 60-65%, gr1 DD, mildly dil RA.  . ETOH abuse   . H/O ventricular fibrillation    a. 05/2015 in setting of inferolateral STEMI.  . Hypertensive heart disease   . Intracranial hemorrhage (HCC)    a. 11/2014 in setting of marked HTN->3.5x2.5 left frontal ICH (12-76ml by MRI); b. 11/2014 MRA no  evidence of AVM or CAA; c. 11/2014 Carotid U/S: unremarkable.  . Left eye injury   . ST elevation MI (STEMI) (HCC) 06/12/15  . Stroke [facility])     Past Surgical History:  Procedure Laterality Date  . CORONARY ANGIOPLASTY    . LEFT HEART CATH AND CORONARY ANGIOGRAPHY N/A 06/12/2015   Procedure: Left Heart Cath and Coronary Angiography;  Surgeon: Lyn Records, MD;  Location: [facility] INVASIVE CV LAB;  Service: Cardiovascular;  Laterality: N/A;     reports that he has never smoked. He has never used smokeless tobacco. He reports that he does not drink alcohol or use drugs.  No Known Allergies  Family History  Problem Relation Age of Onset  . Stroke Maternal Grandmother   . Healthy Mother   . Other Father        hx unknown    Prior to Admission medications   Medication Sig Start Date End Date Taking? Authorizing Provider  amLODipine (NORVASC) 10 MG tablet Take 1 tablet (10 mg total) by mouth daily. 12/26/14  Yes Angiulli, Mcarthur Rossetti, PA-C  atorvastatin (LIPITOR) 40 MG tablet Take 80 mg by mouth daily.   Yes [provider]  carvedilol (COREG) 6.25 MG tablet Take 1 tablet (6.25 mg total) by mouth 2 (two) times daily with a meal. 06/14/15  Yes Creig Hines, NP  insulin NPH Human (HUMULIN N,NOVOLIN N) 100 UNIT/ML injection Inject 30-40 Units into the  skin See admin instructions. Use 40 units every morning and use 30 units every evening   Yes [provider]  lisinopril-hydrochlorothiazide (PRINZIDE,ZESTORETIC) 20-12.5 MG tablet Take 1 tablet by mouth daily.   Yes [provider]  nitroGLYCERIN (NITROSTAT) 0.4 MG SL tablet Place 1 tablet (0.4 mg total) under the tongue every 5 (five) minutes as needed for chest pain. 06/22/15  Yes Dyann KiefLenze, Michele M, PA-C  Multiple Vitamin (MULTIVITAMIN WITH MINERALS) TABS tablet Take 1 tablet by mouth daily. Patient not taking: Reported on 07/03/2017 12/26/14   Charlton Amorngiulli, Daniel J, PA-C    Physical Exam: Vitals:   07/03/17 2115  07/03/17 2130 07/03/17 2145 07/03/17 2316  BP: 113/77 112/78 (!) 154/87   Pulse: 79 77 75   Resp: 15 14 15    Temp:    97.8 F (36.6 C)  TempSrc:    Axillary  SpO2: 97% 97% 99%   Weight:      Height:          Constitutional: Moderately built and nourished. Vitals:   07/03/17 2115 07/03/17 2130 07/03/17 2145 07/03/17 2316  BP: 113/77 112/78 (!) 154/87   Pulse: 79 77 75   Resp: 15 14 15    Temp:    97.8 F (36.6 C)  TempSrc:    Axillary  SpO2: 97% 97% 99%   Weight:      Height:       Eyes: Anicteric no pallor. ENMT: No discharge from the ears eyes nose or mouth. Neck: No mass felt.  No neck rigidity no JVD appreciated. Respiratory: No rhonchi or crepitations. Cardiovascular: S1-S2 heard no murmurs appreciated. Abdomen: Soft nontender bowel sounds present. Musculoskeletal: No edema.  No joint effusion. Skin: No rash. Neurologic: Patient is alert awake but confused and encephalopathic.  Moves all extremities at this time.  Still not talking.  Pupils are reacting on the right side.  Left eye is fake. Psychiatric: Confused.   Labs on Admission: I have personally reviewed following labs and imaging studies  CBC: Recent Labs  Lab 07/03/17 2003 07/03/17 2010  WBC 11.1*  --   NEUTROABS 7.1  --   HGB 13.5 14.6  HCT 40.5 43.0  MCV 91.0  --   PLT 227  --    Basic Metabolic Panel: Recent Labs  Lab 07/03/17 2003 07/03/17 2010  NA 135 135  K 3.9 4.3  CL 95* 97*  CO2 27  --   GLUCOSE 248* 246*  BUN 15 19  CREATININE 1.08 1.00  CALCIUM 9.8  --    GFR: Estimated Creatinine Clearance: 76.6 mL/min (by C-G formula based on SCr of 1 mg/dL). Liver Function Tests: Recent Labs  Lab 07/03/17 2003  AST 19  ALT 17  ALKPHOS 76  BILITOT 1.0  PROT 7.0  ALBUMIN 4.0   No results for input(s): LIPASE, AMYLASE in the last 168 hours. No results for input(s): AMMONIA in the last 168 hours. Coagulation Profile: Recent Labs  Lab 07/03/17 2003  INR 1.00   Cardiac  Enzymes: No results for input(s): CKTOTAL, CKMB, CKMBINDEX, TROPONINI in the last 168 hours. BNP (last 3 results) No results for input(s): PROBNP in the last 8760 hours. HbA1C: No results for input(s): HGBA1C in the last 72 hours. CBG: Recent Labs  Lab 07/03/17 2032 07/04/17 0112  GLUCAP 248* 230*   Lipid Profile: No results for input(s): CHOL, HDL, LDLCALC, TRIG, CHOLHDL, LDLDIRECT in the last 72 hours. Thyroid Function Tests: No results for input(s): TSH, T4TOTAL, FREET4, T3FREE, THYROIDAB in  the last 72 hours. Anemia Panel: No results for input(s): VITAMINB12, FOLATE, FERRITIN, TIBC, IRON, RETICCTPCT in the last 72 hours. Urine analysis:    Component Value Date/Time   COLORURINE YELLOW 12/17/2014 1624   APPEARANCEUR CLEAR 12/17/2014 1624   LABSPEC 1.020 12/17/2014 1624   PHURINE 5.0 12/17/2014 1624   GLUCOSEU >1000 (A) 12/17/2014 1624   HGBUR LARGE (A) 12/17/2014 1624   BILIRUBINUR NEGATIVE 12/17/2014 1624   KETONESUR NEGATIVE 12/17/2014 1624   PROTEINUR NEGATIVE 12/17/2014 1624   UROBILINOGEN 1.0 12/17/2014 1624   NITRITE NEGATIVE 12/17/2014 1624   LEUKOCYTESUR NEGATIVE 12/17/2014 1624   Sepsis Labs: @LABRCNTIP (procalcitonin:4,lacticidven:4) )No results found for this or any previous visit (from the past 240 hour(s)).   Radiological Exams on Admission: Ct Angio Head W Or Wo Contrast  Result Date: 07/03/2017 CLINICAL DATA:  Initial evaluation for acute stroke. EXAM: CT ANGIOGRAPHY HEAD AND NECK TECHNIQUE: Multidetector CT imaging of the head and neck was performed using the standard protocol during bolus administration of intravenous contrast. Multiplanar CT image reconstructions and MIPs were obtained to evaluate the vascular anatomy. Carotid stenosis measurements (when applicable) are obtained utilizing NASCET criteria, using the distal internal carotid diameter as the denominator. Multiphase CT imaging of the brain was performed following IV bolus contrast injection.  Subsequent parametric perfusion maps were calculated using RAPID software. CONTRAST:  50mL ISOVUE-370 IOPAMIDOL (ISOVUE-370) INJECTION 76% COMPARISON:  None. FINDINGS: CT HEAD FINDINGS Brain: Generalized age-related cerebral atrophy. Patchy and confluent hypodensity within the periventricular and deep white matter both cerebral hemispheres, most consistent with chronic small vessel ischemic disease, fairly advanced in nature. Encephalomalacia within the posterior left frontal region, consistent with remote infarct/hemorrhage as seen on prior CT. Additional area of encephalomalacia within the right frontal lobe also consistent with remote infarct, stable from previous. Small remote left occipital infarct noted. Few small remote bilateral basal ganglia lacunar infarcts. No acute intracranial hemorrhage. No evidence for acute large vessel territory infarct. No mass lesion, midline shift or mass effect. No hydrocephalus. No extra-axial fluid collection. Vascular: No asymmetric hyperdense vessel. Calcified atherosclerosis present at the skull base. Skull: Scalp soft tissues within normal limits.  Calvarium intact. Sinuses/Orbits: Left ocular prosthesis noted. Globes and orbital soft tissues demonstrate no acute abnormality. Mild scattered mucosal thickening within the left sphenoid sinus. Paranasal sinuses are otherwise largely clear. No mastoid effusion. Other: None. ASPECTS ([place]berta Stroke Program Early CT Score) - Ganglionic level infarction (caudate, lentiform nuclei, internal capsule, insula, M1-M3 cortex): 7 - Supraganglionic infarction (M4-M6 cortex): 3 Total score (0-10 with 10 being normal): 10 Review of the MIP images confirms the above findings Prior to the study being completed, the examination was discontinued by the ordering provider. Scalp AP and lateral tomogram images only are provided. IMPRESSION: 1. No acute intracranial abnormality. 2. ASPECTS = 10. 3. Moderate cerebral atrophy with chronic small  vessel ischemic disease with multifocal remote ischemic infarcts as above. 4. The CTA portion of this exam was discontinued by the ordering provider prior to the study being performed. Only topogram images are provided. No charges should be applied to the patient. Electronically Signed   By: Rise Mu M.D.   On: 07/03/2017 20:44   Ct Angio Head W Or Wo Contrast  Result Date: 07/03/2017 CLINICAL DATA:  Initial evaluation for acute stroke. EXAM: CT ANGIOGRAPHY HEAD AND NECK TECHNIQUE: Multidetector CT imaging of the head and neck was performed using the standard protocol during bolus administration of intravenous contrast. Multiplanar CT image reconstructions and MIPs were obtained  to evaluate the vascular anatomy. Carotid stenosis measurements (when applicable) are obtained utilizing NASCET criteria, using the distal internal carotid diameter as the denominator. Multiphase CT imaging of the brain was performed following IV bolus contrast injection. Subsequent parametric perfusion maps were calculated using RAPID software. CONTRAST:  50mL ISOVUE-370 IOPAMIDOL (ISOVUE-370) INJECTION 76% COMPARISON:  None. FINDINGS: CT HEAD FINDINGS Brain: Generalized age-related cerebral atrophy. Patchy and confluent hypodensity within the periventricular and deep white matter both cerebral hemispheres, most consistent with chronic small vessel ischemic disease, fairly advanced in nature. Encephalomalacia within the posterior left frontal region, consistent with remote infarct/hemorrhage as seen on prior CT. Additional area of encephalomalacia within the right frontal lobe also consistent with remote infarct, stable from previous. Small remote left occipital infarct noted. Few small remote bilateral basal ganglia lacunar infarcts. No acute intracranial hemorrhage. No evidence for acute large vessel territory infarct. No mass lesion, midline shift or mass effect. No hydrocephalus. No extra-axial fluid collection. Vascular:  No asymmetric hyperdense vessel. Calcified atherosclerosis present at the skull base. Skull: Scalp soft tissues within normal limits.  Calvarium intact. Sinuses/Orbits: Left ocular prosthesis noted. Globes and orbital soft tissues demonstrate no acute abnormality. Mild scattered mucosal thickening within the left sphenoid sinus. Paranasal sinuses are otherwise largely clear. No mastoid effusion. Other: None. ASPECTS ([place] Stroke Program Early CT Score) - Ganglionic level infarction (caudate, lentiform nuclei, internal capsule, insula, M1-M3 cortex): 7 - Supraganglionic infarction (M4-M6 cortex): 3 Total score (0-10 with 10 being normal): 10 Review of the MIP images confirms the above findings Prior to the study being completed, the examination was discontinued by the ordering provider. Scalp AP and lateral tomogram images only are provided. IMPRESSION: 1. No acute intracranial abnormality. 2. ASPECTS = 10. 3. Moderate cerebral atrophy with chronic small vessel ischemic disease with multifocal remote ischemic infarcts as above. 4. The CTA portion of this exam was discontinued by the ordering provider prior to the study being performed. Only topogram images are provided. No charges should be applied to the patient. Electronically Signed   By: Rise Mu M.D.   On: 07/03/2017 20:44   Ct Angio Neck W Or Wo Contrast  Result Date: 07/03/2017 CLINICAL DATA:  Initial evaluation for acute stroke. EXAM: CT ANGIOGRAPHY HEAD AND NECK TECHNIQUE: Multidetector CT imaging of the head and neck was performed using the standard protocol during bolus administration of intravenous contrast. Multiplanar CT image reconstructions and MIPs were obtained to evaluate the vascular anatomy. Carotid stenosis measurements (when applicable) are obtained utilizing NASCET criteria, using the distal internal carotid diameter as the denominator. Multiphase CT imaging of the brain was performed following IV bolus contrast injection.  Subsequent parametric perfusion maps were calculated using RAPID software. CONTRAST:  50mL ISOVUE-370 IOPAMIDOL (ISOVUE-370) INJECTION 76% COMPARISON:  None. FINDINGS: CT HEAD FINDINGS Brain: Generalized age-related cerebral atrophy. Patchy and confluent hypodensity within the periventricular and deep white matter both cerebral hemispheres, most consistent with chronic small vessel ischemic disease, fairly advanced in nature. Encephalomalacia within the posterior left frontal region, consistent with remote infarct/hemorrhage as seen on prior CT. Additional area of encephalomalacia within the right frontal lobe also consistent with remote infarct, stable from previous. Small remote left occipital infarct noted. Few small remote bilateral basal ganglia lacunar infarcts. No acute intracranial hemorrhage. No evidence for acute large vessel territory infarct. No mass lesion, midline shift or mass effect. No hydrocephalus. No extra-axial fluid collection. Vascular: No asymmetric hyperdense vessel. Calcified atherosclerosis present at the skull base. Skull: Scalp soft tissues within  normal limits.  Calvarium intact. Sinuses/Orbits: Left ocular prosthesis noted. Globes and orbital soft tissues demonstrate no acute abnormality. Mild scattered mucosal thickening within the left sphenoid sinus. Paranasal sinuses are otherwise largely clear. No mastoid effusion. Other: None. ASPECTS ([place] Stroke Program Early CT Score) - Ganglionic level infarction (caudate, lentiform nuclei, internal capsule, insula, M1-M3 cortex): 7 - Supraganglionic infarction (M4-M6 cortex): 3 Total score (0-10 with 10 being normal): 10 Review of the MIP images confirms the above findings Prior to the study being completed, the examination was discontinued by the ordering provider. Scalp AP and lateral tomogram images only are provided. IMPRESSION: 1. No acute intracranial abnormality. 2. ASPECTS = 10. 3. Moderate cerebral atrophy with chronic small  vessel ischemic disease with multifocal remote ischemic infarcts as above. 4. The CTA portion of this exam was discontinued by the ordering provider prior to the study being performed. Only topogram images are provided. No charges should be applied to the patient. Electronically Signed   By: Rise Mu M.D.   On: 07/03/2017 20:44   Ct Head Code Stroke Wo Contrast  Result Date: 07/03/2017 CLINICAL DATA:  Initial evaluation for acute stroke. EXAM: CT ANGIOGRAPHY HEAD AND NECK TECHNIQUE: Multidetector CT imaging of the head and neck was performed using the standard protocol during bolus administration of intravenous contrast. Multiplanar CT image reconstructions and MIPs were obtained to evaluate the vascular anatomy. Carotid stenosis measurements (when applicable) are obtained utilizing NASCET criteria, using the distal internal carotid diameter as the denominator. Multiphase CT imaging of the brain was performed following IV bolus contrast injection. Subsequent parametric perfusion maps were calculated using RAPID software. CONTRAST:  50mL ISOVUE-370 IOPAMIDOL (ISOVUE-370) INJECTION 76% COMPARISON:  None. FINDINGS: CT HEAD FINDINGS Brain: Generalized age-related cerebral atrophy. Patchy and confluent hypodensity within the periventricular and deep white matter both cerebral hemispheres, most consistent with chronic small vessel ischemic disease, fairly advanced in nature. Encephalomalacia within the posterior left frontal region, consistent with remote infarct/hemorrhage as seen on prior CT. Additional area of encephalomalacia within the right frontal lobe also consistent with remote infarct, stable from previous. Small remote left occipital infarct noted. Few small remote bilateral basal ganglia lacunar infarcts. No acute intracranial hemorrhage. No evidence for acute large vessel territory infarct. No mass lesion, midline shift or mass effect. No hydrocephalus. No extra-axial fluid collection.  Vascular: No asymmetric hyperdense vessel. Calcified atherosclerosis present at the skull base. Skull: Scalp soft tissues within normal limits.  Calvarium intact. Sinuses/Orbits: Left ocular prosthesis noted. Globes and orbital soft tissues demonstrate no acute abnormality. Mild scattered mucosal thickening within the left sphenoid sinus. Paranasal sinuses are otherwise largely clear. No mastoid effusion. Other: None. ASPECTS ([place] Stroke Program Early CT Score) - Ganglionic level infarction (caudate, lentiform nuclei, internal capsule, insula, M1-M3 cortex): 7 - Supraganglionic infarction (M4-M6 cortex): 3 Total score (0-10 with 10 being normal): 10 Review of the MIP images confirms the above findings Prior to the study being completed, the examination was discontinued by the ordering provider. Scalp AP and lateral tomogram images only are provided. IMPRESSION: 1. No acute intracranial abnormality. 2. ASPECTS = 10. 3. Moderate cerebral atrophy with chronic small vessel ischemic disease with multifocal remote ischemic infarcts as above. 4. The CTA portion of this exam was discontinued by the ordering provider prior to the study being performed. Only topogram images are provided. No charges should be applied to the patient. Electronically Signed   By: Rise Mu M.D.   On: 07/03/2017 20:44    EKG: Independently  reviewed.  Normal sinus rhythm with T wave inversions in the lateral leads.  Assessment/Plan Principal Problem:   Seizures (HCC) Active Problems:   Essential hypertension   CAD (coronary artery disease)   Alcohol abuse   Acute encephalopathy   Seizure (HCC)    1. Seizure -new onset seizure.  Discussed with neurologist.  Patient is on Keppra 750 mg twice daily after Keppra loading dose was given.  EEG.  Patient on PRN Ativan. 2. Alcohol abuse with withdrawal -patient placed on CIWA protocol.  Patient is in active withdrawal.  If continues to worsen may need Precedex drip.  Did  discuss with pulmonary critical care. 3. CAD status post stenting in the setting of ST elevation MI on March 2017 -presently n.p.o. 4. Hypertension presently since patient is n.p.o. I have placed patient on PRN IV hydralazine. 5. History of intracranial hemorrhage in the setting of marked hypertension.  Closely follow blood pressure trends presently patient is on PRN IV hydralazine. 6. Diabetes mellitus type 2 -patient takes NPH insulin.  Presently his sugars are running in the low range.  I have kept patient on sliding scale coverage with every 4 checks.  Follow metabolic panel and CBGs closely.   DVT prophylaxis: Lovenox. Code Status: Full code. Family Communication: Patient's son. Disposition Plan: Home. Consults called: Neurologist. Admission status: Observation.   Eduard Clos MD [facility] Pager [phone].  If 7PM-7AM, please contact night-coverage [email or web address] Password [email or web address]  07/04/2017, 1:15 AM

## 2017-07-04 NOTE — Progress Notes (Signed)
PROGRESS NOTE  Subjective: Richard [name] is a 72 y.o. 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 CT head and CTA head/neck were unremarkable and the patient had evident seizure-like activity while in the ED. Neurology was consulted, seizure terminated with ativan and keppra load.   Objective: BP (!) 178/79 (BP Location: Right Leg)   Pulse 70   Temp 98.6 F (37 C)   Resp (!) 21   Ht 6\' 1"  (1.854 m)   Wt 91.5 kg (201 lb 11.5 oz)   SpO2 95%   BMI 26.61 kg/m   Gen: Elderly male laying in bed with soft restraints in no distress Pulm: Clear bilaterally and nonlabored on room air  CV: RRR, no murmur, no JVD, no edema GI: Soft, NT, ND, +BS  GU: Condom catheter Neuro: Alert, not oriented. Left eye prosthesis, right reactive. Not cooperative with exam, some dysphasia noted. Moving all extremities. Skin: No wounds or rashes.  Assessment & Plan: New-onset complex focal seizure: Suspect this is in the setting of left parietal encephalomalacia and possible alcohol withdrawal. - Neurology following. EEG results pending.  - Continue keppra 750mg  BID following load - Ativan prn seizure - Monitor neuro status in SDU - Soft restraints only as needed  Alcohol withdrawal:  - Continue CIWA; scores have been 18 and 5 since admission this morning.   Otherwise, continue with plan per H&P from this morning by Dr. Toniann FailKakrakandy.   Hazeline Junkeryan Eriq Hufford, MD [facility] Pager [phone] 07/04/2017, 10:36 AM

## 2017-07-04 NOTE — Evaluation (Signed)
Clinical/Bedside Swallow Evaluation Patient Details  Name: Richard [name] MRN: [id number] Date of Birth: Jul 04, 1945  Today's Date: 07/04/2017 Time: SLP Start Time (ACUTE ONLY): 1320 SLP Stop Time (ACUTE ONLY): 1328 SLP Time Calculation (min) (ACUTE ONLY): 8 min  Past Medical History:  Past Medical History:  Diagnosis Date  . CAD (coronary artery disease)    a. 05/2015 Inflat STEMI/PCI: LM nl, LAD 50p, Om1 100 (3.0 x 16mm Promus Premier DES), OM2/3 small, RCA 40p/d, EF 45-50%.  . Diabetes mellitus without complication (HCC)   . Diastolic dysfunction    a. 11/2014 Echo: EF 60-65%, gr1 DD, mildly dil RA.  . ETOH abuse   . H/O ventricular fibrillation    a. 05/2015 in setting of inferolateral STEMI.  . Hypertensive heart disease   . Intracranial hemorrhage (HCC)    a. 11/2014 in setting of marked HTN->3.5x2.5 left frontal ICH (12-77ml by MRI); b. 11/2014 MRA no evidence of AVM or CAA; c. 11/2014 Carotid U/S: unremarkable.  . Left eye injury   . ST elevation MI (STEMI) (HCC) 06/12/15  . Stroke [facility])    Past Surgical History:  Past Surgical History:  Procedure Laterality Date  . CORONARY ANGIOPLASTY    . LEFT HEART CATH AND CORONARY ANGIOGRAPHY N/A 06/12/2015   Procedure: Left Heart Cath and Coronary Angiography;  Surgeon: Lyn Records, MD;  Location: [facility] INVASIVE CV LAB;  Service: Cardiovascular;  Laterality: N/A;   HPI:  Richard [name] is a 72 y.o. male with history of CAD status post stenting in the setting of ST elevation MI in 2017 March, history of V. fib arrest, history of intracranial hemorrhage in September 2016 in the setting of mild hypertension, alcohol abuse was brought to the ER after patient was found to have right-sided weakness with difficulty speaking.  EMS was called and patient was brought to the ER as a code stroke.  CT negative for acute intracranial process.   Assessment / Plan / Recommendation Pt presents with a mild cognitively based dysphagia characterized.  Pt was awake and  alert throughout evaluation but was restless and distractible throughout PO intake.  Per RN, pt has been requiring restraints and Ativan for agitation while hospitalized.  No overt s/s of aspiration were evident with solids or liquids.  Pt was able to clear boluses from the oral cavity without difficulty.  Therefore, recommend initiating a dys 3 diet with thin liquids and full staff supervision for use of swallowing precautions.                        Prognosis Prognosis for Safe Diet Advancement: Good Barriers to Reach Goals: Cognitive deficits      Swallow Study   General Date of Onset: 07/03/17 HPI: Richard [name] is a 72 y.o. male with history of CAD status post stenting in the setting of ST elevation MI in 2017 March, history of V. fib arrest, history of intracranial hemorrhage in September 2016 in the setting of mild hypertension, alcohol abuse was brought to the ER after patient was found to have right-sided weakness with difficulty speaking.  EMS was called and patient was brought to the ER as a code stroke.  CT negative for acute intracranial process. Type of Study: Bedside Swallow Evaluation Previous Swallow Assessment: last seen for swallowing in 11/2014 post ICH Diet Prior to this Study: NPO Temperature Spikes Noted: No Respiratory Status: Room air History of Recent Intubation: No Behavior/Cognition: Confused;Distractible Oral Cavity Assessment: Dry Oral Care Completed by  SLP: Yes Oral Cavity - Dentition: Poor condition;Missing dentition Self-Feeding Abilities: (restrained due to agitation) Patient Positioning: Upright in bed Baseline Vocal Quality: Normal Volitional Cough: Cognitively unable to elicit Volitional Swallow: Unable to elicit    Oral/Motor/Sensory Function Overall Oral Motor/Sensory Function: Other (comment)(difficult to assess due to cognition)   Ice Chips     Thin Liquid Thin Liquid: Within functional limits    Nectar Thick     Honey Thick     Puree Puree:  Within functional limits   Solid   GO   Solid: Within functional limits    Functional Assessment Tool Used: bedside swallow evaluation Functional Limitations: Swallowing Swallow Current Status (Z6109(G8996): At least 20 percent but less than 40 percent impaired, limited or restricted Swallow Goal Status [phone](G8997): At least 1 percent but less than 20 percent impaired, limited or restricted   Richard LombardPage, Alin Hutchins L 07/04/2017,1:58 PM

## 2017-07-04 NOTE — Progress Notes (Signed)
EEG Completed; Results Pending  

## 2017-07-04 NOTE — Procedures (Signed)
ELECTROENCEPHALOGRAM REPORT  Date of Study: 07/04/2017  Patient's Name: Richard JakschLeon [name] MRN: [id number] Date of Birth: 02-15-1946  Referring Provider: Elba BarmanSushanth R Aroor, MD  Clinical History: 72 year old male with sudden onset aphasia and confusion found to have right sided twitching.  Medications: hydralazine Keppra Ativan  Technical Summary: A multichannel digital EEG recording measured by the international 10-20 system with electrodes applied with paste and impedances below 5000 ohms performed as portable with EKG monitoring in an awake and drowsy patient.  Hyperventilation and photic stimulation were not performed.  The digital EEG was referentially recorded, reformatted, and digitally filtered in a variety of bipolar and referential montages for optimal display.   Description: The patient is awake but mostly drowsy during the recording.  During maximal wakefulness, there is a symmetric, medium voltage 8 Hz posterior dominant rhythm that attenuates with eye opening. This is admixed with a mild amount of diffuse 4-5 Hz theta and 2-3 Hz delta slowing of the waking background.  During drowsiness, there is an increase in theta slowing of the background.  Stage 2 sleep not seen.  There were no epileptiform discharges or electrographic seizures seen.    EKG lead was unremarkable.  Impression: This awake and drowsy EEG is abnormal due to mild diffuse slowing of the waking background.  Clinical Correlation of the above findings indicates diffuse cerebral dysfunction that is non-specific in etiology and can be seen with hypoxic/ischemic injury, toxic/metabolic encephalopathies, neurodegenerative disorders, or medication effect.  The absence of epileptiform discharges does not rule out a clinical diagnosis of epilepsy.  Clinical correlation is advised.   Shon MilletAdam Goku Harb, DO

## 2017-07-04 NOTE — Progress Notes (Addendum)
Patient agitated in bed, pulling at lines, tubes, and removing equipment. Pt not remaining calm for blood pressure check. MD paged twice. New orders placed.

## 2017-07-04 NOTE — Evaluation (Signed)
Speech Language Pathology Evaluation Patient Details Name: Richard JakschLeon [name] MRN: [id number] DOB: 08-08-1945 Today's Date: 07/04/2017 Time: [id number] SLP Time Calculation (min) (ACUTE ONLY): 8 min  Problem List:  Patient Active Problem List   Diagnosis Date Noted  . Alcohol abuse 07/04/2017  . Acute encephalopathy 07/04/2017  . Seizure (HCC) 07/04/2017  . Seizures (HCC) 07/03/2017  . CAD (coronary artery disease) 06/22/2015  . Hypertensive heart disease 06/14/2015  . Hyperlipidemia 06/14/2015  . 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 Aphasia complicating stroke 02/09/2015  . 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 Type 2 diabetes mellitus with peripheral neuropathy (HCC)   . Essential hypertension   . Alcohol withdrawal (HCC)   . Cerebral hemorrhage (HCC)   . Endotracheally intubated    Past Medical History:  Past Medical History:  Diagnosis Date  . CAD (coronary artery disease)    a. 05/2015 Inflat STEMI/PCI: LM nl, LAD 50p, Om1 100 (3.0 x 16mm Promus Premier DES), OM2/3 small, RCA 40p/d, EF 45-50%.  . Diabetes mellitus without complication (HCC)   . Diastolic dysfunction    a. 11/2014 Echo: EF 60-65%, gr1 DD, mildly dil RA.  . ETOH abuse   . H/O ventricular fibrillation    a. 05/2015 in setting of inferolateral STEMI.  . Hypertensive heart disease   . Intracranial hemorrhage (HCC)    a. 11/2014 in setting of marked HTN->3.5x2.5 left frontal ICH (12-5315ml by MRI); b. 11/2014 MRA no evidence of AVM or CAA; c. 11/2014 Carotid U/S: unremarkable.  . Left eye injury   . ST elevation MI (STEMI) (HCC) 06/12/15  . Stroke [facility](HCC)    Past Surgical History:  Past Surgical History:  Procedure Laterality Date  . CORONARY ANGIOPLASTY    . LEFT HEART CATH AND CORONARY ANGIOGRAPHY N/A 06/12/2015   Procedure: Left Heart Cath and Coronary Angiography;  Surgeon: Lyn RecordsSmith, Henry W, MD;  [facility];  Service:  Cardiovascular;  Laterality: N/A;   HPI:  Richard JakschLeon Steuck is a 72 y.o. male with history of CAD status post stenting in the setting of ST elevation MI in 2017 March, history of V. fib arrest, history of intracranial hemorrhage in September 2016 in the setting of mild hypertension, alcohol abuse was brought to the ER after patient was found to have right-sided weakness with difficulty speaking.  EMS was called and patient was brought to the ER as a code stroke.  CT negative for acute intracranial process.   Assessment / Plan / Recommendation Clinical Impression   Pt presents with marked cognitive dysfunction.  Pt is oriented to self only and exhibits decreased focused attention to stimulation which impacts all higher level cognitive processes.  Pt exhibits little initiation of functional communication and can only intermittently follow 1 step commands.  Pt perseveratively asked "what's this," throughout today's evaluation but was unable to answer therapist's questions appropriately.  Nursing reports that pt is requiring Ativan and restraints for safety at this time due to agitation.  Per chart review, pt has history of ICH in 2016 and discharged from CIR with deficits in word finding and safety awareness.  Given the abovementioned deficits, pt would benefit from 24/7 supervision at discharge and potentially ST follow up at next level fo care.      SLP Assessment  SLP Visit Diagnosis: Dysphagia, unspecified (R13.10);Cognitive communication deficit (R41.841)    Follow Up Recommendations  24 hour supervision/assistance    Frequency and Duration min 1 x/week  SLP Evaluation Cognition  Overall Cognitive Status: Impaired/Different from baseline Arousal/Alertness: Awake/alert Orientation Level: Oriented to person Attention: Focused Focused Attention: Impaired Focused Attention Impairment: Functional basic;Verbal basic Behaviors: Perseveration;Restless Safety/Judgment: Impaired        Comprehension  Auditory Comprehension Overall Auditory Comprehension: Other (comment)(impacted by cognition) Yes/No Questions: Impaired Basic Biographical Questions: 0-25% accurate Commands: Impaired One Step Basic Commands: 0-24% accurate    Expression Verbal Expression Overall Verbal Expression: Impaired Initiation: Impaired Level of Generative/Spontaneous Verbalization: Phrase Other Verbal Expression Comments: perseverative, asked "what's this?" repeatedly   Oral / Motor  Oral Motor/Sensory Function Overall Oral Motor/Sensory Function: Other (comment)(difficult to assess due to mentation) Motor Speech Overall Motor Speech: Other (comment)(difficult to assess)   GO          Functional Assessment Tool Used: cognitive-linguistic evaluation completed Functional Limitations: Attention Swallow Current Status (Z6109): At least 80 percent but less than 100 percent impaired, limited or restricted Swallow Goal Status [phone]): At least 40 percent but less than 60 percent impaired, limited or restricted         Maryjane Hurter 07/04/2017, 2:09 PM

## 2017-07-04 NOTE — Progress Notes (Signed)
EEG shows diffuse slowing of the waking background. No electrographic seizures seen; however, the diffuse slowing may be a postictal phenomenon.   A/R: Complex partial seizure, new onset 1. Continue Keppra now and at discharge 2. Obtain MRI brain if no contraindications 3. Will need outpatient neurology follow up 4. [place] [facility] [place], patients with seizures are not allowed to drive until  they have been seizure-free for six months. Use caution when using heavy equipment or power tools. Avoid working on ladders or at heights. Take showers instead of baths. Ensure the water temperature is not too high on the home water heater. Do not go swimming alone. When caring for infants or small children, sit down when holding, feeding, or changing them to minimize risk of injury to the child in the event you have a seizure. Also, Maintain good sleep hygiene. Avoid alcohol.  Electronically signed: Dr. Caryl PinaEric Mylen Mangan

## 2017-07-04 NOTE — Progress Notes (Signed)
Inpatient Diabetes Program Recommendations  AACE/ADA: New Consensus Statement on Inpatient Glycemic Control (2015)  Target Ranges:  Prepandial:   less than 140 mg/dL      Peak postprandial:   less than 180 mg/dL (1-2 hours)      Critically ill patients:  140 - 180 mg/dL   Lab Results  Component Value Date   GLUCAP 152 (H) 07/04/2017   HGBA1C 7.5 (H) 06/12/2015    Review of Glycemic Control Results for Richard [name], Richard [name] (MRN [id number]) as of 07/04/2017 11:21  Ref. Range 07/03/2017 20:32 07/04/2017 01:12 07/04/2017 05:01 07/04/2017 08:39  Glucose-Capillary Latest Ref Range: 65 - 99 mg/dL 829248 (H) 562230 (H) 130129 (H) 152 (H)  Diabetes history: Type 2 DM Outpatient Diabetes medications: NPH- 40 units in AM, 30 units QPM Current orders for Inpatient glycemic control: Novolog 0-9 units Q4H  Inpatient Diabetes Program Recommendations:    Last A1C from 2017 was 7.5%. Consider repeating A1C?  Thanks, Lujean RaveLauren Luann Aspinwall, MSN, RNC-OB Diabetes Coordinator [phone] (8a-5p)

## 2017-07-05 ENCOUNTER — Inpatient Hospital Stay (HOSPITAL_COMMUNITY): Payer: PPO

## 2017-07-05 LAB — BASIC METABOLIC PANEL
Anion gap: 17 — ABNORMAL HIGH (ref 5–15)
BUN: 8 mg/dL (ref 6–20)
CHLORIDE: 101 mmol/L (ref 101–111)
CO2: 19 mmol/L — ABNORMAL LOW (ref 22–32)
Calcium: 8.5 mg/dL — ABNORMAL LOW (ref 8.9–10.3)
Creatinine, Ser: 1.07 mg/dL (ref 0.61–1.24)
GFR calc Af Amer: >60 mL/min (ref >60)
GFR calc non Af Amer: >60 mL/min (ref >60)
Glucose, Bld: 180 mg/dL — ABNORMAL HIGH (ref 65–99)
POTASSIUM: 3.2 mmol/L — AB (ref 3.5–5.1)
SODIUM: 137 mmol/L (ref 135–145)

## 2017-07-05 LAB — CBC WITH DIFFERENTIAL/PLATELET
Basophils Absolute: 0 10*3/uL (ref 0.0–0.1)
Basophils Relative: 0 %
EOS PCT: 0 %
Eosinophils Absolute: 0 10*3/uL (ref 0.0–0.7)
HCT: 36.3 % — ABNORMAL LOW (ref 39.0–52.0)
Hemoglobin: 11.8 g/dL — ABNORMAL LOW (ref 13.0–17.0)
LYMPHS ABS: 0.9 10*3/uL (ref 0.7–4.0)
Lymphocytes Relative: 8 %
MCH: 29.9 pg (ref 26.0–34.0)
MCHC: 32.5 g/dL (ref 30.0–36.0)
MCV: 92.1 fL (ref 78.0–100.0)
MONOS PCT: 6 %
Monocytes Absolute: 0.6 10*3/uL (ref 0.1–1.0)
Neutro Abs: 9.2 10*3/uL — ABNORMAL HIGH (ref 1.7–7.7)
Neutrophils Relative %: 86 %
PLATELETS: 202 10*3/uL (ref 150–400)
RBC: 3.94 MIL/uL — ABNORMAL LOW (ref 4.22–5.81)
RDW: 12.4 % (ref 11.5–15.5)
WBC: 10.7 10*3/uL — AB (ref 4.0–10.5)

## 2017-07-05 LAB — GLUCOSE, CAPILLARY
GLUCOSE-CAPILLARY: 218 mg/dL — AB (ref 65–99)
GLUCOSE-CAPILLARY: 149 mg/dL — AB (ref 65–99)
Glucose-Capillary: 100 mg/dL — ABNORMAL HIGH (ref 65–99)
Glucose-Capillary: 208 mg/dL — ABNORMAL HIGH (ref 65–99)
Glucose-Capillary: 94 mg/dL (ref 65–99)
Glucose-Capillary: 156 mg/dL — ABNORMAL HIGH (ref 65–99)

## 2017-07-05 LAB — MAGNESIUM: MAGNESIUM: 1.5 mg/dL — AB (ref 1.7–2.4)

## 2017-07-05 LAB — PHOSPHORUS: Phosphorus: 2.3 mg/dL — ABNORMAL LOW (ref 2.5–4.6)

## 2017-07-05 LAB — HEMOGLOBIN A1C
Hgb A1c MFr Bld: 6.3 % — ABNORMAL HIGH (ref 4.8–5.6)
Mean Plasma Glucose: 134.11 mg/dL

## 2017-07-05 MED ORDER — CARVEDILOL 6.25 MG PO TABS
6.2500 mg | ORAL_TABLET | Freq: Two times a day (BID) | ORAL | Status: DC
  Administered 2017-07-05 – 2017-07-13 (×16): 6.25 mg via ORAL
  Filled 2017-07-05 (×16): qty 1

## 2017-07-05 MED ORDER — ATORVASTATIN CALCIUM 80 MG PO TABS
80.0000 mg | ORAL_TABLET | Freq: Every day | ORAL | Status: DC
  Administered 2017-07-05 – 2017-07-12 (×7): 80 mg via ORAL
  Filled 2017-07-05 (×8): qty 1

## 2017-07-05 MED ORDER — NITROGLYCERIN 0.4 MG SL SUBL: 0.4000 mg | SUBLINGUAL_TABLET | SUBLINGUAL | Status: DC | PRN

## 2017-07-05 MED ORDER — MAGNESIUM SULFATE 2 GM/50ML IV SOLN
2.0000 g | Freq: Once | INTRAVENOUS | Status: AC
  Administered 2017-07-05: 2 g via INTRAVENOUS
  Filled 2017-07-05: qty 50

## 2017-07-05 MED ORDER — SODIUM CHLORIDE 0.9 % IV SOLN
INTRAVENOUS | Status: AC
  Administered 2017-07-05 – 2017-07-06 (×2): via INTRAVENOUS

## 2017-07-05 MED ORDER — INSULIN ASPART 100 UNIT/ML ~~LOC~~ SOLN
0.0000 [IU] | Freq: Three times a day (TID) | SUBCUTANEOUS | Status: DC
  Administered 2017-07-05: 3 [IU] via SUBCUTANEOUS
  Administered 2017-07-06 (×2): 2 [IU] via SUBCUTANEOUS
  Administered 2017-07-06: 3 [IU] via SUBCUTANEOUS
  Administered 2017-07-07: 2 [IU] via SUBCUTANEOUS
  Administered 2017-07-07: 3 [IU] via SUBCUTANEOUS
  Administered 2017-07-07 – 2017-07-08 (×3): 2 [IU] via SUBCUTANEOUS
  Administered 2017-07-08: 5 [IU] via SUBCUTANEOUS
  Administered 2017-07-09 (×2): 3 [IU] via SUBCUTANEOUS
  Administered 2017-07-09: 7 [IU] via SUBCUTANEOUS
  Administered 2017-07-10: 2 [IU] via SUBCUTANEOUS
  Administered 2017-07-10: 5 [IU] via SUBCUTANEOUS
  Administered 2017-07-10: 2 [IU] via SUBCUTANEOUS
  Administered 2017-07-11: 5 [IU] via SUBCUTANEOUS
  Administered 2017-07-11: 3 [IU] via SUBCUTANEOUS
  Administered 2017-07-11 – 2017-07-12 (×2): 2 [IU] via SUBCUTANEOUS
  Administered 2017-07-12: 9 [IU] via SUBCUTANEOUS
  Administered 2017-07-13: 3 [IU] via SUBCUTANEOUS

## 2017-07-05 MED ORDER — POTASSIUM CHLORIDE 10 MEQ/100ML IV SOLN
10.0000 meq | INTRAVENOUS | Status: AC
  Administered 2017-07-05 (×3): 10 meq via INTRAVENOUS
  Filled 2017-07-05 (×3): qty 100

## 2017-07-05 MED ORDER — ADULT MULTIVITAMIN W/MINERALS CH
1.0000 | ORAL_TABLET | Freq: Every day | ORAL | Status: DC
  Administered 2017-07-07 – 2017-07-13 (×7): 1 via ORAL
  Filled 2017-07-05 (×9): qty 1

## 2017-07-05 MED ORDER — INSULIN ASPART 100 UNIT/ML ~~LOC~~ SOLN
0.0000 [IU] | Freq: Every day | SUBCUTANEOUS | Status: DC
  Administered 2017-07-07: 5 [IU] via SUBCUTANEOUS
  Administered 2017-07-08 – 2017-07-09 (×2): 2 [IU] via SUBCUTANEOUS
  Administered 2017-07-12: 3 [IU] via SUBCUTANEOUS

## 2017-07-05 MED ORDER — AMLODIPINE BESYLATE 10 MG PO TABS
10.0000 mg | ORAL_TABLET | Freq: Every day | ORAL | Status: DC
  Administered 2017-07-07 – 2017-07-13 (×7): 10 mg via ORAL
  Filled 2017-07-05 (×9): qty 1

## 2017-07-05 NOTE — Evaluation (Signed)
Occupational Therapy Evaluation Patient Details Name: Richard [name] MRN: [id number] DOB: [birth year] Today's Date: 07/05/2017    History of Present Illness This 72 y.o. male admitted with Rt sided weakness, difficulty speaking, confusion.   CT and CTA of head were unremarkable.  Pt had seizure like activity while in ED.  Dx:  complex partial seizure and ETOH withdrawal.  PMH:  CAD,  V-Fib Arrest, DM, Lt frontal hemorrhage with Rt hemiparesis.   Clinical Impression   .Pt admitted with above. He demonstrates the below listed deficits and will benefit from continued OT to maximize safety and independence with BADLs.  Pt presents to OT with significant cognitive deficits compounded by communication deficits.  He demonstrates Rt inattention/neglect while seated EOB.  He currently requires total A for ADLs and functional mobility.  He is currently on CIWA protoccol.  Per chart review, he was mod I with ADLs and functional mobility, and lives with spouse and son.   Feel he may benefit from CIR stay prior to discharge home.       Follow Up Recommendations  CIR;Supervision/Assistance - 24 hour    Equipment Recommendations  Other (comment)(TBD )    Recommendations for Other Services Rehab consult     Precautions / Restrictions Precautions Precautions: Fall      Mobility Bed Mobility Overal bed mobility: Needs Assistance Bed Mobility: Supine to Sit;Sit to Supine     Supine to sit: Total assist;+2 for physical assistance;+2 for safety/equipment Sit to supine: Max assist;+2 for physical assistance;+2 for safety/equipment   General bed mobility comments: Pt required assist for all aspects of moving to EOB.  He did initiate return to supine, scooting hips back on the bed and lowering trunk   Transfers                 General transfer comment: unable to assess     Balance Overall balance assessment: Needs assistance Sitting-balance support: Feet supported Sitting balance-Leahy  Scale: Poor Sitting balance - Comments: Pt fluctuates requiring max A to maintain EOB sitting, to periods of min guard assist                                   ADL either performed or assessed with clinical judgement   ADL Overall ADL's : Needs assistance/impaired Eating/Feeding: Total assistance   Grooming: Wash/dry face;Total assistance;Sitting Grooming Details (indicate cue type and reason): Pt did wipe face, but unable to complete task  Upper Body Bathing: Total assistance;Bed level;Sitting   Lower Body Bathing: Total assistance;Bed level   Upper Body Dressing : Total assistance;Bed level   Lower Body Dressing: Total assistance;Bed level   Toilet Transfer: Total assistance Toilet Transfer Details (indicate cue type and reason): unable to safely attempt  Toileting- Clothing Manipulation and Hygiene: Total assistance;Bed level       Functional mobility during ADLs: Total assistance;+2 for physical assistance General ADL Comments: Pt unable to sustain attention to participate in ADL tasks      Vision   Additional Comments: Pt with h/o Lt eye injury with prosthetic eye.  Lt eye closed with drainage from eye and lid matted shut.   Lid cleaned.  Iris of prosthesis postitioned with fully medial gaze, and lower portion of prosthesis did not appear to be in correct place.  RN and MD notified that prosthetic is not in correct location and appears to be causing signficant lid irritation - assisted RN with removal  of prosthesis.  Lt eye with Lt gaze preference       Perception Perception Perception Tested?: Yes Perception Deficits: Inattention/neglect Inattention/Neglect: Does not attend to right visual field Spatial deficits: Pt with Rt gaze preference    Praxis Praxis Praxis tested?: Not tested Praxis-Other Comments: unable to accurately assess     Pertinent Vitals/Pain Pain Assessment: Faces Pain Score: 0-No pain     Hand Dominance Right   Extremity/Trunk  Assessment Upper Extremity Assessment Upper Extremity Assessment: RUE deficits/detail RUE Deficits / Details: Per chart, pt with h/o Rt UE weakness and decreased FMC.  Unable to assess coordination due to impaired cognition    Lower Extremity Assessment Lower Extremity Assessment: Defer to PT evaluation   Cervical / Trunk Assessment Cervical / Trunk Assessment: Other exceptions Cervical / Trunk Exceptions: While seated, pt demonstrates signficant rotation of trunk to the Lt with head/neck postured to the Lt. Lt gaze preference.  With max cues/stimuli, he will turn to the Rt   Communication Communication Communication: Expressive difficulties(h/o aphasia )   Cognition Arousal/Alertness: Awake/alert;Lethargic Behavior During Therapy: Restless;Impulsive Overall Cognitive Status: Impaired/Different from baseline Area of Impairment: Attention;Following commands                   Current Attention Level: Focused   Following Commands: Follows one step commands inconsistently;Follows one step commands with increased time       General Comments: Pt with verbal perseveration.  He has h/o aphasia making accurate cognitive assessment difficult.  He was very resteless, followed one step commands ~15% of the time.   General Comments       Exercises     Shoulder Instructions      Home Living Family/patient expects to be discharged to:: Private residence Living Arrangements: Spouse/significant other;Children;Other relatives Available Help at Discharge: Family;Available 24 hours/day Type of Home: House Home Access: Stairs to enter Enter[employer] CorporationEntrance Stairs-Number of Steps: 3 small steps to enter  Entrance Stairs-Rails: Right Home Layout: Two level Alternate Level Stairs-Number of Steps: full flight  Alternate Level Stairs-Rails: Right;Left Bathroom Shower/Tub: Chief Strategy OfficerTub/shower unit   Bathroom Toilet: Standard Bathroom Accessibility: Yes       Additional Comments: all info gleaned from  previous admission notes.   Pt unable to provide info.       Prior Functioning/Environment          Comments: Per notes from OP rehab, pt was mod I with ambulation with no AD.  He had mild residual Rt sided weakness, and had aphasia, but was able to carry on moderately complex conversation         OT Problem List: Decreased strength;Decreased activity tolerance;Impaired balance (sitting and/or standing);Impaired vision/perception;Decreased coordination;Decreased cognition;Decreased safety awareness;Decreased knowledge of use of DME or AE;Impaired UE functional use      OT Treatment/Interventions: Self-care/ADL training;Neuromuscular education;DME and/or AE instruction;Therapeutic activities;Cognitive remediation/compensation;Visual/perceptual remediation/compensation;Patient/family education;Balance training    OT Goals(Current goals can be found in the care plan section) Acute Rehab OT Goals OT Goal Formulation: Patient unable to participate in goal setting Time For Goal Achievement: 07/19/17 Potential to Achieve Goals: Good ADL Goals Pt Will Perform Eating: with set-up;sitting Pt Will Perform Grooming: with min assist;standing Pt Will Perform Upper Body Bathing: with min assist;sitting Pt Will Perform Lower Body Bathing: with min assist;sit to/from stand Pt Will Perform Upper Body Dressing: with min assist;sitting Pt Will Perform Lower Body Dressing: with min assist;sit to/from stand Pt Will Transfer to Toilet: with min assist;ambulating;regular height toilet;bedside commode;grab bars Pt Will Perform Toileting -  Clothing Manipulation and hygiene: with min assist;sit to/from stand Additional ADL Goal #1: Pt will sustain attention to familiar ADL task x 4 mins with min cues  OT Frequency: Min 2X/week   Barriers to D/C:    no family present        Co-evaluation PT/OT/SLP Co-Evaluation/Treatment: Yes Reason for Co-Treatment: Complexity of the patient's impairments (multi-system  involvement);Necessary to address cognition/behavior during functional activity;For patient/therapist safety;To address functional/ADL transfers   OT goals addressed during session: ADL's and self-care;Strengthening/ROM      AM-PAC PT "6 Clicks" Daily Activity     Outcome Measure Help from another person eating meals?: Total Help from another person taking care of personal grooming?: Total Help from another person toileting, which includes using toliet, bedpan, or urinal?: Total Help from another person bathing (including washing, rinsing, drying)?: Total Help from another person to put on and taking off regular upper body clothing?: Total Help from another person to put on and taking off regular lower body clothing?: Total 6 Click Score: 6   End of Session Nurse Communication: Mobility status;Other (comment)(prosthetic issues )  Activity Tolerance: Patient tolerated treatment well Patient left: in bed;with call bell/phone within reach;with bed alarm set;with restraints reapplied  OT Visit Diagnosis: Cognitive communication deficit (Z61.096)                Time: 0454-0981 OT Time Calculation (min): 35 min Charges:  OT General Charges $OT Visit: 1 Visit OT Evaluation $OT Eval Moderate Complexity: 1 Mod G-Codes:     [employer], OTR/L [phone]   Jeani Hawking M 07/05/2017, 5:38 PM

## 2017-07-05 NOTE — Progress Notes (Signed)
PROGRESS NOTE    Richard [name]  NWG:[id number] DOB: 06/13/1945 DOA: 07/03/2017 PCP: Elias Elseeade, Robert, MD   Brief Narrative:  Richard [name] is a 72 y.o. 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 CT head and CTA head/neck were unremarkable and the patient had evident seizure-like activity while in the ED. Neurology was consulted, seizure terminated with ativan and keppra load. He is currently being treated for new complex partial seizure and EtOH withdrawal. CIWA was elevated and currently on SDU withdrawal protocol.   Assessment & Plan:   Principal Problem:   Seizures (HCC) Active Problems:   Essential hypertension   CAD (coronary artery disease)   Alcohol abuse   Acute encephalopathy   Seizure (HCC)  New-onset complex Partial Seizure  -Suspect this is in the setting of left parietal encephalomalacia and possible alcohol withdrawal. -Neurology following and appreciated reccs -EEG done and showed diffuse slowing of the waking background. Per Neuro Dr. Otelia LimesLindzen No electrographic seizures seen; however, the diffuse slowing may be a postictal phenomenon -Continue IV Keppra 750mg  BID following load; Neuro recommending it at D/C - Ativan prn seizure -Continue to Monitor neuro status in SDU -Soft restraints only as needed -Neuro recommending MRI of Brain so have ordered.  -Follow up with Neurology as an outpatient  -Recommendations per Neurology Dr. Otelia LimesLindzen   Alcohol Withdrawal:  -Continue CIWA; Score this AM was 10 -C/w Lorazepam 2-3 mg IV q1hprn EtOH withdrawal Based on CIWA Score -Continues to be Agitated at times and given IV Haloperidol 5 mg x1 early this AM  -C/w Folic Acid, MVI, and Thiamine   CAD status post stenting in the setting of ST elevation MI in March 2017 -Presently NPO; will get SLP  Evaluation -SLP recommending Dysphagia 3 Diet with Thin Liquids with Full Staff Supervision for use of Swallowing Precautions -Resume Home Medications of Atorvastatin 80 mg po Daily, Carvedilol 6.25 mg po BID and NTG 0.4 mg SL NTG -Continue to hold Lisinopril-HCTZ 20-12.5 mg po daily for now  Hypertension -Continue with IV Hydralazine 10 mg q4hprn for parameters of now SBP >180 or DBP >100 as restarting home Meds -Resume Home Carvedilol and Home Amlodipine 10 mg po daily  -Continue to hold Lisinopril-HCTZ 20-12.5 mg po daily for now  History of intracranial hemorrhage in the setting of marked hypertension.   -Resume Home BP medications as above -Resume Home Atorvastatin 80 mg po Daily   Diabetes Mellitus Type 2  -Patient takes NPH insulin and sugars were ranging in the lower range -Change Sensitive Novolog SSI q4h to Sensitive Novolog SSI AC/HS -CBG's ranging from 94-208; Continue to Monitor CBG's closely  -Checked Hemoglobin A1c and was 6.3  Hypokalemia  -Patient's K+ was 3.2 this AM -Replete with IV KCl 30 mEQ -Continue to Monitor and Replete as Necessary -Repeat CMP in AM   Leukocytosis -Mild and went from 9.8 -> 10.7 -Given Patient's Temperature last night will pan-culture -Obtain Blood Cx x2, Urinalysis, Urine Cx, and CXR  Normocytic Anemia -Patient's Hb/Hct went from 12.1/36.3 -> 11.8/36.3 -Continue to Monitor for S/Sx of Bleeding -Repeat CBC in AM   Hypomagnesemia -Patient's Mag Level was 1.5 -Replete with 2 grams of IV Mag Sulfate -Continue to Monitor and Replete as Necessary -Repeat Mag Level in AM   DVT prophylaxis: Enoxaparin 40 mg sq q24h Code Status: FULL CODE Family Communication: No family  present at bedside Disposition Plan: Remain Inpatient for further workup and evaluation given pending MRI  Consultants:   Neurology Dr. Otelia Limes   Procedures:  EEG  Description: The patient is awake but mostly drowsy during the recording.  During maximal  wakefulness, there is a symmetric, medium voltage 8 Hz posterior dominant rhythm that attenuates with eye opening. This is admixed with a mild amount of diffuse 4-5 Hz theta and 2-3 Hz delta slowing of the waking background.  During drowsiness, there is an increase in theta slowing of the background.  Stage 2 sleep not seen.  There were no epileptiform discharges or electrographic seizures seen.    EKG lead was unremarkable.  Impression: This awake and drowsy EEG is abnormal due to mild diffuse slowing of the waking background.  Clinical Correlation of the above findings indicates diffuse cerebral dysfunction that is non-specific in etiology and can be seen with hypoxic/ischemic injury, toxic/metabolic encephalopathies, neurodegenerative disorders, or medication effect.  The absence of epileptiform discharges does not rule out a clinical diagnosis of epilepsy.  Clinical correlation is advised.   Antimicrobials:  Anti-infectives (From admission, onward)   None     Subjective: Seen and examined at bedside and was still a little agitated. Per nursing her removed his IV's and clothes last night and had to receive Haldol. Continues to have difficulty speaking with dysarthria but able to track with eyes and ask "why" to questioning. CIWA this AM was 10 so nursing gave him IV Lorazepam per protocol.   Objective: Vitals:   07/04/17 1624 07/04/17 2006 07/04/17 2300 07/05/17 0300  BP: 121/78 (!) 138/102  (!) 143/79  Pulse: (!) 104 (!) 117 100 100  Resp:  (!) 25    Temp: 98.3 F (36.8 C) 97.7 F (36.5 C) (!) 101.1 F (38.4 C) 98.5 F (36.9 C)  TempSrc: Oral Oral Axillary Oral  SpO2: 96% 97% 98% 98%  Weight:      Height:        Intake/Output Summary (Last 24 hours) at 07/05/2017 0745 Last data filed at 07/05/2017 0300 Gross per 24 hour  Intake 871.67 ml  Output 1550 ml  Net -678.33 ml   Filed Weights   07/03/17 1957  Weight: 91.5 kg (201 lb 11.5 oz)   Examination: Physical  Exam:  Constitutional: AAM who is agitated and appears slightly uncomfortable Eyes: Lids normal. Has a false left eye   ENMT: External Ears, Nose appear normal. Grossly normal hearing.  Neck: Appears normal, supple, no cervical masses, normal ROM, no appreciable thyromegaly; no JVD Respiratory: Diminished to auscultation bilaterally, no wheezing, rales, rhonchi or crackles. Normal respiratory effort and patient is not tachypenic. No accessory muscle use.  Cardiovascular: Tachycardic Rate but regular rhythm , no murmurs / rubs / gallops. S1 and S2 auscultated. Abdomen: Soft, non-tender, non-distended. No masses palpated. No appreciable hepatosplenomegaly. Bowel sounds positive x4.  GU: Deferred. Musculoskeletal: No clubbing / cyanosis of digits/nails. No joint deformity upper and lower extremities.  Skin: No rashes, lesions, ulcers on a limited skin evaluation. No induration; Warm and dry.  Neurologic: Patient is dysarthric and independently moving all extremities.  Psychiatric: Impaired judgment and insight. Alert but not oriented. Agitated mood.  Data Reviewed: I have personally reviewed following labs and imaging studies  CBC: Recent Labs  Lab 07/03/17 2003 07/03/17 2010 07/04/17 0326  WBC 11.1*  --  9.8  NEUTROABS 7.1  --   --   HGB 13.5 14.6 12.1*  HCT 40.5 43.0 36.3*  MCV 91.0  --  90.1  PLT 227  --  209   Basic Metabolic Panel: Recent Labs  Lab 07/03/17 2003 07/03/17 2010 07/04/17 0326 07/05/17 0435  NA 135 135 135 137  K 3.9 4.3 3.2* 3.2*  CL 95* 97* 100* 101  CO2 27  --  27 19*  GLUCOSE 248* 246* 154* 180*  BUN 15 19 13 8   CREATININE 1.08 1.00 0.93  0.92 1.07  CALCIUM 9.8  --  9.1 8.5*  MG  --   --  1.8  --    GFR: Estimated Creatinine Clearance: 71.6 mL/min (by C-G formula based on SCr of 1.07 mg/dL). Liver Function Tests: Recent Labs  Lab 07/03/17 2003 07/04/17 0326  AST 19 22  ALT 17 14*  ALKPHOS 76 63  BILITOT 1.0 0.8  PROT 7.0 6.3*  ALBUMIN 4.0  3.4*   No results for input(s): LIPASE, AMYLASE in the last 168 hours. No results for input(s): AMMONIA in the last 168 hours. Coagulation Profile: Recent Labs  Lab 07/03/17 2003  INR 1.00   Cardiac Enzymes: Recent Labs  Lab 07/04/17 0657  TROPONINI <0.03   BNP (last 3 results) No results for input(s): PROBNP in the last 8760 hours. HbA1C: Recent Labs    07/05/17 0435  HGBA1C 6.3*   CBG: Recent Labs  Lab 07/04/17 1219 07/04/17 1706 07/04/17 2006 07/04/17 2336 07/05/17 0329  GLUCAP 96 175* 111* 100* 208*   Lipid Profile: No results for input(s): CHOL, HDL, LDLCALC, TRIG, CHOLHDL, LDLDIRECT in the last 72 hours. Thyroid Function Tests: No results for input(s): TSH, T4TOTAL, FREET4, T3FREE, THYROIDAB in the last 72 hours. Anemia Panel: No results for input(s): VITAMINB12, FOLATE, FERRITIN, TIBC, IRON, RETICCTPCT in the last 72 hours. Sepsis Labs: No results for input(s): PROCALCITON, LATICACIDVEN in the last 168 hours.  Recent Results (from the past 240 hour(s))  MRSA PCR Screening     Status: None   Collection Time: 07/03/17 11:08 PM  Result Value Ref Range Status   MRSA by PCR NEGATIVE NEGATIVE Final    Comment:        The GeneXpert MRSA Assay (FDA approved for NASAL specimens only), is one component of a comprehensive MRSA colonization surveillance program. It is not intended to diagnose MRSA infection nor to guide or monitor treatment for MRSA infections. Performed at [facility], [address], [place], [place] [address]     Radiology Studies: Ct Angio Head W Or Wo Contrast  Result Date: 07/03/2017 CLINICAL DATA:  Initial evaluation for acute stroke. EXAM: CT ANGIOGRAPHY HEAD AND NECK TECHNIQUE: Multidetector CT imaging of the head and neck was performed using the standard protocol during bolus administration of intravenous contrast. Multiplanar CT image reconstructions and MIPs were obtained to evaluate the vascular anatomy. Carotid stenosis  measurements (when applicable) are obtained utilizing NASCET criteria, using the distal internal carotid diameter as the denominator. Multiphase CT imaging of the brain was performed following IV bolus contrast injection. Subsequent parametric perfusion maps were calculated using RAPID software. CONTRAST:  50mL ISOVUE-370 IOPAMIDOL (ISOVUE-370) INJECTION 76% COMPARISON:  None. FINDINGS: CT HEAD FINDINGS Brain: Generalized age-related cerebral atrophy. Patchy and confluent hypodensity within the periventricular and deep white matter both cerebral hemispheres, most consistent with chronic small vessel ischemic disease, fairly advanced in nature. Encephalomalacia within the posterior left frontal region, consistent with remote infarct/hemorrhage as seen on prior CT. Additional area of encephalomalacia within the right frontal lobe also consistent with remote infarct, stable from previous. Small remote left occipital infarct noted.  Few small remote bilateral basal ganglia lacunar infarcts. No acute intracranial hemorrhage. No evidence for acute large vessel territory infarct. No mass lesion, midline shift or mass effect. No hydrocephalus. No extra-axial fluid collection. Vascular: No asymmetric hyperdense vessel. Calcified atherosclerosis present at the skull base. Skull: Scalp soft tissues within normal limits.  Calvarium intact. Sinuses/Orbits: Left ocular prosthesis noted. Globes and orbital soft tissues demonstrate no acute abnormality. Mild scattered mucosal thickening within the left sphenoid sinus. Paranasal sinuses are otherwise largely clear. No mastoid effusion. Other: None. ASPECTS (Alberta Stroke Program Early CT Score) - Ganglionic level infarction (caudate, lentiform nuclei, internal capsule, insula, M1-M3 cortex): 7 - Supraganglionic infarction (M4-M6 cortex): 3 Total score (0-10 with 10 being normal): 10 Review of the MIP images confirms the above findings Prior to the study being completed, the  examination was discontinued by the ordering provider. Scalp AP and lateral tomogram images only are provided. IMPRESSION: 1. No acute intracranial abnormality. 2. ASPECTS = 10. 3. Moderate cerebral atrophy with chronic small vessel ischemic disease with multifocal remote ischemic infarcts as above. 4. The CTA portion of this exam was discontinued by the ordering provider prior to the study being performed. Only topogram images are provided. No charges should be applied to the patient. Electronically Signed   By: Rise Mu M.D.   On: 07/03/2017 20:44   Ct Angio Head W Or Wo Contrast  Result Date: 07/03/2017 CLINICAL DATA:  Initial evaluation for acute stroke. EXAM: CT ANGIOGRAPHY HEAD AND NECK TECHNIQUE: Multidetector CT imaging of the head and neck was performed using the standard protocol during bolus administration of intravenous contrast. Multiplanar CT image reconstructions and MIPs were obtained to evaluate the vascular anatomy. Carotid stenosis measurements (when applicable) are obtained utilizing NASCET criteria, using the distal internal carotid diameter as the denominator. Multiphase CT imaging of the brain was performed following IV bolus contrast injection. Subsequent parametric perfusion maps were calculated using RAPID software. CONTRAST:  50mL ISOVUE-370 IOPAMIDOL (ISOVUE-370) INJECTION 76% COMPARISON:  None. FINDINGS: CT HEAD FINDINGS Brain: Generalized age-related cerebral atrophy. Patchy and confluent hypodensity within the periventricular and deep white matter both cerebral hemispheres, most consistent with chronic small vessel ischemic disease, fairly advanced in nature. Encephalomalacia within the posterior left frontal region, consistent with remote infarct/hemorrhage as seen on prior CT. Additional area of encephalomalacia within the right frontal lobe also consistent with remote infarct, stable from previous. Small remote left occipital infarct noted. Few small remote bilateral  basal ganglia lacunar infarcts. No acute intracranial hemorrhage. No evidence for acute large vessel territory infarct. No mass lesion, midline shift or mass effect. No hydrocephalus. No extra-axial fluid collection. Vascular: No asymmetric hyperdense vessel. Calcified atherosclerosis present at the skull base. Skull: Scalp soft tissues within normal limits.  Calvarium intact. Sinuses/Orbits: Left ocular prosthesis noted. Globes and orbital soft tissues demonstrate no acute abnormality. Mild scattered mucosal thickening within the left sphenoid sinus. Paranasal sinuses are otherwise largely clear. No mastoid effusion. Other: None. ASPECTS (Alberta Stroke Program Early CT Score) - Ganglionic level infarction (caudate, lentiform nuclei, internal capsule, insula, M1-M3 cortex): 7 - Supraganglionic infarction (M4-M6 cortex): 3 Total score (0-10 with 10 being normal): 10 Review of the MIP images confirms the above findings Prior to the study being completed, the examination was discontinued by the ordering provider. Scalp AP and lateral tomogram images only are provided. IMPRESSION: 1. No acute intracranial abnormality. 2. ASPECTS = 10. 3. Moderate cerebral atrophy with chronic small vessel ischemic disease with multifocal remote ischemic infarcts as  above. 4. The CTA portion of this exam was discontinued by the ordering provider prior to the study being performed. Only topogram images are provided. No charges should be applied to the patient. Electronically Signed   By: Rise Mu M.D.   On: 07/03/2017 20:44   Ct Angio Neck W Or Wo Contrast  Result Date: 07/03/2017 CLINICAL DATA:  Initial evaluation for acute stroke. EXAM: CT ANGIOGRAPHY HEAD AND NECK TECHNIQUE: Multidetector CT imaging of the head and neck was performed using the standard protocol during bolus administration of intravenous contrast. Multiplanar CT image reconstructions and MIPs were obtained to evaluate the vascular anatomy. Carotid  stenosis measurements (when applicable) are obtained utilizing NASCET criteria, using the distal internal carotid diameter as the denominator. Multiphase CT imaging of the brain was performed following IV bolus contrast injection. Subsequent parametric perfusion maps were calculated using RAPID software. CONTRAST:  50mL ISOVUE-370 IOPAMIDOL (ISOVUE-370) INJECTION 76% COMPARISON:  None. FINDINGS: CT HEAD FINDINGS Brain: Generalized age-related cerebral atrophy. Patchy and confluent hypodensity within the periventricular and deep white matter both cerebral hemispheres, most consistent with chronic small vessel ischemic disease, fairly advanced in nature. Encephalomalacia within the posterior left frontal region, consistent with remote infarct/hemorrhage as seen on prior CT. Additional area of encephalomalacia within the right frontal lobe also consistent with remote infarct, stable from previous. Small remote left occipital infarct noted. Few small remote bilateral basal ganglia lacunar infarcts. No acute intracranial hemorrhage. No evidence for acute large vessel territory infarct. No mass lesion, midline shift or mass effect. No hydrocephalus. No extra-axial fluid collection. Vascular: No asymmetric hyperdense vessel. Calcified atherosclerosis present at the skull base. Skull: Scalp soft tissues within normal limits.  Calvarium intact. Sinuses/Orbits: Left ocular prosthesis noted. Globes and orbital soft tissues demonstrate no acute abnormality. Mild scattered mucosal thickening within the left sphenoid sinus. Paranasal sinuses are otherwise largely clear. No mastoid effusion. Other: None. ASPECTS (Alberta Stroke Program Early CT Score) - Ganglionic level infarction (caudate, lentiform nuclei, internal capsule, insula, M1-M3 cortex): 7 - Supraganglionic infarction (M4-M6 cortex): 3 Total score (0-10 with 10 being normal): 10 Review of the MIP images confirms the above findings Prior to the study being completed, the  examination was discontinued by the ordering provider. Scalp AP and lateral tomogram images only are provided. IMPRESSION: 1. No acute intracranial abnormality. 2. ASPECTS = 10. 3. Moderate cerebral atrophy with chronic small vessel ischemic disease with multifocal remote ischemic infarcts as above. 4. The CTA portion of this exam was discontinued by the ordering provider prior to the study being performed. Only topogram images are provided. No charges should be applied to the patient. Electronically Signed   By: Rise Mu M.D.   On: 07/03/2017 20:44   Ct Head Code Stroke Wo Contrast  Result Date: 07/03/2017 CLINICAL DATA:  Initial evaluation for acute stroke. EXAM: CT ANGIOGRAPHY HEAD AND NECK TECHNIQUE: Multidetector CT imaging of the head and neck was performed using the standard protocol during bolus administration of intravenous contrast. Multiplanar CT image reconstructions and MIPs were obtained to evaluate the vascular anatomy. Carotid stenosis measurements (when applicable) are obtained utilizing NASCET criteria, using the distal internal carotid diameter as the denominator. Multiphase CT imaging of the brain was performed following IV bolus contrast injection. Subsequent parametric perfusion maps were calculated using RAPID software. CONTRAST:  50mL ISOVUE-370 IOPAMIDOL (ISOVUE-370) INJECTION 76% COMPARISON:  None. FINDINGS: CT HEAD FINDINGS Brain: Generalized age-related cerebral atrophy. Patchy and confluent hypodensity within the periventricular and deep white matter both cerebral hemispheres,  most consistent with chronic small vessel ischemic disease, fairly advanced in nature. Encephalomalacia within the posterior left frontal region, consistent with remote infarct/hemorrhage as seen on prior CT. Additional area of encephalomalacia within the right frontal lobe also consistent with remote infarct, stable from previous. Small remote left occipital infarct noted. Few small remote bilateral  basal ganglia lacunar infarcts. No acute intracranial hemorrhage. No evidence for acute large vessel territory infarct. No mass lesion, midline shift or mass effect. No hydrocephalus. No extra-axial fluid collection. Vascular: No asymmetric hyperdense vessel. Calcified atherosclerosis present at the skull base. Skull: Scalp soft tissues within normal limits.  Calvarium intact. Sinuses/Orbits: Left ocular prosthesis noted. Globes and orbital soft tissues demonstrate no acute abnormality. Mild scattered mucosal thickening within the left sphenoid sinus. Paranasal sinuses are otherwise largely clear. No mastoid effusion. Other: None. ASPECTS (Al[place]ta Stroke Program Early CT Score) - Ganglionic level infarction (caudate, lentiform nuclei, internal capsule, insula, M1-M3 cortex): 7 - Supraganglionic infarction (M4-M6 cortex): 3 Total score (0-10 with 10 being normal): 10 Review of the MIP images confirms the above findings Prior to the study being completed, the examination was discontinued by the ordering provider. Scalp AP and lateral tomogram images only are provided. IMPRESSION: 1. No acute intracranial abnormality. 2. ASPECTS = 10. 3. Moderate cerebral atrophy with chronic small vessel ischemic disease with multifocal remote ischemic infarcts as above. 4. The CTA portion of this exam was discontinued by the ordering provider prior to the study being performed. Only topogram images are provided. No charges should be applied to the patient. Electronically Signed   By: Rise Mu M.D.   On: 07/03/2017 20:44   Scheduled Meds: . enoxaparin (LOVENOX) injection  40 mg Subcutaneous Q24H  . folic acid  1 mg Intravenous Daily  . insulin aspart  0-9 Units Subcutaneous Q4H  . thiamine  100 mg Intravenous Daily   Continuous Infusions: . levETIRAcetam Stopped (07/05/17 0018)  . potassium chloride      LOS: 1 day   Merlene Laughter, DO [facility] Pager [phone]  If 7PM-7AM, please contact  night-coverage [email or web address] Password [email or web address] 07/05/2017, 7:45 AM

## 2017-07-05 NOTE — Evaluation (Signed)
Physical Therapy Evaluation Patient Details Name: Richard JakschLeon [name] MRN: [id number] DOB: [birth year] Today's Date: 07/05/2017   History of Present Illness  This 72 y.o. male admitted with Rt sided weakness, difficulty speaking, confusion.   CT and CTA of head were unremarkable.  Pt had seizure like activity while in ED.  Dx:  complex partial seizure and ETOH withdrawal.  PMH:  CAD,  V-Fib Arrest, DM, Lt frontal hemorrhage with Rt hemiparesis.  Clinical Impression  Pt was able to sit EOB and interact with PT/OT.  Per OP therapy notes in 2017, pt was at a mod I level with gait with some residual R sided weakness.  He is currently significantly more impaired than his presumed baseline.  We are recommending CIR level therapies at discharge.    Follow Up Recommendations CIR    Equipment Recommendations  Wheelchair cushion (measurements PT);Wheelchair (measurements PT)    Recommendations for Other Services Rehab consult     Precautions / Restrictions Precautions Precautions: Fall Precaution Comments: right side is weaker, R neglect      Mobility  Bed Mobility Overal bed mobility: Needs Assistance Bed Mobility: Supine to Sit;Sit to Supine     Supine to sit: Total assist;+2 for physical assistance;+2 for safety/equipment Sit to supine: Max assist;+2 for physical assistance;+2 for safety/equipment   General bed mobility comments: Pt required assist for all aspects of moving to EOB.  He did initiate return to supine, scooting hips back on the bed and lowering trunk   Transfers                 General transfer comment: unable to assess safely today                                    Balance Overall balance assessment: Needs assistance Sitting-balance support: Feet supported Sitting balance-Leahy Scale: Poor Sitting balance - Comments: Pt fluctuates requiring max A to maintain EOB sitting, to periods of min guard assist.  Significant visual/perceptual issues noted in  sitting as pt tried to reach for different things presented in his visual field.                                      Pertinent Vitals/Pain Pain Assessment: Faces Pain Score: 0-No pain    Home Living Family/patient expects to be discharged to:: Private residence Living Arrangements: Spouse/significant other;Children;Other relatives Available Help at Discharge: Family;Available 24 hours/day Type of Home: House Home Access: Stairs to enter Entrance Stairs-Rails: Right Entrance Stairs-Number of Steps: 3 small steps to enter  Home Layout: Two level   Additional Comments: all info gleaned from previous admission notes.   Pt unable to provide info.     Prior Function           Comments: Per notes from OP rehab (2017), pt was mod I with ambulation with no AD.  He had mild residual Rt sided weakness, and had aphasia, but was able to carry on moderately complex conversation      Hand Dominance   Dominant Hand: Right    Extremity/Trunk Assessment   Upper Extremity Assessment Upper Extremity Assessment: Defer to OT evaluation    Lower Extremity Assessment Lower Extremity Assessment: RLE deficits/detail;LLE deficits/detail RLE Deficits / Details: Pt is actively moving both legs, but not to command. Generalized increased tone throughout LEs and trunk.  LLE Deficits / Details: Pt is actively moving both legs, but not to command. Generalized increased tone throughout LEs and trunk.     Cervical / Trunk Assessment Cervical / Trunk Assessment: Other exceptions Cervical / Trunk Exceptions: While seated, pt demonstrates signficant rotation of trunk to the Lt with head/neck postured to the Lt. Lt gaze preference.  With max cues/stimuli, he will turn to the Rt  Communication   Communication: Expressive difficulties(h/o aphasia )  Cognition Arousal/Alertness: Awake/alert;Lethargic Behavior During Therapy: Restless;Impulsive Overall Cognitive Status: Impaired/Different  from baseline Area of Impairment: Attention;Following commands                   Current Attention Level: Focused   Following Commands: Follows one step commands inconsistently;Follows one step commands with increased time       General Comments: Pt with verbal perseveration.  He has h/o aphasia making accurate cognitive assessment difficult.  He was very resteless, followed one step commands ~15% of the time.  He was very easily distracted by lines and tubes attached to him.       General Comments General comments (skin integrity, edema, etc.): VSS throughout sessio.         Assessment/Plan    PT Assessment Patient needs continued PT services  PT Problem List Decreased strength;Decreased balance;Decreased activity tolerance;Decreased coordination;Decreased mobility;Decreased cognition;Decreased knowledge of use of DME;Decreased safety awareness;Decreased knowledge of precautions;Impaired tone       PT Treatment Interventions DME instruction;Gait training;Functional mobility training;Therapeutic activities;Stair training;Therapeutic exercise;Balance training;Neuromuscular re-education;Cognitive remediation;Patient/family education    PT Goals (Current goals can be found in the Care Plan section)  Acute Rehab PT Goals Patient Stated Goal: unable to state PT Goal Formulation: Patient unable to participate in goal setting Time For Goal Achievement: 07/19/17 Potential to Achieve Goals: Good    Frequency Min 3X/week        Co-evaluation PT/OT/SLP Co-Evaluation/Treatment: Yes Reason for Co-Treatment: Complexity of the patient's impairments (multi-system involvement);Necessary to address cognition/behavior during functional activity;For patient/therapist safety PT goals addressed during session: Mobility/safety with mobility;Balance;Strengthening/ROM         AM-PAC PT "6 Clicks" Daily Activity  Outcome Measure Difficulty turning over in bed (including adjusting  bedclothes, sheets and blankets)?: Unable Difficulty moving from lying on back to sitting on the side of the bed? : Unable Difficulty sitting down on and standing up from a chair with arms (e.g., wheelchair, bedside commode, etc,.)?: Unable Help needed moving to and from a bed to chair (including a wheelchair)?: Total Help needed walking in hospital room?: Total Help needed climbing 3-5 steps with a railing? : Total 6 Click Score: 6    End of Session   Activity Tolerance: Patient tolerated treatment well Patient left: in bed;with call bell/phone within reach;with bed alarm set;with restraints reapplied   PT Visit Diagnosis: Unsteadiness on feet (R26.81);Muscle weakness (generalized) (M62.81);Other symptoms and signs involving the nervous system (Z61.096)    Time: 0454-0981 PT Time Calculation (min) (ACUTE ONLY): 45 min   Charges:          Lurena Joiner B. Murphy Bundick, PT, DPT [phone]   PT Evaluation $PT Eval Moderate Complexity: 1 Mod PT Treatments $Therapeutic Activity: 8-22 mins   07/05/2017, 11:05 PM

## 2017-07-06 ENCOUNTER — Encounter (HOSPITAL_COMMUNITY): Payer: Self-pay | Admitting: Physical Medicine and Rehabilitation

## 2017-07-06 LAB — COMPREHENSIVE METABOLIC PANEL
ALBUMIN: 3.5 g/dL (ref 3.5–5.0)
ALT: 14 U/L — ABNORMAL LOW (ref 17–63)
ANION GAP: 13 (ref 5–15)
AST: 29 U/L (ref 15–41)
Alkaline Phosphatase: 60 U/L (ref 38–126)
BUN: 9 mg/dL (ref 6–20)
CO2: 19 mmol/L — ABNORMAL LOW (ref 22–32)
Calcium: 8.6 mg/dL — ABNORMAL LOW (ref 8.9–10.3)
Chloride: 107 mmol/L (ref 101–111)
Creatinine, Ser: 0.98 mg/dL (ref 0.61–1.24)
Glucose, Bld: 169 mg/dL — ABNORMAL HIGH (ref 65–99)
POTASSIUM: 3.7 mmol/L (ref 3.5–5.1)
Sodium: 139 mmol/L (ref 135–145)
TOTAL PROTEIN: 6.5 g/dL (ref 6.5–8.1)
Total Bilirubin: 2.1 mg/dL — ABNORMAL HIGH (ref 0.3–1.2)

## 2017-07-06 LAB — URINALYSIS, ROUTINE W REFLEX MICROSCOPIC
Bacteria, UA: NONE SEEN
Bilirubin Urine: NEGATIVE
Glucose, UA: 150 mg/dL — AB
Hgb urine dipstick: NEGATIVE
Ketones, ur: 80 mg/dL — AB
Leukocytes, UA: NEGATIVE
Nitrite: NEGATIVE
PH: 5 (ref 5.0–8.0)
Protein, ur: 30 mg/dL — AB
SQUAMOUS EPITHELIAL / LPF: NONE SEEN
Specific Gravity, Urine: 1.026 (ref 1.005–1.030)

## 2017-07-06 LAB — GLUCOSE, CAPILLARY
GLUCOSE-CAPILLARY: 162 mg/dL — AB (ref 65–99)
GLUCOSE-CAPILLARY: 176 mg/dL — AB (ref 65–99)
GLUCOSE-CAPILLARY: 176 mg/dL — AB (ref 65–99)
GLUCOSE-CAPILLARY: 191 mg/dL — AB (ref 65–99)
GLUCOSE-CAPILLARY: 196 mg/dL — AB (ref 65–99)
GLUCOSE-CAPILLARY: 232 mg/dL — AB (ref 65–99)

## 2017-07-06 LAB — CBC WITH DIFFERENTIAL/PLATELET
BASOS PCT: 0 %
Basophils Absolute: 0 10*3/uL (ref 0.0–0.1)
Eosinophils Absolute: 0.4 10*3/uL (ref 0.0–0.7)
Eosinophils Relative: 5 %
HCT: 37.3 % — ABNORMAL LOW (ref 39.0–52.0)
Hemoglobin: 12.2 g/dL — ABNORMAL LOW (ref 13.0–17.0)
LYMPHS ABS: 1.5 10*3/uL (ref 0.7–4.0)
Lymphocytes Relative: 17 %
MCH: 30.3 pg (ref 26.0–34.0)
MCHC: 32.7 g/dL (ref 30.0–36.0)
MCV: 92.6 fL (ref 78.0–100.0)
MONO ABS: 0.7 10*3/uL (ref 0.1–1.0)
MONOS PCT: 7 %
NEUTROS ABS: 6.6 10*3/uL (ref 1.7–7.7)
Neutrophils Relative %: 71 %
Platelets: 208 10*3/uL (ref 150–400)
RBC: 4.03 MIL/uL — ABNORMAL LOW (ref 4.22–5.81)
RDW: 12.7 % (ref 11.5–15.5)
WBC: 9.2 10*3/uL (ref 4.0–10.5)

## 2017-07-06 LAB — RAPID URINE DRUG SCREEN, HOSP PERFORMED
AMPHETAMINES: NOT DETECTED
BENZODIAZEPINES: POSITIVE — AB
Barbiturates: NOT DETECTED
COCAINE: NOT DETECTED
Opiates: NOT DETECTED
Tetrahydrocannabinol: NOT DETECTED

## 2017-07-06 LAB — MAGNESIUM: MAGNESIUM: 2.1 mg/dL (ref 1.7–2.4)

## 2017-07-06 LAB — PHOSPHORUS: PHOSPHORUS: 2.2 mg/dL — AB (ref 2.5–4.6)

## 2017-07-06 MED ORDER — POTASSIUM PHOSPHATES 15 MMOLE/5ML IV SOLN
20.0000 mmol | Freq: Once | INTRAVENOUS | Status: AC
  Administered 2017-07-06: 20 mmol via INTRAVENOUS
  Filled 2017-07-06: qty 6.67

## 2017-07-06 MED ORDER — GUAIFENESIN ER 600 MG PO TB12
1200.0000 mg | ORAL_TABLET | Freq: Two times a day (BID) | ORAL | Status: DC
  Administered 2017-07-06 – 2017-07-13 (×13): 1200 mg via ORAL
  Filled 2017-07-06 (×13): qty 2

## 2017-07-06 NOTE — Progress Notes (Signed)
SLP Cancellation Note  Patient Details Name: Richard JakschLeon [name] MRN: [id number] DOB: 1945-12-13   Cancelled treatment:       Reason Eval/Treat Not Completed: Patient's level of consciousness. Visited pt at breakfast, pt could not be easily aroused for meal. Will try again later if possible.    Alfredia Desanctis, Riley NearingBonnie Caroline 07/06/2017, 9:04 AM

## 2017-07-06 NOTE — Progress Notes (Signed)
Inpatient Rehabilitation  Please see consult by Dr.Swartz for full details.  Will plan to follow for timing of medical readiness and post acute rehab needs.  Call if questions.  Charlane FerrettiMelissa Thorsten Climer, M.A., CCC/SLP Admission Coordinator  [facility]  Cell [phone]

## 2017-07-06 NOTE — Consult Note (Signed)
Physical Medicine and Rehabilitation Consult   Reason for Consult: Seizures with worsening of right sided weakness and cognitive deficits.  Referring Physician: Dr. Marland Mcalpine   HPI: Richard [name] is a 72 y.o. male with history of CAD, T2DM, VF, alcohol abuse, left frontal ICH with mild R-HP and aphasia (CIR stay 2016) who was admitted on 07/03/17 after being found with right sided weakness, confusion and difficulty speaking. CTA head/neck showed moderate cerebral atrophy with multifocal remote ischemic infarcts and no acute changes. Patient had 2 episodes of generalized seizures while in ED. He was treated with ativan and loaded with Keppra. EEG done revealing mild diffuse slowing question post ictal phenomenon. Neurology recommended MRI for work up.   Therapy evaluations done yesterday and patient with perseverative behaviors with distraction and impulsivity, difficulty following one step commands, generalized tone. He has had issues with agitation requiring sedation as well as restraints for safety. CIR recommended due to functional deficits.  Per son: patient was independent PTA--used cane for mobility at times and could perform ADLs independently. His aphasia was improving and he could express basic needs.    Per son:  Review of Systems  Unable to perform ROS: Patient unresponsive  HENT: Negative for hearing loss.   Musculoskeletal: Negative for myalgias.  Neurological: Positive for speech change and focal weakness.     Past Medical History:  Diagnosis Date  . CAD (coronary artery disease)    a. 05/2015 Inflat STEMI/PCI: LM nl, LAD 50p, Om1 100 (3.0 x 16mm Promus Premier DES), OM2/3 small, RCA 40p/d, EF 45-50%.  . Diabetes mellitus without complication (HCC)   . Diastolic dysfunction    a. 11/2014 Echo: EF 60-65%, gr1 DD, mildly dil RA.  . ETOH abuse   . H/O ventricular fibrillation    a. 05/2015 in setting of inferolateral STEMI.  . Hypertensive heart disease   . Intracranial  hemorrhage (HCC)    a. 11/2014 in setting of marked HTN->3.5x2.5 left frontal ICH (12-23ml by MRI); b. 11/2014 MRA no evidence of AVM or CAA; c. 11/2014 Carotid U/S: unremarkable.  . 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 ST elevation MI (STEMI) (HCC) 06/12/15  . Stroke [facility])     Past Surgical History:  Procedure Laterality Date  . CORONARY ANGIOPLASTY    . LEFT HEART CATH AND CORONARY ANGIOGRAPHY N/A 06/12/2015   Procedure: Left Heart Cath and Coronary Angiography;  Surgeon: Lyn Records, MD;  Location: [facility] INVASIVE CV LAB;  Service: Cardiovascular;  Laterality: N/A;    Family History  Problem Relation Age of Onset  . Stroke Maternal Grandmother   . Healthy Mother   . Other Father        hx unknown    Social History:  Married. Retired Airline pilot. He was independent PTA. Per reports that he has never smoked. He has never used smokeless tobacco. Per reports that he drinks Vodka daily? He does not use drugs.    Allergies: No Known Allergies    Medications Prior to Admission  Medication Sig Dispense Refill  . amLODipine (NORVASC) 10 MG tablet Take 1 tablet (10 mg total) by mouth daily. 30 tablet 1  . atorvastatin (LIPITOR) 40 MG tablet Take 80 mg by mouth daily.    . carvedilol (COREG) 6.25 MG tablet Take 1 tablet (6.25 mg total) by mouth 2 (two) times daily with a meal. 60 tablet 6  . insulin NPH Human (HUMULIN N,NOVOLIN N) 100 UNIT/ML  injection Inject 30-40 Units into the skin See admin instructions. Use 40 units every morning and use 30 units every evening    . lisinopril-hydrochlorothiazide (PRINZIDE,ZESTORETIC) 20-12.5 MG tablet Take 1 tablet by mouth daily.    . nitroGLYCERIN (NITROSTAT) 0.4 MG SL tablet Place 1 tablet (0.4 mg total) under the tongue every 5 (five) minutes as needed for chest pain. 25 tablet 3  . Multiple Vitamin (MULTIVITAMIN WITH MINERALS) TABS tablet Take 1 tablet by mouth daily. (Patient not taking: Reported on 07/03/2017)      Home: Home  Living Family/patient expects to be discharged to:: Private residence Living Arrangements: Spouse/significant other, Children, Other relatives Available Help at Discharge: Family, Available 24 hours/day Type of Home: House Home Access: Stairs to enter Entergy CorporationEntrance Stairs-Number of Steps: 3 small steps to enter  Entrance Stairs-Rails: Right Home Layout: Two level Alternate Level Stairs-Number of Steps: full flight  Alternate Level Stairs-Rails: Right, Left Bathroom Shower/Tub: Engineer, manufacturing systemsTub/shower unit Bathroom Toilet: Standard Bathroom Accessibility: Yes Additional Comments: all info gleaned from previous admission notes.   Pt unable to provide info.   Functional History: Prior Function Comments: Per notes from OP rehab (2017), pt was mod I with ambulation with no AD.  He had mild residual Rt sided weakness, and had aphasia, but was able to carry on moderately complex conversation  Functional Status:  Mobility: Bed Mobility Overal bed mobility: Needs Assistance Bed Mobility: Supine to Sit, Sit to Supine Supine to sit: Total assist, +2 for physical assistance, +2 for safety/equipment Sit to supine: Max assist, +2 for physical assistance, +2 for safety/equipment General bed mobility comments: Pt required assist for all aspects of moving to EOB.  He did initiate return to supine, scooting hips back on the bed and lowering trunk  Transfers General transfer comment: unable to assess safely today      ADL: ADL Overall ADL's : Needs assistance/impaired Eating/Feeding: Total assistance Grooming: Wash/dry face, Total assistance, Sitting Grooming Details (indicate cue type and reason): Pt did wipe face, but unable to complete task  Upper Body Bathing: Total assistance, Bed level, Sitting Lower Body Bathing: Total assistance, Bed level Upper Body Dressing : Total assistance, Bed level Lower Body Dressing: Total assistance, Bed level Toilet Transfer: Total assistance Toilet Transfer Details (indicate  cue type and reason): unable to safely attempt  Toileting- Clothing Manipulation and Hygiene: Total assistance, Bed level Functional mobility during ADLs: Total assistance, +2 for physical assistance General ADL Comments: Pt unable to sustain attention to participate in ADL tasks   Cognition: Cognition Overall Cognitive Status: Impaired/Different from baseline Arousal/Alertness: Awake/alert Orientation Level: Oriented to person Attention: Focused Focused Attention: Impaired Focused Attention Impairment: Functional basic, Verbal basic Behaviors: Perseveration, Restless Safety/Judgment: Impaired Cognition Arousal/Alertness: Awake/alert, Lethargic Behavior During Therapy: Restless, Impulsive Overall Cognitive Status: Impaired/Different from baseline Area of Impairment: Attention, Following commands Current Attention Level: Focused Following Commands: Follows one step commands inconsistently, Follows one step commands with increased time General Comments: Pt with verbal perseveration.  He has h/o aphasia making accurate cognitive assessment difficult.  He was very resteless, followed one step commands ~15% of the time.  He was very easily distracted by lines and tubes attached to him.    Blood pressure (!) 159/100, pulse 78, temperature 98.1 F (36.7 C), temperature source Axillary, resp. rate 14, height 6\' 1"  (1.854 m), weight 91.5 kg (201 lb 11.5 oz), SpO2 100 %. Physical Exam  Nursing note and vitals reviewed. Constitutional: He appears well-developed and well-nourished. No distress. He is restrained.  Sedated  and snoring with rhonchus sounds. Withdraws to pain. Bilateral wrist restraints and abdominal binder in place. Limited exam due to sedation.  HENT:  Head: Normocephalic and atraumatic.  Eyes:  Crusted eye lids.   Cardiovascular: Normal rate and regular rhythm.  Respiratory: Effort normal and breath sounds normal.  GI: Soft. Bowel sounds are normal. He exhibits no distension.  There is no tenderness.  Neurological: He is unresponsive.  Pt lethargic, cannot arouse. Does withdrawal locally to pain stimulation.   Skin: Skin is warm and dry. He is not diaphoretic.  Psychiatric:  obtunded    Results for orders placed or performed during the hospital encounter of 07/03/17 (from the past 24 hour(s))  Glucose, capillary     Status: Abnormal   Collection Time: 07/05/17 12:03 PM  Result Value Ref Range   Glucose-Capillary 156 (H) 65 - 99 mg/dL  Glucose, capillary     Status: Abnormal   Collection Time: 07/05/17  5:28 PM  Result Value Ref Range   Glucose-Capillary 218 (H) 65 - 99 mg/dL  Glucose, capillary     Status: Abnormal   Collection Time: 07/05/17  8:59 PM  Result Value Ref Range   Glucose-Capillary 149 (H) 65 - 99 mg/dL  Glucose, capillary     Status: Abnormal   Collection Time: 07/06/17 12:31 AM  Result Value Ref Range   Glucose-Capillary 176 (H) 65 - 99 mg/dL  Glucose, capillary     Status: Abnormal   Collection Time: 07/06/17  4:58 AM  Result Value Ref Range   Glucose-Capillary 176 (H) 65 - 99 mg/dL  CBC with Differential/Platelet     Status: Abnormal   Collection Time: 07/06/17  5:38 AM  Result Value Ref Range   WBC 9.2 4.0 - 10.5 K/uL   RBC 4.03 (L) 4.22 - 5.81 MIL/uL   Hemoglobin 12.2 (L) 13.0 - 17.0 g/dL   HCT 40.9 (L) 81.1 - 91.4 %   MCV 92.6 78.0 - 100.0 fL   MCH 30.3 26.0 - 34.0 pg   MCHC 32.7 30.0 - 36.0 g/dL   RDW 78.2 95.6 - 21.3 %   Platelets 208 150 - 400 K/uL   Neutrophils Relative % 71 %   Neutro Abs 6.6 1.7 - 7.7 K/uL   Lymphocytes Relative 17 %   Lymphs Abs 1.5 0.7 - 4.0 K/uL   Monocytes Relative 7 %   Monocytes Absolute 0.7 0.1 - 1.0 K/uL   Eosinophils Relative 5 %   Eosinophils Absolute 0.4 0.0 - 0.7 K/uL   Basophils Relative 0 %   Basophils Absolute 0.0 0.0 - 0.1 K/uL  Comprehensive metabolic panel     Status: Abnormal   Collection Time: 07/06/17  5:38 AM  Result Value Ref Range   Sodium 139 135 - 145 mmol/L    Potassium 3.7 3.5 - 5.1 mmol/L   Chloride 107 101 - 111 mmol/L   CO2 19 (L) 22 - 32 mmol/L   Glucose, Bld 169 (H) 65 - 99 mg/dL   BUN 9 6 - 20 mg/dL   Creatinine, Ser 0.86 0.61 - 1.24 mg/dL   Calcium 8.6 (L) 8.9 - 10.3 mg/dL   Total Protein 6.5 6.5 - 8.1 g/dL   Albumin 3.5 3.5 - 5.0 g/dL   AST 29 15 - 41 U/L   ALT 14 (L) 17 - 63 U/L   Alkaline Phosphatase 60 38 - 126 U/L   Total Bilirubin 2.1 (H) 0.3 - 1.2 mg/dL   GFR calc non Af Amer >60 >60  mL/min   GFR calc Af Amer >60 >60 mL/min   Anion gap 13 5 - 15  Magnesium     Status: None   Collection Time: 07/06/17  5:38 AM  Result Value Ref Range   Magnesium 2.1 1.7 - 2.4 mg/dL  Phosphorus     Status: Abnormal   Collection Time: 07/06/17  5:38 AM  Result Value Ref Range   Phosphorus 2.2 (L) 2.5 - 4.6 mg/dL  Glucose, capillary     Status: Abnormal   Collection Time: 07/06/17  8:11 AM  Result Value Ref Range   Glucose-Capillary 162 (H) 65 - 99 mg/dL   Comment 1 Notify RN    Dg Chest Port 1 View  Result Date: 07/05/2017 CLINICAL DATA:  Fever EXAM: PORTABLE CHEST 1 VIEW COMPARISON:  Chest x-ray dated 12/14/2014. FINDINGS: Heart size and mediastinal contours appear stable. Lungs are clear. No pleural effusion or pneumothorax seen. Osseous structures about the chest are unremarkable. IMPRESSION: No active disease.  No evidence of pneumonia or pulmonary edema. Electronically Signed   By: Bary Richard M.D.   On: 07/05/2017 14:18    Assessment/Plan: Diagnosis: functional and cognitive deficits after seizure. Hx of left frontal ICH, work up ongoing 1. Does the need for close, 24 hr/day medical supervision in concert with the patient's rehab needs make it unreasonable for this patient to be served in a less intensive setting? Potentially 2. Co-Morbidities requiring supervision/potential complications: cad, seizure, htn,  3. Due to bladder management, bowel management, safety, skin/wound care, disease management, medication administration,  pain management and patient education, does the patient require 24 hr/day rehab nursing? Potentially 4. Does the patient require coordinated care of a physician, rehab nurse, PT (1-2 hrs/day, 5 days/week), OT (1-2 hrs/day, 5 days/week) and SLP (1-2 hrs/day, 5 days/week) to address physical and functional deficits in the context of the above medical diagnosis(es)? Potentially Addressing deficits in the following areas: balance, endurance, locomotion, strength, transferring, bowel/bladder control, bathing, dressing, feeding, grooming, toileting, cognition, speech, language, swallowing and psychosocial support 5. Can the patient actively participate in an intensive therapy program of at least 3 hrs of therapy per day at least 5 days per week? Potentially 6. The potential for patient to make measurable gains while on inpatient rehab is TBD 7. Anticipated functional outcomes upon discharge from inpatient rehab are TBD  with PT, TBD with OT, TBD with SLP. 8. Estimated rehab length of stay to reach the above functional goals is: TBD 9. Anticipated D/C setting: Home 10. Anticipated post D/C treatments: HH therapy and Outpatient therapy 11. Overall Rehab/Functional Prognosis: fair  RECOMMENDATIONS: This patient's condition is appropriate for continued rehabilitative care in the following setting: potentially CIR , pending diagnostic work up and increased level of arousal/ability to participate with therapy.   Patient has agreed to participate in recommended program. N/A Note that insurance prior authorization may be required for reimbursement for recommended care.  Comment: Rehab Admissions Coordinator to follow up.  Thanks,  Ranelle Oyster, MD, [place] [facility]    Jacquelynn Cree, PA-C 07/06/2017

## 2017-07-06 NOTE — Progress Notes (Signed)
PROGRESS NOTE    Richard [name]  ZOX:[id number] DOB: Nov 23, 1945 DOA: 07/03/2017 PCP: Elias Else, MD   Brief Narrative:  Richard [name] is a 72 y.o. 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 CT head and CTA head/neck were unremarkable and the patient had evident seizure-like activity while in the ED. Neurology was consulted, seizure terminated with ativan and keppra load. He is currently being treated for new complex partial seizure and EtOH withdrawal. CIWA was elevated and currently on SDU withdrawal protocol.   This AM patient had received Ativan for a CIWA score of 15 and was somnolent afterwards and could not participate in subjective History. Restraints were removed and MRI still pending to be done.   Assessment & Plan:   Principal Problem:   Seizures (HCC) Active Problems:   Essential hypertension   CAD (coronary artery disease)   Alcohol abuse   Acute encephalopathy   Seizure (HCC)  New-onset complex Partial Seizure  -Suspect this is in the setting of left parietal encephalomalacia and possible alcohol withdrawal. -Neurology following and appreciated reccs -EEG done and showed diffuse slowing of the waking background. Per Neuro Dr. Otelia Limes No electrographic seizures seen; however, the diffuse slowing may be a postictal phenomenon -Continue IV Keppra 750mg  BID following load; Neuro recommending it at D/C  Ativan prn seizure -Continue to Monitor neuro status in SDU -Soft restraints only as needed -Neuro recommending MRI of Brain so have ordered and still pending to be done; Nursing followed up with MRI and he is on the list  -Follow up with Neurology as an outpatient  -Recommendations per Neurology Dr. Otelia Limes  -PT/OT evaluated and recommending CIR; CIR MD evaluated and felt he is a candidate for  Rehab  Alcohol Withdrawal:  -Continue CIWA; Score this AM was 15 -C/w Lorazepam 2-3 mg IV q1hprn EtOH withdrawal Based on CIWA Score -Continues to be Agitated at times per Nursing but now Somnolent -Soft Restraints removed -C/w Folic Acid, MVI, and Thiamine   CAD status post stenting in the setting of ST elevation MI in March 2017 -SLP recommending Dysphagia 3 Diet with Thin Liquids with Full Staff Supervision for use of Swallowing Precautions -Resume Home Medications of Atorvastatin 80 mg po Daily, Carvedilol 6.25 mg po BID and NTG 0.4 mg SL NTG -Continue to hold Lisinopril-HCTZ 20-12.5 mg po daily for now   Hypertension -BP was 159/100 this AM but now improved to 130/75 -Continue with IV Hydralazine 10 mg q4hprn for parameters of now SBP >180 or DBP >100 as restarting home Meds -Resume Home Carvedilol and Home Amlodipine 10 mg po daily  -Continue to hold Lisinopril-HCTZ 20-12.5 mg po daily for now  History of intracranial hemorrhage in the setting of marked hypertension.   -Resume Home BP medications as above -Resume Home Atorvastatin 80 mg po Daily   Diabetes Mellitus Type 2  -Patient takes NPH insulin and sugars were ranging in the lower range -Change Sensitive Novolog SSI q4h to Sensitive Novolog SSI AC/HS -CBG's ranging from 162-232; Continue to Monitor CBG's closely  -Checked Hemoglobin A1c and was 6.3  Hypokalemia  -Patient's K+ was 3.2 yesterday AM and improved to 3.7 -Replete with IV KCl 30 mEQ yesterday  -Continue to Monitor and Replete as Necessary -Repeat CMP in AM   Leukocytosis, improved -Mild and went from 9.8 -> 10.7 -> 9.2 -Given Patient's Temperature  the night before last was pan-cultured -Blood Cx x2 pending -Urinalysis never collected but will re-order, Urine Cx never sent,  -CXR showed No active disease.  No evidence of pneumonia or pulmonary edema.  Normocytic Anemia -Patient's Hb/Hct went from 12.1/36.3 -> 11.8/36.3 -> 12.2/37.3 -Continue to  Monitor for S/Sx of Bleeding -Repeat CBC in AM   Hypomagnesemia -Patient's Mag Level was 1.5 and improved to 2.1 -Replete with 2 grams of IV Mag Sulfate yesterday  -Continue to Monitor and Replete as Necessary -Repeat Mag Level in AM   Hypophosphatemia  -Patient's Phos was 2.2 -Replete with IV KPHos 20 mmol -Continue to Monitor and Replete as Necessary -Repeat Phos Level in AM   Hyperbilirubinemia  -T Bili was 2.1 this AM and elevated from admission of 1.0 -Continue to Monitor  -Repeat CMP in AM and if not improving will check LDH and RUQ U/S  DVT prophylaxis: Enoxaparin 40 mg sq q24h Code Status: FULL CODE Family Communication: No family present at bedside Disposition Plan: Remain Inpatient for further workup and evaluation given pending MRI and anticipate D/C to CIR in next 24-48 hours if stable   Consultants:   Neurology Dr. Otelia LimesLindzen  Inpatient Rehab Dr. Riley KillSwartz   Procedures:  EEG  Description: The patient is awake but mostly drowsy during the recording.  During maximal wakefulness, there is a symmetric, medium voltage 8 Hz posterior dominant rhythm that attenuates with eye opening. This is admixed with a mild amount of diffuse 4-5 Hz theta and 2-3 Hz delta slowing of the waking background.  During drowsiness, there is an increase in theta slowing of the background.  Stage 2 sleep not seen.  There were no epileptiform discharges or electrographic seizures seen.    EKG lead was unremarkable.  Impression: This awake and drowsy EEG is abnormal due to mild diffuse slowing of the waking background.  Clinical Correlation of the above findings indicates diffuse cerebral dysfunction that is non-specific in etiology and can be seen with hypoxic/ischemic injury, toxic/metabolic encephalopathies, neurodegenerative disorders, or medication effect.  The absence of epileptiform discharges does not rule out a clinical diagnosis of epilepsy.  Clinical correlation is  advised.   Antimicrobials:  Anti-infectives (From admission, onward)   None     Subjective: Seen and examined at bedside this AM and was extremely somnolent after receiving Ativan. Barely opened eyes to physical stimuli and was snoring. Nursing reports CIWA was 15 early this AM. No other concerns or complaints at this time.   Objective: Vitals:   07/06/17 0037 07/06/17 0045 07/06/17 0501 07/06/17 0747  BP: (!) 180/96 (!) 175/96 (!) 137/91 (!) 159/100  Pulse: 83 82 77 78  Resp:   14   Temp: 98.2 F (36.8 C)  98.2 F (36.8 C) 98.1 F (36.7 C)  TempSrc: Oral Oral Oral Axillary  SpO2: 100% 99% 100% 100%  Weight:      Height:        Intake/Output Summary (Last 24 hours) at 07/06/2017 0755 Last data filed at 07/06/2017 0500 Gross per 24 hour  Intake 755 ml  Output 650 ml  Net 105 ml   Filed Weights   07/03/17 1957  Weight: 91.5 kg (201 lb 11.5 oz)   Examination: Physical Exam:  Constitutional: AAM who is somnolent and drowsy from receiving Ativan and is in NAD Eyes:  Lids normal but left eye lid has some discharge. Prosthetic Left eye removed ENMT: External Ears and nose appear normal Neck: Supple with no JVD Respiratory: Diminished to auscultation  but no appreciable rhonchi or crackles. Has a lot of upper airway wheezing. Normal respiratory effort and breathing is unlabored Cardiovascular: RRR; No LE Edema noted Abdomen: Soft, NT, ND. Bowel sounds present GU: Deferred Musculoskeletal: No joint deformities; No cyanosis Skin: No rashes or lesions on a limited skin evaluation Neurologic: Limited due to patient participation. Wakes up and goes back to sleep. Has residual facial droop Psychiatric: Impaired judgement and inisight. Somnolent and Drowsy  Data Reviewed: I have personally reviewed following labs and imaging studies  CBC: Recent Labs  Lab 07/03/17 2003 07/03/17 2010 07/04/17 0326 07/05/17 0435 07/06/17 0538  WBC 11.1*  --  9.8 10.7* 9.2  NEUTROABS 7.1   --   --  9.2* 6.6  HGB 13.5 14.6 12.1* 11.8* 12.2*  HCT 40.5 43.0 36.3* 36.3* 37.3*  MCV 91.0  --  90.1 92.1 92.6  PLT 227  --  209 202 208   Basic Metabolic Panel: Recent Labs  Lab 07/03/17 2003 07/03/17 2010 07/04/17 0326 07/05/17 0435 07/06/17 0538  NA 135 135 135 137 139  K 3.9 4.3 3.2* 3.2* 3.7  CL 95* 97* 100* 101 107  CO2 27  --  27 19* 19*  GLUCOSE 248* 246* 154* 180* 169*  BUN 15 19 13 8 9   CREATININE 1.08 1.00 0.93  0.92 1.07 0.98  CALCIUM 9.8  --  9.1 8.5* 8.6*  MG  --   --  1.8 1.5* 2.1  PHOS  --   --   --  2.3* 2.2*   GFR: Estimated Creatinine Clearance: 78.1 mL/min (by C-G formula based on SCr of 0.98 mg/dL). Liver Function Tests: Recent Labs  Lab 07/03/17 2003 07/04/17 0326 07/06/17 0538  AST 19 22 29   ALT 17 14* 14*  ALKPHOS 76 63 60  BILITOT 1.0 0.8 2.1*  PROT 7.0 6.3* 6.5  ALBUMIN 4.0 3.4* 3.5   No results for input(s): LIPASE, AMYLASE in the last 168 hours. No results for input(s): AMMONIA in the last 168 hours. Coagulation Profile: Recent Labs  Lab 07/03/17 2003  INR 1.00   Cardiac Enzymes: Recent Labs  Lab 07/04/17 0657  TROPONINI <0.03   BNP (last 3 results) No results for input(s): PROBNP in the last 8760 hours. HbA1C: Recent Labs    07/05/17 0435  HGBA1C 6.3*   CBG: Recent Labs  Lab 07/05/17 1203 07/05/17 1728 07/05/17 2059 07/06/17 0031 07/06/17 0458  GLUCAP 156* 218* 149* 176* 176*   Lipid Profile: No results for input(s): CHOL, HDL, LDLCALC, TRIG, CHOLHDL, LDLDIRECT in the last 72 hours. Thyroid Function Tests: No results for input(s): TSH, T4TOTAL, FREET4, T3FREE, THYROIDAB in the last 72 hours. Anemia Panel: No results for input(s): VITAMINB12, FOLATE, FERRITIN, TIBC, IRON, RETICCTPCT in the last 72 hours. Sepsis Labs: No results for input(s): PROCALCITON, LATICACIDVEN in the last 168 hours.  Recent Results (from the past 240 hour(s))  MRSA PCR Screening     Status: None   Collection Time: 07/03/17 11:08  PM  Result Value Ref Range Status   MRSA by PCR NEGATIVE NEGATIVE Final    Comment:        The GeneXpert MRSA Assay (FDA approved for NASAL specimens only), is one component of a comprehensive MRSA colonization surveillance program. It is not intended to diagnose MRSA infection nor to guide or monitor treatment for MRSA infections. Performed at [facility], [address], [place], [place] [address]     Radiology Studies: Dg Chest [facility] 1 View  Result  Date: 07/05/2017 CLINICAL DATA:  Fever EXAM: PORTABLE CHEST 1 VIEW COMPARISON:  Chest x-ray dated 12/14/2014. FINDINGS: Heart size and mediastinal contours appear stable. Lungs are clear. No pleural effusion or pneumothorax seen. Osseous structures about the chest are unremarkable. IMPRESSION: No active disease.  No evidence of pneumonia or pulmonary edema. Electronically Signed   By: Bary Richard M.D.   On: 07/05/2017 14:18   Scheduled Meds: . amLODipine  10 mg Oral Daily  . atorvastatin  80 mg Oral q1800  . carvedilol  6.25 mg Oral BID WC  . enoxaparin (LOVENOX) injection  40 mg Subcutaneous Q24H  . folic acid  1 mg Intravenous Daily  . insulin aspart  0-5 Units Subcutaneous QHS  . insulin aspart  0-9 Units Subcutaneous TID WC  . multivitamin with minerals  1 tablet Oral Daily  . thiamine  100 mg Intravenous Daily   Continuous Infusions: . sodium chloride 75 mL/hr at 07/05/17 1703  . levETIRAcetam 750 mg (07/06/17 0744)    LOS: 2 days   Merlene Laughter, DO [facility] Pager [phone]  If 7PM-7AM, please contact night-coverage [email or web address] Password [facility] 07/06/2017, 7:55 AM

## 2017-07-06 NOTE — Progress Notes (Signed)
Called MRI about a time pt would go down. MRI stated that both machines already had lineups and would call when they are available. Will continue to monitor.

## 2017-07-06 NOTE — Progress Notes (Signed)
Spoke to Dr. Marland McalpineSheikh in regards to restraints on pt. MD stated to trial with them off and if needed can have placed back on. Will continue to monitor.

## 2017-07-07 ENCOUNTER — Inpatient Hospital Stay (HOSPITAL_COMMUNITY): Payer: PPO

## 2017-07-07 DIAGNOSIS — R062 Wheezing: Secondary | ICD-10-CM

## 2017-07-07 DIAGNOSIS — N179 Acute kidney failure, unspecified: Secondary | ICD-10-CM

## 2017-07-07 LAB — COMPREHENSIVE METABOLIC PANEL
ALT: 13 U/L — AB (ref 17–63)
ANION GAP: 9 (ref 5–15)
AST: 21 U/L (ref 15–41)
Albumin: 3.1 g/dL — ABNORMAL LOW (ref 3.5–5.0)
Alkaline Phosphatase: 54 U/L (ref 38–126)
BUN: 14 mg/dL (ref 6–20)
CO2: 22 mmol/L (ref 22–32)
CREATININE: 1.33 mg/dL — AB (ref 0.61–1.24)
Calcium: 8.6 mg/dL — ABNORMAL LOW (ref 8.9–10.3)
Chloride: 108 mmol/L (ref 101–111)
GFR, EST NON AFRICAN AMERICAN: 52 mL/min — AB (ref >60)
Glucose, Bld: 188 mg/dL — ABNORMAL HIGH (ref 65–99)
Potassium: 3.9 mmol/L (ref 3.5–5.1)
SODIUM: 139 mmol/L (ref 135–145)
Total Bilirubin: 1.1 mg/dL (ref 0.3–1.2)
Total Protein: 6 g/dL — ABNORMAL LOW (ref 6.5–8.1)

## 2017-07-07 LAB — CBC WITH DIFFERENTIAL/PLATELET
Basophils Absolute: 0 10*3/uL (ref 0.0–0.1)
Basophils Relative: 0 %
EOS PCT: 4 %
Eosinophils Absolute: 0.3 10*3/uL (ref 0.0–0.7)
HCT: 35.5 % — ABNORMAL LOW (ref 39.0–52.0)
Hemoglobin: 11.6 g/dL — ABNORMAL LOW (ref 13.0–17.0)
LYMPHS ABS: 2.2 10*3/uL (ref 0.7–4.0)
Lymphocytes Relative: 25 %
MCH: 30.6 pg (ref 26.0–34.0)
MCHC: 32.7 g/dL (ref 30.0–36.0)
MCV: 93.7 fL (ref 78.0–100.0)
MONO ABS: 0.8 10*3/uL (ref 0.1–1.0)
MONOS PCT: 9 %
Neutro Abs: 5.3 10*3/uL (ref 1.7–7.7)
Neutrophils Relative %: 62 %
PLATELETS: 192 10*3/uL (ref 150–400)
RBC: 3.79 MIL/uL — AB (ref 4.22–5.81)
RDW: 12.8 % (ref 11.5–15.5)
WBC: 8.6 10*3/uL (ref 4.0–10.5)

## 2017-07-07 LAB — GLUCOSE, CAPILLARY
GLUCOSE-CAPILLARY: 188 mg/dL — AB (ref 65–99)
GLUCOSE-CAPILLARY: 193 mg/dL — AB (ref 65–99)
Glucose-Capillary: 150 mg/dL — ABNORMAL HIGH (ref 65–99)
Glucose-Capillary: 187 mg/dL — ABNORMAL HIGH (ref 65–99)
Glucose-Capillary: 237 mg/dL — ABNORMAL HIGH (ref 65–99)
Glucose-Capillary: 260 mg/dL — ABNORMAL HIGH (ref 65–99)
Glucose-Capillary: 268 mg/dL — ABNORMAL HIGH (ref 65–99)

## 2017-07-07 LAB — MAGNESIUM: Magnesium: 2 mg/dL (ref 1.7–2.4)

## 2017-07-07 LAB — URINE CULTURE

## 2017-07-07 LAB — PHOSPHORUS: PHOSPHORUS: 3.2 mg/dL (ref 2.5–4.6)

## 2017-07-07 MED ORDER — SODIUM CHLORIDE 0.9 % IV SOLN: INTRAVENOUS | Status: AC

## 2017-07-07 MED ORDER — GADOBENATE DIMEGLUMINE 529 MG/ML IV SOLN: 20.0000 mL | Freq: Once | INTRAVENOUS | Status: DC | PRN

## 2017-07-07 MED ORDER — HEPARIN SODIUM (PORCINE) 5000 UNIT/ML IJ SOLN
5000.0000 [IU] | Freq: Three times a day (TID) | INTRAMUSCULAR | Status: DC
  Administered 2017-07-07 – 2017-07-13 (×17): 5000 [IU] via SUBCUTANEOUS
  Filled 2017-07-07 (×14): qty 1

## 2017-07-07 MED ORDER — IPRATROPIUM-ALBUTEROL 0.5-2.5 (3) MG/3ML IN SOLN
3.0000 mL | Freq: Four times a day (QID) | RESPIRATORY_TRACT | Status: DC
  Administered 2017-07-07: 3 mL via RESPIRATORY_TRACT
  Filled 2017-07-07: qty 3

## 2017-07-07 MED ORDER — IPRATROPIUM-ALBUTEROL 0.5-2.5 (3) MG/3ML IN SOLN
3.0000 mL | RESPIRATORY_TRACT | Status: DC | PRN
  Administered 2017-07-08 (×2): 3 mL via RESPIRATORY_TRACT
  Filled 2017-07-07 (×2): qty 3

## 2017-07-07 MED ORDER — SODIUM CHLORIDE 0.9 % IV BOLUS
500.0000 mL | Freq: Once | INTRAVENOUS | Status: AC
  Administered 2017-07-07: 500 mL via INTRAVENOUS

## 2017-07-07 NOTE — Progress Notes (Addendum)
Physical Therapy Treatment Patient Details Name: Richard [name] MRN: [id number] DOB: 06/24/1945 Today's Date: 07/07/2017    History of Present Illness This 71 y.o. male admitted with Rt sided weakness, difficulty speaking, confusion.   CT and CTA of head were unremarkable.  Pt had seizure like activity while in ED.  Dx:  complex partial seizure and ETOH withdrawal.  PMH:  CAD,  V-Fib Arrest, DM, Lt frontal hemorrhage with Rt hemiparesis.    PT Comments    Pt pleasant, conversant and willing to mobilize today. Pt perseverating with expressive aphasia noted during session as well. Pt with significant improvement in functional mobility from eval with ability to participate in transfers and gait with assist for balance, safety and direction. Pt happy to be OOB to brush teeth but had to return to bed end of session for transport to MRI. Will continue to follow to return pt to supervision level.     Follow Up Recommendations  CIR;Supervision/Assistance - 24 hour     Equipment Recommendations  Rolling walker with 5" wheels    Recommendations for Other Services       Precautions / Restrictions Precautions Precautions: Fall Precaution Comments: right hemiparesis    Mobility  Bed Mobility Overal bed mobility: Needs Assistance Bed Mobility: Supine to Sit     Supine to sit: Min assist;HOB elevated Sit to supine: Min assist   General bed mobility comments: min assist to initiate bil LE to EOB with HHA to elevate trunk and scoot fully to EOB  Transfers Overall transfer level: Needs assistance   Transfers: Sit to/from Stand;Stand Pivot Transfers Sit to Stand: Min assist;+2 safety/equipment Stand pivot transfers: Min assist;+2 safety/equipment       General transfer comment: cues for hand placement and safety with assist to rise from bed and chair. With pivot from chair to bed with bil UE support pt needing cues for safety as scissoring feet and needing assist to maintain  balance  Ambulation/Gait Ambulation/Gait assistance: Mod assist;+2 safety/equipment;+2 physical assistance Ambulation Distance (Feet): 200 Feet Assistive device: 2 person hand held assist Gait Pattern/deviations: Step-through pattern;Decreased stride length;Decreased weight shift to left   Gait velocity interpretation: <1.8 ft/sec, indicate of risk for recurrent falls General Gait Details: pt with assist for posterior right trunk rotation to prevent anterior LOB, assist with bil UE with progression to LUE with cues for safety and sequence, wide BOS and needing to stop and redirect x 3 due to right anterior trunk propulsion   Stairs             Wheelchair Mobility    Modified Rankin (Stroke Patients Only)       Balance Overall balance assessment: Needs assistance   Sitting balance-Leahy Scale: Fair       Standing balance-Leahy Scale: Poor                              Cognition Arousal/Alertness: Awake/alert Behavior During Therapy: WFL for tasks assessed/performed Overall Cognitive Status: Impaired/Different from baseline Area of Impairment: Attention;Following commands;Orientation                 Orientation Level: Time;Place;Situation Current Attention Level: Sustained   Following Commands: Follows one step commands inconsistently;Follows one step commands with increased time       General Comments: pt perseverating on "[place]" and also with brushing teeth. pt educated for place, time and situation. pt actively engaged in session today and attempting to speak to   therapist although expressive difficulties noted along with decreased attention, awareness       Exercises      General Comments        Pertinent Vitals/Pain Pain Assessment: No/denies pain    Home Living                      Prior Function            PT Goals (current goals can now be found in the care plan section) Progress towards PT goals: Goals met  and updated - see care plan    Frequency           PT Plan Current plan remains appropriate    Co-evaluation PT/OT/SLP Co-Evaluation/Treatment: Yes Reason for Co-Treatment: Complexity of the patient's impairments (multi-system involvement);For patient/therapist safety PT goals addressed during session: Mobility/safety with mobility;Balance        AM-PAC PT "6 Clicks" Daily Activity  Outcome Measure  Difficulty turning over in bed (including adjusting bedclothes, sheets and blankets)?: A Lot Difficulty moving from lying on back to sitting on the side of the bed? : Unable Difficulty sitting down on and standing up from a chair with arms (e.g., wheelchair, bedside commode, etc,.)?: Unable Help needed moving to and from a bed to chair (including a wheelchair)?: A Lot Help needed walking in hospital room?: A Lot Help needed climbing 3-5 steps with a railing? : Total 6 Click Score: 9    End of Session Equipment Utilized During Treatment: Gait belt Activity Tolerance: Patient tolerated treatment well Patient left: in bed;with call bell/phone within reach;with bed alarm set;with family/visitor present Nurse Communication: Mobility status;Precautions PT Visit Diagnosis: Unsteadiness on feet (R26.81);Muscle weakness (generalized) (M62.81);Other symptoms and signs involving the nervous system (R29.898)     Time: 3220-2542 PT Time Calculation (min) (ACUTE ONLY): 31 min  Charges:  $Gait Training: 8-22 mins                    G Codes:       Elwyn Reach, [place]    [place] 07/07/2017, 1:04 PM

## 2017-07-07 NOTE — Care Management Important Message (Signed)
Important Message  Patient Details  Name: Richard JakschLeon [name] MRN: [id number] Date of Birth: 04/05/1945   Medicare Important Message Given:  Yes    Richard [name] 07/07/2017, 2:02 PM

## 2017-07-07 NOTE — Progress Notes (Signed)
Initial Nutrition Assessment  DOCUMENTATION CODES:   Not applicable  INTERVENTION:  RD to monitor PO intake as pt declined supplementation  NUTRITION DIAGNOSIS:   Increased nutrient needs related to chronic illness as evidenced by estimated needs.  GOAL:   Patient will meet greater than or equal to 90% of their needs  MONITOR:   PO intake, Weight trends, I & O's, Labs  REASON FOR ASSESSMENT:   Low [place]    ASSESSMENT:   72 y.o. M admitted for seizures.; pt with R sided weakness, difficulty speaking and hearing, and some confusion. PMH of T2DM, stroke, ETOH abuse, CAD, STEMI. PSH of left heart cath and coronary angiography.   Pt seemed mildly confused and was hard of hearing.   Pt reports eating breakfast, lunch and dinner where he will eat "diabetic friendly foods" - did not specify food items. Pt states that his eating is good and that he is doing well at home, but here he is not hungry while here; some meals he will21 eat 100% while others he will eat only 25%. Pt reports not eating his full meals while here and that he just started eating again. Pt reports not having any recent weight changes or decreased appetite PTA.   Pt declined any supplementation.   Medications reviewed: folic acid, heparin, novolog 0-5 & 0-9 units, MVI with minerals, thiamine, keppra.   Labs reviewed: BG 188 (H), creatinine 1.33 (H), ALT 13 (L), GFR 52 (L), RBC 3.79 (L), hemoglobin 11.6 (L), HCT 35.5 (L).    Intake/Output Summary (Last 24 hours) at 07/07/2017 1450 Last data filed at 07/07/2017 1200 Gross per 24 hour  Intake 1541.67 ml  Output 300 ml  Net 1241.67 ml   Lab Results  Component Value Date   HGBA1C 6.3 (H) 07/05/2017    NUTRITION - FOCUSED PHYSICAL EXAM:    Most Recent Value  Orbital Region  Mild depletion  Upper Arm Region  Mild depletion  Thoracic and Lumbar Region  No depletion  Buccal Region  Mild depletion  Temple Region  Moderate depletion  Clavicle Bone Region   No depletion  Clavicle and Acromion Bone Region  No depletion  Scapular Bone Region  Unable to assess  Dorsal Hand  No depletion  Patellar Region  No depletion  Anterior Thigh Region  No depletion  Posterior Calf Region  No depletion  Edema (RD Assessment)  None  Hair  Reviewed  Eyes  Reviewed  Mouth  Reviewed  Skin  Reviewed  Nails  Reviewed       Diet Order:  Seizure precautions Fall precautions DIET DYS 3 Room service appropriate? Yes; Fluid consistency: Thin  EDUCATION NEEDS:   No education needs have been identified at this time  Skin:  Skin Assessment: Skin Integrity Issues: Skin Integrity Issues:: Other (Comment) Other: skin tear; L/R wrist  Last BM:  07/06/17  Height:   Ht Readings from Last 1 Encounters:  07/03/17 6\' 1"  (1.854 m)    Weight:   Wt Readings from Last 1 Encounters:  07/03/17 201 lb 11.5 oz (91.5 kg)   UBW: 200-205 lbs %UBW: 100%  Ideal Body Weight:  83.63 kg  BMI:  Body mass index is 26.61 kg/m.  Estimated Nutritional Needs:   Kcal:  2100-2300 kcal  Protein:  110-120 grams  Fluid:  2.1-2.3 L  Sherrine MaplesMelissa Tally Mattox, Dietetic Intern

## 2017-07-07 NOTE — Progress Notes (Signed)
Occupational Therapy Treatment Patient Details Name: Richard [name] MRN: [id number] DOB: April 04, 1945 Today's Date: 07/07/2017    History of present illness This 72 y.o. male admitted with Rt sided weakness, difficulty speaking, confusion.   CT and CTA of head were unremarkable.  Pt had seizure like activity while in ED.  Dx:  complex partial seizure and ETOH withdrawal.  PMH:  CAD,  V-Fib Arrest, DM, Lt frontal hemorrhage with Rt hemiparesis.   OT comments  Pt significantly improved this date.  He is able to sustain his attention to familiar ADL tasks for up to 5 mins.   He requires mod A to complete grooming due to Rt hand incoordination, and sensory deficits.  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   He ambulated on unit with mod A +2.   Son is very supportive.   Feel he will benefit from the consistency and intensity of CIR as he needs a therapy team who specializes in neurological deficits to achieve maximal functional level.    Follow Up Recommendations  CIR;Supervision/Assistance - 24 hour    Equipment Recommendations  None recommended by OT    Recommendations for Other Services Rehab consult    Precautions / Restrictions Precautions Precautions: Fall Precaution Comments: right hemiparesis       Mobility Bed Mobility Overal bed mobility: Needs Assistance Bed Mobility: Supine to Sit     Supine to sit: Min assist;HOB elevated Sit to supine: Min assist   General bed mobility comments: min assist to initiate bil LE to EOB with HHA to elevate trunk and scoot fully to EOB  Transfers Overall transfer level: Needs assistance   Transfers: Sit to/from Stand;Stand Pivot Transfers Sit to Stand: Min assist;+2 safety/equipment Stand pivot transfers: Min assist;+2 safety/equipment       General transfer comment: cues for hand placement and safety with assist to rise from bed and chair. With pivot from chair to bed with bil UE support pt needing cues for safety as scissoring feet  and needing assist to maintain balance    Balance Overall balance assessment: Needs assistance   Sitting balance-Leahy Scale: Fair       Standing balance-Leahy Scale: Poor                             ADL either performed or assessed with clinical judgement   ADL Overall ADL's : Needs assistance/impaired     Grooming: Oral care;Moderate assistance;Sitting Grooming Details (indicate cue type and reason): Pt requires assist to apply toothbrush to toothpaste, and to intiiate moving brush to mough.  Afterwards, pt took over task and was able to brush Lt side of mouth.  He required mod A to orient toothbrusth to the left side of his mouth.              Lower Body Dressing: Total assistance   Toilet Transfer: Moderate assistance;+2 for safety/equipment;+2 for physical assistance;Ambulation;Comfort height toilet;BSC;Grab bars;RW           Functional mobility during ADLs: Moderate assistance;+2 for physical assistance;+2 for safety/equipment;Rolling walker       Vision   Additional Comments: Pt requires max cues for visual scanning    Perception     Praxis      Cognition Arousal/Alertness: Awake/alert Behavior During Therapy: WFL for tasks assessed/performed Overall Cognitive Status: Impaired/Different from baseline Area of Impairment: Attention;Following commands  Orientation Level: Time;Place;Situation Current Attention Level: Sustained   Following Commands: Follows one step commands inconsistently;Follows one step commands with increased time       General Comments: pt perseverating on "[employer] and also with brushing teeth. pt education.  pt actively engaged in session today and attempting to speak to therapist although expressive difficulties noted along with decreased attention, awareness.  However, cognition difficult to accurately assess due to h/o aphasia         Exercises     Shoulder Instructions       General  Comments VSS.  Son present.  Encouraged him to take pt's eye prothesis home.     Pertinent Vitals/ Pain       Pain Assessment: No/denies pain  Home Living                                          Prior Functioning/Environment              Frequency  Min 2X/week        Progress Toward Goals  OT Goals(current goals can now be found in the care plan section)  Progress towards OT goals: Progressing toward goals     Plan Discharge plan remains appropriate    Co-evaluation    PT/OT/SLP Co-Evaluation/Treatment: Yes Reason for Co-Treatment: Complexity of the patient's impairments (multi-system involvement);Necessary to address cognition/behavior during functional activity;For patient/therapist safety;To address functional/ADL transfers PT goals addressed during session: Mobility/safety with mobility;Balance OT goals addressed during session: ADL's and self-care      AM-PAC PT "6 Clicks" Daily Activity     Outcome Measure   Help from another person eating meals?: Total Help from another person taking care of personal grooming?: A Lot Help from another person toileting, which includes using toliet, bedpan, or urinal?: A Lot Help from another person bathing (including washing, rinsing, drying)?: A Lot Help from another person to put on and taking off regular upper body clothing?: A Lot Help from another person to put on and taking off regular lower body clothing?: Total 6 Click Score: 10    End of Session Equipment Utilized During Treatment: Gait belt;Rolling walker  OT Visit Diagnosis: Cognitive communication deficit (R41.841)   Activity Tolerance Patient tolerated treatment well   Patient Left in bed;with call bell/phone within reach;with bed alarm set;with nursing/sitter in room;with family/visitor present   Nurse Communication Mobility status        Time: 4540-9811 OT Time Calculation (min): 31 min  Charges: OT General Charges $OT Visit:  1 Visit OT Treatments $Self Care/Home Management : 8-22 mins  [employer], OTR/L [phone]    Jeani Hawking M 07/07/2017, 3:18 PM

## 2017-07-07 NOTE — Progress Notes (Signed)
PROGRESS NOTE    Richard [name]  WUJ:[id number] DOB: 1946/02/19 DOA: 07/03/2017 PCP: Elias Else, MD   Brief Narrative:  Richard [name] is a 72 y.o. 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 CT head and CTA head/neck were unremarkable and the patient had evident seizure-like activity while in the ED. Neurology was consulted, seizure terminated with ativan and keppra load. He is currently being treated for new complex partial seizure and EtOH withdrawal. CIWA was elevated and currently on SDU withdrawal protocol.   This AM patient had received Ativan for a CIWA score of 15 and was somnolent afterwards and could not participate in subjective History. Restraints were removed and MRI done today.  Assessment & Plan:   Principal Problem:   Seizures (HCC) Active Problems:   Essential hypertension   CAD (coronary artery disease)   Alcohol abuse   Acute encephalopathy   Seizure (HCC)  New-onset complex Partial Seizure  -Suspect this is in the setting of left parietal encephalomalacia and possible alcohol withdrawal. -Neurology following and appreciated reccs -EEG done and showed diffuse slowing of the waking background. Per Neuro Dr. Otelia Limes No electrographic seizures seen; however, the diffuse slowing may be a postictal phenomenon -Continue IV Keppra 750mg  BID following load; Neuro recommending it at D/C and will change to po in AM -Continue to Monitor neuro status in SDU -Soft restraints only as needed -MRI done and showed Partial and motion degraded exam without acute finding. Remote posterior left frontal hemorrhage. 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 Extensive chronic small vessel ischemia and generalized atrophy. -Follow up with Neurology as an outpatient  -Recommendations per  Neurology Dr. Otelia Limes  -PT/OT evaluated and recommending CIR; CIR MD evaluated and felt he is a candidate for Rehab but may not get Insurance Approval -Will Have Social Worker Weigh In  Alcohol Withdrawal, improving  -Continue CIWA; Score this AM was 3 -C/w Lorazepam 2-3 mg IV q1hprn EtOH withdrawal Based on CIWA Score -Continues to be Agitated at times per Nursing but now Somnolent -Soft Restraints removed -C/w Folic Acid, MVI, and Thiamine   CAD status post stenting in the setting of ST elevation MI in March 2017 -SLP recommending Dysphagia 3 Diet with Thin Liquids with Full Staff Supervision for use of Swallowing Precautions -Will have SLP re-evaluate  -Resume Home Medications of Atorvastatin 80 mg po Daily, Carvedilol 6.25 mg po BID and NTG 0.4 mg SL NTG -Continue to hold Lisinopril-HCTZ 20-12.5 mg po daily for now   Hypertension -BP was 169/88  this AM but now improved to 148/88 -Continue with IV Hydralazine 10 mg q4hprn for parameters of now SBP >180 or DBP >100 as restarting home Meds -Resume Home Carvedilol and Home Amlodipine 10 mg po daily  -Continue to hold Lisinopril-HCTZ 20-12.5 mg po daily for now but will resume in AM if Cr is improved   History of intracranial hemorrhage in the setting of marked hypertension.   -Resume Home BP medications as above -Resume Home Atorvastatin 80 mg po Daily   Diabetes Mellitus Type 2  -Patient takes NPH insulin and sugars were ranging in the lower range -Change Sensitive Novolog SSI q4h to Sensitive Novolog SSI AC/HS -CBG's ranging from 150-268; Continue to Monitor CBG's closely  -Checked Hemoglobin A1c and was 6.3  Hypokalemia  -Patient's K+ was 3.9 -Continue to Monitor and Replete as  Necessary -Repeat CMP in AM   Leukocytosis, improved -Mild and went from 9.8 -> 10.7 -> 9.2 -> 8.6 -Given Patient's Temperature the night before last was pan-cultured -Blood Cx x2 pending -Urinalysis never collected but will re-order, Urine Cx  never sent,  -CXR showed No active disease.  No evidence of pneumonia or pulmonary edema.  Normocytic Anemia -Patient's Hb/Hct went from 12.1/36.3 -> 11.8/36.3 -> 12.2/37.3 -> 11.6/35.5 -Continue to Monitor for S/Sx of Bleeding -Repeat CBC in AM   Hypomagnesemia -Patient's Mag Level was 1.5 and improved to 2.0 -Continue to Monitor and Replete as Necessary -Repeat Mag Level in AM   Hypophosphatemia  -Patient's Phos was 2.2 and improved to 3.2 -Continue to Monitor and Replete as Necessary -Repeat Phos Level in AM   Hyperbilirubinemia  -T Bili was 2.1 and improved to 1.1 -Continue to Monitor  -Repeat CMP in AM and if not improving will check LDH and RUQ U/S  AKI  -Patient's BUN/Cr went from 9/0.98 -> 14/1.33 -Give 500 mL Bolus and Give IVF rehydration at 75 mL/hr -Continue to Hold Nephrotoxic Medications -Repeat CMP in AM   Wheezing -Started DuoNebs -C/w Guaifenesin  -Get CXR in AM  DVT prophylaxis: Enoxaparin 40 mg sq q24h discontinued and changed to Heparin 5,000 units sq q8h given AKI Code Status: FULL CODE Family Communication: No family present at bedside Disposition Plan: Remain Inpatient for further workup and evaluation given pending MRI and anticipate D/C to CIR in next 24-48 hours if stable   Consultants:   Neurology Dr. Otelia Limes  Inpatient Rehab Dr. Riley Kill   Procedures:  EEG  Description: The patient is awake but mostly drowsy during the recording.  During maximal wakefulness, there is a symmetric, medium voltage 8 Hz posterior dominant rhythm that attenuates with eye opening. This is admixed with a mild amount of diffuse 4-5 Hz theta and 2-3 Hz delta slowing of the waking background.  During drowsiness, there is an increase in theta slowing of the background.  Stage 2 sleep not seen.  There were no epileptiform discharges or electrographic seizures seen.    EKG lead was unremarkable.  Impression: This awake and drowsy EEG is abnormal due to mild diffuse  slowing of the waking background.  Clinical Correlation of the above findings indicates diffuse cerebral dysfunction that is non-specific in etiology and can be seen with hypoxic/ischemic injury, toxic/metabolic encephalopathies, neurodegenerative disorders, or medication effect.  The absence of epileptiform discharges does not rule out a clinical diagnosis of epilepsy.  Clinical correlation is advised.   Antimicrobials:  Anti-infectives (From admission, onward)   None     Subjective: Seen and examined at bedside this AM and was improved mentation wise. Was not confused and was actually calm. No CP or SOB. No other concerns or complaints.   Objective: Vitals:   07/06/17 1826 07/06/17 2040 07/06/17 2358 07/07/17 0407  BP: (!) 145/82 111/75 (!) 154/85 (!) 158/88  Pulse:  76 77 83  Resp:    (!) 22  Temp:  98.2 F (36.8 C) 98.2 F (36.8 C) 98.6 F (37 C)  TempSrc:  Oral Oral Oral  SpO2:  97% 98% 100%  Weight:      Height:        Intake/Output Summary (Last 24 hours) at 07/07/2017 0806 Last data filed at 07/07/2017 0002 Gross per 24 hour  Intake 1294.17 ml  Output 150 ml  Net 1144.17 ml   Filed Weights   07/03/17 1957  Weight: 91.5 kg (201 lb 11.5 oz)  Examination: Physical Exam:  Constitutional: Richard [name] who is awake and not agitated today  Eyes:  Left false eye removed. Lids normal  ENMT: External Ears and Nose appear normal Neck: Supple with no JVD Respiratory: Diminished to auscultation with some expiratory wheezing Cardiovascular: RRR; No LE edema Abdomen: Soft, NT, ND. Bowel Sounds present GU: Deferred. Musculoskeletal: Soft, NT, ND. Bowel sounds present Skin: Warm an dry; No rashes or lesions on a limited skin eval Neurologic: Has some residual deficits from CVA; Independently moves all extremities Psychiatric: Awake and alert. Pleasant mood and affect today  Data Reviewed: I have personally reviewed following labs and imaging studies  CBC: Recent Labs  Lab  07/03/17 2003 07/03/17 2010 07/04/17 0326 07/05/17 0435 07/06/17 0538 07/07/17 0549  WBC 11.1*  --  9.8 10.7* 9.2 8.6  NEUTROABS 7.1  --   --  9.2* 6.6 5.3  HGB 13.5 14.6 12.1* 11.8* 12.2* 11.6*  HCT 40.5 43.0 36.3* 36.3* 37.3* 35.5*  MCV 91.0  --  90.1 92.1 92.6 93.7  PLT 227  --  209 202 208 192   Basic Metabolic Panel: Recent Labs  Lab 07/03/17 2003 07/03/17 2010 07/04/17 0326 07/05/17 0435 07/06/17 0538 07/07/17 0549  NA 135 135 135 137 139 139  K 3.9 4.3 3.2* 3.2* 3.7 3.9  CL 95* 97* 100* 101 107 108  CO2 27  --  27 19* 19* 22  GLUCOSE 248* 246* 154* 180* 169* 188*  BUN 15 19 13 8 9 14   CREATININE 1.08 1.00 0.93  0.92 1.07 0.98 1.33*  CALCIUM 9.8  --  9.1 8.5* 8.6* 8.6*  MG  --   --  1.8 1.5* 2.1 2.0  PHOS  --   --   --  2.3* 2.2* 3.2   GFR: Estimated Creatinine Clearance: 57.6 mL/min (A) (by C-G formula based on SCr of 1.33 mg/dL (H)). Liver Function Tests: Recent Labs  Lab 07/03/17 2003 07/04/17 0326 07/06/17 0538 07/07/17 0549  AST 19 22 29 21   ALT 17 14* 14* 13*  ALKPHOS 76 63 60 54  BILITOT 1.0 0.8 2.1* 1.1  PROT 7.0 6.3* 6.5 6.0*  ALBUMIN 4.0 3.4* 3.5 3.1*   No results for input(s): LIPASE, AMYLASE in the last 168 hours. No results for input(s): AMMONIA in the last 168 hours. Coagulation Profile: Recent Labs  Lab 07/03/17 2003  INR 1.00   Cardiac Enzymes: Recent Labs  Lab 07/04/17 0657  TROPONINI <0.03   BNP (last 3 results) No results for input(s): PROBNP in the last 8760 hours. HbA1C: Recent Labs    07/05/17 0435  HGBA1C 6.3*   CBG: Recent Labs  Lab 07/06/17 1218 07/06/17 1629 07/06/17 2036 07/06/17 2356 07/07/17 0407  GLUCAP 191* 232* 196* 187* 150*   Lipid Profile: No results for input(s): CHOL, HDL, LDLCALC, TRIG, CHOLHDL, LDLDIRECT in the last 72 hours. Thyroid Function Tests: No results for input(s): TSH, T4TOTAL, FREET4, T3FREE, THYROIDAB in the last 72 hours. Anemia Panel: No results for input(s): VITAMINB12,  FOLATE, FERRITIN, TIBC, IRON, RETICCTPCT in the last 72 hours. Sepsis Labs: No results for input(s): PROCALCITON, LATICACIDVEN in the last 168 hours.  Recent Results (from the past 240 hour(s))  MRSA PCR Screening     Status: None   Collection Time: 07/03/17 11:08 PM  Result Value Ref Range Status   MRSA by PCR NEGATIVE NEGATIVE Final    Comment:        The GeneXpert MRSA Assay (FDA approved for NASAL specimens only), is one  component of a comprehensive MRSA colonization surveillance program. It is not intended to diagnose MRSA infection nor to guide or monitor treatment for MRSA infections. Performed at [facility], [address] [place], [place] [address]   Culture, blood (routine x 2)     Status: None (Preliminary result)   Collection Time: 07/05/17  1:58 PM  Result Value Ref Range Status   Specimen Description BLOOD RIGHT ANTECUBITAL  Final   Special Requests   Final    BOTTLES DRAWN AEROBIC AND ANAEROBIC Blood Culture adequate volume   Culture   Final    NO GROWTH 1 DAY Performed at [facility], [address] [place], [place] [address]    Report Status PENDING  Incomplete  Culture, blood (routine x 2)     Status: None (Preliminary result)   Collection Time: 07/05/17  1:59 PM  Result Value Ref Range Status   Specimen Description BLOOD BLOOD RIGHT HAND  Final   Special Requests   Final    BOTTLES DRAWN AEROBIC AND ANAEROBIC Blood Culture results may not be optimal due to an inadequate volume of blood received in culture bottles   Culture   Final    NO GROWTH 1 DAY Performed at [facility], [address] [place], [place] [address]    Report Status PENDING  Incomplete    Radiology Studies: Dg Chest Port 1 View  Result Date: 07/05/2017 CLINICAL DATA:  Fever EXAM: PORTABLE CHEST 1 VIEW COMPARISON:  Chest x-ray dated 12/14/2014. FINDINGS: Heart size and mediastinal contours appear stable. Lungs are clear. No pleural effusion or pneumothorax  seen. Osseous structures about the chest are unremarkable. IMPRESSION: No active disease.  No evidence of pneumonia or pulmonary edema. Electronically Signed   By: Bary RichardStan  Maynard M.D.   On: 07/05/2017 14:18   Scheduled Meds: . amLODipine  10 mg Oral Daily  . atorvastatin  80 mg Oral q1800  . carvedilol  6.25 mg Oral BID WC  . folic acid  1 mg Intravenous Daily  . guaiFENesin  1,200 mg Oral BID  . heparin injection (subcutaneous)  5,000 Units Subcutaneous Q8H  . insulin aspart  0-5 Units Subcutaneous QHS  . insulin aspart  0-9 Units Subcutaneous TID WC  . multivitamin with minerals  1 tablet Oral Daily  . thiamine  100 mg Intravenous Daily   Continuous Infusions: . sodium chloride    . levETIRAcetam Stopped (07/06/17 2149)  . sodium chloride      LOS: 3 days   Merlene Laughtermair Latif Maguadalupe Lata, DO [facility] Pager [phone]  If 7PM-7AM, please contact night-coverage [email or web address] Password [email or web address] 07/07/2017, 8:06 AM

## 2017-07-07 NOTE — Progress Notes (Signed)
  Speech Language Pathology Treatment: Dysphagia;Cognitive-Linquistic  Patient Details Name: Richard [name] MRN: [id number] DOB: February 09, 1946 Today's Date: 07/07/2017 Time: 0840-     Assessment / Plan / Recommendation Clinical Impression  Pt fully alert this am. Tolerated soft solids with set up assist, no signs of aspiration. Given missing dentition and left sided oral weakness as well as inability to cut and prepare foods, will continue mechanical soft diet. No f/u needed for swallowing.  Attention much improved, pt able to sustain attention to basic functional tasks in quit environment through completion. He is hard of hearing with receptive and expressive language deficits, requires repetition, visual cues for basic expression of wants and needs comprehension of orientation information. He is perseverative and working memory is poor requiring frequent repetition. Pt would benefit from f/u with SLP at next level of care, potentially CIR.    HPI HPI: Richard JakschLeon Bernier is a 72 y.o. male with history of CAD status post stenting in the setting of ST elevation MI in 2017 March, history of V. fib arrest, history of intracranial hemorrhage in September 2016 in the setting of mild hypertension, alcohol abuse was brought to the ER after patient was found to have right-sided weakness with difficulty speaking.  EMS was called and patient was brought to the ER as a code stroke.  CT negative for acute intracranial process.      SLP Plan  Continue with current plan of care(cognition goals only)       Recommendations  Diet recommendations: Thin liquid;Dysphagia 3 (mechanical soft) Liquids provided via: Cup;Straw Medication Administration: Whole meds with liquid Supervision: Patient able to self feed(set up assist) Compensations: Minimize environmental distractions                Oral Care Recommendations: Oral care BID Follow up Recommendations: 24 hour supervision/assistance Plan: Continue with current  plan of care(cognition goals only)       GO               Harlon DittyBonnie Tichina Koebel, MA CCC-SLP [phone]  Claudine MoutonDeBlois, Kashonda Sarkisyan Caroline 07/07/2017, 8:52 AM

## 2017-07-08 ENCOUNTER — Inpatient Hospital Stay (HOSPITAL_COMMUNITY): Payer: PPO

## 2017-07-08 LAB — CBC WITH DIFFERENTIAL/PLATELET
Basophils Absolute: 0 10*3/uL (ref 0.0–0.1)
Basophils Relative: 0 %
Eosinophils Absolute: 0.4 10*3/uL (ref 0.0–0.7)
Eosinophils Relative: 4 %
HEMATOCRIT: 34.5 % — AB (ref 39.0–52.0)
Hemoglobin: 11.4 g/dL — ABNORMAL LOW (ref 13.0–17.0)
LYMPHS ABS: 2.1 10*3/uL (ref 0.7–4.0)
LYMPHS PCT: 24 %
MCH: 30.1 pg (ref 26.0–34.0)
MCHC: 33 g/dL (ref 30.0–36.0)
MCV: 91 fL (ref 78.0–100.0)
MONO ABS: 1.1 10*3/uL — AB (ref 0.1–1.0)
MONOS PCT: 12 %
NEUTROS ABS: 5.2 10*3/uL (ref 1.7–7.7)
Neutrophils Relative %: 60 %
Platelets: 212 10*3/uL (ref 150–400)
RBC: 3.79 MIL/uL — ABNORMAL LOW (ref 4.22–5.81)
RDW: 12.2 % (ref 11.5–15.5)
WBC: 8.7 10*3/uL (ref 4.0–10.5)

## 2017-07-08 LAB — COMPREHENSIVE METABOLIC PANEL
ALT: 14 U/L — ABNORMAL LOW (ref 17–63)
ANION GAP: 8 (ref 5–15)
AST: 24 U/L (ref 15–41)
Albumin: 3.2 g/dL — ABNORMAL LOW (ref 3.5–5.0)
Alkaline Phosphatase: 59 U/L (ref 38–126)
BILIRUBIN TOTAL: 0.8 mg/dL (ref 0.3–1.2)
BUN: 8 mg/dL (ref 6–20)
CO2: 23 mmol/L (ref 22–32)
Calcium: 8.7 mg/dL — ABNORMAL LOW (ref 8.9–10.3)
Chloride: 106 mmol/L (ref 101–111)
Creatinine, Ser: 0.94 mg/dL (ref 0.61–1.24)
GFR calc Af Amer: >60 mL/min (ref >60)
GFR calc non Af Amer: >60 mL/min (ref >60)
Glucose, Bld: 187 mg/dL — ABNORMAL HIGH (ref 65–99)
POTASSIUM: 3.5 mmol/L (ref 3.5–5.1)
Sodium: 137 mmol/L (ref 135–145)
Total Protein: 6 g/dL — ABNORMAL LOW (ref 6.5–8.1)

## 2017-07-08 LAB — GLUCOSE, CAPILLARY
Glucose-Capillary: 184 mg/dL — ABNORMAL HIGH (ref 65–99)
Glucose-Capillary: 200 mg/dL — ABNORMAL HIGH (ref 65–99)
Glucose-Capillary: 270 mg/dL — ABNORMAL HIGH (ref 65–99)
Glucose-Capillary: 175 mg/dL — ABNORMAL HIGH (ref 65–99)
Glucose-Capillary: 230 mg/dL — ABNORMAL HIGH (ref 65–99)

## 2017-07-08 LAB — PHOSPHORUS: Phosphorus: 2.9 mg/dL (ref 2.5–4.6)

## 2017-07-08 LAB — MAGNESIUM: Magnesium: 1.7 mg/dL (ref 1.7–2.4)

## 2017-07-08 MED ORDER — LEVETIRACETAM 750 MG PO TABS
750.0000 mg | ORAL_TABLET | Freq: Two times a day (BID) | ORAL | Status: DC
Start: 2017-07-08 — End: 2017-07-13
  Administered 2017-07-08 – 2017-07-13 (×10): 750 mg via ORAL
  Filled 2017-07-08 (×10): qty 1

## 2017-07-08 MED ORDER — LISINOPRIL 20 MG PO TABS
20.0000 mg | ORAL_TABLET | Freq: Every day | ORAL | Status: DC
  Administered 2017-07-09 – 2017-07-13 (×5): 20 mg via ORAL
  Filled 2017-07-08 (×6): qty 1

## 2017-07-08 MED ORDER — FOLIC ACID 1 MG PO TABS
1.0000 mg | ORAL_TABLET | Freq: Every day | ORAL | Status: DC
  Administered 2017-07-08 – 2017-07-13 (×6): 1 mg via ORAL
  Filled 2017-07-08 (×7): qty 1

## 2017-07-08 MED ORDER — VITAMIN B-1 100 MG PO TABS
100.0000 mg | ORAL_TABLET | Freq: Every day | ORAL | Status: DC
  Administered 2017-07-08 – 2017-07-13 (×6): 100 mg via ORAL
  Filled 2017-07-08 (×7): qty 1

## 2017-07-08 NOTE — Progress Notes (Addendum)
MRI brain completed, 4/12: 1. Partial and motion degraded exam without acute finding. 2. Remote posterior left frontal hemorrhage. 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 3. Extensive chronic small vessel ischemia and generalized atrophy.  A/R: No change to neurology assessment and plan from my 4/9 progress note. Neurology will sign off. Please call if there are additional questions.  Electronically signed: Dr. Caryl PinaEric Jeffery Gammell

## 2017-07-08 NOTE — Progress Notes (Signed)
PROGRESS NOTE    Richard [name]  WJX:[id number] DOB: 1946-03-22 DOA: 07/03/2017 PCP: Elias Else, MD   Brief Narrative:  Dexton Zwilling is a 72 y.o. 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 CT head and CTA head/neck were unremarkable and the patient had evident seizure-like activity while in the ED. Neurology was consulted, seizure terminated with ativan and keppra load. He is currently being treated for new complex partial seizure and EtOH withdrawal. CIWA was elevated and currently on SDU withdrawal protocol.   This AM patient had received Ativan for a CIWA score of 15 and was somnolent afterwards and could not participate in subjective History. Restraints were removed and MRI done 07/07/17. Awaiting CIR Placement vs. SNF  Assessment & Plan:   Principal Problem:   Seizures (HCC) Active Problems:   Essential hypertension   CAD (coronary artery disease)   Alcohol abuse   Acute encephalopathy   Seizure (HCC)  New-onset complex Partial Seizure  -Suspect this is in the setting of left parietal encephalomalacia and possible alcohol withdrawal. -Neurology following and appreciated reccs -EEG done and showed diffuse slowing of the waking background. Per Neuro Dr. Otelia Limes No electrographic seizures seen; however, the diffuse slowing may be a postictal phenomenon -Continue IV Keppra 750mg  BID following load; Neuro recommending it at D/C and will changed to po this AM -Continue to Monitor neuro status in SDU -Soft restraints only as needed but discontinued.  -MRI done and showed Partial and motion degraded exam without acute finding. Remote posterior left frontal hemorrhage. 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 Extensive chronic small vessel ischemia and generalized atrophy. -Follow up with  Neurology as an outpatient  -Recommendations per Neurology Dr. Otelia Limes; has no further recommendations  -PT/OT evaluated and recommending CIR; CIR MD evaluated and felt he is a candidate for Rehab but may not get Insurance Approval -Will Have Social Worker Weigh In and [employer] and Left a Message  Alcohol Withdrawal, improving  -Continue CIWA; Score again this AM was 3 -C/w Lorazepam 2-3 mg IV q1hprn EtOH withdrawal Based on CIWA Score -Soft Restraints removed -C/w Folic Acid, MVI, and Thiamine   CAD status post stenting in the setting of ST elevation MI in March 2017 -SLP recommending Dysphagia 3 Diet with Thin Liquids with Full Staff Supervision for use of Swallowing Precautions -Will have SLP re-evaluate and recommending continuing current plan of care -Resume Home Medications of Atorvastatin 80 mg po Daily, Carvedilol 6.25 mg po BID and NTG 0.4 mg SL NTG -Resume Lisinopril-HCTZ 20-12.5 mg po daily   Hypertension -BP was 167/97  this AM but now improved to 143/82 -Continue with IV Hydralazine 10 mg q4hprn for parameters of now SBP >180 or DBP >100 as restarting home Meds -Resume Home Carvedilol and Home Amlodipine 10 mg po daily  -Held Lisinopril-HCTZ 20-12.5 mg po daily but will resume Lisinopril but will discontinue HCTZ  History of intracranial hemorrhage in the setting of marked hypertension.   -Resume Home BP medications as above -Resume Home Atorvastatin 80 mg po Daily   Diabetes Mellitus Type 2  -Patient takes NPH insulin and sugars were ranging in the lower range -Change Sensitive Novolog SSI q4h to Sensitive Novolog SSI AC/HS -CBG's ranging from 184-270; Continue to Monitor CBG's closely  -Checked Hemoglobin A1c and was 6.3  Hypokalemia  -Patient's K+ was  3.5 -Continue to Monitor and Replete as Necessary -Repeat CMP in AM   Leukocytosis, improved -Mild and went from 9.8 -> 10.7 -> 9.2 -> 8.6 -> 8.7 -Given Patient's Temperature the  night before last was pan-cultured -Blood Cx x2 show NGTD at 2 days -Urinalysis never collected initally; Urine Cx re-sent and showed Multiple Species Present,  -CXR showed No active disease.  No evidence of pneumonia or pulmonary edema.  Normocytic Anemia -Patient's Hb/Hct went from 12.1/36.3 -> 11.8/36.3 -> 12.2/37.3 -> 11.6/35.5 -> 11.4/34.5 -Continue to Monitor for S/Sx of Bleeding -Repeat CBC in AM   Hypomagnesemia -Patient's Mag Level was 1.5 and improved to 1.7 -Continue to Monitor and Replete as Necessary -Repeat Mag Level in AM   Hypophosphatemia  -Patient's Phos was 2.2 and improved to 2.9 -Continue to Monitor and Replete as Necessary -Repeat Phos Level in AM   Hyperbilirubinemia  -T Bili was 2.1 and improved to 0.8 -Continue to Monitor  -Repeat CMP in AM and if not improving will check LDH and RUQ U/S  AKI, improved   -Patient's BUN/Cr went from 9/0.98 -> 14/1.33 -> 8/0.94 -Give 500 mL Bolus and Give IVF rehydration at 75 mL/hr x 12 hours -Held Nephrotoxic Medications but will restart Lisinopril but discontinue HCTZ -Repeat CMP in AM   Wheezing, improved -Started DuoNebs -C/w Guaifenesin  -CXR this AM showed The cardiomediastinal silhouette is stable. No pneumothorax. No nodules or masses. No focal infiltrates.  DVT prophylaxis: Heparin 5,000 units sq q8h given AKI Code Status: FULL CODE Family Communication: No family present at bedside Disposition Plan: Transfer to telemetry; Pending CIR vs. SNF; Will discuss with Research scientist (life sciences)ocial Worker and Rehab Admission Coordinator. Left a Message for Fae PippinMelissa Bowie Rehab admission coordinator as she did not pick up but spoke to [place] in Social work who states she will evaluate the case.   Consultants:   Neurology Dr. Otelia LimesLindzen  Inpatient Rehab Dr. Riley KillSwartz   Procedures:  EEG  Description: The patient is awake but mostly drowsy during the recording.  During maximal wakefulness, there is a symmetric, medium voltage 8 Hz posterior  dominant rhythm that attenuates with eye opening. This is admixed with a mild amount of diffuse 4-5 Hz theta and 2-3 Hz delta slowing of the waking background.  During drowsiness, there is an increase in theta slowing of the background.  Stage 2 sleep not seen.  There were no epileptiform discharges or electrographic seizures seen.    EKG lead was unremarkable.  Impression: This awake and drowsy EEG is abnormal due to mild diffuse slowing of the waking background.  Clinical Correlation of the above findings indicates diffuse cerebral dysfunction that is non-specific in etiology and can be seen with hypoxic/ischemic injury, toxic/metabolic encephalopathies, neurodegenerative disorders, or medication effect.  The absence of epileptiform discharges does not rule out a clinical diagnosis of epilepsy.  Clinical correlation is advised.   Antimicrobials:  Anti-infectives (From admission, onward)   None     Subjective: Seen and examined at bedside this AM and had no complaints. Was very pleasant and appreciative of care. No CP or SOB. Denied any abdominal pain.   Objective: Vitals:   07/08/17 0400 07/08/17 0800 07/08/17 0946 07/08/17 1200  BP: 137/87 (!) 167/97 (!) 151/96 (!) 143/82  Pulse: 69 79  63  Resp: 12 13  11   Temp: 98.3 F (36.8 C) 98.4 F (36.9 C)  98.6 F (37 C)  TempSrc: Oral Oral  Oral  SpO2: 97% 100%  99%  Weight:  Height:        Intake/Output Summary (Last 24 hours) at 07/08/2017 1415 Last data filed at 07/08/2017 1200 Gross per 24 hour  Intake 755 ml  Output 2150 ml  Net -1395 ml   Filed Weights   07/03/17 1957  Weight: 91.5 kg (201 lb 11.5 oz)   Examination: Physical Exam:  Constitutional: AAM in NAD who is pleasant and awake.  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 Bowel sounds present GU: Deferred Musculoskeletal: No contractures; No cyanosis Skin: Warm and Dry; No appreciable rashes or lesions on a limited skin eveal Neurologic: Has some residual Right sided deficits from CVA but independently moves all extremities Psychiatric: Awake and alert; Pleasant mood and affect  Data Reviewed: I have personally reviewed following labs and imaging studies  CBC: Recent Labs  Lab 07/03/17 2003  07/04/17 0326 07/05/17 0435 07/06/17 0538 07/07/17 0549 07/08/17 0304  WBC 11.1*  --  9.8 10.7* 9.2 8.6 8.7  NEUTROABS 7.1  --   --  9.2* 6.6 5.3 5.2  HGB 13.5   < > 12.1* 11.8* 12.2* 11.6* 11.4*  HCT 40.5   < > 36.3* 36.3* 37.3* 35.5* 34.5*  MCV 91.0  --  90.1 92.1 92.6 93.7 91.0  PLT 227  --  209 202 208 192 212   < > = values in this interval not displayed.   Basic Metabolic Panel: Recent Labs  Lab 07/04/17 0326 07/05/17 0435 07/06/17 0538 07/07/17 0549 07/08/17 0304  NA 135 137 139 139 137  K 3.2* 3.2* 3.7 3.9 3.5  CL 100* 101 107 108 106  CO2 27 19* 19* 22 23  GLUCOSE 154* 180* 169* 188* 187*  BUN 13 8 9 14 8   CREATININE 0.93  0.92 1.07 0.98 1.33* 0.94  CALCIUM 9.1 8.5* 8.6* 8.6* 8.7*  MG 1.8 1.5* 2.1 2.0 1.7  PHOS  --  2.3* 2.2* 3.2 2.9   GFR: Estimated Creatinine Clearance: 81.5 mL/min (by C-G formula based on SCr of 0.94 mg/dL). Liver Function Tests: Recent Labs  Lab 07/03/17 2003 07/04/17 0326 07/06/17 0538 07/07/17 0549 07/08/17 0304  AST 19 22 29 21 24   ALT 17 14* 14* 13* 14*  ALKPHOS 76 63 60 54 59  BILITOT 1.0 0.8 2.1* 1.1 0.8  PROT 7.0 6.3* 6.5 6.0* 6.0*  ALBUMIN 4.0 3.4* 3.5 3.1* 3.2*   No results for input(s): LIPASE, AMYLASE in the last 168 hours. No results for input(s): AMMONIA in the last 168 hours. Coagulation Profile: Recent Labs  Lab 07/03/17 2003  INR 1.00   Cardiac Enzymes: Recent Labs  Lab 07/04/17 0657  TROPONINI <0.03   BNP (last 3 results) No results  for input(s): PROBNP in the last 8760 hours. HbA1C: No results for input(s): HGBA1C in the last 72 hours. CBG: Recent Labs  Lab 07/07/17 2111 07/07/17 2336 07/08/17 0445 07/08/17 0820 07/08/17 1242  GLUCAP 260* 188* 184* 200* 270*   Lipid Profile: No results for input(s): CHOL, HDL, LDLCALC, TRIG, CHOLHDL, LDLDIRECT in the last 72 hours. Thyroid Function Tests: No results for input(s): TSH, T4TOTAL, FREET4, T3FREE, THYROIDAB in the last 72 hours. Anemia Panel: No results for input(s): VITAMINB12, FOLATE, FERRITIN, TIBC, IRON, RETICCTPCT in the last 72 hours. Sepsis Labs: No results for input(s): PROCALCITON, LATICACIDVEN in the last  168 hours.  Recent Results (from the past 240 hour(s))  MRSA PCR Screening     Status: None   Collection Time: 07/03/17 11:08 PM  Result Value Ref Range Status   MRSA by PCR NEGATIVE NEGATIVE Final    Comment:        The GeneXpert MRSA Assay (FDA approved for NASAL specimens only), is one component of a comprehensive MRSA colonization surveillance program. It is not intended to diagnose MRSA infection nor to guide or monitor treatment for MRSA infections. Performed at [facility], [address], [place], [place] [address]   Culture, blood (routine x 2)     Status: None (Preliminary result)   Collection Time: 07/05/17  1:58 PM  Result Value Ref Range Status   Specimen Description BLOOD RIGHT ANTECUBITAL  Final   Special Requests   Final    BOTTLES DRAWN AEROBIC AND ANAEROBIC Blood Culture adequate volume   Culture   Final    NO GROWTH 2 DAYS Performed at [facility], [address] [place], [place] [address]    Report Status PENDING  Incomplete  Culture, blood (routine x 2)     Status: None (Preliminary result)   Collection Time: 07/05/17  1:59 PM  Result Value Ref Range Status   Specimen Description BLOOD BLOOD RIGHT HAND  Final   Special Requests   Final    BOTTLES DRAWN AEROBIC AND ANAEROBIC Blood Culture results  may not be optimal due to an inadequate volume of blood received in culture bottles   Culture   Final    NO GROWTH 2 DAYS Performed at [facility], [address], [place], [place] [address]    Report Status PENDING  Incomplete  Culture, Urine     Status: Abnormal   Collection Time: 07/06/17  8:13 AM  Result Value Ref Range Status   Specimen Description URINE, CLEAN CATCH  Final   Special Requests   Final    NONE Performed at [facility], [address] [place], [place] [address]    Culture MULTIPLE SPECIES PRESENT, SUGGEST RECOLLECTION (A)  Final   Report Status 07/07/2017 FINAL  Final    Radiology Studies: Mr Brain Wo Contrast  Result Date: 07/07/2017 CLINICAL DATA:  Right-sided weakness and difficulty speaking. History of left frontal hemorrhage. EXAM: MRI HEAD WITHOUT CONTRAST TECHNIQUE: Multiplanar, multiecho pulse sequences of the brain and surrounding structures were obtained without intravenous contrast. COMPARISON:  Head CT 07/03/2017.  Brain MRI 06/08/2015 FINDINGS: Brain: No acute infarction, hemorrhage, hydrocephalus, extra-axial collection or mass lesion. Remote left posterior frontal hematoma with hemosiderin lined cavity. Small remote right posterior frontal and left occipital cortically based infarcts. Extensive chronic small vessel ischemia with confluent gliosis in the deep cerebral white matter. Mild to moderate for age cerebral atrophy that is generalized. Vascular: Major flow voids are preserved. Skull and upper cervical spine: No evidence of marrow lesion. 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 Minor ethmoid and sphenoid sinus mucosal thickening with left sphenoid sinus secretions. Other: Scattered smoothly contoured subcutaneous scalp masses that are chronic and consistent with dermal inclusion cysts or similar benign process. Partial and motion degraded exam. IMPRESSION: 1. Partial and motion degraded exam without acute finding. 2. Remote posterior left  frontal hemorrhage. 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 3. Extensive chronic small vessel ischemia and generalized atrophy. Electronically Signed   By: Marnee Spring M.D.   On: 07/07/2017 12:52   Dg Chest [facility]  Result Date: 07/08/2017 CLINICAL DATA:  Shortness of breath EXAM: PORTABLE CHEST 1 VIEW COMPARISON:  July 05, 2017 FINDINGS: The cardiomediastinal silhouette is stable. No pneumothorax. No nodules or masses. No focal infiltrates. IMPRESSION: No active disease. Electronically Signed   By: Gerome Sam III M.D   On: 07/08/2017 07:47   Scheduled Meds: . amLODipine  10 mg Oral Daily  . atorvastatin  80 mg Oral q1800  . carvedilol  6.25 mg Oral BID WC  . folic acid  1 mg Oral Daily  . guaiFENesin  1,200 mg Oral BID  . heparin injection (subcutaneous)  5,000 Units Subcutaneous Q8H  . insulin aspart  0-5 Units Subcutaneous QHS  . insulin aspart  0-9 Units Subcutaneous TID WC  . multivitamin with minerals  1 tablet Oral Daily  . thiamine  100 mg Oral Daily   Continuous Infusions: . levETIRAcetam Stopped (07/08/17 0843)    LOS: 4 days   Merlene Laughter, DO [facility] Pager [phone]  If 7PM-7AM, please contact night-coverage [email or web address] Password [email or web address] 07/08/2017, 2:15 PM

## 2017-07-08 NOTE — Progress Notes (Signed)
Patient wheezing, gave Duoneb tx, see eMAR. Report given to oncoming shift.

## 2017-07-09 LAB — COMPREHENSIVE METABOLIC PANEL
ALBUMIN: 3.2 g/dL — AB (ref 3.5–5.0)
ALK PHOS: 59 U/L (ref 38–126)
ALT: 18 U/L (ref 17–63)
AST: 24 U/L (ref 15–41)
Anion gap: 10 (ref 5–15)
CALCIUM: 9 mg/dL (ref 8.9–10.3)
CHLORIDE: 102 mmol/L (ref 101–111)
CO2: 26 mmol/L (ref 22–32)
CREATININE: 0.93 mg/dL (ref 0.61–1.24)
GFR calc non Af Amer: >60 mL/min (ref >60)
GLUCOSE: 193 mg/dL — AB (ref 65–99)
Potassium: 3.4 mmol/L — ABNORMAL LOW (ref 3.5–5.1)
SODIUM: 138 mmol/L (ref 135–145)
Total Bilirubin: 0.9 mg/dL (ref 0.3–1.2)
Total Protein: 6.4 g/dL — ABNORMAL LOW (ref 6.5–8.1)

## 2017-07-09 LAB — GLUCOSE, CAPILLARY
GLUCOSE-CAPILLARY: 220 mg/dL — AB (ref 65–99)
GLUCOSE-CAPILLARY: 225 mg/dL — AB (ref 65–99)
GLUCOSE-CAPILLARY: 218 mg/dL — AB (ref 65–99)
Glucose-Capillary: 309 mg/dL — ABNORMAL HIGH (ref 65–99)

## 2017-07-09 LAB — CBC WITH DIFFERENTIAL/PLATELET
BASOS ABS: 0 10*3/uL (ref 0.0–0.1)
BASOS PCT: 0 %
EOS ABS: 0.3 10*3/uL (ref 0.0–0.7)
EOS PCT: 4 %
HCT: 34.3 % — ABNORMAL LOW (ref 39.0–52.0)
HEMOGLOBIN: 11.4 g/dL — AB (ref 13.0–17.0)
LYMPHS ABS: 2.1 10*3/uL (ref 0.7–4.0)
Lymphocytes Relative: 29 %
MCH: 30.2 pg (ref 26.0–34.0)
MCHC: 33.2 g/dL (ref 30.0–36.0)
MCV: 90.7 fL (ref 78.0–100.0)
Monocytes Absolute: 0.7 10*3/uL (ref 0.1–1.0)
Monocytes Relative: 10 %
NEUTROS PCT: 57 %
Neutro Abs: 4.2 10*3/uL (ref 1.7–7.7)
Platelets: 222 10*3/uL (ref 150–400)
RBC: 3.78 MIL/uL — AB (ref 4.22–5.81)
RDW: 12.2 % (ref 11.5–15.5)
WBC: 7.4 10*3/uL (ref 4.0–10.5)

## 2017-07-09 LAB — PHOSPHORUS: PHOSPHORUS: 2.8 mg/dL (ref 2.5–4.6)

## 2017-07-09 LAB — MAGNESIUM: Magnesium: 1.7 mg/dL (ref 1.7–2.4)

## 2017-07-09 MED ORDER — MAGNESIUM SULFATE 2 GM/50ML IV SOLN
2.0000 g | Freq: Once | INTRAVENOUS | Status: AC
  Administered 2017-07-09: 2 g via INTRAVENOUS
  Filled 2017-07-09: qty 50

## 2017-07-09 MED ORDER — POTASSIUM CHLORIDE 20 MEQ PO PACK
40.0000 meq | PACK | Freq: Two times a day (BID) | ORAL | Status: AC
  Administered 2017-07-09 (×2): 40 meq via ORAL
  Filled 2017-07-09 (×2): qty 2

## 2017-07-09 NOTE — NC FL2 (Addendum)
[place] MEDICAID FL2 LEVEL OF CARE SCREENING TOOL     IDENTIFICATION  Patient Name: Richard [name] Birthdate: [birth year] Sex: male Admission Date (Current Location): 07/03/2017  [facility] and [place] Number:  Producer, television/film/video and Address:  [facility], [address], [place], [place] [address]      Provider Number: [id number]  Attending Physician Name and Address:  Merlene Laughter, DO  Relative Name and Phone Number:   Charise Killian, son, [phone]    Current Level of Care: Hospital Recommended Level of Care: Skilled Nursing Facility Prior Approval Number:    Date Approved/Denied:   PASRR Number: [id number]  Discharge Plan: SNF    Current Diagnoses: Patient Active Problem List   Diagnosis Date Noted  . Alcohol abuse 07/04/2017  . Acute encephalopathy 07/04/2017  . Seizure (HCC) 07/04/2017  . Seizures (HCC) 07/03/2017  . CAD (coronary artery disease) 06/22/2015  . Hypertensive heart disease 06/14/2015  . Hyperlipidemia 06/14/2015  . 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 Aphasia complicating stroke 02/09/2015  . 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 Type 2 diabetes mellitus with peripheral neuropathy (HCC)   . Essential hypertension   . Alcohol withdrawal (HCC)   . Cerebral hemorrhage (HCC)   . Endotracheally intubated     Orientation RESPIRATION BLADDER Height & Weight     Self, Situation, Place  Normal Incontinent, External catheter Weight: 201 lb 11.5 oz (91.5 kg) Height:  6\' 1"  (185.4 cm)  BEHAVIORAL SYMPTOMS/MOOD NEUROLOGICAL BOWEL NUTRITION STATUS      Incontinent Diet(see discharge summary)  AMBULATORY STATUS COMMUNICATION OF NEEDS Skin   Extensive Assist Verbally(expressive aphasia) Normal(skin tear l wrist)                       Personal Care Assistance Level of Assistance  Bathing, Feeding, Dressing Bathing Assistance: Maximum  assistance Feeding assistance: Limited assistance Dressing Assistance: Limited assistance     Functional Limitations Info  Sight, Hearing, Speech Sight Info: Impaired(prosthesis in L eye) Hearing Info: Adequate Speech Info: Impaired(expressive aphasia)    SPECIAL CARE FACTORS FREQUENCY  PT (By licensed PT), OT (By licensed OT)     PT Frequency: 3x week OT Frequency: 2x week            Contractures Contractures Info: Not present    Additional Factors Info  Code Status, Allergies Code Status Info: Full Code Allergies Info: No Known Allergies           Current Medications (07/09/2017):  This is the current hospital active medication list Current Facility-Administered Medications  Medication Dose Route Frequency Provider Last Rate Last Dose  . acetaminophen (TYLENOL) tablet 650 mg  650 mg Oral Q4H PRN Eduard Clos, MD   650 mg at 07/09/17 1115   Or  . acetaminophen (TYLENOL) solution 650 mg  650 mg Per Tube Q4H PRN Eduard Clos, MD       Or  . acetaminophen (TYLENOL) suppository 650 mg  650 mg Rectal Q4H PRN Eduard Clos, MD      . amLODipine (NORVASC) tablet 10 mg  10 mg Oral Daily Marguerita Merles [place], DO   10 mg at 07/09/17 1100  . atorvastatin (LIPITOR) tablet 80 mg  80 mg Oral [address] [place], DO   80 mg at 07/08/17 1741  . carvedilol (COREG) tablet 6.25 mg  6.25 mg Oral BID WC Marguerita Merles [place], DO  6.25 mg at 07/09/17 1100  . folic acid (FOLVITE) tablet 1 mg  1 mg Oral Daily Sheikh, Kateri McOmair [place]if, DO   1 mg at 07/09/17 1100  . gadobenate dimeglumine (MULTIHANCE) injection 20 mL  20 mL Intravenous Once PRN Sheikh, Omair Latif, DO      . guaiFENesin [facility](MUCINEX) 12 hr tablet 1,200 mg  1,200 mg Oral BID Sheikh, Omair Latif, DO   1,200 mg at 07/09/17 1100  . heparin injection 5,000 Units  5,000 Units Subcutaneous [address] Sheikh, Omair [place], [place]   5,000 Units at 07/09/17 [id number]  . hydrALAZINE (APRESOLINE) injection 10 mg  10 mg Intravenous Q4H PRN  Eduard ClosKakrakandy, Arshad N, MD   10 mg at 07/06/17 0045  . insulin aspart (novoLOG) injection 0-5 Units  0-5 Units Subcutaneous QHS Marguerita MerlesSheikh, Omair [place], [place]   2 Units at 07/08/17 2253  . insulin aspart (novoLOG) injection 0-9 Units  0-9 Units Subcutaneous TID WC Marguerita MerlesSheikh, Omair [place], DO   7 Units at 07/09/17 1211  . ipratropium-albuterol (DUONEB) 0.5-2.5 (3) MG/3ML nebulizer solution 3 mL  3 mL Nebulization Q4H PRN Marguerita MerlesSheikh, Omair [place], DO   3 mL at 07/08/17 2157  . levETIRAcetam (KEPPRA) tablet 750 mg  750 mg Oral BID Marguerita MerlesSheikh, Omair Latif, DO   750 mg at 07/09/17 1100  . lisinopril (PRINIVIL,ZESTRIL) tablet 20 mg  20 mg Oral Daily Marguerita MerlesSheikh, Omair Latif, DO   20 mg at 07/09/17 1100  . LORazepam (ATIVAN) injection 2-3 mg  2-3 mg Intravenous Q1H PRN Eduard ClosKakrakandy, Arshad N, MD   3 mg at 07/06/17 0816  . multivitamin with minerals tablet 1 tablet  1 tablet Oral Daily Marguerita MerlesSheikh, Omair [place], DO   1 tablet at 07/09/17 1100  . nitroGLYCERIN (NITROSTAT) SL tablet 0.4 mg  0.4 mg Sublingual Q5 min PRN Sheikh, Omair Latif, DO      . potassium chloride (KLOR-CON) packet 40 mEq  40 mEq Oral BID Sheikh, Omair Latif, DO   40 mEq at 07/09/17 0900  . thiamine (VITAMIN B-1) tablet 100 mg  100 mg Oral Daily Marguerita MerlesSheikh, Omair [place], DO   100 mg at 07/09/17 1100     Discharge Medications: Please see discharge summary for a list of discharge medications.  Relevant Imaging Results:  Relevant Lab Results:   Additional Information SS# [id number]  [name], [place]

## 2017-07-09 NOTE — Social Work (Signed)
CSW consulted for SNF back up. CSW will complete FL2 as back up for CIR. Will meet with pt and family tomorrow if CIR not appropriate.   Richard HutchingIsabel H Suhayb Anzalone, LCSWA [facility] Clinical Social Work [phone]

## 2017-07-09 NOTE — Progress Notes (Signed)
PROGRESS NOTE    Linda Biehn  ZOX:[id number] DOB: 12/09/1945 DOA: 07/03/2017 PCP: Elias Else, MD   Brief Narrative:  Eathen Budreau is a 72 y.o. 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 CT head and CTA head/neck were unremarkable and the patient had evident seizure-like activity while in the ED. Neurology was consulted, seizure terminated with ativan and keppra load. He is currently being treated for new complex partial seizure and EtOH withdrawal. CIWA was elevated and currently on SDU withdrawal protocol.   He steadily improved and Restraints were removed and MRI done 07/07/17. Awaiting CIR Placement vs. SNF and I spoke with the rehab admissions coordinator who tells me that the rehab MD will reevaluate in the a.m.  Assessment & Plan:   Principal Problem:   Seizures (HCC) Active Problems:   Essential hypertension   CAD (coronary artery disease)   Alcohol abuse   Acute encephalopathy   Seizure (HCC)  New-onset complex Partial Seizure  -Suspect this is in the setting of left parietal encephalomalacia and possible alcohol withdrawal. -Neurology following and appreciated reccs -EEG done and showed diffuse slowing of the waking background. Per Neuro Dr. Otelia Limes No electrographic seizures seen; however, the diffuse slowing may be a postictal phenomenon -Continue IV Keppra 750mg  BID following load; Neuro recommending it at D/C and will changed to po this AM -Continue to Monitor neuro status in SDU -Soft restraints only as needed but discontinued.  -MRI done and showed Partial and motion degraded exam without acute finding. Remote posterior left frontal hemorrhage. 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 Extensive chronic small vessel ischemia and generalized atrophy. -Follow up with  Neurology as an outpatient  -Recommendations per Neurology Dr. Otelia Limes; has no further recommendations  -PT/OT evaluated and recommending CIR; CIR MD evaluated and felt he is may be a candidate for Rehab and per my discussion with the rehab admissions coordinator, will have the CIR MD reevaluate in a.m. -Will Have Social Worker Weigh In case patient is unable to go to CIR, may need SNF placement.  Alcohol Withdrawal, improved -Continue CIWA; Score again this AM was 2 -C/w Lorazepam 2-3 mg IV q1hprn EtOH withdrawal Based on CIWA Score -Soft Restraints removed -C/w Folic Acid, MVI, and Thiamine   CAD status post stenting in the setting of ST elevation MI in March 2017 -SLP recommended Dysphagia 3 Diet with Thin Liquids with Full Staff Supervision for use of Swallowing Precautions -Will have SLP re-evaluate and recommending continuing current plan of care -Resume Home Medications of Atorvastatin 80 mg po Daily, Carvedilol 6.25 mg po BID and NTG 0.4 mg SL NTG -Resumed Lisinopril 20 mg po Daily, but will hold hydrochlorothiazide at this time.  Hypertension -BP was 167/97  this AM but now improved to 152/82 -Continue with IV Hydralazine 10 mg q4hprn for parameters of now SBP >180 or DBP >100 as restarting home Meds -Resume Home Carvedilol and Home Amlodipine 10 mg po daily  -Held Lisinopril-HCTZ 20-12.5 mg po daily initially, but resumed Lisinopril but will discontinue HCTZ  History of intracranial hemorrhage in the setting of marked hypertension.   -Resume Home BP medications as above -Resume Home Atorvastatin 80 mg po Daily   Diabetes Mellitus Type 2  -Patient takes NPH insulin and sugars were ranging in the lower range -Change Sensitive Novolog SSI q4h to Sensitive Novolog SSI AC/HS -  CBG's ranging from 175-309; Continue to Monitor CBG's closely  -Checked Hemoglobin A1c and was 6.3  Hypokalemia  -Patient's K+ was 3.4 this AM -Replete with potassium chloride 40 mg p.o. twice daily x2  doses -Continue to Monitor and Replete as Necessary -Repeat CMP in AM   Leukocytosis, improved -Mild and went from 9.8 -> 10.7 -> 9.2 -> 8.6 -> 8.7 -> 7.4 -Given Patient's Temperature the night before last was pan-cultured -Blood Cx x2 show NGTD at 3 days -Urinalysis never collected initally; Urine Cx re-sent and showed Multiple Species Present,  -CXR showed No active disease.  No evidence of pneumonia or pulmonary edema.  Normocytic Anemia -Patient's Hb/Hct Stable at  11.4/34.3 -Continue to Monitor for S/Sx of Bleeding -Repeat CBC in AM   Hypomagnesemia -Patient's Mag Level was 1.5 and improved to 1.7 -We will replete with IV mag sulfate 2 g -Continue to Monitor and Replete as Necessary -Repeat Mag Level in AM   Hypophosphatemia  -Patient's Phos was 2.2 and improved to 2.9 -Continue to Monitor and Replete as Necessary -Repeat Phos Level in AM   Hyperbilirubinemia  -T Bili was 2.1 and improved to 0.9 -Continue to Monitor  -Repeat CMP in AM and if worsening will check LDH and RUQ U/S  AKI, improved   -Patient's BUN/Cr went from 9/0.98 -> 14/1.33 -> 8/0.94 -> <5/0.93 -Given 500 mL Bolus and Give IVF rehydration at 75 mL/hr x 12 hours yesterday  -Held Nephrotoxic Medications but will restart Lisinopril but discontinue HCTZ -Repeat CMP in AM   Wheezing, improved -Started DuoNebs -C/w Guaifenesin  -CXR yesterday AM showed The cardiomediastinal silhouette is stable. No pneumothorax. No nodules or masses. No focal infiltrates.  DVT prophylaxis: Heparin 5,000 units sq q8h given AKI Code Status: FULL CODE Family Communication: No family present at bedside Disposition Plan: Transferred to telemetry; Pending CIR vs. SNF; Discussed with inpatient rehab coordinator states that the CIR MD will reevaluate in the a.m., if patient unable to go to inpatient rehab may benefit from skilled nursing facility and will touch base with the social worker today.  Consultants:   Neurology Dr.  Otelia Limes  Inpatient Rehab Dr. Riley Kill   Procedures:  EEG  Description: The patient is awake but mostly drowsy during the recording.  During maximal wakefulness, there is a symmetric, medium voltage 8 Hz posterior dominant rhythm that attenuates with eye opening. This is admixed with a mild amount of diffuse 4-5 Hz theta and 2-3 Hz delta slowing of the waking background.  During drowsiness, there is an increase in theta slowing of the background.  Stage 2 sleep not seen.  There were no epileptiform discharges or electrographic seizures seen.    EKG lead was unremarkable.  Impression: This awake and drowsy EEG is abnormal due to mild diffuse slowing of the waking background.  Clinical Correlation of the above findings indicates diffuse cerebral dysfunction that is non-specific in etiology and can be seen with hypoxic/ischemic injury, toxic/metabolic encephalopathies, neurodegenerative disorders, or medication effect.  The absence of epileptiform discharges does not rule out a clinical diagnosis of epilepsy.  Clinical correlation is advised.   Antimicrobials:  Anti-infectives (From admission, onward)   None     Subjective: Seen and examined at bedside this a.m. and was in no acute distress.  Denied any chest pain or shortness of breath.  Felt well and slept okay.  Objective: Vitals:   07/08/17 2112 07/08/17 2300 07/09/17 0000 07/09/17 0300  BP: (!) 141/92  (!) 152/82   Pulse: 84  Resp: 19  12   Temp: 98.6 F (37 C) 98.7 F (37.1 C)  98.8 F (37.1 C)  TempSrc: Oral Oral  Oral  SpO2: 100%     Weight:      Height:        Intake/Output Summary (Last 24 hours) at 07/09/2017 1133 Last data filed at 07/09/2017 0300 Gross per 24 hour  Intake 480 ml  Output 2200 ml  Net -1720 ml   Filed Weights   07/03/17 1957  Weight: 91.5 kg (201 lb 11.5 oz)   Examination: Physical Exam:  Constitutional: Well-nourished well-developed African-American male in no acute distress appears  calm and comfortable. Eyes: Left eye missing.  Right eye pupil normal no sclera or icterus. ENMT: External ears and nose appear normal. MMM Neck: Supple with no JVD. Respiratory: Diminished to auscultation.  Unlabored breathing, no appreciable wheezing, rales, or rhonchi. Cardiovascular: Regular rate and rhythm, no appreciable murmurs, rubs, or gallops.  No lower extremity edema. Abdomen: Soft, nontender, nondistended.  Comes bowel sounds present in all 4 quadrants. GU: Deferred. Musculoskeletal: No contractures, no cyanosis. Skin: Warm and dry with no appreciable rashes or lesions noted on a limited skin eval. Neurologic: Moves all extremities except spontaneously.  Has some mild residual deficits from previous CVA. Psychiatric: Pleasant mood and affect, awake and alert.  Data Reviewed: I have personally reviewed following labs and imaging studies  CBC: Recent Labs  Lab 07/05/17 0435 07/06/17 0538 07/07/17 0549 07/08/17 0304 07/09/17 0445  WBC 10.7* 9.2 8.6 8.7 7.4  NEUTROABS 9.2* 6.6 5.3 5.2 4.2  HGB 11.8* 12.2* 11.6* 11.4* 11.4*  HCT 36.3* 37.3* 35.5* 34.5* 34.3*  MCV 92.1 92.6 93.7 91.0 90.7  PLT 202 208 192 212 222   Basic Metabolic Panel: Recent Labs  Lab 07/05/17 0435 07/06/17 0538 07/07/17 0549 07/08/17 0304 07/09/17 0445  NA 137 139 139 137 138  K 3.2* 3.7 3.9 3.5 3.4*  CL 101 107 108 106 102  CO2 19* 19* 22 23 26   GLUCOSE 180* 169* 188* 187* 193*  BUN 8 9 14 8  <5*  CREATININE 1.07 0.98 1.33* 0.94 0.93  CALCIUM 8.5* 8.6* 8.6* 8.7* 9.0  MG 1.5* 2.1 2.0 1.7 1.7  PHOS 2.3* 2.2* 3.2 2.9 2.8   GFR: Estimated Creatinine Clearance: 82.3 mL/min (by C-G formula based on SCr of 0.93 mg/dL). Liver Function Tests: Recent Labs  Lab 07/04/17 0326 07/06/17 0538 07/07/17 0549 07/08/17 0304 07/09/17 0445  AST 22 29 21 24 24   ALT 14* 14* 13* 14* 18  ALKPHOS 63 60 54 59 59  BILITOT 0.8 2.1* 1.1 0.8 0.9  PROT 6.3* 6.5 6.0* 6.0* 6.4*  ALBUMIN 3.4* 3.5 3.1* 3.2*  3.2*   No results for input(s): LIPASE, AMYLASE in the last 168 hours. No results for input(s): AMMONIA in the last 168 hours. Coagulation Profile: Recent Labs  Lab 07/03/17 2003  INR 1.00   Cardiac Enzymes: Recent Labs  Lab 07/04/17 0657  TROPONINI <0.03   BNP (last 3 results) No results for input(s): PROBNP in the last 8760 hours. HbA1C: No results for input(s): HGBA1C in the last 72 hours. CBG: Recent Labs  Lab 07/08/17 1242 07/08/17 1733 07/08/17 2205 07/09/17 0850 07/09/17 1120  GLUCAP 270* 175* 230* 220* 309*   Lipid Profile: No results for input(s): CHOL, HDL, LDLCALC, TRIG, CHOLHDL, LDLDIRECT in the last 72 hours. Thyroid Function Tests: No results for input(s): TSH, T4TOTAL, FREET4, T3FREE, THYROIDAB in the last 72 hours. Anemia Panel: No results for  input(s): VITAMINB12, FOLATE, FERRITIN, TIBC, IRON, RETICCTPCT in the last 72 hours. Sepsis Labs: No results for input(s): PROCALCITON, LATICACIDVEN in the last 168 hours.  Recent Results (from the past 240 hour(s))  MRSA PCR Screening     Status: None   Collection Time: 07/03/17 11:08 PM  Result Value Ref Range Status   MRSA by PCR NEGATIVE NEGATIVE Final    Comment:        The GeneXpert MRSA Assay (FDA approved for NASAL specimens only), is one component of a comprehensive MRSA colonization surveillance program. It is not intended to diagnose MRSA infection nor to guide or monitor treatment for MRSA infections. Performed at [facility], [address] [place], [place] [address]   Culture, blood (routine x 2)     Status: None (Preliminary result)   Collection Time: 07/05/17  1:58 PM  Result Value Ref Range Status   Specimen Description BLOOD RIGHT ANTECUBITAL  Final   Special Requests   Final    BOTTLES DRAWN AEROBIC AND ANAEROBIC Blood Culture adequate volume   Culture   Final    NO GROWTH 3 DAYS Performed at [facility], [address], [place], [place] [address]    Report  Status PENDING  Incomplete  Culture, blood (routine x 2)     Status: None (Preliminary result)   Collection Time: 07/05/17  1:59 PM  Result Value Ref Range Status   Specimen Description BLOOD BLOOD RIGHT HAND  Final   Special Requests   Final    BOTTLES DRAWN AEROBIC AND ANAEROBIC Blood Culture results may not be optimal due to an inadequate volume of blood received in culture bottles   Culture   Final    NO GROWTH 3 DAYS Performed at [facility], [address] [place], [place] [address]    Report Status PENDING  Incomplete  Culture, Urine     Status: Abnormal   Collection Time: 07/06/17  8:13 AM  Result Value Ref Range Status   Specimen Description URINE, CLEAN CATCH  Final   Special Requests   Final    NONE Performed at [facility], [address] [place], [place] [address]    Culture MULTIPLE SPECIES PRESENT, SUGGEST RECOLLECTION (A)  Final   Report Status 07/07/2017 FINAL  Final    Radiology Studies: Mr Brain Wo Contrast  Result Date: 07/07/2017 CLINICAL DATA:  Right-sided weakness and difficulty speaking. History of left frontal hemorrhage. EXAM: MRI HEAD WITHOUT CONTRAST TECHNIQUE: Multiplanar, multiecho pulse sequences of the brain and surrounding structures were obtained without intravenous contrast. COMPARISON:  Head CT 07/03/2017.  Brain MRI 06/08/2015 FINDINGS: Brain: No acute infarction, hemorrhage, hydrocephalus, extra-axial collection or mass lesion. Remote left posterior frontal hematoma with hemosiderin lined cavity. Small remote right posterior frontal and left occipital cortically based infarcts. Extensive chronic small vessel ischemia with confluent gliosis in the deep cerebral white matter. Mild to moderate for age cerebral atrophy that is generalized. Vascular: Major flow voids are preserved. Skull and upper cervical spine: No evidence of marrow lesion. 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 Minor ethmoid and sphenoid sinus mucosal thickening with  left sphenoid sinus secretions. Other: Scattered smoothly contoured subcutaneous scalp masses that are chronic and consistent with dermal inclusion cysts or similar benign process. Partial and motion degraded exam. IMPRESSION: 1. Partial and motion degraded exam without acute finding. 2. Remote posterior left frontal hemorrhage. 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 3. Extensive chronic small vessel ischemia and  generalized atrophy. Electronically Signed   By: Marnee SpringJonathon  Watts M.D.   On: 07/07/2017 12:52   Dg Chest Port 1 View  Result Date: 07/08/2017 CLINICAL DATA:  Shortness of breath EXAM: PORTABLE CHEST 1 VIEW COMPARISON:  July 05, 2017 FINDINGS: The cardiomediastinal silhouette is stable. No pneumothorax. No nodules or masses. No focal infiltrates. IMPRESSION: No active disease. Electronically Signed   By: Gerome Samavid  Williams III M.D   On: 07/08/2017 07:47   Scheduled Meds: . amLODipine  10 mg Oral Daily  . atorvastatin  80 mg Oral q1800  . carvedilol  6.25 mg Oral BID WC  . folic acid  1 mg Oral Daily  . guaiFENesin  1,200 mg Oral BID  . heparin injection (subcutaneous)  5,000 Units Subcutaneous Q8H  . insulin aspart  0-5 Units Subcutaneous QHS  . insulin aspart  0-9 Units Subcutaneous TID WC  . levETIRAcetam  750 mg Oral BID  . lisinopril  20 mg Oral Daily  . multivitamin with minerals  1 tablet Oral Daily  . potassium chloride  40 mEq Oral BID  . thiamine  100 mg Oral Daily   Continuous Infusions:   LOS: 5 days   Merlene Laughtermair Latif Franklyn Cafaro, DO [facility] Pager [phone]  If 7PM-7AM, please contact night-coverage [email or web address] Password [email or web address] 07/09/2017, 11:33 AM

## 2017-07-10 LAB — CBC WITH DIFFERENTIAL/PLATELET
BASOS ABS: 0 10*3/uL (ref 0.0–0.1)
Basophils Relative: 0 %
Eosinophils Absolute: 0.3 10*3/uL (ref 0.0–0.7)
Eosinophils Relative: 4 %
HEMATOCRIT: 34.4 % — AB (ref 39.0–52.0)
HEMOGLOBIN: 11.7 g/dL — AB (ref 13.0–17.0)
LYMPHS PCT: 29 %
Lymphs Abs: 2.3 10*3/uL (ref 0.7–4.0)
MCH: 30.5 pg (ref 26.0–34.0)
MCHC: 34 g/dL (ref 30.0–36.0)
MCV: 89.8 fL (ref 78.0–100.0)
Monocytes Absolute: 0.8 10*3/uL (ref 0.1–1.0)
Monocytes Relative: 10 %
NEUTROS ABS: 4.6 10*3/uL (ref 1.7–7.7)
NEUTROS PCT: 57 %
Platelets: 235 10*3/uL (ref 150–400)
RBC: 3.83 MIL/uL — AB (ref 4.22–5.81)
RDW: 12.2 % (ref 11.5–15.5)
WBC: 8 10*3/uL (ref 4.0–10.5)

## 2017-07-10 LAB — GLUCOSE, CAPILLARY
GLUCOSE-CAPILLARY: 258 mg/dL — AB (ref 65–99)
GLUCOSE-CAPILLARY: 171 mg/dL — AB (ref 65–99)
Glucose-Capillary: 193 mg/dL — ABNORMAL HIGH (ref 65–99)
Glucose-Capillary: 204 mg/dL — ABNORMAL HIGH (ref 65–99)
Glucose-Capillary: 200 mg/dL — ABNORMAL HIGH (ref 65–99)
Glucose-Capillary: 171 mg/dL — ABNORMAL HIGH (ref 65–99)

## 2017-07-10 LAB — COMPREHENSIVE METABOLIC PANEL
ALK PHOS: 60 U/L (ref 38–126)
ALT: 23 U/L (ref 17–63)
AST: 32 U/L (ref 15–41)
Albumin: 3.5 g/dL (ref 3.5–5.0)
Anion gap: 10 (ref 5–15)
BILIRUBIN TOTAL: 1 mg/dL (ref 0.3–1.2)
CALCIUM: 8.9 mg/dL (ref 8.9–10.3)
CO2: 25 mmol/L (ref 22–32)
CREATININE: 0.87 mg/dL (ref 0.61–1.24)
Chloride: 100 mmol/L — ABNORMAL LOW (ref 101–111)
GFR calc Af Amer: >60 mL/min (ref >60)
GFR calc non Af Amer: >60 mL/min (ref >60)
GLUCOSE: 206 mg/dL — AB (ref 65–99)
POTASSIUM: 3.3 mmol/L — AB (ref 3.5–5.1)
Sodium: 135 mmol/L (ref 135–145)
TOTAL PROTEIN: 6.3 g/dL — AB (ref 6.5–8.1)

## 2017-07-10 LAB — CULTURE, BLOOD (ROUTINE X 2)
CULTURE: NO GROWTH
Culture: NO GROWTH
SPECIAL REQUESTS: ADEQUATE

## 2017-07-10 LAB — PHOSPHORUS: Phosphorus: 2.4 mg/dL — ABNORMAL LOW (ref 2.5–4.6)

## 2017-07-10 LAB — MAGNESIUM: Magnesium: 1.8 mg/dL (ref 1.7–2.4)

## 2017-07-10 MED ORDER — POTASSIUM PHOSPHATES 15 MMOLE/5ML IV SOLN
10.0000 mmol | Freq: Once | INTRAVENOUS | Status: AC
  Administered 2017-07-10: 10 mmol via INTRAVENOUS
  Filled 2017-07-10: qty 3.33

## 2017-07-10 MED ORDER — POTASSIUM CHLORIDE 10 MEQ/100ML IV SOLN
10.0000 meq | INTRAVENOUS | Status: AC
  Administered 2017-07-10 (×2): 10 meq via INTRAVENOUS
  Filled 2017-07-10 (×3): qty 100

## 2017-07-10 MED ORDER — POTASSIUM CHLORIDE 20 MEQ PO PACK
40.0000 meq | PACK | Freq: Two times a day (BID) | ORAL | Status: AC
  Administered 2017-07-10 (×2): 40 meq via ORAL
  Filled 2017-07-10 (×2): qty 2

## 2017-07-10 MED ORDER — POTASSIUM PHOSPHATE MONOBASIC 500 MG PO TABS
500.0000 mg | ORAL_TABLET | Freq: Two times a day (BID) | ORAL | Status: DC
  Filled 2017-07-10: qty 1

## 2017-07-10 MED ORDER — K PHOS MONO-SOD PHOS DI & MONO 155-852-130 MG PO TABS
500.0000 mg | ORAL_TABLET | Freq: Two times a day (BID) | ORAL | Status: DC
  Administered 2017-07-10 – 2017-07-12 (×6): 500 mg via ORAL
  Filled 2017-07-10 (×7): qty 2

## 2017-07-10 NOTE — Progress Notes (Signed)
PROGRESS NOTE    Richard [name]  PPI:[id number] DOB: 1945-06-04 DOA: 07/03/2017 PCP: Elias Else, MD   Brief Narrative:  Richard [name] is a 72 y.o. 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 CT head and CTA head/neck were unremarkable and the patient had evident seizure-like activity while in the ED. Neurology was consulted, seizure terminated with ativan and keppra load. He is currently being treated for new complex partial seizure and EtOH withdrawal. CIWA was elevated and currently on SDU withdrawal protocol.   He steadily improved and Restraints were removed and MRI done 07/07/17. Awaiting CIR Placement vs. SNF.  Unfortunately patient became agitated overnight and ended up back in restraints and received Ativan during the early hours.  This morning around to go evaluate him, he was somnolent with difficulty arousing secondary to the abdomen administration.  After discussion with the daytime nurse she is told me that his see was score was 18 and he was administered Ativan and put in restraints.  I told her to remove the restraints and observe.  Assessment & Plan:   Principal Problem:   Seizures (HCC) Active Problems:   Essential hypertension   CAD (coronary artery disease)   Alcohol abuse   Acute encephalopathy   Seizure (HCC)  New-onset complex Partial Seizure  -Suspect this is in the setting of left parietal encephalomalacia and possible alcohol withdrawal. -Neurology following and appreciated reccs -EEG done and showed diffuse slowing of the waking background. Per Neuro Dr. Otelia Limes No electrographic seizures seen; however, the diffuse slowing may be a postictal phenomenon -Continue IV Keppra 750mg  BID following load; Neuro recommending it at D/C and will changed to po this AM -Continue to Monitor  neuro status in SDU -Soft restraints only as needed but discontinued.  -MRI done and showed Partial and motion degraded exam without acute finding. Remote posterior left frontal hemorrhage. 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 Extensive chronic small vessel ischemia and generalized atrophy. -Follow up with Neurology as an outpatient  -Recommendations per Neurology Dr. Otelia Limes; has no further recommendations  -PT/OT evaluated and recommending CIR; CIR MD evaluated and felt he is may be a candidate for Rehab and per my discussion with the rehab admissions coordinator today she may not qualify. -CIR MD reevaluate in a.m however patient was encephalopathic. -Will Have Social Worker Weigh In case patient is unable to go to CIR, may need SNF placement.  Social worker aware.  And patient has to be out of restraints and off of IV sedation/4 hours.  Alcohol Withdrawal, improved -Continue CIWA; Score again yesterdat AM was 2 -D/C'd Lorazepam 2-3 mg IV q1hprn EtOH withdrawal Based on CIWA Score -Soft Restraints removed again  -C/w Folic Acid, MVI, and Thiamine   Agitation -Received IV Ativan Last night and got put back in Restraints -CIWA Protocol continued as patient has been lucid for the last 2 days and likely had some acute delirium and sundowning which was mistaken for alcohol withdrawal prompting the nurse to give the patient IV Ativan. -We will discontinue the IV Ativan CIWA protocol and remove the patient's restraints in anticipation for the patient being discharged to a skilled nursing facility versus inpatient rehabilitation  CAD status post stenting in the setting of ST elevation MI in March 2017 -SLP recommended Dysphagia 3 Diet with Thin Liquids with Full Staff Supervision for use of  Swallowing Precautions -Will have SLP re-evaluate and recommending continuing current plan of care -Resumed Home Medications of Atorvastatin 80 mg po Daily, Carvedilol 6.25 mg po BID and  NTG 0.4 mg SL NTG -Resumed Lisinopril 20 mg po Daily, but will hold hydrochlorothiazide at this time.  Hypertension -BP this Afternoon was 115/86 -Continue with IV Hydralazine 10 mg q4hprn for parameters of now SBP >180 or DBP >100 as restarting home Meds -Resume Home Carvedilol and Home Amlodipine 10 mg po daily  -Held Lisinopril-HCTZ 20-12.5 mg po daily initially, but resumed Lisinopril but will discontinue HCTZ  History of intracranial hemorrhage in the setting of marked hypertension.   -Resume Home BP medications as above -Resume Home Atorvastatin 80 mg po Daily   Diabetes Mellitus Type 2  -Patient takes NPH insulin and sugars were ranging in the lower range -Change Sensitive Novolog SSI q4h to Sensitive Novolog SSI AC/HS -CBG's ranging from 200-258; Continue to Monitor CBG's closely  -Checked Hemoglobin A1c and was 6.3  Hypokalemia  -Patient's K+ was 3.4 this AM -Replete with potassium chloride 40 mg p.o. twice daily x2 doses -Continue to Monitor and Replete as Necessary -Repeat CMP in AM   Leukocytosis, improved -Mild and went from 9.8 -> 10.7 -> 9.2 -> 8.6 -> 8.7 -> 7.4 -> 8.0 -Given Patient's Temperature the night before last was pan-cultured -Blood Cx x2 show NGTD at 5 days -Urinalysis never collected initally; Urine Cx re-sent and showed Multiple Species Present,  -CXR showed No active disease.  No evidence of pneumonia or pulmonary edema.  Normocytic Anemia -Patient's Hb/Hct Stable at 11.7/34.4 -Continue to Monitor for S/Sx of Bleeding -Repeat CBC in AM   Hypomagnesemia -Patient's Mag Level was 1.5 and improved to 1.8 -Continue to Monitor and Replete as Necessary -Repeat Mag Level in AM   Hypophosphatemia  -Patient's Phos was 2.4 -Attempted to replete with K-Phos 500 mg p.o. twice daily x2 doses however patient is not taking p.o. given his encephalopathy so will Replete with IV K-Phos 10 normal -Continue to Monitor and Replete as Necessary -Repeat Phos Level  in AM   Hypokalemia -K+ was 3.3 -Attempted to replete with KCl 40 mg p.o. twice daily x2 doses however since patient is encephalopathic second secondary to elevated menstruation we will replete with Replete with IV KCl 40 mEQ and IV KPhos 10 mmol -Continue to Monitor and replete as necessary -P CMP in the morning  Hyperbilirubinemia  -T Bili was 2.1 and improved to 1.0 -Continue to Monitor  -Repeat CMP in AM and if worsening will check LDH and RUQ U/S  AKI, improved   -Patient's BUN/Cr went from 9/0.98 -> 14/1.33 -> 8/0.94 -> <5/0.93 -> <5/0.87 -Held Nephrotoxic Medications but will restart Lisinopril but discontinue HCTZ -Repeat CMP in AM   Wheezing, improved -Started DuoNebs; has some upper airway wheezing noted on examination -C/w Guaifenesin  -CXR yesterday AM showed The cardiomediastinal silhouette is stable. No pneumothorax. No nodules or masses. No focal infiltrates.  DVT prophylaxis: Heparin 5,000 units sq q8h given AKI Code Status: FULL CODE Family Communication: No family present at bedside Disposition Plan: Transferred to telemetry; Pending CIR vs. SNF; Discussed with inpatient rehab coordinator as well as social worker today and patient has to be out of restraints and off of IV sedation for at least 24 hours..  Consultants:   Neurology Dr. Otelia Limes  Inpatient Rehab Dr. Riley Kill   Procedures:  EEG  Description: The patient is awake but mostly drowsy during the recording.  During maximal wakefulness,  there is a symmetric, medium voltage 8 Hz posterior dominant rhythm that attenuates with eye opening. This is admixed with a mild amount of diffuse 4-5 Hz theta and 2-3 Hz delta slowing of the waking background.  During drowsiness, there is an increase in theta slowing of the background.  Stage 2 sleep not seen.  There were no epileptiform discharges or electrographic seizures seen.    EKG lead was unremarkable.  Impression: This awake and drowsy EEG is abnormal due to  mild diffuse slowing of the waking background.  Clinical Correlation of the above findings indicates diffuse cerebral dysfunction that is non-specific in etiology and can be seen with hypoxic/ischemic injury, toxic/metabolic encephalopathies, neurodegenerative disorders, or medication effect.  The absence of epileptiform discharges does not rule out a clinical diagnosis of epilepsy.  Clinical correlation is advised.   Antimicrobials:  Anti-infectives (From admission, onward)   None     Subjective: Seen and examined at bedside this a.m. and he was sitting in a chair.  He is somnolent secondary to Ativan administration overnight.  Very difficult to arouse and does open eyes but falls right back asleep.  Overnight nurse gave him IV lorazepam because of his agitation and patient end up back in restraints.  Daytime nurse has removed the restraints and I discontinued the CIWA protocol.  Objective: Vitals:   07/10/17 0735 07/10/17 0846 07/10/17 1130 07/10/17 1215  BP:  (!) 157/90  115/86  Pulse:  83  69  Resp:    (!) 22  Temp: (!) 97.5 F (36.4 C)  (!) 97.5 F (36.4 C)   TempSrc: Oral     SpO2:  98%  98%  Weight:      Height:        Intake/Output Summary (Last 24 hours) at 07/10/2017 1624 Last data filed at 07/10/2017 1300 Gross per 24 hour  Intake 300 ml  Output 1575 ml  Net -1275 ml   Filed Weights   07/03/17 1957  Weight: 91.5 kg (201 lb 11.5 oz)   Examination: Physical Exam:  Constitutional: Well-nourished well-developed African-American male who is sitting in chair bedside currently in no acute distress however he is very somnolent and unable to participate in exam and evaluation due to IV abdomen administration Eyes: Sclera anicteric on the right eye.  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  Mucous membranes moist Neck: Supple with no JVD Respiratory: Diminished to Auscultation; Upper respiratory wheezing appreciated.  No appreciable rhonchi or  rales. Cardiovascular: RRR; No appreciable m/r/g. No LE Edema Abdomen: Soft, NT, ND, Bowel Sounds present  GU: Deferred Musculoskeletal: No contractures; No cyanoisis Skin: Warm and Dry; No appreciable rashes or lesions on a limited skin evaluation Neurologic: Very difficult to arouse patient did not participate in neurological examination due to somnolence from IV Ativan administration. Opens eye to deep sternal rub as Left eye is missing  Opens his eyes to deep sternal rub Psychiatric: Impaired judgment and insight secondary to Ativan administration.  Somnolent  Data Reviewed: I have personally reviewed following labs and imaging studies  CBC: Recent Labs  Lab 07/06/17 0538 07/07/17 0549 07/08/17 0304 07/09/17 0445 07/10/17 0400  WBC 9.2 8.6 8.7 7.4 8.0  NEUTROABS 6.6 5.3 5.2 4.2 4.6  HGB 12.2* 11.6* 11.4* 11.4* 11.7*  HCT 37.3* 35.5* 34.5* 34.3* 34.4*  MCV 92.6 93.7 91.0 90.7 89.8  PLT 208 192 212 222 235   Basic Metabolic Panel: Recent Labs  Lab 07/06/17 0538 07/07/17 0549 07/08/17 0304  07/09/17 0445 07/10/17 0400  NA 139 139 137 138 135  K 3.7 3.9 3.5 3.4* 3.3*  CL 107 108 106 102 100*  CO2 19* 22 23 26 25   GLUCOSE 169* 188* 187* 193* 206*  BUN 9 14 8  <5* <5*  CREATININE 0.98 1.33* 0.94 0.93 0.87  CALCIUM 8.6* 8.6* 8.7* 9.0 8.9  MG 2.1 2.0 1.7 1.7 1.8  PHOS 2.2* 3.2 2.9 2.8 2.4*   GFR: Estimated Creatinine Clearance: 88 mL/min (by C-G formula based on SCr of 0.87 mg/dL). Liver Function Tests: Recent Labs  Lab 07/06/17 0538 07/07/17 0549 07/08/17 0304 07/09/17 0445 07/10/17 0400  AST 29 21 24 24  32  ALT 14* 13* 14* 18 23  ALKPHOS 60 54 59 59 60  BILITOT 2.1* 1.1 0.8 0.9 1.0  PROT 6.5 6.0* 6.0* 6.4* 6.3*  ALBUMIN 3.5 3.1* 3.2* 3.2* 3.5   No results for input(s): LIPASE, AMYLASE in the last 168 hours. No results for input(s): AMMONIA in the last 168 hours. Coagulation Profile: Recent Labs  Lab 07/03/17 2003  INR 1.00   Cardiac Enzymes: Recent  Labs  Lab 07/04/17 0657  TROPONINI <0.03   BNP (last 3 results) No results for input(s): PROBNP in the last 8760 hours. HbA1C: No results for input(s): HGBA1C in the last 72 hours. CBG: Recent Labs  Lab 07/09/17 1618 07/09/17 2107 07/10/17 0527 07/10/17 0753 07/10/17 1212  GLUCAP 225* 218* 204* 200* 258*   Lipid Profile: No results for input(s): CHOL, HDL, LDLCALC, TRIG, CHOLHDL, LDLDIRECT in the last 72 hours. Thyroid Function Tests: No results for input(s): TSH, T4TOTAL, FREET4, T3FREE, THYROIDAB in the last 72 hours. Anemia Panel: No results for input(s): VITAMINB12, FOLATE, FERRITIN, TIBC, IRON, RETICCTPCT in the last 72 hours. Sepsis Labs: No results for input(s): PROCALCITON, LATICACIDVEN in the last 168 hours.  Recent Results (from the past 240 hour(s))  MRSA PCR Screening     Status: None   Collection Time: 07/03/17 11:08 PM  Result Value Ref Range Status   MRSA by PCR NEGATIVE NEGATIVE Final    Comment:        The GeneXpert MRSA Assay (FDA approved for NASAL specimens only), is one component of a comprehensive MRSA colonization surveillance program. It is not intended to diagnose MRSA infection nor to guide or monitor treatment for MRSA infections. Performed at [facility], [address] [place], [place] [address]   Culture, blood (routine x 2)     Status: None   Collection Time: 07/05/17  1:58 PM  Result Value Ref Range Status   Specimen Description BLOOD RIGHT ANTECUBITAL  Final   Special Requests   Final    BOTTLES DRAWN AEROBIC AND ANAEROBIC Blood Culture adequate volume   Culture   Final    NO GROWTH 5 DAYS Performed at [facility], [address] [place], [place] [address]    Report Status 07/10/2017 FINAL  Final  Culture, blood (routine x 2)     Status: None   Collection Time: 07/05/17  1:59 PM  Result Value Ref Range Status   Specimen Description BLOOD BLOOD RIGHT HAND  Final   Special Requests   Final    BOTTLES DRAWN  AEROBIC AND ANAEROBIC Blood Culture results may not be optimal due to an inadequate volume of blood received in culture bottles   Culture   Final    NO GROWTH 5 DAYS Performed at [facility], [address] [place], [place] [address]  Report Status 07/10/2017 FINAL  Final  Culture, Urine     Status: Abnormal   Collection Time: 07/06/17  8:13 AM  Result Value Ref Range Status   Specimen Description URINE, CLEAN CATCH  Final   Special Requests   Final    NONE Performed at [facility], [address] [place], [place] [address]    Culture MULTIPLE SPECIES PRESENT, SUGGEST RECOLLECTION (A)  Final   Report Status 07/07/2017 FINAL  Final    Radiology Studies: No results found. Scheduled Meds: . amLODipine  10 mg Oral Daily  . atorvastatin  80 mg Oral q1800  . carvedilol  6.25 mg Oral BID WC  . folic acid  1 mg Oral Daily  . guaiFENesin  1,200 mg Oral BID  . heparin injection (subcutaneous)  5,000 Units Subcutaneous Q8H  . insulin aspart  0-5 Units Subcutaneous QHS  . insulin aspart  0-9 Units Subcutaneous TID WC  . levETIRAcetam  750 mg Oral BID  . lisinopril  20 mg Oral Daily  . multivitamin with minerals  1 tablet Oral Daily  . phosphorus  500 mg Oral BID  . potassium chloride  40 mEq Oral BID  . thiamine  100 mg Oral Daily   Continuous Infusions:   LOS: 6 days   Merlene Laughter, DO [facility] Pager [phone]  If 7PM-7AM, please contact night-coverage [email or web address] Password [facility] 07/10/2017, 4:24 PM

## 2017-07-10 NOTE — Progress Notes (Signed)
Inpatient Rehabilitation  Discussed case with today's admitting Rehab MD, Dr. Allena KatzPatel.  In order to determine appropriateness for IP Rehab patient needs to be off of IV ativan with continued skilled therapy needs.  Will discuss with case manager.  Call if questions.   Charlane FerrettiMelissa Jolanta Cabeza, M.A., CCC/SLP Admission Coordinator  [facility]  Cell [phone]

## 2017-07-10 NOTE — Progress Notes (Signed)
Physical Therapy Treatment Patient Details Name: Richard JakschLeon [name] MRN: [id number] DOB: [birth year] Today's Date: 07/10/2017    History of Present Illness This 72 y.o. male admitted with Rt sided weakness, difficulty speaking, confusion.   CT and CTA of head were unremarkable.  Pt had seizure like activity while in ED.  Dx:  complex partial seizure and ETOH withdrawal.  PMH:  CAD,  V-Fib Arrest, DM, Lt frontal hemorrhage with Rt hemiparesis.    PT Comments    Pt with flat affect, eye open and very limited interaction and verbal responses to questions today. Pt unable to state name or place, focused attention throughout and not following single step commands for transfers or bil UE movement. Pt did receive ativan in PM and anticipate this is impacting cognition. Pt with increased assist for all mobility with decreased activity today. Will continue to follow and recommend CIR.     Follow Up Recommendations  CIR;Supervision/Assistance - 24 hour     Equipment Recommendations  Rolling walker with 5" wheels    Recommendations for Other Services       Precautions / Restrictions Precautions Precautions: Fall Precaution Comments: right hemiparesis Restrictions Weight Bearing Restrictions: No    Mobility  Bed Mobility Overal bed mobility: Needs Assistance Bed Mobility: Supine to Sit     Supine to sit: Mod assist;HOB elevated     General bed mobility comments: mod assist to move bil LE to EOB and elevate trunk. HOB 35degress with increased time and multimodal cues  Transfers Overall transfer level: Needs assistance   Transfers: Sit to/from Stand;Stand Pivot Transfers Sit to Stand: Mod assist;+2 physical assistance Stand pivot transfers: Mod assist;+2 physical assistance       General transfer comment: mod assist to initiate standing with pt then able to engage and rise after bil knees and feet blocked as pt kept crossing legs and lifting feet. In standing mod assist for balance with  bil UE supported with assist and cues to step to chair. Physical assist to bend hip to sit in chair  Ambulation/Gait             General Gait Details: unable to attempt due to cognition this session   Stairs             Wheelchair Mobility    Modified Rankin (Stroke Patients Only)       Balance Overall balance assessment: Needs assistance   Sitting balance-Leahy Scale: Poor       Standing balance-Leahy Scale: Poor                              Cognition Arousal/Alertness: Awake/alert Behavior During Therapy: Flat affect Overall Cognitive Status: Impaired/Different from baseline Area of Impairment: Attention;Following commands;Orientation                 Orientation Level: Time;Place;Situation Current Attention Level: Focused           General Comments: pt with decreased cognition, atttention, awareness and memory today. Pt stating "what do you want" "yeah" throughout session. He would not state name or follow command for bil UE movement throughout session. Of note pt was given Ativan and appears impaired due to medication      Exercises      General Comments        Pertinent Vitals/Pain Pain Assessment: No/denies pain    Home Living  Prior Function            PT Goals (current goals can now be found in the care plan section) Progress towards PT goals: Not progressing toward goals - comment(limited by cognition and attention)    Frequency           PT Plan Current plan remains appropriate    Co-evaluation              AM-PAC PT "6 Clicks" Daily Activity  Outcome Measure  Difficulty turning over in bed (including adjusting bedclothes, sheets and blankets)?: A Lot Difficulty moving from lying on back to sitting on the side of the bed? : Unable Difficulty sitting down on and standing up from a chair with arms (e.g., wheelchair, bedside commode, etc,.)?: Unable Help needed moving  to and from a bed to chair (including a wheelchair)?: A Lot Help needed walking in hospital room?: Total Help needed climbing 3-5 steps with a railing? : Total 6 Click Score: 8    End of Session Equipment Utilized During Treatment: Gait belt Activity Tolerance: Patient tolerated treatment well Patient left: in chair;with call bell/phone within reach;with chair alarm set;with nursing/sitter in room;with restraints reapplied Nurse Communication: Mobility status;Precautions PT Visit Diagnosis: Unsteadiness on feet (R26.81);Muscle weakness (generalized) (M62.81);Other symptoms and signs involving the nervous system (Z61.096)     Time: 0454-0981 PT Time Calculation (min) (ACUTE ONLY): 17 min  Charges:  $Therapeutic Activity: 8-22 mins                    G Codes:       Delaney Meigs, PT [phone]    Arrion Burruel B Jaydien Panepinto 07/10/2017, 10:33 AM

## 2017-07-10 NOTE — Progress Notes (Addendum)
Inpatient Diabetes Program Recommendations  AACE/ADA: New Consensus Statement on Inpatient Glycemic Control (2015)  Target Ranges:  Prepandial:   less than 140 mg/dL      Peak postprandial:   less than 180 mg/dL (1-2 hours)      Critically ill patients:  140 - 180 mg/dL   Lab Results  Component Value Date   GLUCAP 200 (H) 07/10/2017   HGBA1C 6.3 (H) 07/05/2017    Review of Glycemic Control Results for Richard [name], Richard [name] (MRN [id number]) as of 07/10/2017 09:39  Ref. Range 07/09/2017 11:20 07/09/2017 16:18 07/09/2017 21:07 07/10/2017 05:27 07/10/2017 07:53  Glucose-Capillary Latest Ref Range: 65 - 99 mg/dL 409309 (H) 811225 (H) 914218 (H) 204 (H) 200 (H)   Diabetes history: Type 2 DM Outpatient Diabetes medications: NPH- 40 units in AM, 30 units QPM Current orders for Inpatient glycemic control: Novolog 0-9 units TID, Novolog 0-5 units QHS  Inpatient Diabetes Program Recommendations:    Consider starting Lantus 10 units QHS (90.9 kg x .15).   Noted A1C and home regimen of NPH (40 units in AM, 30 units in PM), makes me question if patient was having recurrent episodes of hypoglycemia when compared to inpatient regimen. Noted awaiting discharge placement. Would be hesitant to discharge home on home regimen, as this seems like too much insulin.   Thanks, Lujean RaveLauren Amala Petion, MSN, RNC-OB Diabetes Coordinator [phone] (8a-5p)

## 2017-07-11 LAB — CBC WITH DIFFERENTIAL/PLATELET
BASOS PCT: 0 %
Basophils Absolute: 0 10*3/uL (ref 0.0–0.1)
EOS ABS: 0.4 10*3/uL (ref 0.0–0.7)
Eosinophils Relative: 5 %
HCT: 36.7 % — ABNORMAL LOW (ref 39.0–52.0)
HEMOGLOBIN: 12.1 g/dL — AB (ref 13.0–17.0)
LYMPHS ABS: 2.6 10*3/uL (ref 0.7–4.0)
Lymphocytes Relative: 33 %
MCH: 29.8 pg (ref 26.0–34.0)
MCHC: 33 g/dL (ref 30.0–36.0)
MCV: 90.4 fL (ref 78.0–100.0)
Monocytes Absolute: 0.8 10*3/uL (ref 0.1–1.0)
Monocytes Relative: 10 %
NEUTROS PCT: 52 %
Neutro Abs: 4 10*3/uL (ref 1.7–7.7)
Platelets: 237 10*3/uL (ref 150–400)
RBC: 4.06 MIL/uL — ABNORMAL LOW (ref 4.22–5.81)
RDW: 12 % (ref 11.5–15.5)
WBC: 7.8 10*3/uL (ref 4.0–10.5)

## 2017-07-11 LAB — COMPREHENSIVE METABOLIC PANEL
ALBUMIN: 3.4 g/dL — AB (ref 3.5–5.0)
ALK PHOS: 62 U/L (ref 38–126)
ALT: 30 U/L (ref 17–63)
AST: 48 U/L — ABNORMAL HIGH (ref 15–41)
Anion gap: 9 (ref 5–15)
BUN: 6 mg/dL (ref 6–20)
CALCIUM: 9.1 mg/dL (ref 8.9–10.3)
CO2: 28 mmol/L (ref 22–32)
CREATININE: 1.01 mg/dL (ref 0.61–1.24)
Chloride: 101 mmol/L (ref 101–111)
GFR calc Af Amer: >60 mL/min (ref >60)
GFR calc non Af Amer: >60 mL/min (ref >60)
GLUCOSE: 185 mg/dL — AB (ref 65–99)
Potassium: 4.2 mmol/L (ref 3.5–5.1)
SODIUM: 138 mmol/L (ref 135–145)
Total Bilirubin: 1.1 mg/dL (ref 0.3–1.2)
Total Protein: 6.3 g/dL — ABNORMAL LOW (ref 6.5–8.1)

## 2017-07-11 LAB — GLUCOSE, CAPILLARY
GLUCOSE-CAPILLARY: 174 mg/dL — AB (ref 65–99)
GLUCOSE-CAPILLARY: 242 mg/dL — AB (ref 65–99)
Glucose-Capillary: 194 mg/dL — ABNORMAL HIGH (ref 65–99)
Glucose-Capillary: 267 mg/dL — ABNORMAL HIGH (ref 65–99)
Glucose-Capillary: 80 mg/dL (ref 65–99)

## 2017-07-11 LAB — MAGNESIUM: Magnesium: 2.1 mg/dL (ref 1.7–2.4)

## 2017-07-11 LAB — PHOSPHORUS: Phosphorus: 3.9 mg/dL (ref 2.5–4.6)

## 2017-07-11 NOTE — Progress Notes (Signed)
Inpatient Rehabilitation  Note that patient is off of IV Ativan today.  Have discussed with team and will plan to open this afternoon after updated PT, OT notes.  Met with patient at bedside to discuss team's recommendation for IP Rehab.  Shared booklets and answered questions.  Plan to follow up with spouse via phone.  Call if questions.     , M.A., CCC/SLP Admission Coordinator  [facility]  Cell [phone]  

## 2017-07-11 NOTE — Progress Notes (Signed)
PROGRESS NOTE    Richard JakschLeon Courtney  NWG:[id number] DOB: 1945/08/11 DOA: 07/03/2017 PCP: Elias Elseeade, Robert, MD   Brief Narrative:  Richard [name] is a 72 y.o. 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 CT head and CTA head/neck were unremarkable and the patient had evident seizure-like activity while in the ED. Neurology was consulted, seizure terminated with ativan and keppra load. He is currently being treated for new complex partial seizure and EtOH withdrawal. CIWA was elevated and currently on SDU withdrawal protocol.   He steadily improved and Restraints were removed and MRI done 07/07/17. Awaiting CIR Placement vs. SNF.  Unfortunately patient became agitated overnight 4/14-4/15 and ended up back in restraints and received Ativan during the early hours.  This morning around to go evaluate him, he was somnolent with difficulty arousing secondary to the abdomen administration.  After discussion with the daytime nurse she is told me that his see was score was 18 and he was administered Ativan and put in restraints.  I told her to remove the restraints and observe and patient has done well.  I spoke to the Inpatient Rehab Admissions Coordinator today and per her recommendation she wants updated Physical therapy and Occupational Therapy notes.  T is seen the patient and continue to recommend CIR at this time.  Physical therapy is still to evaluate the patient today.  Once patient's physical therapy progress note has been updated, the rehab admissions coordinator will fax off to the insurance company for insurance authorization.  [facility] insurance authorization and if he does not qualify for an insurance authorization stay I have asked the social worker as a backup to look for some  skilled nursing facilities.  Assessment & Plan:   Principal Problem:   Seizures (HCC) Active Problems:   Essential hypertension   CAD (coronary artery disease)   Alcohol abuse   Acute encephalopathy   Seizure (HCC)  New-onset complex Partial Seizure  -Suspect this is in the setting of left parietal encephalomalacia and possible alcohol withdrawal. -Neurology following and appreciated reccs -EEG done and showed diffuse slowing of the waking background. Per Neuro Dr. Otelia LimesLindzen No electrographic seizures seen; however, the diffuse slowing may be a postictal phenomenon -Continue IV Keppra 750mg  BID following load; Neuro recommending it at D/C and will changed to po this AM -Continue to Monitor neuro status in SDU -Soft restraints only as needed but discontinued.  -MRI done and showed Partial and motion degraded exam without acute finding. Remote posterior left frontal hemorrhage. 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 Extensive chronic small vessel ischemia and generalized atrophy. -Follow up with Neurology as an outpatient  -Recommendations per Neurology Dr. Otelia LimesLindzen; has no further recommendations  -PT/OT evaluated and recommending CIR; CIR MD evaluated and felt he is may be a candidate for Rehab and awaiting updated notes -Will Have Social Worker Weigh In case patient is unable to go to CIR, may need SNF placement. Social worker aware. And patient has to be out of restraints and off of IV sedation for over 24 hours.  Alcohol Withdrawal, improved -Discontinued CIWA protocol -D/C'dLorazepam 2-3 mg IV q1hprn EtOH withdrawal Based on CIWA Score -Soft Restraints removedagain -C/w Folic Acid, MVI, and Thiamine  Agitation -Received IV Ativan Last on 4/14-4/15 and got put back in Restraints -CIWA Protocolcontinued as patient  has been lucid for the last 2 days and likely had some acute delirium and sundowning which was mistaken for alcohol withdrawal prompting the  nurse to give the patient IV Ativan. -We will discontinue the IV AtivanCIWAprotocol and remove the patient'srestraints in anticipation for the patient being discharged to a skilled nursing facility versus inpatient rehabilitation -Patient has been much improved  CAD status post stenting in the setting of ST elevation MI in March 2017 -SLP recommended Dysphagia 3 Diet with Thin Liquids with Full Staff Supervision for use of Swallowing Precautions -Will have SLP re-evaluate and recommending continuing current plan of care -ResumedHome Medications of Atorvastatin 80 mg po Daily, Carvedilol 6.25 mg po BID and NTG 0.4 mg SL NTG -Resumed Lisinopril 20 mg po Daily, but will hold hydrochlorothiazide at this time.  Hypertension -BPthis Afternoon was 118/75 -Continue with IV Hydralazine 10 mg q4hprn for parameters of now SBP >180 or DBP >100 as restarting home Meds -Resume Home Carvedilol and Home Amlodipine 10 mg po daily  -Held Lisinopril-HCTZ 20-12.5 mg po daily initially, but resumed Lisinopril but will discontinue HCTZ  History of intracranial hemorrhage in the setting of marked hypertension.  -Resume Home BP medications as above -Resume Home Atorvastatin 80 mg po Daily   Diabetes Mellitus Type 2 -Patient takes NPH insulin and sugars were ranging in the lower range -Change Sensitive Novolog SSI q4h to Sensitive Novolog SSI AC/HS -CBG's ranging from  171-267; Continue to Monitor CBG's closely  -Checked Hemoglobin A1c and was 6.3  Hypokalemia  -Patient's K+ was 3.4 this AM -Replete with potassium chloride 40 mg p.o. twice daily x2 doses -Continue to Monitor and Replete as Necessary -Repeat CMP in AM   Leukocytosis, improved -Mild and went from 9.8 -> 10.7 ->9.2 ->8.6 ->8.7 ->7.4 -> 8.0 -> 7.8 -Given Patient's Temperature the night before last was pan-cultured -Blood Cx x2 show NGTD at5days -Urinalysis never collected initally; Urine Cx re-sent and showed Multiple  Species Present,  -CXR showed No active disease. No evidence of pneumonia or pulmonary edema.  Normocytic Anemia -Patient's Hb/Hct Stable at12.1/36.7 -Continue to Monitor for S/Sx of Bleeding -Repeat CBC in AM   Hypomagnesemia -Patient's Mag Level was 1.5 and improved to 2.1 -Continue to Monitor and Replete as Necessary -Repeat Mag Level in AM   Hypophosphatemia  -Patient's Phos was 2.4 and improved to 3.9 -Attempted to replete with K-Phos 500 mg p.o. twice daily x2 doses yesterday however patient is not taking p.o. given his encephalopathysoRepletewith IV K-Phos 10 normal -Continue to Monitor and Replete as Necessary -Repeat Phos Level at SNF  Hypokalemia -K+ was 3.3 and improved to 4.2 -Attempted to replete with KCl 40 mg p.o. twice daily x2 doses yesterday however since patient is encephalopathic second secondary to elevated menstruation we will replete withReplete with IV KCl 40 mEQ and IV KPhos 10 mmol -Continue to Monitorand replete as necessary -Repeat CMP at SNF  Hyperbilirubinemia  -T Bili was 2.1 and improved to1.1 -Continue to Monitor  -Repeat CMP in AM and if worsening will check LDH and RUQ U/S  AKI, improved  -Patient's BUN/Cr went from 9/0.98 -> 14/1.33 -> 8/0.94 -> <5/0.93-><5/0.87 -> 6/1.01 -Held Nephrotoxic Medications but will restart Lisinopril but discontinue HCTZ -Repeat CMP in AM   Wheezing, improved -Started DuoNebs;has some upper airway wheezing noted on examination -C/w Guaifenesin  -CXR showed The cardiomediastinal silhouette is stable. No pneumothorax. No nodules or masses. No focal infiltrates.  DVT prophylaxis: Heparin 5,000 units sq q8h given AKI Code Status: FULL  CODE Family Communication: No family present at bedside Disposition Plan: Pending CIR versus SNF  Consultants:   Neurology Dr. Otelia Limes  Inpatient Rehab Dr. Riley Kill   Procedures:  EEG  Description: The patient is awake but mostly drowsy during the  recording.  During maximal wakefulness, there is a symmetric, medium voltage 8 Hz posterior dominant rhythm that attenuates with eye opening. This is admixed with a mild amount of diffuse 4-5 Hz theta and 2-3 Hz delta slowing of the waking background.  During drowsiness, there is an increase in theta slowing of the background.  Stage 2 sleep not seen.  There were no epileptiform discharges or electrographic seizures seen.    EKG lead was unremarkable.  Impression: This awake and drowsy EEG is abnormal due to mild diffuse slowing of the waking background.  Clinical Correlation of the above findings indicates diffuse cerebral dysfunction that is non-specific in etiology and can be seen with hypoxic/ischemic injury, toxic/metabolic encephalopathies, neurodegenerative disorders, or medication effect.  The absence of epileptiform discharges does not rule out a clinical diagnosis of epilepsy.  Clinical correlation is advised.   Antimicrobials:  Anti-infectives (From admission, onward)   None     Subjective: Seen and examined at bedside in a.m. and is very pleasant.  He is not in restraints and did not receive any IV Ativan overnight.  He had no complaints.  Denies any chest pain, shortness breath, nausea, vomiting.  Asking about when he can leave the hospital.  Objective: Vitals:   07/11/17 0800 07/11/17 0948 07/11/17 1140 07/11/17 1212  BP: 121/68  (!) 128/91 118/75  Pulse: 65  78 72  Resp: 12  18 18   Temp: 98.2 F (36.8 C) 97.9 F (36.6 C) 98.4 F (36.9 C) (!) 97.5 F (36.4 C)  TempSrc: Oral  Oral Oral  SpO2: 96%  95% 98%  Weight:      Height:        Intake/Output Summary (Last 24 hours) at 07/11/2017 1436 Last data filed at 07/11/2017 0941 Gross per 24 hour  Intake 120 ml  Output 1050 ml  Net -930 ml   Filed Weights   07/03/17 1957  Weight: 91.5 kg (201 lb 11.5 oz)   Examination: Physical Exam:  Constitutional: She is a well-nourished, well-developed [place] American  male currently in no acute distress appears very calm and comfortable. 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  Mucous membranes moist.  Grossly normal hearing Neck: Supple with no JVD Respiratory: Diminished to auscultation.  However patient had unlabored breathing with no appreciable wheezing, rales, rhonchi. Cardiovascular: Regular rate and rhythm.  No appreciable murmurs, rubs, gallops.  No lower extremity edema noted. Abdomen: Soft, nontender, nondistended.  Bowel sounds present in all 4 quadrants GU: Deferred Musculoskeletal: No contractures, no cyanosis. Skin: Warm and dry, with no appreciable rashes or lesions noted on a limited skin evaluation Neurologic: All extremities independently.  Has some mild residual deficits from prior CVA Psychiatric: Pleasant mood and affect.  Awake and alert  Data Reviewed: I have personally reviewed following labs and imaging studies  CBC: Recent Labs  Lab 07/07/17 0549 07/08/17 0304 07/09/17 0445 07/10/17 0400 07/11/17 0359  WBC 8.6 8.7 7.4 8.0 7.8  NEUTROABS 5.3 5.2 4.2 4.6 4.0  HGB 11.6* 11.4* 11.4* 11.7* 12.1*  HCT 35.5* 34.5* 34.3* 34.4* 36.7*  MCV 93.7 91.0 90.7 89.8 90.4  PLT 192 212 222 235 237   Basic Metabolic Panel: Recent  Labs  Lab 07/07/17 0549 07/08/17 0304 07/09/17 0445 07/10/17 0400 07/11/17 0359  NA 139 137 138 135 138  K 3.9 3.5 3.4* 3.3* 4.2  CL 108 106 102 100* 101  CO2 22 23 26 25 28   GLUCOSE 188* 187* 193* 206* 185*  BUN 14 8 <5* <5* 6  CREATININE 1.33* 0.94 0.93 0.87 1.01  CALCIUM 8.6* 8.7* 9.0 8.9 9.1  MG 2.0 1.7 1.7 1.8 2.1  PHOS 3.2 2.9 2.8 2.4* 3.9   GFR: Estimated Creatinine Clearance: 75.8 mL/min (by C-G formula based on SCr of 1.01 mg/dL). Liver Function Tests: Recent Labs  Lab 07/07/17 0549 07/08/17 0304 07/09/17 0445 07/10/17 0400 07/11/17 0359  AST 21 24 24  32 48*  ALT 13* 14* 18 23 30   ALKPHOS 54 59 59 60 62  BILITOT 1.1  0.8 0.9 1.0 1.1  PROT 6.0* 6.0* 6.4* 6.3* 6.3*  ALBUMIN 3.1* 3.2* 3.2* 3.5 3.4*   No results for input(s): LIPASE, AMYLASE in the last 168 hours. No results for input(s): AMMONIA in the last 168 hours. Coagulation Profile: No results for input(s): INR, PROTIME in the last 168 hours. Cardiac Enzymes: No results for input(s): CKTOTAL, CKMB, CKMBINDEX, TROPONINI in the last 168 hours. BNP (last 3 results) No results for input(s): PROBNP in the last 8760 hours. HbA1C: No results for input(s): HGBA1C in the last 72 hours. CBG: Recent Labs  Lab 07/10/17 1725 07/10/17 2112 07/11/17 0449 07/11/17 0820 07/11/17 1137  GLUCAP 171* 171* 194* 174* 267*   Lipid Profile: No results for input(s): CHOL, HDL, LDLCALC, TRIG, CHOLHDL, LDLDIRECT in the last 72 hours. Thyroid Function Tests: No results for input(s): TSH, T4TOTAL, FREET4, T3FREE, THYROIDAB in the last 72 hours. Anemia Panel: No results for input(s): VITAMINB12, FOLATE, FERRITIN, TIBC, IRON, RETICCTPCT in the last 72 hours. Sepsis Labs: No results for input(s): PROCALCITON, LATICACIDVEN in the last 168 hours.  Recent Results (from the past 240 hour(s))  MRSA PCR Screening     Status: None   Collection Time: 07/03/17 11:08 PM  Result Value Ref Range Status   MRSA by PCR NEGATIVE NEGATIVE Final    Comment:        The GeneXpert MRSA Assay (FDA approved for NASAL specimens only), is one component of a comprehensive MRSA colonization surveillance program. It is not intended to diagnose MRSA infection nor to guide or monitor treatment for MRSA infections. Performed at [facility], [address] [place], [place] [address]   Culture, blood (routine x 2)     Status: None   Collection Time: 07/05/17  1:58 PM  Result Value Ref Range Status   Specimen Description BLOOD RIGHT ANTECUBITAL  Final   Special Requests   Final    BOTTLES DRAWN AEROBIC AND ANAEROBIC Blood Culture adequate volume   Culture   Final    NO GROWTH 5  DAYS Performed at [facility], [address], [place], [place] [address]    Report Status 07/10/2017 FINAL  Final  Culture, blood (routine x 2)     Status: None   Collection Time: 07/05/17  1:59 PM  Result Value Ref Range Status   Specimen Description BLOOD BLOOD RIGHT HAND  Final   Special Requests   Final    BOTTLES DRAWN AEROBIC AND ANAEROBIC Blood Culture results may not be optimal due to an inadequate volume of blood received in culture bottles   Culture   Final    NO GROWTH 5 DAYS Performed at [facility], [address], [place], [place] [address]    Report Status 07/10/2017 FINAL  Final  Culture, Urine     Status: Abnormal   Collection Time: 07/06/17  8:13 AM  Result Value Ref Range Status   Specimen Description URINE, CLEAN CATCH  Final   Special Requests   Final    NONE Performed at [facility], [address], [place], [place] [address]    Culture MULTIPLE SPECIES PRESENT, SUGGEST RECOLLECTION (A)  Final   Report Status 07/07/2017 FINAL  Final    Radiology Studies: No results found. Scheduled Meds: . amLODipine  10 mg Oral Daily  . atorvastatin  80 mg Oral q1800  . carvedilol  6.25 mg Oral BID WC  . folic acid  1 mg Oral Daily  . guaiFENesin  1,200 mg Oral BID  . heparin injection (subcutaneous)  5,000 Units Subcutaneous Q8H  . insulin aspart  0-5 Units Subcutaneous QHS  . insulin aspart  0-9 Units Subcutaneous TID WC  . levETIRAcetam  750 mg Oral BID  . lisinopril  20 mg Oral Daily  . multivitamin with minerals  1 tablet Oral Daily  . phosphorus  500 mg Oral BID  . thiamine  100 mg Oral Daily   Continuous Infusions:   LOS: 7 days   Merlene Laughter, DO [facility] Pager [phone]  If 7PM-7AM, please contact night-coverage [email or web address] Password [facility] 07/11/2017, 2:36 PM

## 2017-07-11 NOTE — Progress Notes (Signed)
Occupational Therapy Treatment Patient Details Name: Richard [name] MRN: [id number] DOB: [birth year] Today's Date: 07/11/2017    History of present illness This 72 y.o. male admitted with Rt sided weakness, difficulty speaking, confusion.   CT and CTA of head were unremarkable.  Pt had seizure like activity while in ED.  Dx:  complex partial seizure and ETOH withdrawal.  PMH:  CAD,  V-Fib Arrest, DM, Lt frontal hemorrhage with Rt hemiparesis.   OT comments  Pt is progressing with OT.  He requires mod A for simple grooming activities due to organizational and sequencing deficits.  He requires min A for standing balance, and mod A to ambulate in small spaces such as bathroom and when turning in room.  He is at very high risk for fall and injury.  Due to his language deficits coupled with cognitive and balance deficits feel he would be best served in an intensive rehab center who specialize in stroke as he will likely not receive that level of comprehensive services at SNF.   He has very strong family support - recommend CIR.    Follow Up Recommendations  CIR;Supervision/Assistance - 24 hour    Equipment Recommendations  None recommended by OT    Recommendations for Other Services Rehab consult    Precautions / Restrictions Precautions Precautions: Fall       Mobility Bed Mobility Overal bed mobility: Needs Assistance Bed Mobility: Supine to Sit     Supine to sit: Supervision        Transfers Overall transfer level: Needs assistance   Transfers: Sit to/from Stand;Stand Pivot Transfers Sit to Stand: Min assist Stand pivot transfers: Min assist       General transfer comment: assist for balance     Balance Overall balance assessment: Needs assistance Sitting-balance support: Feet supported Sitting balance-Leahy Scale: Fair Sitting balance - Comments: Pt able to maintain static sitting with min guard assist.  Mod A for dynamic sitting    Standing balance support: No upper  extremity supported;During functional activity Standing balance-Leahy Scale: Poor Standing balance comment: requires min A                            ADL either performed or assessed with clinical judgement   ADL       Grooming: Oral care;Wash/dry face;Moderate assistance;Standing Grooming Details (indicate cue type and reason): Pt requires min A to maintain standing balance.  He pulled on faucet x 3 attempting to turn on water and required hand over hand guidance to pull the lever to turn the water on.  He required assist to remove toothpaste top, assist to rinse mouth, and assist to turn water off.               Lower Body Dressing: Moderate assistance;Sit to/from stand Lower Body Dressing Details (indicate cue type and reason): Pt donned sock with mod A for balance.  He lost balance fully to the Lt with no balance reaction              Functional mobility during ADLs: Moderate assistance;Rolling walker General ADL Comments: Pt required cueing to stayin inside the walker.  He ran into the door on the right, requiring mod A to maneuver around the obstacle, and required mod A to maneuver RW in bathroom and to back toward chair      Vision       Perception     Praxis  Cognition Arousal/Alertness: Awake/alert Behavior During Therapy: WFL for tasks assessed/performed Overall Cognitive Status: Impaired/Different from baseline Area of Impairment: Problem solving;Attention                   Current Attention Level: Selective         Problem Solving: Difficulty sequencing;Requires verbal cues;Requires tactile cues General Comments: Cognition difficult to assess due to communication deficits.  Pt required mod cues/assist for sequencing during grooming tasks         Exercises     Shoulder Instructions       General Comments      Pertinent Vitals/ Pain       Pain Assessment: No/denies pain  Home Living                                           Prior Functioning/Environment              Frequency  Min 2X/week        Progress Toward Goals  OT Goals(current goals can now be found in the care plan section)  Progress towards OT goals: Progressing toward goals     Plan Discharge plan remains appropriate    Co-evaluation                 AM-PAC PT "6 Clicks" Daily Activity     Outcome Measure   Help from another person eating meals?: Total Help from another person taking care of personal grooming?: A Lot Help from another person toileting, which includes using toliet, bedpan, or urinal?: A Lot Help from another person bathing (including washing, rinsing, drying)?: A Lot Help from another person to put on and taking off regular upper body clothing?: A Lot Help from another person to put on and taking off regular lower body clothing?: A Lot 6 Click Score: 11    End of Session Equipment Utilized During Treatment: Rolling walker;Gait belt  OT Visit Diagnosis: Unsteadiness on feet (R26.81)   Activity Tolerance Patient tolerated treatment well   Patient Left in chair;with call bell/phone within reach;with chair alarm set   Nurse Communication Mobility status        Time: [id number] OT Time Calculation (min): 35 min  Charges: OT General Charges $OT Visit: 1 Visit OT Treatments $Self Care/Home Management : 23-37 mins  Reynolds AmericanWendi Teddrick Mallari, OTR/L [phone]    Jeani HawkingConarpe, Marlin Brys M 07/11/2017, 1:39 PM

## 2017-07-11 NOTE — Progress Notes (Signed)
Physical Therapy Treatment Patient Details Name: Richard [name] MRN: [id number] DOB: September 07, 1945 Today's Date: 07/11/2017    History of Present Illness This 72 y.o. male admitted with Rt sided weakness, difficulty speaking, confusion.   CT and CTA of head were unremarkable.  Pt had seizure like activity while in ED.  Dx:  complex partial seizure and ETOH withdrawal.  PMH:  CAD,  V-Fib Arrest, DM, Lt frontal hemorrhage with Rt hemiparesis.    PT Comments    Pt  ambulated with mod A with RW with decreased awareness of obstacles and tendency to run into objects on R side. Pt impulsive when cued to turn and often loses balance when he moves quickly. Would benefit from CIR level rehab. PT will continue to follow.   Follow Up Recommendations  CIR;Supervision/Assistance - 24 hour     Equipment Recommendations  Rolling walker with 5" wheels    Recommendations for Other Services Rehab consult     Precautions / Restrictions Precautions Precautions: Fall Precaution Comments: right hemiparesis Restrictions Weight Bearing Restrictions: No    Mobility  Bed Mobility Overal bed mobility: Needs Assistance Bed Mobility: Supine to Sit     Supine to sit: Supervision     General bed mobility comments: pt received in chair  Transfers Overall transfer level: Needs assistance Equipment used: Rolling walker (2 wheeled) Transfers: Sit to/from Stand Sit to Stand: Min assist Stand pivot transfers: Min assist       General transfer comment: assist for balance and cues to grasp RW with R hand  Ambulation/Gait Ambulation/Gait assistance: Mod assist Ambulation Distance (Feet): 200 Feet Assistive device: Rolling walker (2 wheeled) Gait Pattern/deviations: Step-through pattern;Decreased stride length;Decreased weight shift to left Gait velocity: decreased Gait velocity interpretation: 1.31 - 2.62 ft/sec, indicative of limited community ambulator General Gait Details: needed vc's so as not to hit  objects on R side, stays very close to R wall, impulsive at times when cued to turn, quickly picks up RW and turns with partial LOB.    Stairs             Wheelchair Mobility    Modified Rankin (Stroke Patients Only)       Balance Overall balance assessment: Needs assistance Sitting-balance support: Feet supported Sitting balance-Leahy Scale: Fair Sitting balance - Comments: static sitting with supervision   Standing balance support: Bilateral upper extremity supported Standing balance-Leahy Scale: Poor Standing balance comment: able to stand with UE support and supervision but needs min A for dynamic motion                            Cognition Arousal/Alertness: Awake/alert Behavior During Therapy: WFL for tasks assessed/performed;Impulsive Overall Cognitive Status: Impaired/Different from baseline Area of Impairment: Problem solving;Attention;Safety/judgement                 Orientation Level: Time;Place;Situation Current Attention Level: Selective   Following Commands: Follows one step commands inconsistently;Follows one step commands with increased time Safety/Judgement: Decreased awareness of deficits;Decreased awareness of safety   Problem Solving: Difficulty sequencing;Requires verbal cues;Requires tactile cues General Comments: Cognition difficult to assess due to communication deficits.  Pt required mod cues/assist for safety during mobility       Exercises      General Comments General comments (skin integrity, edema, etc.): VSS. Returned to chair with chair alarm and posey belt      Pertinent Vitals/Pain Pain Assessment: No/denies pain    Home Living  Prior Function            PT Goals (current goals can now be found in the care plan section) Acute Rehab PT Goals Patient Stated Goal: unable to state PT Goal Formulation: Patient unable to participate in goal setting Time For Goal Achievement:  07/19/17 Potential to Achieve Goals: Good Progress towards PT goals: Progressing toward goals    Frequency    Min 3X/week      PT Plan Current plan remains appropriate    [name]-evaluation              AM-PAC PT "6 Clicks" Daily Activity  Outcome Measure  Difficulty turning over in bed (including adjusting bedclothes, sheets and blankets)?: A Lot Difficulty moving from lying on back to sitting on the side of the bed? : Unable Difficulty sitting down on and standing up from a chair with arms (e.g., wheelchair, bedside commode, etc,.)?: Unable Help needed moving to and from a bed to chair (including a wheelchair)?: A Lot Help needed walking in hospital room?: A Lot Help needed climbing 3-5 steps with a railing? : Total 6 Click Score: 9    End of Session Equipment Utilized During Treatment: Gait belt Activity Tolerance: Patient tolerated treatment well Patient left: in chair;with call bell/phone within reach;with chair alarm set;with restraints reapplied Nurse Communication: Mobility status PT Visit Diagnosis: Unsteadiness on feet (R26.81);Muscle weakness (generalized) (M62.81);Other symptoms and signs involving the nervous system (R29.898)     Time: 1610-9604 PT Time Calculation (min) (ACUTE ONLY): 21 min  Charges:  $Gait Training: 8-22 mins                    G Codes:       Richard [name], PT  Acute Rehab Services  [phone]    Richard [name] Richard [name] 07/11/2017, 2:48 PM

## 2017-07-12 DIAGNOSIS — I2510 Atherosclerotic heart disease of native coronary artery without angina pectoris: Secondary | ICD-10-CM

## 2017-07-12 LAB — GLUCOSE, CAPILLARY
GLUCOSE-CAPILLARY: 159 mg/dL — AB (ref 65–99)
GLUCOSE-CAPILLARY: 158 mg/dL — AB (ref 65–99)
GLUCOSE-CAPILLARY: 393 mg/dL — AB (ref 65–99)
Glucose-Capillary: 152 mg/dL — ABNORMAL HIGH (ref 65–99)
Glucose-Capillary: 268 mg/dL — ABNORMAL HIGH (ref 65–99)
Glucose-Capillary: 271 mg/dL — ABNORMAL HIGH (ref 65–99)
Glucose-Capillary: 287 mg/dL — ABNORMAL HIGH (ref 65–99)
Glucose-Capillary: 417 mg/dL — ABNORMAL HIGH (ref 65–99)
Glucose-Capillary: 84 mg/dL (ref 65–99)

## 2017-07-12 MED ORDER — INSULIN ASPART 100 UNIT/ML ~~LOC~~ SOLN
5.0000 [IU] | Freq: Once | SUBCUTANEOUS | Status: AC
  Administered 2017-07-12: 5 [IU] via SUBCUTANEOUS

## 2017-07-12 NOTE — Progress Notes (Signed)
CBG now reading 271. MD notified, new order enter to administer 5 units of Novolog once. Order carried out. Will continue to monitor.

## 2017-07-12 NOTE — Social Work (Addendum)
Pt has SNF options if [employer] will not approve CIR. Await information from CIR admission coordinator.   4:40pm- CSW spoke with CIR, [employer] has denied inpatient stay. CSW called pt wife, gave offers and stated this writer would leave more information at bedside if she would like it, pt wife states "do I have another option." when CSW asked what other facility or choice the pt wife was inquring about, pt wife states "I want him home." CSW spoke with pt wife about home health being less intensive than SNF and pt wife states "I can take care of him at home." CSW relayed that pt wife would hear from RN Case Manager, pt wife states "thanks for information" and hung up.   CSW gave this hand off to RN Case Manager who will f/u with pt wife in the morning.   Doy HutchingIsabel H Nels Munn, LCSWA [facility] Clinical Social Work [phone]

## 2017-07-12 NOTE — Progress Notes (Signed)
RN notified MD regarding patient not having IV access. Per MD, patient may go w/out IV access.

## 2017-07-12 NOTE — Progress Notes (Signed)
MD notified of CBG 417, RN asked to recheck and notify MD with results. CBG rechecked, new reading 393. Per MD, RN asked to admin 9units and recheck in 1hr.

## 2017-07-12 NOTE — Progress Notes (Signed)
Inpatient Rehabilitation  I await decision from [employer] on coverage for IP Rehab.  I have called and discussed our program with Mrs. Huckeba, who is familiar with our program due to her spouse's admission here in 2016 and updated her on current status.  She is aware that with a diagnosis of seizures IP Rehab may not be approved.  Plan to follow up with her and team when I have a decision.  Call if questions.   Charlane FerrettiMelissa Santosha Jividen, M.A., CCC/SLP Admission Coordinator  [facility]  Cell [phone]

## 2017-07-12 NOTE — Progress Notes (Signed)
Insurance has denied admit to CIR. Wife was notified by phone and Dr. Allena KatzPatel feels there are no grounds for appeal. SW, Isabell, made aware. We will sign off at this time. [phone]

## 2017-07-12 NOTE — Progress Notes (Signed)
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 CT head and CTA head/neck were unremarkable and the patient had evident seizure-like activity while in the ED. Neurology was consulted, seizure terminated with ativan and keppra load.He is currently being treated for new complex partial seizure and EtOH withdrawal. CIWA was elevated and currently on SDU withdrawal protocol.   He steadily improved and restraints were removed and MRI done 07/07/17. Awaiting CIR placement vs. SNF.  Assessment & Plan: Principal Problem:   Seizures (HCC) Active Problems:   Essential hypertension   CAD (coronary artery disease)   Alcohol abuse   Acute encephalopathy   Seizure (HCC)  New-onset complex partial seizure: Suspect this is in the setting of left parietal encephalomalacia and possible alcohol withdrawal. EEG done and showed diffuse slowing of the waking background. Per Neuro Dr. Otelia Limes: No electrographic seizures seen; however, the diffuse slowing may be a postictal phenomenon - Neurology consulted: recommended keppra continued, outpatient neurology follow up.  - Per [facility] statutes, patients with seizures are not allowed to drive until  they have been seizure-free for six months. Use caution when using heavy equipment or power tools. Avoid working on ladders or at heights. Take showers instead of baths. Ensure the water temperature is not too high on the home water heater. Do not go swimming alone. When caring for infants or small children, sit down when holding, feeding, or changing them to minimize risk of injury to the child in the event you have a seizure. Also, Maintain  good sleep hygiene. Avoid alcohol. - MRI showed partial and motion degraded exam without acute finding. Remote posterior left frontal hemorrhage. 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 Extensive chronic small vessel ischemia and generalized atrophy. - PT/OT evaluated and recommending CIR  Alcohol withdrawal: Resolved. - Continue vitamins. Stable off CIWA.   Agitation: No longer requiring IV ativan or restraints.   CAD s/p PCI March 2017: No chest pain.  - Continue statin, BB, ACE, prn NTG  Hypertension - Improved with restart of home medications (still holding thiazide).  - Continue hydralazine IV prn SBP >180 or DBP >100  History of ICH with dysphagia:  - Resume home BP medications and statin. - Continue dysphagia 3 diet per SLP recommendations  T2DM: HbA1c 6.3%. Strongly suspect noncompliance with home regimen based on minimal need for insulin here.  - Hyperglycemic today, so add one time dose of novolog today and asked to make dysphagia diet also carb-modified.  - Continue SSI  Leukocytosis: Resolved. Blood cultures 4/10 negative.   Normocytic anemia: Hgb stable, no bleeding noted.  - Monitor intermittently  Hypokalemia: Resolved.  Hypomagnesemia: Resolved.   Hypophosphatemia : Improved with replacement.   Hyperbilirubinemia: Resolved.   DVT prophylaxis: Heparin Code Status: Full Family Communication: None at bedside Disposition Plan: CIR vs. SNF. Awaiting insurance authorization. MEDICALLY STABLE FOR DISPOSITION.  Consultants:   Neurology, Dr. Otelia Limes  Inpatient rehabilitation, Dr. Riley Kill  Procedures:   EEG:  EEG  Description: The patient is awakebut mostly drowsyduring the recording. During maximal wakefulness, there is a symmetric, medium voltage 8Hz  posterior dominant rhythm that attenuates with eye opening. This is admixed with a mildamount of diffuse 4-5 Hz theta and 2-3 Hz delta slowing  of the waking background.  During drowsiness, there is an increase in theta slowing of the background. Stage 2 sleep not seen. There were no epileptiform discharges or electrographic seizures seen.   EKG lead was unremarkable.  Impression: This awake anddrowsyEEG is abnormal due to milddiffuse slowing of the waking background.  Clinical Correlation of the above findings indicates diffuse cerebral dysfunction that is non-specific in etiology and can be seen with hypoxic/ischemic injury, toxic/metabolic encephalopathies, neurodegenerative disorders, or medication effect. The absence of epileptiform discharges does not rule out a clinical diagnosis of epilepsy. Clinical correlation is advised.  Subjective: No complaints, just wants to leave the hospital. Frustrated. No seizures, focal numbness or weakness. No agitation overnight.   Objective: Vitals:   07/12/17 0425 07/12/17 0800 07/12/17 1152 07/12/17 1200  BP: 124/81  133/81 138/80  Pulse: 78  72 68  Resp: 19   11  Temp: 98.1 F (36.7 C)  98.4 F (36.9 C) 98.6 F (37 C)  TempSrc: Oral  Oral Oral  SpO2: 95%  98% 98%  Weight:  91.5 kg (201 lb 11.5 oz)    Height:  6' 0.99" (1.854 m)      Intake/Output Summary (Last 24 hours) at 07/12/2017 1448 Last data filed at 07/12/2017 1414 Gross per 24 hour  Intake 480 ml  Output 925 ml  Net -445 ml   Filed Weights   07/03/17 1957 07/12/17 0800  Weight: 91.5 kg (201 lb 11.5 oz) 91.5 kg (201 lb 11.5 oz)    Gen: Elderly male in no distress  Eyes: Left enucleated. Pulm: Non-labored breathing. Clear to auscultation bilaterally.  CV: Regular rate and rhythm. No murmur, rub, or gallop. No JVD, no pedal edema. GI: Abdomen soft, non-tender, non-distended, with normoactive bowel sounds. No organomegaly or masses felt. Ext: Warm, no deformities Skin: No rashes, lesions no ulcers Neuro: Alert, +dysphasia Psych: Judgement and insight appear normal. Mood & affect appropriate.   Data Reviewed: I have personally  reviewed following labs and imaging studies  CBC: Recent Labs  Lab 07/07/17 0549 07/08/17 0304 07/09/17 0445 07/10/17 0400 07/11/17 0359  WBC 8.6 8.7 7.4 8.0 7.8  NEUTROABS 5.3 5.2 4.2 4.6 4.0  HGB 11.6* 11.4* 11.4* 11.7* 12.1*  HCT 35.5* 34.5* 34.3* 34.4* 36.7*  MCV 93.7 91.0 90.7 89.8 90.4  PLT 192 212 222 235 237   Basic Metabolic Panel: Recent Labs  Lab 07/07/17 0549 07/08/17 0304 07/09/17 0445 07/10/17 0400 07/11/17 0359  NA 139 137 138 135 138  K 3.9 3.5 3.4* 3.3* 4.2  CL 108 106 102 100* 101  CO2 22 23 26 25 28   GLUCOSE 188* 187* 193* 206* 185*  BUN 14 8 <5* <5* 6  CREATININE 1.33* 0.94 0.93 0.87 1.01  CALCIUM 8.6* 8.7* 9.0 8.9 9.1  MG 2.0 1.7 1.7 1.8 2.1  PHOS 3.2 2.9 2.8 2.4* 3.9   GFR: Estimated Creatinine Clearance: 75.8 mL/min (by C-G formula based on SCr of 1.01 mg/dL). Liver Function Tests: Recent Labs  Lab 07/07/17 0549 07/08/17 0304 07/09/17 0445 07/10/17 0400 07/11/17 0359  AST 21 24 24  32 48*  ALT 13* 14* 18 23 30   ALKPHOS 54 59 59 60 62  BILITOT 1.1 0.8 0.9 1.0 1.1  PROT 6.0* 6.0* 6.4* 6.3* 6.3*  ALBUMIN 3.1* 3.2* 3.2* 3.5 3.4*   No results for input(s): LIPASE, AMYLASE in the last 168 hours. No results for input(s): AMMONIA in the last 168 hours. Coagulation Profile: No results for input(s): INR, PROTIME in the last  168 hours. Cardiac Enzymes: No results for input(s): CKTOTAL, CKMB, CKMBINDEX, TROPONINI in the last 168 hours. BNP (last 3 results) No results for input(s): PROBNP in the last 8760 hours. HbA1C: No results for input(s): HGBA1C in the last 72 hours. CBG: Recent Labs  Lab 07/12/17 0757 07/12/17 1150 07/12/17 1159 07/12/17 1324 07/12/17 1326  GLUCAP 158* 417* 393* 287* 271*   Lipid Profile: No results for input(s): CHOL, HDL, LDLCALC, TRIG, CHOLHDL, LDLDIRECT in the last 72 hours. Thyroid Function Tests: No results for input(s): TSH, T4TOTAL, FREET4, T3FREE, THYROIDAB in the last 72 hours. Anemia Panel: No  results for input(s): VITAMINB12, FOLATE, FERRITIN, TIBC, IRON, RETICCTPCT in the last 72 hours. Urine analysis:    Component Value Date/Time   COLORURINE YELLOW 07/06/2017 0813   APPEARANCEUR CLEAR 07/06/2017 0813   LABSPEC 1.026 07/06/2017 0813   PHURINE 5.0 07/06/2017 0813   GLUCOSEU 150 (A) 07/06/2017 0813   HGBUR NEGATIVE 07/06/2017 0813   BILIRUBINUR NEGATIVE 07/06/2017 0813   KETONESUR 80 (A) 07/06/2017 0813   PROTEINUR 30 (A) 07/06/2017 0813   UROBILINOGEN 1.0 12/17/2014 1624   NITRITE NEGATIVE 07/06/2017 0813   LEUKOCYTESUR NEGATIVE 07/06/2017 0813   Recent Results (from the past 240 hour(s))  MRSA PCR Screening     Status: None   Collection Time: 07/03/17 11:08 PM  Result Value Ref Range Status   MRSA by PCR NEGATIVE NEGATIVE Final    Comment:        The GeneXpert MRSA Assay (FDA approved for NASAL specimens only), is one component of a comprehensive MRSA colonization surveillance program. It is not intended to diagnose MRSA infection nor to guide or monitor treatment for MRSA infections. Performed at [facility], [address], [place], [place] [address]   Culture, blood (routine x 2)     Status: None   Collection Time: 07/05/17  1:58 PM  Result Value Ref Range Status   Specimen Description BLOOD RIGHT ANTECUBITAL  Final   Special Requests   Final    BOTTLES DRAWN AEROBIC AND ANAEROBIC Blood Culture adequate volume   Culture   Final    NO GROWTH 5 DAYS Performed at [facility], [address] [place], [place] [address]    Report Status 07/10/2017 FINAL  Final  Culture, blood (routine x 2)     Status: None   Collection Time: 07/05/17  1:59 PM  Result Value Ref Range Status   Specimen Description BLOOD BLOOD RIGHT HAND  Final   Special Requests   Final    BOTTLES DRAWN AEROBIC AND ANAEROBIC Blood Culture results may not be optimal due to an inadequate volume of blood received in culture bottles   Culture   Final    NO GROWTH 5  DAYS Performed at [facility], [address] [place], [place] [address]    Report Status 07/10/2017 FINAL  Final  Culture, Urine     Status: Abnormal   Collection Time: 07/06/17  8:13 AM  Result Value Ref Range Status   Specimen Description URINE, CLEAN CATCH  Final   Special Requests   Final    NONE Performed at [facility], [address], [place], [place] [address]    Culture MULTIPLE SPECIES PRESENT, SUGGEST RECOLLECTION (A)  Final   Report Status 07/07/2017 FINAL  Final      Radiology Studies: No results found.  Scheduled Meds: . amLODipine  10 mg Oral Daily  . atorvastatin  80 mg Oral  q1800  . carvedilol  6.25 mg Oral BID WC  . folic acid  1 mg Oral Daily  . guaiFENesin  1,200 mg Oral BID  . heparin injection (subcutaneous)  5,000 Units Subcutaneous Q8H  . insulin aspart  0-5 Units Subcutaneous QHS  . insulin aspart  0-9 Units Subcutaneous TID WC  . levETIRAcetam  750 mg Oral BID  . lisinopril  20 mg Oral Daily  . multivitamin with minerals  1 tablet Oral Daily  . phosphorus  500 mg Oral BID  . thiamine  100 mg Oral Daily   Continuous Infusions:   LOS: 8 days   Time spent: 25 minutes.  Hazeline Junkeryan Iola Turri, MD [facility] Pager [phone]  If 7PM-7AM, please contact night-coverage [email or web address] Password [email or web address] 07/12/2017, 2:48 PM

## 2017-07-13 LAB — GLUCOSE, CAPILLARY
GLUCOSE-CAPILLARY: 206 mg/dL — AB (ref 65–99)
GLUCOSE-CAPILLARY: 264 mg/dL — AB (ref 65–99)

## 2017-07-13 MED ORDER — INSULIN NPH (HUMAN) (ISOPHANE) 100 UNIT/ML ~~LOC~~ SUSP: 15.0000 [IU] | Freq: Two times a day (BID) | SUBCUTANEOUS | 0 refills | Status: AC

## 2017-07-13 MED ORDER — LEVETIRACETAM 750 MG PO TABS: 750.0000 mg | ORAL_TABLET | Freq: Two times a day (BID) | ORAL | 0 refills | Status: AC

## 2017-07-13 NOTE — Progress Notes (Signed)
Occupational Therapy Treatment Patient Details Name: Richard JakschLeon [name] MRN: [id number] DOB: [birth year] Today's Date: 07/13/2017    History of present illness This 72 y.o. male admitted with Rt sided weakness, difficulty speaking, confusion.   CT and CTA of head were unremarkable.  Pt had seizure like activity while in ED.  Dx:  complex partial seizure and ETOH withdrawal.  PMH:  CAD,  V-Fib Arrest, DM, Lt frontal hemorrhage with Rt hemiparesis.   OT comments  Pt up ambulating in room without assist upon OT entrance.  Cautioned pt not to be up without assist.  He is much better today.  He required min guard assist for functional mobility and min guard to min A for ADLs.  Will continue to follow.   Follow Up Recommendations  Home health OT;Supervision/Assistance - 24 hour    Equipment Recommendations  None recommended by OT    Recommendations for Other Services      Precautions / Restrictions Precautions Precautions: Fall Precaution Comments: mild Rt intattention        Mobility Bed Mobility               General bed mobility comments: ambulating in room   Transfers Overall transfer level: Needs assistance Equipment used: None Transfers: Sit to/from Stand;Stand Pivot Transfers Sit to Stand: Min guard Stand pivot transfers: Min guard       General transfer comment: min guard for balance     Balance Overall balance assessment: Needs assistance Sitting-balance support: Feet supported Sitting balance-Leahy Scale: Good     Standing balance support: Single extremity supported Standing balance-Leahy Scale: Poor Standing balance comment: requires UE support for balance                            ADL either performed or assessed with clinical judgement   ADL Overall ADL's : Needs assistance/impaired Eating/Feeding: Modified independent   Grooming: Wash/dry hands;Wash/dry face;Min guard;Standing                   Toilet Transfer: Min guard;Comfort  height toilet;Ambulation;RW           Functional mobility during ADLs: Min guard General ADL Comments: Pt up ambulating in room upon therapist's entrance.  He indicates he dressed himself today, but unsure if this is accurate.  He indicates that he is eager to discharge home      Vision   Additional Comments: Requires min cues to locate items on his Rt    Perception     Praxis      Cognition Arousal/Alertness: Awake/alert Behavior During Therapy: [facility]FL for tasks assessed/performed;Impulsive Overall Cognitive Status: Difficult to assess                                 General Comments: pt much more interactive, but difficult to fully assess cognition today due to impaired communication.  He would not brush his teeth with OT due to believing the toothbrush that was in his bathroom was his         Exercises     Shoulder Instructions       General Comments      Pertinent Vitals/ Pain       Pain Assessment: No/denies pain Pain Score: 0-No pain  Home Living  Prior Functioning/Environment              Frequency  Min 2X/week        Progress Toward Goals  OT Goals(current goals can now be found in the care plan section)  Progress towards OT goals: Progressing toward goals     Plan Discharge plan needs to be updated    Co-evaluation                 AM-PAC PT "6 Clicks" Daily Activity     Outcome Measure   Help from another person eating meals?: None Help from another person taking care of personal grooming?: A Little Help from another person toileting, which includes using toliet, bedpan, or urinal?: A Little Help from another person bathing (including washing, rinsing, drying)?: A Little Help from another person to put on and taking off regular upper body clothing?: A Little Help from another person to put on and taking off regular lower body clothing?: A Little 6 Click  Score: 19    End of Session    OT Visit Diagnosis: Unsteadiness on feet (R26.81)   Activity Tolerance Patient tolerated treatment well   Patient Left in chair;with call bell/phone within reach;with chair alarm set   Nurse Communication Mobility status        Time: 1039-1050 OT Time Calculation (min): 11 min  Charges: OT General Charges $OT Visit: 1 Visit OT Treatments $Self Care/Home Management : 8-22 mins  [employer], OTR/L [phone]    Jeani Hawking M 07/13/2017, 2:17 PM

## 2017-07-13 NOTE — Care Management Note (Signed)
Case Management Note  Patient Details  Name: Richard [name] MRN: [id number] Date of Birth: 05-02-1945  Subjective/Objective:  This 72 y.o. male admitted with Rt sided weakness, difficulty speaking, confusion.   CT and CTA of head were unremarkable.  Pt had seizure like activity while in ED.  Dx:  complex partial seizure and ETOH withdrawal.  PTA, pt mod I with ambulation; lives with wife.                    Action/Plan: PT/OT recommended CIR, but insurance did not approve CIR admission.  Wife prefers to have pt come home; she states she is able to care for pt at home.  [facility]; referral to [facility], per wife's choice.  Start of care 24-48h post dc date.  [facility]    Expected Discharge Date:  07/13/17               Expected Discharge Plan:  Home w Home Health Services  In-House Referral:  Clinical Social Work  Discharge planning Services  CM Consult  Post Acute Care Choice:  Home Health Choice offered to:  Spouse  DME Arranged:  Dan HumphreysWalker rolling DME Agency:  [facility]  HH Arranged:  RN, PT, OT, Social Work, Nurse's Aide HH Agency:  [facility]  Status of Service:  Completed, signed off  If discussed at [employer] Meetings, dates discussed:    Additional Comments:  Quintella BatonJulie W. Vanity Larsson, RN, BSN  Trauma/Neuro ICU Case Manager [phone]

## 2017-07-13 NOTE — Progress Notes (Addendum)
Patient is very confused. Patient tried to get out of bed on his own.  Bed alarm went off. Patient then wanted to go to the restroom. He had pulled off his condom catheter for the 3rd time tonight. Patient continues to say it is 10 o'clock. Patient received a bath because his bed became saturated with urine. Will continue to monitor.

## 2017-07-13 NOTE — Discharge Summary (Signed)
Physician Discharge Summary  Richard [name] ZOX:[id number] DOB: 10-05-1945 DOA: 07/03/2017  PCP: Elias Else, MD  Admit date: 07/03/2017 Discharge date: 07/13/2017  Admitted From: Home Disposition: Home   Recommendations for Outpatient Follow-up:  1. Follow up with PCP in 1-2 weeks. Decreased insulin as below.  2. Follow up with outpatient neurology. Started on keppra 750mg  po BID for new onset seizure, given seizure precautions/restrictions.   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 CT head and CTA head/neck were unremarkable and the patient had evident seizure-like activity while in the ED. Neurology was consulted, seizure terminated with ativan and keppra load.He is currently being treated for new complex partial seizure and EtOH withdrawal. CIWA was elevated and currently on SDU withdrawal protocol.   He steadily improved and restraints were removed, though he continued having intermittent agitated delirium. CIR was recommended by PT, though this and skilled nursing facility placement were denied by insurance. His wife feels she can take care of him and keep him safe at home, so he is discharged in stable condition.   Discharge Diagnoses:  Principal Problem:   Seizures (HCC) Active Problems:   Essential hypertension   CAD (coronary artery disease)   Alcohol abuse   Acute encephalopathy   Seizure (HCC)  New-onset complex partial seizure: Suspect this is in the setting of left parietal encephalomalacia and possible alcohol withdrawal. EEG done and showed diffuse slowing of the waking background.  Per Neuro Dr. Otelia Limes: No electrographic seizures seen; however, the diffuse slowing may be a postictal phenomenon - Neurology consulted: recommended keppra continued, outpatient neurology follow up.  - Per [facility] statutes, patients with seizures are not allowed to drive until they have been seizure-free for six months. Use caution when using heavy equipment or power tools. Avoid working on ladders or at heights. Take showers instead of baths. Ensure the water temperature is not too high on the home water heater. Do not go swimming alone. When caring for infants or small children, sit down when holding, feeding, or changing them to minimize risk of injury to the child in the event you have a seizure. Also, Maintain good sleep hygiene. Avoid alcohol. - MRI showed partial and motion degraded exam without acute finding. Remote posterior left frontal hemorrhage. 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 Extensive chronic small vessel ischemia and generalized atrophy. - PT/OT evaluated and recommending CIR, though insurance has denied CIR and skilled rehabilitation. Pt will be discharged home with maximized home health services.   Alcohol withdrawal: Resolved. - Continue vitamins. Stable off CIWA.   Agitation: No longer requiring IV ativan or restraints.   CAD s/p PCI March 2017: No chest pain.  - Continue statin, BB, ACE, prn NTG  Hypertension - Improved with restart of home medicationss  History of ICH with dysphagia:  - Resume home BP medications and statin. - Continue dysphagia 3 diet per SLP recommendations  T2DM: HbA1c 6.3%. Strongly suspect noncompliance with home regimen based on minimal need for insulin here.  - Based on inpatient insulin requirements and HbA1c, I wonder if he became hypoglycemic at home precipitating seizures. Will decrease home insulin to 15 units BID, urge PCP follow up.  Leukocytosis: Resolved.  Blood cultures 4/10 negative.    Normocytic anemia: Hgb stable, no bleeding noted.  - Monitor intermittently  Hypokalemia: Resolved.  Hypomagnesemia: Resolved.   Hypophosphatemia: Improved with replacement.   Hyperbilirubinemia: Resolved.   Discharge Instructions Discharge Instructions    Ambulatory referral to Neurology   Complete by:  As directed    An appointment is requested in approximately: 4 weeks for new onset seizure   Diet - low sodium heart healthy   Complete by:  As directed    Diet Carb Modified   Complete by:  As directed    Discharge instructions   Complete by:  As directed    - You were admitted for a seizure and will need to continue taking anti-seizure medication (keppra 750mg  twice daily) - Follow up with neurology as an outpatient. Call the number provided to arrange hospital follow up. - Per [facility] statutes, patients with seizures are not allowed to drive until they have been seizure-free for six months. Use caution when using heavy equipment or power tools. Avoid working on ladders or at heights. Take showers instead of baths. Ensure the water temperature is not too high on the home water heater. Do not go swimming alone. When caring for infants or small children, sit down when holding, feeding, or changing them to minimize risk of injury to the child in the event you have a seizure. Also, Maintain good sleep hygiene. Avoid alcohol. - PT/OT evaluated and recommending rehabilitation, though insurance has denied inpatient rehabilitation and skilled nursing facility rehabilitation. Therefore, you will be discharged home with maximized home health services.  - DECREASE the amount of insulin you are taking. Only take 15 units twice daily with meals as directed below.  - Call your PCP to follow up within the next 2 weeks. - If your symptoms return, seek medical attention right away.   Increase activity slowly   Complete by:  As directed      Allergies as of 07/13/2017   No  Known Allergies     Medication List    TAKE these medications   amLODipine 10 MG tablet Commonly known as:  NORVASC Take 1 tablet (10 mg total) by mouth daily.   atorvastatin 40 MG tablet Commonly known as:  LIPITOR Take 80 mg by mouth daily.   carvedilol 6.25 MG tablet Commonly known as:  COREG Take 1 tablet (6.25 mg total) by mouth 2 (two) times daily with a meal.   insulin NPH Human 100 UNIT/ML injection Commonly known as:  HUMULIN N,NOVOLIN N Inject 0.15 mLs (15 Units total) into the skin 2 (two) times daily before a meal. What changed:    how much to take  when to take this  additional instructions   levETIRAcetam 750 MG tablet Commonly known as:  KEPPRA Take 1 tablet (750 mg total) by mouth 2 (two) times daily.   lisinopril-hydrochlorothiazide 20-12.5 MG tablet Commonly known as:  PRINZIDE,ZESTORETIC Take 1 tablet by mouth daily.   multivitamin with minerals Tabs tablet Take 1 tablet by mouth daily.   nitroGLYCERIN 0.4 MG SL tablet Commonly known as:  NITROSTAT Place 1 tablet (0.4 mg total) under the tongue every 5 (five) minutes as needed for chest pain.      Follow-up Information    Elias Else, MD. Schedule an appointment as soon as possible for a visit in 2 week(s).   Specialty:  Family Medicine Contact information: [phone] [place] [address] [phone]  [facility] Follow up.   Contact information: [address]     [address] [place] [address] [phone]         No Known Allergies  Consultations:  Neurology, Dr. Otelia Limes  Inpatient rehabilitation, Dr. Riley Kill  Procedures:   EEG:  EEG Description: The patient is awakebut mostly drowsyduring the recording. During maximal wakefulness, there is a symmetric, medium voltage 8Hz  posterior dominant rhythm that attenuates with eye opening. This is admixed with a mildamount of diffuse 4-5 Hz theta and 2-3  Hz delta slowing of the waking background. During drowsiness, there is an increase in theta slowing of the background. Stage 2 sleep not seen. There were no epileptiform discharges or electrographic seizures seen.   EKG lead was unremarkable.  Impression: This awake anddrowsyEEG is abnormal due to milddiffuse slowing of the waking background.  Clinical Correlation of the above findings indicates diffuse cerebral dysfunction that is non-specific in etiology and can be seen with hypoxic/ischemic injury, toxic/metabolic encephalopathies, neurodegenerative disorders, or medication effect. The absence of epileptiform discharges does not rule out a clinical diagnosis of epilepsy. Clinical correlation is advised.  Ct Angio Head W Or Wo Contrast  Result Date: 07/03/2017 CLINICAL DATA:  Initial evaluation for acute stroke. EXAM: CT ANGIOGRAPHY HEAD AND NECK TECHNIQUE: Multidetector CT imaging of the head and neck was performed using the standard protocol during bolus administration of intravenous contrast. Multiplanar CT image reconstructions and MIPs were obtained to evaluate the vascular anatomy. Carotid stenosis measurements (when applicable) are obtained utilizing NASCET criteria, using the distal internal carotid diameter as the denominator. Multiphase CT imaging of the brain was performed following IV bolus contrast injection. Subsequent parametric perfusion maps were calculated using RAPID software. CONTRAST:  50mL ISOVUE-370 IOPAMIDOL (ISOVUE-370) INJECTION 76% COMPARISON:  None. FINDINGS: CT HEAD FINDINGS Brain: Generalized age-related cerebral atrophy. Patchy and confluent hypodensity within the periventricular and deep white matter both cerebral hemispheres, most consistent with chronic small vessel ischemic disease, fairly advanced in nature. Encephalomalacia within the posterior left frontal region, consistent with remote infarct/hemorrhage as seen on prior CT. Additional area of  encephalomalacia within the right frontal lobe also consistent with remote infarct, stable from previous. Small remote left occipital infarct noted. Few small remote bilateral basal ganglia lacunar infarcts. No acute intracranial hemorrhage. No evidence for acute large vessel territory infarct. No mass lesion, midline shift or mass effect. No hydrocephalus. No extra-axial fluid collection. Vascular: No asymmetric hyperdense vessel. Calcified atherosclerosis present at the skull base. Skull: Scalp soft tissues within normal limits.  Calvarium intact. Sinuses/Orbits: Left ocular prosthesis noted. Globes and orbital soft tissues demonstrate no acute abnormality. Mild scattered mucosal thickening within the left sphenoid sinus. Paranasal sinuses are otherwise largely clear. No mastoid effusion. Other: None. ASPECTS (Alberta Stroke Program Early CT Score) - Ganglionic level infarction (caudate, lentiform nuclei, internal capsule, insula, M1-M3 cortex): 7 - Supraganglionic infarction (M4-M6 cortex): 3 Total score (0-10 with 10 being normal): 10 Review of the MIP images confirms the above findings Prior to the study being completed, the examination was discontinued by the ordering provider. Scalp AP and lateral tomogram images only are provided. IMPRESSION: 1. No acute intracranial abnormality. 2. ASPECTS = 10. 3. Moderate cerebral atrophy with chronic small vessel ischemic disease with multifocal remote ischemic infarcts as above. 4. The CTA portion of this exam was discontinued by the ordering provider prior to the study being performed. Only topogram images are provided. No charges should be applied to the patient. Electronically Signed  By: Rise Mu M.D.   On: 07/03/2017 20:44   Ct Angio Head W Or Wo Contrast  Result Date: 07/03/2017 CLINICAL DATA:  Initial evaluation for acute stroke. EXAM: CT ANGIOGRAPHY HEAD AND NECK TECHNIQUE: Multidetector CT imaging of the head and neck was performed using the  standard protocol during bolus administration of intravenous contrast. Multiplanar CT image reconstructions and MIPs were obtained to evaluate the vascular anatomy. Carotid stenosis measurements (when applicable) are obtained utilizing NASCET criteria, using the distal internal carotid diameter as the denominator. Multiphase CT imaging of the brain was performed following IV bolus contrast injection. Subsequent parametric perfusion maps were calculated using RAPID software. CONTRAST:  50mL ISOVUE-370 IOPAMIDOL (ISOVUE-370) INJECTION 76% COMPARISON:  None. FINDINGS: CT HEAD FINDINGS Brain: Generalized age-related cerebral atrophy. Patchy and confluent hypodensity within the periventricular and deep white matter both cerebral hemispheres, most consistent with chronic small vessel ischemic disease, fairly advanced in nature. Encephalomalacia within the posterior left frontal region, consistent with remote infarct/hemorrhage as seen on prior CT. Additional area of encephalomalacia within the right frontal lobe also consistent with remote infarct, stable from previous. Small remote left occipital infarct noted. Few small remote bilateral basal ganglia lacunar infarcts. No acute intracranial hemorrhage. No evidence for acute large vessel territory infarct. No mass lesion, midline shift or mass effect. No hydrocephalus. No extra-axial fluid collection. Vascular: No asymmetric hyperdense vessel. Calcified atherosclerosis present at the skull base. Skull: Scalp soft tissues within normal limits.  Calvarium intact. Sinuses/Orbits: Left ocular prosthesis noted. Globes and orbital soft tissues demonstrate no acute abnormality. Mild scattered mucosal thickening within the left sphenoid sinus. Paranasal sinuses are otherwise largely clear. No mastoid effusion. Other: None. ASPECTS (Alberta Stroke Program Early CT Score) - Ganglionic level infarction (caudate, lentiform nuclei, internal capsule, insula, M1-M3 cortex): 7 -  Supraganglionic infarction (M4-M6 cortex): 3 Total score (0-10 with 10 being normal): 10 Review of the MIP images confirms the above findings Prior to the study being completed, the examination was discontinued by the ordering provider. Scalp AP and lateral tomogram images only are provided. IMPRESSION: 1. No acute intracranial abnormality. 2. ASPECTS = 10. 3. Moderate cerebral atrophy with chronic small vessel ischemic disease with multifocal remote ischemic infarcts as above. 4. The CTA portion of this exam was discontinued by the ordering provider prior to the study being performed. Only topogram images are provided. No charges should be applied to the patient. Electronically Signed   By: Rise Mu M.D.   On: 07/03/2017 20:44   Ct Angio Neck W Or Wo Contrast  Result Date: 07/03/2017 CLINICAL DATA:  Initial evaluation for acute stroke. EXAM: CT ANGIOGRAPHY HEAD AND NECK TECHNIQUE: Multidetector CT imaging of the head and neck was performed using the standard protocol during bolus administration of intravenous contrast. Multiplanar CT image reconstructions and MIPs were obtained to evaluate the vascular anatomy. Carotid stenosis measurements (when applicable) are obtained utilizing NASCET criteria, using the distal internal carotid diameter as the denominator. Multiphase CT imaging of the brain was performed following IV bolus contrast injection. Subsequent parametric perfusion maps were calculated using RAPID software. CONTRAST:  50mL ISOVUE-370 IOPAMIDOL (ISOVUE-370) INJECTION 76% COMPARISON:  None. FINDINGS: CT HEAD FINDINGS Brain: Generalized age-related cerebral atrophy. Patchy and confluent hypodensity within the periventricular and deep white matter both cerebral hemispheres, most consistent with chronic small vessel ischemic disease, fairly advanced in nature. Encephalomalacia within the posterior left frontal region, consistent with remote infarct/hemorrhage as seen on prior CT. Additional  area of encephalomalacia within the right  frontal lobe also consistent with remote infarct, stable from previous. Small remote left occipital infarct noted. Few small remote bilateral basal ganglia lacunar infarcts. No acute intracranial hemorrhage. No evidence for acute large vessel territory infarct. No mass lesion, midline shift or mass effect. No hydrocephalus. No extra-axial fluid collection. Vascular: No asymmetric hyperdense vessel. Calcified atherosclerosis present at the skull base. Skull: Scalp soft tissues within normal limits.  Calvarium intact. Sinuses/Orbits: Left ocular prosthesis noted. Globes and orbital soft tissues demonstrate no acute abnormality. Mild scattered mucosal thickening within the left sphenoid sinus. Paranasal sinuses are otherwise largely clear. No mastoid effusion. Other: None. ASPECTS (Alberta Stroke Program Early CT Score) - Ganglionic level infarction (caudate, lentiform nuclei, internal capsule, insula, M1-M3 cortex): 7 - Supraganglionic infarction (M4-M6 cortex): 3 Total score (0-10 with 10 being normal): 10 Review of the MIP images confirms the above findings Prior to the study being completed, the examination was discontinued by the ordering provider. Scalp AP and lateral tomogram images only are provided. IMPRESSION: 1. No acute intracranial abnormality. 2. ASPECTS = 10. 3. Moderate cerebral atrophy with chronic small vessel ischemic disease with multifocal remote ischemic infarcts as above. 4. The CTA portion of this exam was discontinued by the ordering provider prior to the study being performed. Only topogram images are provided. No charges should be applied to the patient. Electronically Signed   By: Rise MuBenjamin  McClintock M.D.   On: 07/03/2017 20:44   Mr Brain Wo Contrast  Result Date: 07/07/2017 CLINICAL DATA:  Right-sided weakness and difficulty speaking. History of left frontal hemorrhage. EXAM: MRI HEAD WITHOUT CONTRAST TECHNIQUE: Multiplanar, multiecho pulse  sequences of the brain and surrounding structures were obtained without intravenous contrast. COMPARISON:  Head CT 07/03/2017.  Brain MRI 06/08/2015 FINDINGS: Brain: No acute infarction, hemorrhage, hydrocephalus, extra-axial collection or mass lesion. Remote left posterior frontal hematoma with hemosiderin lined cavity. Small remote right posterior frontal and left occipital cortically based infarcts. Extensive chronic small vessel ischemia with confluent gliosis in the deep cerebral white matter. Mild to moderate for age cerebral atrophy that is generalized. Vascular: Major flow voids are preserved. Skull and upper cervical spine: No evidence of marrow lesion. 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 Minor ethmoid and sphenoid sinus mucosal thickening with left sphenoid sinus secretions. Other: Scattered smoothly contoured subcutaneous scalp masses that are chronic and consistent with dermal inclusion cysts or similar benign process. Partial and motion degraded exam. IMPRESSION: 1. Partial and motion degraded exam without acute finding. 2. Remote posterior left frontal hemorrhage. 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 3. Extensive chronic small vessel ischemia and generalized atrophy. Electronically Signed   By: Marnee SpringJonathon  Watts M.D.   On: 07/07/2017 12:52   Dg Chest Port 1 View  Result Date: 07/08/2017 CLINICAL DATA:  Shortness of breath EXAM: PORTABLE CHEST 1 VIEW COMPARISON:  July 05, 2017 FINDINGS: The cardiomediastinal silhouette is stable. No pneumothorax. No nodules or masses. No focal infiltrates. IMPRESSION: No active disease. Electronically Signed   By: Gerome Samavid  Williams III M.D   On: 07/08/2017 07:47   Dg Chest Port 1 View  Result Date: 07/05/2017 CLINICAL DATA:  Fever EXAM: PORTABLE CHEST 1 VIEW COMPARISON:  Chest x-ray dated 12/14/2014. FINDINGS: Heart size and mediastinal contours appear stable. Lungs are clear. No pleural effusion or pneumothorax seen. Osseous structures  about the chest are unremarkable. IMPRESSION: No active disease.  No evidence of pneumonia or pulmonary edema. Electronically Signed   By: Bary RichardStan  Maynard M.D.   On: 07/05/2017 14:18  Ct Head Code Stroke Wo Contrast  Result Date: 07/03/2017 CLINICAL DATA:  Initial evaluation for acute stroke. EXAM: CT ANGIOGRAPHY HEAD AND NECK TECHNIQUE: Multidetector CT imaging of the head and neck was performed using the standard protocol during bolus administration of intravenous contrast. Multiplanar CT image reconstructions and MIPs were obtained to evaluate the vascular anatomy. Carotid stenosis measurements (when applicable) are obtained utilizing NASCET criteria, using the distal internal carotid diameter as the denominator. Multiphase CT imaging of the brain was performed following IV bolus contrast injection. Subsequent parametric perfusion maps were calculated using RAPID software. CONTRAST:  50mL ISOVUE-370 IOPAMIDOL (ISOVUE-370) INJECTION 76% COMPARISON:  None. FINDINGS: CT HEAD FINDINGS Brain: Generalized age-related cerebral atrophy. Patchy and confluent hypodensity within the periventricular and deep white matter both cerebral hemispheres, most consistent with chronic small vessel ischemic disease, fairly advanced in nature. Encephalomalacia within the posterior left frontal region, consistent with remote infarct/hemorrhage as seen on prior CT. Additional area of encephalomalacia within the right frontal lobe also consistent with remote infarct, stable from previous. Small remote left occipital infarct noted. Few small remote bilateral basal ganglia lacunar infarcts. No acute intracranial hemorrhage. No evidence for acute large vessel territory infarct. No mass lesion, midline shift or mass effect. No hydrocephalus. No extra-axial fluid collection. Vascular: No asymmetric hyperdense vessel. Calcified atherosclerosis present at the skull base. Skull: Scalp soft tissues within normal limits.  Calvarium intact.  Sinuses/Orbits: Left ocular prosthesis noted. Globes and orbital soft tissues demonstrate no acute abnormality. Mild scattered mucosal thickening within the left sphenoid sinus. Paranasal sinuses are otherwise largely clear. No mastoid effusion. Other: None. ASPECTS (Alberta Stroke Program Early CT Score) - Ganglionic level infarction (caudate, lentiform nuclei, internal capsule, insula, M1-M3 cortex): 7 - Supraganglionic infarction (M4-M6 cortex): 3 Total score (0-10 with 10 being normal): 10 Review of the MIP images confirms the above findings Prior to the study being completed, the examination was discontinued by the ordering provider. Scalp AP and lateral tomogram images only are provided. IMPRESSION: 1. No acute intracranial abnormality. 2. ASPECTS = 10. 3. Moderate cerebral atrophy with chronic small vessel ischemic disease with multifocal remote ischemic infarcts as above. 4. The CTA portion of this exam was discontinued by the ordering provider prior to the study being performed. Only topogram images are provided. No charges should be applied to the patient. Electronically Signed   By: Rise Mu M.D.   On: 07/03/2017 20:44   Subjective: Agitated overnight but redirectable. No new seizures, blood sugar elevated but improved with much less insulin than he takes at home. Eager to leave.   Discharge Exam: Vitals:   07/13/17 0439 07/13/17 0832  BP: 133/79 (!) 143/83  Pulse: 73 72  Resp: 20 14  Temp: 98.8 F (37.1 C) 97.7 F (36.5 C)  SpO2: 99% 98%   General: Pt is alert, awake, not in acute distress Cardiovascular: RRR, S1/S2 +, no rubs, no gallops Respiratory: CTA bilaterally, no wheezing, no rhonchi Abdominal: Soft, NT, ND, bowel sounds + Extremities: No edema, no cyanosis Neuro: Alert with stuttering gait, dysphasia, stable facial droop.   Labs: BNP (last 3 results) No results for input(s): BNP in the last 8760 hours. Basic Metabolic Panel: Recent Labs  Lab 07/07/17 0549  07/08/17 0304 07/09/17 0445 07/10/17 0400 07/11/17 0359  NA 139 137 138 135 138  K 3.9 3.5 3.4* 3.3* 4.2  CL 108 106 102 100* 101  CO2 22 23 26 25 28   GLUCOSE 188* 187* 193* 206* 185*  BUN 14 8 <  5* <5* 6  CREATININE 1.33* 0.94 0.93 0.87 1.01  CALCIUM 8.6* 8.7* 9.0 8.9 9.1  MG 2.0 1.7 1.7 1.8 2.1  PHOS 3.2 2.9 2.8 2.4* 3.9   Liver Function Tests: Recent Labs  Lab 07/07/17 0549 07/08/17 0304 07/09/17 0445 07/10/17 0400 07/11/17 0359  AST 21 24 24  32 48*  ALT 13* 14* 18 23 30   ALKPHOS 54 59 59 60 62  BILITOT 1.1 0.8 0.9 1.0 1.1  PROT 6.0* 6.0* 6.4* 6.3* 6.3*  ALBUMIN 3.1* 3.2* 3.2* 3.5 3.4*   No results for input(s): LIPASE, AMYLASE in the last 168 hours. No results for input(s): AMMONIA in the last 168 hours. CBC: Recent Labs  Lab 07/07/17 0549 07/08/17 0304 07/09/17 0445 07/10/17 0400 07/11/17 0359  WBC 8.6 8.7 7.4 8.0 7.8  NEUTROABS 5.3 5.2 4.2 4.6 4.0  HGB 11.6* 11.4* 11.4* 11.7* 12.1*  HCT 35.5* 34.5* 34.3* 34.4* 36.7*  MCV 93.7 91.0 90.7 89.8 90.4  PLT 192 212 222 235 237   Cardiac Enzymes: No results for input(s): CKTOTAL, CKMB, CKMBINDEX, TROPONINI in the last 168 hours. BNP: Invalid input(s): POCBNP CBG: Recent Labs  Lab 07/12/17 1324 07/12/17 1326 07/12/17 1722 07/12/17 2150 07/13/17 0831  GLUCAP 287* 271* 84 268* 206*   D-Dimer No results for input(s): DDIMER in the last 72 hours. Hgb A1c No results for input(s): HGBA1C in the last 72 hours. Lipid Profile No results for input(s): CHOL, HDL, LDLCALC, TRIG, CHOLHDL, LDLDIRECT in the last 72 hours. Thyroid function studies No results for input(s): TSH, T4TOTAL, T3FREE, THYROIDAB in the last 72 hours.  Invalid input(s): FREET3 Anemia work up No results for input(s): VITAMINB12, FOLATE, FERRITIN, TIBC, IRON, RETICCTPCT in the last 72 hours. Urinalysis    Component Value Date/Time   COLORURINE YELLOW 07/06/2017 0813   APPEARANCEUR CLEAR 07/06/2017 0813   LABSPEC 1.026 07/06/2017 0813    PHURINE 5.0 07/06/2017 0813   GLUCOSEU 150 (A) 07/06/2017 0813   HGBUR NEGATIVE 07/06/2017 0813   BILIRUBINUR NEGATIVE 07/06/2017 0813   KETONESUR 80 (A) 07/06/2017 0813   PROTEINUR 30 (A) 07/06/2017 0813   UROBILINOGEN 1.0 12/17/2014 1624   NITRITE NEGATIVE 07/06/2017 0813   LEUKOCYTESUR NEGATIVE 07/06/2017 0813    Microbiology Recent Results (from the past 240 hour(s))  MRSA PCR Screening     Status: None   Collection Time: 07/03/17 11:08 PM  Result Value Ref Range Status   MRSA by PCR NEGATIVE NEGATIVE Final    Comment:        The GeneXpert MRSA Assay (FDA approved for NASAL specimens only), is one component of a comprehensive MRSA colonization surveillance program. It is not intended to diagnose MRSA infection nor to guide or monitor treatment for MRSA infections. Performed at [facility], [address], [place], [place] [address]   Culture, blood (routine x 2)     Status: None   Collection Time: 07/05/17  1:58 PM  Result Value Ref Range Status   Specimen Description BLOOD RIGHT ANTECUBITAL  Final   Special Requests   Final    BOTTLES DRAWN AEROBIC AND ANAEROBIC Blood Culture adequate volume   Culture   Final    NO GROWTH 5 DAYS Performed at [facility], [address], [place], [place] [address]    Report Status 07/10/2017 FINAL  Final  Culture, blood (routine x 2)     Status: None   Collection Time: 07/05/17  1:59 PM  Result Value Ref Range Status   Specimen  Description BLOOD BLOOD RIGHT HAND  Final   Special Requests   Final    BOTTLES DRAWN AEROBIC AND ANAEROBIC Blood Culture results may not be optimal due to an inadequate volume of blood received in culture bottles   Culture   Final    NO GROWTH 5 DAYS Performed at [facility], [address], [place], [place] [address]    Report Status 07/10/2017 FINAL  Final  Culture, Urine     Status: Abnormal   Collection Time: 07/06/17  8:13 AM  Result Value Ref Range Status   Specimen  Description URINE, CLEAN CATCH  Final   Special Requests   Final    NONE Performed at [facility], [address], [place], [place] [address]    Culture MULTIPLE SPECIES PRESENT, SUGGEST RECOLLECTION (A)  Final   Report Status 07/07/2017 FINAL  Final    Time coordinating discharge: Approximately 40 minutes  Hazeline Junker, MD  [facility] 07/13/2017, 10:27 AM Pager [phone]

## 2017-07-16 DIAGNOSIS — I2510 Atherosclerotic heart disease of native coronary artery without angina pectoris: Secondary | ICD-10-CM | POA: Diagnosis not present

## 2017-07-16 DIAGNOSIS — I69292 Facial weakness following other nontraumatic intracranial hemorrhage: Secondary | ICD-10-CM | POA: Diagnosis not present

## 2017-07-16 DIAGNOSIS — F0390 Unspecified dementia without behavioral disturbance: Secondary | ICD-10-CM | POA: Diagnosis not present

## 2017-07-16 DIAGNOSIS — F10239 Alcohol dependence with withdrawal, unspecified: Secondary | ICD-10-CM | POA: Diagnosis not present

## 2017-07-16 DIAGNOSIS — I252 Old myocardial infarction: Secondary | ICD-10-CM | POA: Diagnosis not present

## 2017-07-16 DIAGNOSIS — E785 Hyperlipidemia, unspecified: Secondary | ICD-10-CM | POA: Diagnosis not present

## 2017-07-16 DIAGNOSIS — I119 Hypertensive heart disease without heart failure: Secondary | ICD-10-CM | POA: Diagnosis not present

## 2017-07-16 DIAGNOSIS — Z9181 History of falling: Secondary | ICD-10-CM | POA: Diagnosis not present

## 2017-07-16 DIAGNOSIS — I69251 Hemiplegia and hemiparesis following other nontraumatic intracranial hemorrhage affecting right dominant side: Secondary | ICD-10-CM | POA: Diagnosis not present

## 2017-07-16 DIAGNOSIS — E1142 Type 2 diabetes mellitus with diabetic polyneuropathy: Secondary | ICD-10-CM | POA: Diagnosis not present

## 2017-07-16 DIAGNOSIS — I69220 Aphasia following other nontraumatic intracranial hemorrhage: Secondary | ICD-10-CM | POA: Diagnosis not present

## 2017-07-16 DIAGNOSIS — Z794 Long term (current) use of insulin: Secondary | ICD-10-CM | POA: Diagnosis not present

## 2017-07-17 DIAGNOSIS — I69251 Hemiplegia and hemiparesis following other nontraumatic intracranial hemorrhage affecting right dominant side: Secondary | ICD-10-CM | POA: Diagnosis not present

## 2017-07-17 DIAGNOSIS — E785 Hyperlipidemia, unspecified: Secondary | ICD-10-CM | POA: Diagnosis not present

## 2017-07-17 DIAGNOSIS — Z794 Long term (current) use of insulin: Secondary | ICD-10-CM | POA: Diagnosis not present

## 2017-07-17 DIAGNOSIS — I119 Hypertensive heart disease without heart failure: Secondary | ICD-10-CM | POA: Diagnosis not present

## 2017-07-17 DIAGNOSIS — F10239 Alcohol dependence with withdrawal, unspecified: Secondary | ICD-10-CM | POA: Diagnosis not present

## 2017-07-17 DIAGNOSIS — E1142 Type 2 diabetes mellitus with diabetic polyneuropathy: Secondary | ICD-10-CM | POA: Diagnosis not present

## 2017-07-17 DIAGNOSIS — I252 Old myocardial infarction: Secondary | ICD-10-CM | POA: Diagnosis not present

## 2017-07-17 DIAGNOSIS — Z9181 History of falling: Secondary | ICD-10-CM | POA: Diagnosis not present

## 2017-07-17 DIAGNOSIS — I69292 Facial weakness following other nontraumatic intracranial hemorrhage: Secondary | ICD-10-CM | POA: Diagnosis not present

## 2017-07-17 DIAGNOSIS — I69220 Aphasia following other nontraumatic intracranial hemorrhage: Secondary | ICD-10-CM | POA: Diagnosis not present

## 2017-07-17 DIAGNOSIS — F0390 Unspecified dementia without behavioral disturbance: Secondary | ICD-10-CM | POA: Diagnosis not present

## 2017-07-17 DIAGNOSIS — I2510 Atherosclerotic heart disease of native coronary artery without angina pectoris: Secondary | ICD-10-CM | POA: Diagnosis not present

## 2017-07-18 NOTE — Therapy (Signed)
[facility] [address] [place], [place], [address] Phone: [phone]   Fax:  [phone]  Patient Details  Name: Richard [name] MRN: [id number] Date of Birth: 05/10/1945 Referring Provider:  No ref. provider found  Encounter Date: 07/18/2017  SPEECH THERAPY DISCHARGE SUMMARY  Visits from Start of Care: 11  Current functional level related to goals / functional outcomes: Pt was satisfied with therapy outcome. He was functional when using his compensatory strategies. He understands he will continue to have minor deficits in speech/langauge going forth. Goals were as follows: SLP SHORT TERM GOAL #1     Title pt will produce sentence responses 90% success using compensations over three sessions    Status Achieved         SLP SHORT TERM GOAL #2    Title pt will engage in 5 minutes simple conversation WFL with modified independence (compensations) over three sessions    Status Achieved         SLP SHORT TERM GOAL #3    Title pt will provide 1-2 sentence responses to SLP verbal stimuli with 90% success using compensations    Status Achieved         SLP SHORT TERM GOAL #4    Title Pt will produce longer responses (7-10 words) in therapy with min cues, and verbalize use of longer responses in social situations.    Status Achieved         SLP SHORT TERM GOAL #5    Title Pt will engage in 15 minute conversation in distracting environment using strategies independently, with self-correction needed <10x    Status Achieved                       SLP Long Term Goals - 04/08/16 1711              SLP LONG TERM GOAL #1    Title pt will participate functionally in 10 minutes min-mod complex conversation using compensations during 3 sessions    Status Achieved         SLP LONG TERM GOAL #2    Title pt will engage in 8 minutes mod complex conversation with occasional min A for compensations    Status Achieved         SLP  LONG TERM GOAL #3    Title Pt will verbalize satisfaction with communicative effectiveness with family and friends in a variety of social situations.    Status Achieved         SLP LONG TERM GOAL #4    Title Pt will demo [facility] verbal expression in 20 minutes mod complex conversation over two sessions     Baseline 03-30-16, 04-05-16    Status Achieved       Remaining deficits: Mild speech/language deficits   Education / Equipment: Compensatory measures for speech/language skills.   Plan: Patient agrees to discharge.  Patient goals were met. Patient is being discharged due to being pleased with the current functional level.  ?????      [place] ,MS, CCC-SLP  07/18/2017, 2:27 PM  [facility] [address] [place], [place], [address] Phone: [phone]   Fax:  [phone]

## 2017-07-24 ENCOUNTER — Emergency Department (HOSPITAL_COMMUNITY): Payer: PPO

## 2017-07-24 ENCOUNTER — Inpatient Hospital Stay (HOSPITAL_COMMUNITY)
Admission: EM | Admit: 2017-07-24 | Discharge: 2017-08-26 | DRG: 100 | Disposition: E | Payer: PPO | Attending: Pulmonary Disease | Admitting: Pulmonary Disease

## 2017-07-24 ENCOUNTER — Other Ambulatory Visit: Payer: Self-pay

## 2017-07-24 DIAGNOSIS — E785 Hyperlipidemia, unspecified: Secondary | ICD-10-CM | POA: Diagnosis not present

## 2017-07-24 DIAGNOSIS — I69393 Ataxia following cerebral infarction: Secondary | ICD-10-CM | POA: Diagnosis not present

## 2017-07-24 DIAGNOSIS — R4701 Aphasia: Secondary | ICD-10-CM | POA: Diagnosis not present

## 2017-07-24 DIAGNOSIS — I10 Essential (primary) hypertension: Secondary | ICD-10-CM | POA: Diagnosis present

## 2017-07-24 DIAGNOSIS — I6789 Other cerebrovascular disease: Secondary | ICD-10-CM | POA: Diagnosis not present

## 2017-07-24 DIAGNOSIS — G9340 Encephalopathy, unspecified: Secondary | ICD-10-CM | POA: Diagnosis present

## 2017-07-24 DIAGNOSIS — I5032 Chronic diastolic (congestive) heart failure: Secondary | ICD-10-CM | POA: Diagnosis not present

## 2017-07-24 DIAGNOSIS — I69351 Hemiplegia and hemiparesis following cerebral infarction affecting right dominant side: Secondary | ICD-10-CM

## 2017-07-24 DIAGNOSIS — G40901 Epilepsy, unspecified, not intractable, with status epilepticus: Secondary | ICD-10-CM | POA: Diagnosis not present

## 2017-07-24 DIAGNOSIS — Z91128 Patient's intentional underdosing of medication regimen for other reason: Secondary | ICD-10-CM | POA: Diagnosis not present

## 2017-07-24 DIAGNOSIS — R29810 Facial weakness: Secondary | ICD-10-CM | POA: Diagnosis not present

## 2017-07-24 DIAGNOSIS — G40109 Localization-related (focal) (partial) symptomatic epilepsy and epileptic syndromes with simple partial seizures, not intractable, without status epilepticus: Secondary | ICD-10-CM | POA: Diagnosis not present

## 2017-07-24 DIAGNOSIS — I2510 Atherosclerotic heart disease of native coronary artery without angina pectoris: Secondary | ICD-10-CM | POA: Diagnosis present

## 2017-07-24 DIAGNOSIS — I468 Cardiac arrest due to other underlying condition: Secondary | ICD-10-CM | POA: Diagnosis not present

## 2017-07-24 DIAGNOSIS — T426X6A Underdosing of other antiepileptic and sedative-hypnotic drugs, initial encounter: Secondary | ICD-10-CM | POA: Diagnosis not present

## 2017-07-24 DIAGNOSIS — K117 Disturbances of salivary secretion: Secondary | ICD-10-CM

## 2017-07-24 DIAGNOSIS — R339 Retention of urine, unspecified: Secondary | ICD-10-CM | POA: Diagnosis not present

## 2017-07-24 DIAGNOSIS — R569 Unspecified convulsions: Secondary | ICD-10-CM | POA: Diagnosis not present

## 2017-07-24 DIAGNOSIS — R064 Hyperventilation: Secondary | ICD-10-CM | POA: Diagnosis not present

## 2017-07-24 DIAGNOSIS — Z66 Do not resuscitate: Secondary | ICD-10-CM | POA: Diagnosis not present

## 2017-07-24 DIAGNOSIS — I252 Old myocardial infarction: Secondary | ICD-10-CM | POA: Diagnosis not present

## 2017-07-24 DIAGNOSIS — Z7902 Long term (current) use of antithrombotics/antiplatelets: Secondary | ICD-10-CM

## 2017-07-24 DIAGNOSIS — G9389 Other specified disorders of brain: Secondary | ICD-10-CM | POA: Diagnosis not present

## 2017-07-24 DIAGNOSIS — R451 Restlessness and agitation: Secondary | ICD-10-CM | POA: Diagnosis not present

## 2017-07-24 DIAGNOSIS — Y92009 Unspecified place in unspecified non-institutional (private) residence as the place of occurrence of the external cause: Secondary | ICD-10-CM | POA: Diagnosis not present

## 2017-07-24 DIAGNOSIS — R069 Unspecified abnormalities of breathing: Secondary | ICD-10-CM

## 2017-07-24 DIAGNOSIS — I619 Nontraumatic intracerebral hemorrhage, unspecified: Secondary | ICD-10-CM | POA: Diagnosis not present

## 2017-07-24 DIAGNOSIS — R0603 Acute respiratory distress: Secondary | ICD-10-CM | POA: Diagnosis not present

## 2017-07-24 DIAGNOSIS — E1165 Type 2 diabetes mellitus with hyperglycemia: Secondary | ICD-10-CM | POA: Diagnosis not present

## 2017-07-24 DIAGNOSIS — I959 Hypotension, unspecified: Secondary | ICD-10-CM | POA: Diagnosis present

## 2017-07-24 DIAGNOSIS — Z9289 Personal history of other medical treatment: Secondary | ICD-10-CM

## 2017-07-24 DIAGNOSIS — G40911 Epilepsy, unspecified, intractable, with status epilepticus: Secondary | ICD-10-CM | POA: Diagnosis not present

## 2017-07-24 DIAGNOSIS — E1142 Type 2 diabetes mellitus with diabetic polyneuropathy: Secondary | ICD-10-CM | POA: Diagnosis not present

## 2017-07-24 DIAGNOSIS — I6200 Nontraumatic subdural hemorrhage, unspecified: Secondary | ICD-10-CM | POA: Diagnosis not present

## 2017-07-24 DIAGNOSIS — R0902 Hypoxemia: Secondary | ICD-10-CM | POA: Diagnosis not present

## 2017-07-24 DIAGNOSIS — R001 Bradycardia, unspecified: Secondary | ICD-10-CM | POA: Diagnosis not present

## 2017-07-24 DIAGNOSIS — I6389 Other cerebral infarction: Secondary | ICD-10-CM | POA: Diagnosis present

## 2017-07-24 DIAGNOSIS — G40201 Localization-related (focal) (partial) symptomatic epilepsy and epileptic syndromes with complex partial seizures, not intractable, with status epilepticus: Principal | ICD-10-CM | POA: Diagnosis present

## 2017-07-24 DIAGNOSIS — J9690 Respiratory failure, unspecified, unspecified whether with hypoxia or hypercapnia: Secondary | ICD-10-CM | POA: Diagnosis not present

## 2017-07-24 DIAGNOSIS — I110 Hypertensive heart disease with heart failure: Secondary | ICD-10-CM | POA: Diagnosis not present

## 2017-07-24 DIAGNOSIS — Z4682 Encounter for fitting and adjustment of non-vascular catheter: Secondary | ICD-10-CM | POA: Diagnosis not present

## 2017-07-24 DIAGNOSIS — G931 Anoxic brain damage, not elsewhere classified: Secondary | ICD-10-CM | POA: Diagnosis present

## 2017-07-24 DIAGNOSIS — Z515 Encounter for palliative care: Secondary | ICD-10-CM | POA: Diagnosis not present

## 2017-07-24 DIAGNOSIS — Z978 Presence of other specified devices: Secondary | ICD-10-CM

## 2017-07-24 DIAGNOSIS — Z7982 Long term (current) use of aspirin: Secondary | ICD-10-CM

## 2017-07-24 DIAGNOSIS — Z823 Family history of stroke: Secondary | ICD-10-CM

## 2017-07-24 DIAGNOSIS — I69398 Other sequelae of cerebral infarction: Secondary | ICD-10-CM

## 2017-07-24 DIAGNOSIS — Z794 Long term (current) use of insulin: Secondary | ICD-10-CM

## 2017-07-24 DIAGNOSIS — J189 Pneumonia, unspecified organism: Secondary | ICD-10-CM | POA: Diagnosis not present

## 2017-07-24 DIAGNOSIS — J9811 Atelectasis: Secondary | ICD-10-CM | POA: Diagnosis not present

## 2017-07-24 DIAGNOSIS — I639 Cerebral infarction, unspecified: Secondary | ICD-10-CM | POA: Diagnosis not present

## 2017-07-24 DIAGNOSIS — J9601 Acute respiratory failure with hypoxia: Secondary | ICD-10-CM | POA: Diagnosis not present

## 2017-07-24 DIAGNOSIS — G40909 Epilepsy, unspecified, not intractable, without status epilepticus: Secondary | ICD-10-CM | POA: Diagnosis not present

## 2017-07-24 DIAGNOSIS — Z955 Presence of coronary angioplasty implant and graft: Secondary | ICD-10-CM

## 2017-07-24 LAB — COMPREHENSIVE METABOLIC PANEL
ALK PHOS: 71 U/L (ref 38–126)
ALT: 26 U/L (ref 17–63)
ANION GAP: 12 (ref 5–15)
AST: 20 U/L (ref 15–41)
Albumin: 3.8 g/dL (ref 3.5–5.0)
BILIRUBIN TOTAL: 1.1 mg/dL (ref 0.3–1.2)
BUN: 11 mg/dL (ref 6–20)
CALCIUM: 9.6 mg/dL (ref 8.9–10.3)
CO2: 28 mmol/L (ref 22–32)
CREATININE: 1.02 mg/dL (ref 0.61–1.24)
Chloride: 97 mmol/L — ABNORMAL LOW (ref 101–111)
GFR calc non Af Amer: >60 mL/min (ref >60)
GLUCOSE: 133 mg/dL — AB (ref 65–99)
Potassium: 3.7 mmol/L (ref 3.5–5.1)
Sodium: 137 mmol/L (ref 135–145)
TOTAL PROTEIN: 7.2 g/dL (ref 6.5–8.1)

## 2017-07-24 LAB — I-STAT CHEM 8, ED
BUN: 13 mg/dL (ref 6–20)
CREATININE: 0.9 mg/dL (ref 0.61–1.24)
Calcium, Ion: 1.15 mmol/L (ref 1.15–1.40)
Chloride: 97 mmol/L — ABNORMAL LOW (ref 101–111)
Glucose, Bld: 126 mg/dL — ABNORMAL HIGH (ref 65–99)
HCT: 43 % (ref 39.0–52.0)
Hemoglobin: 14.6 g/dL (ref 13.0–17.0)
Potassium: 4 mmol/L (ref 3.5–5.1)
SODIUM: 136 mmol/L (ref 135–145)
TCO2: 31 mmol/L (ref 22–32)

## 2017-07-24 LAB — I-STAT ARTERIAL BLOOD GAS, ED
ACID-BASE EXCESS: 3 mmol/L — AB (ref 0.0–2.0)
BICARBONATE: 28 mmol/L (ref 20.0–28.0)
O2 Saturation: 100 %
TCO2: 29 mmol/L (ref 22–32)
pCO2 arterial: 43.1 mmHg (ref 32.0–48.0)
pH, Arterial: 7.419 (ref 7.350–7.450)
pO2, Arterial: 453 mmHg — ABNORMAL HIGH (ref 83.0–108.0)

## 2017-07-24 LAB — CBG MONITORING, ED
Glucose-Capillary: 139 mg/dL — ABNORMAL HIGH (ref 65–99)
Glucose-Capillary: 139 mg/dL — ABNORMAL HIGH (ref 65–99)

## 2017-07-24 LAB — DIFFERENTIAL
Basophils Absolute: 0 10*3/uL (ref 0.0–0.1)
Basophils Relative: 0 %
EOS PCT: 6 %
Eosinophils Absolute: 0.6 10*3/uL (ref 0.0–0.7)
LYMPHS ABS: 2.4 10*3/uL (ref 0.7–4.0)
LYMPHS PCT: 24 %
MONO ABS: 0.9 10*3/uL (ref 0.1–1.0)
MONOS PCT: 9 %
NEUTROS ABS: 6 10*3/uL (ref 1.7–7.7)
Neutrophils Relative %: 61 %

## 2017-07-24 LAB — GLUCOSE, CAPILLARY
Glucose-Capillary: 139 mg/dL — ABNORMAL HIGH (ref 65–99)
Glucose-Capillary: 165 mg/dL — ABNORMAL HIGH (ref 65–99)

## 2017-07-24 LAB — CBC
HEMATOCRIT: 41.3 % (ref 39.0–52.0)
HEMOGLOBIN: 13.9 g/dL (ref 13.0–17.0)
MCH: 30.1 pg (ref 26.0–34.0)
MCHC: 33.7 g/dL (ref 30.0–36.0)
MCV: 89.4 fL (ref 78.0–100.0)
Platelets: 336 10*3/uL (ref 150–400)
RBC: 4.62 MIL/uL (ref 4.22–5.81)
RDW: 11.6 % (ref 11.5–15.5)
WBC: 9.9 10*3/uL (ref 4.0–10.5)

## 2017-07-24 LAB — RAPID URINE DRUG SCREEN, HOSP PERFORMED
AMPHETAMINES: NOT DETECTED
Barbiturates: NOT DETECTED
Benzodiazepines: POSITIVE — AB
Cocaine: NOT DETECTED
OPIATES: NOT DETECTED
TETRAHYDROCANNABINOL: NOT DETECTED

## 2017-07-24 LAB — URINALYSIS, ROUTINE W REFLEX MICROSCOPIC
BILIRUBIN URINE: NEGATIVE
Glucose, UA: 150 mg/dL — AB
Hgb urine dipstick: NEGATIVE
KETONES UR: 5 mg/dL — AB
LEUKOCYTES UA: NEGATIVE
Nitrite: NEGATIVE
PH: 7 (ref 5.0–8.0)
Protein, ur: NEGATIVE mg/dL
Specific Gravity, Urine: 1.015 (ref 1.005–1.030)

## 2017-07-24 LAB — APTT: aPTT: 30 seconds (ref 24–36)

## 2017-07-24 LAB — CORTISOL: Cortisol, Plasma: 8.4 ug/dL

## 2017-07-24 LAB — ETHANOL: Alcohol, Ethyl (B): <10 mg/dL (ref <10)

## 2017-07-24 LAB — I-STAT TROPONIN, ED: Troponin i, poc: 0.01 ng/mL (ref 0.00–0.08)

## 2017-07-24 LAB — PROTIME-INR
INR: 1.01
PROTHROMBIN TIME: 13.2 s (ref 11.4–15.2)

## 2017-07-24 LAB — BRAIN NATRIURETIC PEPTIDE: B Natriuretic Peptide: 42.2 pg/mL (ref 0.0–100.0)

## 2017-07-24 LAB — TRIGLYCERIDES: Triglycerides: 980 mg/dL — ABNORMAL HIGH (ref <150)

## 2017-07-24 MED ORDER — CARVEDILOL 3.125 MG PO TABS: 6.2500 mg | ORAL_TABLET | Freq: Two times a day (BID) | ORAL | Status: DC

## 2017-07-24 MED ORDER — PHENYLEPHRINE HCL 10 MG/ML IJ SOLN
0.0000 ug/min | INTRAMUSCULAR | Status: DC
  Administered 2017-07-24: 15 ug/min via INTRAVENOUS
  Administered 2017-07-24: 20 ug/min via INTRAVENOUS
  Administered 2017-07-25: 32 ug/min via INTRAVENOUS
  Administered 2017-07-25: 20 ug/min via INTRAVENOUS
  Administered 2017-07-25: 40 ug/min via INTRAVENOUS
  Administered 2017-07-26: 42 ug/min via INTRAVENOUS
  Administered 2017-07-26: 40 ug/min via INTRAVENOUS
  Administered 2017-07-26: 38 ug/min via INTRAVENOUS
  Administered 2017-07-26: 40 ug/min via INTRAVENOUS
  Administered 2017-07-27: 28 ug/min via INTRAVENOUS
  Administered 2017-07-27: 26 ug/min via INTRAVENOUS
  Administered 2017-07-27: 30 ug/min via INTRAVENOUS
  Administered 2017-07-28 (×2): 10 ug/min via INTRAVENOUS
  Filled 2017-07-24 (×2): qty 10
  Filled 2017-07-24 (×2): qty 1
  Filled 2017-07-24: qty 10
  Filled 2017-07-24: qty 1
  Filled 2017-07-24 (×2): qty 10
  Filled 2017-07-24 (×2): qty 1
  Filled 2017-07-24 (×5): qty 10

## 2017-07-24 MED ORDER — ACETAMINOPHEN 325 MG PO TABS: 650.0000 mg | ORAL_TABLET | ORAL | Status: DC | PRN

## 2017-07-24 MED ORDER — SODIUM CHLORIDE 0.9 % IV SOLN
200.0000 mg | Freq: Two times a day (BID) | INTRAVENOUS | Status: DC
  Administered 2017-07-24 – 2017-08-01 (×17): 200 mg via INTRAVENOUS
  Filled 2017-07-24 (×24): qty 20

## 2017-07-24 MED ORDER — PANTOPRAZOLE SODIUM 40 MG PO PACK
40.0000 mg | PACK | Freq: Every day | ORAL | Status: DC
  Administered 2017-07-24 – 2017-08-01 (×9): 40 mg
  Filled 2017-07-24 (×10): qty 20

## 2017-07-24 MED ORDER — SODIUM CHLORIDE 0.9 % IV SOLN
1500.0000 mg | INTRAVENOUS | Status: AC
  Administered 2017-07-24: 1500 mg via INTRAVENOUS
  Filled 2017-07-24: qty 30

## 2017-07-24 MED ORDER — PROPOFOL 1000 MG/100ML IV EMUL
INTRAVENOUS | Status: AC
  Administered 2017-07-24: 20 ug/kg/min
  Filled 2017-07-24: qty 100

## 2017-07-24 MED ORDER — ETOMIDATE 2 MG/ML IV SOLN
INTRAVENOUS | Status: AC | PRN
  Administered 2017-07-24: 20 mg via INTRAVENOUS

## 2017-07-24 MED ORDER — FENTANYL CITRATE (PF) 100 MCG/2ML IJ SOLN: 50.0000 ug | INTRAMUSCULAR | Status: DC | PRN

## 2017-07-24 MED ORDER — DOCUSATE SODIUM 50 MG/5ML PO LIQD
100.0000 mg | Freq: Two times a day (BID) | ORAL | Status: DC | PRN
  Administered 2017-07-24 – 2017-08-01 (×3): 100 mg
  Filled 2017-07-24 (×3): qty 10

## 2017-07-24 MED ORDER — SODIUM CHLORIDE 0.9 % IV SOLN
300.0000 mg | INTRAVENOUS | Status: AC
  Administered 2017-07-24: 300 mg via INTRAVENOUS
  Filled 2017-07-24: qty 6

## 2017-07-24 MED ORDER — ORAL CARE MOUTH RINSE
15.0000 mL | Freq: Four times a day (QID) | OROMUCOSAL | Status: DC
  Administered 2017-07-25 – 2017-07-27 (×11): 15 mL via OROMUCOSAL

## 2017-07-24 MED ORDER — THIAMINE HCL 100 MG/ML IJ SOLN
100.0000 mg | Freq: Every day | INTRAMUSCULAR | Status: DC
  Administered 2017-07-25 – 2017-08-01 (×8): 100 mg via INTRAVENOUS
  Filled 2017-07-24 (×8): qty 2

## 2017-07-24 MED ORDER — LORAZEPAM 2 MG/ML IJ SOLN
INTRAMUSCULAR | Status: AC
  Administered 2017-07-24: 1 mg
  Filled 2017-07-24: qty 1

## 2017-07-24 MED ORDER — LORAZEPAM 2 MG/ML IJ SOLN
INTRAMUSCULAR | Status: AC
  Administered 2017-07-24: 11:00:00
  Filled 2017-07-24: qty 1

## 2017-07-24 MED ORDER — CARVEDILOL 12.5 MG PO TABS: 6.2500 mg | ORAL_TABLET | Freq: Two times a day (BID) | ORAL | Status: DC

## 2017-07-24 MED ORDER — ENOXAPARIN SODIUM 40 MG/0.4ML ~~LOC~~ SOLN
40.0000 mg | SUBCUTANEOUS | Status: DC
  Administered 2017-07-24 – 2017-08-01 (×9): 40 mg via SUBCUTANEOUS
  Filled 2017-07-24 (×9): qty 0.4

## 2017-07-24 MED ORDER — SODIUM CHLORIDE 0.9 % IV SOLN
250.0000 mL | INTRAVENOUS | Status: DC | PRN
  Administered 2017-08-01: 250 mL via INTRAVENOUS

## 2017-07-24 MED ORDER — PROPOFOL 1000 MG/100ML IV EMUL
90.0000 ug/kg/min | INTRAVENOUS | Status: DC
  Administered 2017-07-24 – 2017-07-25 (×2): 20 ug/kg/min via INTRAVENOUS
  Administered 2017-07-25 (×7): 80 ug/kg/min via INTRAVENOUS
  Administered 2017-07-26 (×4): 90 ug/kg/min via INTRAVENOUS
  Administered 2017-07-26: 80 ug/kg/min via INTRAVENOUS
  Administered 2017-07-26: 90 ug/kg/min via INTRAVENOUS
  Administered 2017-07-26 (×2): 80 ug/kg/min via INTRAVENOUS
  Administered 2017-07-26: 90 ug/kg/min via INTRAVENOUS
  Administered 2017-07-26: 80 ug/kg/min via INTRAVENOUS
  Administered 2017-07-27 (×5): 90 ug/kg/min via INTRAVENOUS
  Filled 2017-07-24 (×8): qty 100
  Filled 2017-07-24: qty 200
  Filled 2017-07-24 (×3): qty 100
  Filled 2017-07-24: qty 200
  Filled 2017-07-24: qty 100
  Filled 2017-07-24: qty 200
  Filled 2017-07-24: qty 100
  Filled 2017-07-24: qty 200
  Filled 2017-07-24 (×6): qty 100

## 2017-07-24 MED ORDER — CHLORHEXIDINE GLUCONATE 0.12% ORAL RINSE (MEDLINE KIT)
15.0000 mL | Freq: Two times a day (BID) | OROMUCOSAL | Status: DC
  Administered 2017-07-24 – 2017-08-01 (×16): 15 mL via OROMUCOSAL

## 2017-07-24 MED ORDER — LEVETIRACETAM IN NACL 1000 MG/100ML IV SOLN
1000.0000 mg | INTRAVENOUS | Status: AC
Start: 2017-07-24 — End: 2017-07-24
  Administered 2017-07-24: 1000 mg via INTRAVENOUS

## 2017-07-24 MED ORDER — MIDAZOLAM HCL 2 MG/2ML IJ SOLN: 1.0000 mg | INTRAMUSCULAR | Status: DC | PRN

## 2017-07-24 MED ORDER — LORAZEPAM 2 MG/ML IJ SOLN
INTRAMUSCULAR | Status: AC
  Filled 2017-07-24: qty 1

## 2017-07-24 MED ORDER — SODIUM CHLORIDE 0.9 % IV SOLN
Freq: Once | INTRAVENOUS | Status: AC
  Administered 2017-07-24: 13:00:00 via INTRAVENOUS

## 2017-07-24 MED ORDER — PHENYTOIN SODIUM 50 MG/ML IJ SOLN
100.0000 mg | Freq: Three times a day (TID) | INTRAMUSCULAR | Status: DC
  Administered 2017-07-24 – 2017-08-01 (×24): 100 mg via INTRAVENOUS
  Filled 2017-07-24 (×24): qty 2

## 2017-07-24 MED ORDER — SUCCINYLCHOLINE CHLORIDE 20 MG/ML IJ SOLN
INTRAMUSCULAR | Status: AC | PRN
  Administered 2017-07-24: 100 mg via INTRAVENOUS

## 2017-07-24 MED ORDER — INSULIN ASPART 100 UNIT/ML ~~LOC~~ SOLN
1.0000 [IU] | SUBCUTANEOUS | Status: DC
  Administered 2017-07-24: 2 [IU] via SUBCUTANEOUS
  Administered 2017-07-24 – 2017-07-25 (×2): 1 [IU] via SUBCUTANEOUS
  Administered 2017-07-25: 2 [IU] via SUBCUTANEOUS
  Administered 2017-07-25 – 2017-07-26 (×3): 1 [IU] via SUBCUTANEOUS
  Administered 2017-07-26: 2 [IU] via SUBCUTANEOUS
  Administered 2017-07-26: 1 [IU] via SUBCUTANEOUS
  Administered 2017-07-27 (×3): 2 [IU] via SUBCUTANEOUS
  Administered 2017-07-27 (×2): 3 [IU] via SUBCUTANEOUS
  Administered 2017-07-27: 2 [IU] via SUBCUTANEOUS
  Administered 2017-07-28: 3 [IU] via SUBCUTANEOUS

## 2017-07-24 MED ORDER — LORAZEPAM 2 MG/ML IJ SOLN
INTRAMUSCULAR | Status: AC
  Administered 2017-07-24: 2 mg
  Filled 2017-07-24: qty 1

## 2017-07-24 MED ORDER — LEVETIRACETAM IN NACL 1500 MG/100ML IV SOLN
1500.0000 mg | Freq: Two times a day (BID) | INTRAVENOUS | Status: DC
  Administered 2017-07-24 – 2017-08-01 (×16): 1500 mg via INTRAVENOUS
  Filled 2017-07-24 (×18): qty 100

## 2017-07-24 MED ORDER — SODIUM CHLORIDE 0.9 % IV SOLN
INTRAVENOUS | Status: DC
  Administered 2017-07-24: 999 mL via INTRAVENOUS

## 2017-07-24 MED ORDER — IOPAMIDOL (ISOVUE-370) INJECTION 76%: 100.0000 mL | Freq: Once | INTRAVENOUS | Status: DC | PRN

## 2017-07-24 MED ORDER — LEVETIRACETAM IN NACL 1000 MG/100ML IV SOLN
1000.0000 mg | INTRAVENOUS | Status: AC
  Administered 2017-07-24: 1000 mg via INTRAVENOUS

## 2017-07-24 NOTE — ED Notes (Signed)
Per MD Amada Jupiter, give additional 1 mg ativan to patient due to agitation and attempting to climb out of stretcher. This mg was given from the vial pulled and dose given at 9:51 am.

## 2017-07-24 NOTE — Consult Note (Addendum)
PULMONARY / CRITICAL CARE MEDICINE   Name: Richard [name] MRN: [id number] DOB: 1945-12-17    ADMISSION DATE:  08-18-17 CONSULTATION DATE:  4/29  REFERRING MD:  Dr. Freida Busman, ED   CHIEF COMPLAINT:  Status epilepticus   HISTORY OF PRESENT ILLNESS:   Richard [name] is a 72 yo M with history of L frontal stroke, intracranial hemorrhage 2016, CAD, STEMI s/p PCI, VFib arrest, HFpEF (no recent echo), T2DM, and recent diagnosis of complex partial seizure thought to be secondary to L parietal encephalomalacia who presented to the ED with acute encephalopathy thought to be due to acute CVA but found to be seizing. He was recently admitted for seizures and was discharged on 4/18 with instructions to start Keppra. I spoke to wife on the phone who states patient has difficulty swallowing the medication and has not been taking it at home.  This AM, wife noted patient was aphasic and had jerking of RUE and proceeded to call EMS. He was in his usual state of health prior to this. In the ED, he was noted to be actively seizing and was loaded with Keppra and given fosphenytoin as well. He continued to seize and required intubation for airway protection and initiation of propofol gtt for burst-suppression.   PAST MEDICAL HISTORY :  He  has a past medical history of CAD (coronary artery disease), Diabetes mellitus without complication (HCC), Diastolic dysfunction, ETOH abuse, H/O ventricular fibrillation, Hypertensive heart disease, Intracranial hemorrhage (HCC), Left eye injury, ST elevation MI (STEMI) (HCC) (06/12/15), and Stroke (HCC).  PAST SURGICAL HISTORY: He  has a past surgical history that includes Coronary angioplasty and LEFT HEART CATH AND CORONARY ANGIOGRAPHY (N/A, 06/12/2015).  No Known Allergies  No current facility-administered medications on file prior to encounter.    Current Outpatient Medications on File Prior to Encounter  Medication Sig  . amLODipine (NORVASC) 10 MG tablet Take 1 tablet (10 mg  total) by mouth daily.  Marland Kitchen atorvastatin (LIPITOR) 40 MG tablet Take 80 mg by mouth daily.  . carvedilol (COREG) 6.25 MG tablet Take 1 tablet (6.25 mg total) by mouth 2 (two) times daily with a meal.  . insulin NPH Human (HUMULIN N,NOVOLIN N) 100 UNIT/ML injection Inject 0.15 mLs (15 Units total) into the skin 2 (two) times daily before a meal.  . levETIRAcetam (KEPPRA) 750 MG tablet Take 1 tablet (750 mg total) by mouth 2 (two) times daily.  Marland Kitchen lisinopril-hydrochlorothiazide (PRINZIDE,ZESTORETIC) 20-12.5 MG tablet Take 1 tablet by mouth daily.  . Multiple Vitamin (MULTIVITAMIN WITH MINERALS) TABS tablet Take 1 tablet by mouth daily. (Patient not taking: Reported on 07/03/2017)  . nitroGLYCERIN (NITROSTAT) 0.4 MG SL tablet Place 1 tablet (0.4 mg total) under the tongue every 5 (five) minutes as needed for chest pain.    FAMILY HISTORY:  His indicated that his mother is deceased. He indicated that his father is deceased. He indicated that his brother is alive. He indicated that his maternal grandmother is deceased. He indicated that his maternal grandfather is deceased. He indicated that his paternal grandmother is deceased. He indicated that his paternal grandfather is deceased.   SOCIAL HISTORY: He  reports that he has never smoked. He has never used smokeless tobacco. He reports that he does not drink alcohol or use drugs.  REVIEW OF SYSTEMS:  Unable to obtain as patient is intubated and sedated.   SUBJECTIVE: Unable to obtain as patient is intubated and sedated.   VITAL SIGNS: BP (!) 76/55   Pulse 73  Temp 98.4 F (36.9 C) (Oral)   Resp 14   SpO2 99%   HEMODYNAMICS:    VENTILATOR SETTINGS: Vent Mode: PRVC FiO2 (%):  [100 %] 100 % Set Rate:  [14 bmp] 14 bmp Vt Set:  [550 mL] 550 mL PEEP:  [5 cmH20] 5 cmH20 Plateau Pressure:  [26 cmH20] 26 cmH20  INTAKE / OUTPUT: No intake/output data recorded.  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, R eye with reactive pupil, ET and OG tube in place  Cardiovascular: RRR, no mrg appreciated  Lungs:  Mechanical breath sounds, CTAB Abdomen:  Soft, NTND, normoactive bowel sounds  Musculoskeletal:  No deformities or LE edema present  Skin: no rashes or lesions noted   LABS:  BMET Recent Labs  Lab 06-Aug-2017 0930 08/06/17 0941  NA 137 136  K 3.7 4.0  CL 97* 97*  CO2 28  --   BUN 11 13  CREATININE 1.02 0.90  GLUCOSE 133* 126*    Electrolytes Recent Labs  Lab 2017/08/06 0930  CALCIUM 9.6    CBC Recent Labs  Lab 08-06-2017 0930 Aug 06, 2017 0941  WBC 9.9  --   HGB 13.9 14.6  HCT 41.3 43.0  PLT 336  --     Coag's Recent Labs  Lab 08-06-17 0930  APTT 30  INR 1.01    Sepsis Markers No results for input(s): LATICACIDVEN, PROCALCITON, O2SATVEN in the last 168 hours.  ABG No results for input(s): PHART, PCO2ART, PO2ART in the last 168 hours.  Liver Enzymes Recent Labs  Lab Aug 06, 2017 0930  AST 20  ALT 26  ALKPHOS 71  BILITOT 1.1  ALBUMIN 3.8    Cardiac Enzymes No results for input(s): TROPONINI, PROBNP in the last 168 hours.  Glucose Recent Labs  Lab 08-06-2017 1111  GLUCAP 139*    Imaging Dg Chest Port 1 View  Result Date: 08/06/17 CLINICAL DATA:  Hypoxia EXAM: PORTABLE CHEST 1 VIEW COMPARISON:  July 08, 2017 FINDINGS: Endotracheal tube tip is 3.3 cm above the carina. Nasogastric tube tip and side port are below the diaphragm. No pneumothorax. There is no edema or consolidation. Heart size and pulmonary vascularity are normal. No adenopathy. There is aortic atherosclerosis. There is an apparent stent in a left-sided coronary artery. IMPRESSION: Tube positions as described without pneumothorax. No edema or consolidation. There is aortic atherosclerosis. Aortic Atherosclerosis (ICD10-I70.0).  Electronically Signed   By: Bretta Bang III M.D.   On: 2017-08-06 12:06   Ct Head Code Stroke Wo Contrast  Result Date: 06-Aug-2017 CLINICAL DATA:  Code stroke. 72 year old male with aphasia. Seizures. EXAM: CT HEAD WITHOUT CONTRAST TECHNIQUE: Contiguous axial images were obtained from the base of the skull through the vertex without intravenous contrast. COMPARISON:  Brain MRI 07/07/2017. Head CT without contrast 07/03/2017, and earlier. FINDINGS: Brain: Study is mildly degraded by motion artifact. Chronic superior frontal and peri rolandic encephalomalacia re-demonstrated, with confluent bilateral cerebral white matter hypodensity involving the deep white matter capsules. Superimposed hypodensity in the deep gray matter nuclei, mostly the left thalamus, and the brainstem. Stable gray-white matter differentiation throughout the brain. Stable cerebral volume. No midline shift, ventriculomegaly, mass effect, evidence of mass lesion, or evidence of cortically based acute infarction. On some images density along the anterior falx is questionably increased (series 3, image 25), but no other acute intracranial hemorrhage is  identified, and there is no associated mass effect. Vascular: Calcified atherosclerosis at the skull base. No suspicious intracranial vascular hyperdensity. Skull: No acute osseous abnormality identified. Sinuses/Orbits: Stable and well pneumatized. Other: Stable orbit and scalp soft tissues.  Left orbit prosthesis. ASPECTS [facility] Stroke Program Early CT Score) - Ganglionic level infarction (caudate, lentiform nuclei, internal capsule, insula, M1-M3 cortex): 7 - Supraganglionic infarction (M4-M6 cortex): 3 Total score (0-10 with 10 being normal): 10, although there is questionable trace acute intracranial hemorrhage. IMPRESSION: 1. Difficult to exclude trace acute parafalcine subdural hematoma (series 3, image 25), but this could be artifact due to motion. 2. Otherwise stable non contrast  CT appearance of the brain, with multifocal encephalomalacia and advanced chronic small vessel disease. ASPECTS is 10. 3. These results were discussed by telephone with Dr. Amada Jupiter at 9:48 amon 08-08-17. Electronically Signed   By: Odessa Fleming M.D.   On: 2017-08-08 09:50     STUDIES:  1. Head CT - trace acute parafalcine SDH thought to be artifact, known multifocal encephalomalacia and advanced chronic small vessel disease  2. CXR - no active disease   CULTURES: None   ANTIBIOTICS: None   SIGNIFICANT EVENTS: 4/29 - Admitted to ICU due to status epilepticus   LINES/TUBES: 4/29 PIV x3, 4/29 Foley 4/29 ET/OG tubes    DISCUSSION: Jeron Grahn is a 72 yo M with history of L frontal stroke, intracranial hemorrhage 2016, CAD, STEMI s/p PCI, VFib arrest, HFpEF (no recent echo), T2DM, and recent diagnosis of complex partial seizure thought to be secondary to L parietal encephalomalacia who presented to the ED with acute encephalopathy in the setting of status epilepticus 2/2 medication non-compliance. Required intubation and initiation of propofol gtt for burst-suppresion.   ASSESSMENT / PLAN:  PULMONARY A: Intubated for airway protection in the setting of status epilepticus - CXR with no active disease P:    - Continue vent support  - Daily SBTs  - VAP protocol  - CXR in AM   CARDIOVASCULAR A:  CAD and STEMI s/p DES to obtuse marginal  Hx of VFib arrest  HFpEF - no recent echo, TTE 2016 with EF 60%, G1DD, normal wall motion and LV filling pressures  HTN - currently hypotensive while on propofol gtt  P:  - I-stat troponin negative  - Hold home Coreg, amlodipine, and prinzide in the setting of hypotension  - Neo-synephrine ordered, to be given peripherally  - MAP goal > 65  RENAL A:   No acute issues - normal renal function at baseline  P: - BMET in AM  - Monitor UOP   GASTROINTESTINAL A:   Nutrition   SUP  P:   - NPO, will start TF if expect prolonged intubation  -  Protonix 40 mg QD   HEMATOLOGIC A:   No acute issues  P:  - CBC in AM  - SQ Lovenox for VTE ppx   INFECTIOUS A:   No acute issues  ENDOCRINE A:   T2DM - insulin dependent  P:   - SSI-S with CBG monitoring q4h  - CBG goal < 180  NEUROLOGIC A:   Status epilepticus 2/2 medication noncompliance 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 Wife reported patient would like to receive chest compressions, shocks, and medications in the event of an emergency. He is DNI. However, at the time of our conversation patient was already intubated as he arrived unaccompanied and was in status epilepticus and unable to protect airway. Explained I do not expect this to be a prolonged intubation since it is not for a primary respiratory issue and that it was necessary to protect his airway while we treat him for status epilepticus. She voiced understanding and agreed with intubation.   - Inter-disciplinary family meet or Palliative Care meeting due by:  5/5   Burna Cash, MD  Internal Medicine PGY-1  P [phone] 07/09/2017, 1:15 PM

## 2017-07-24 NOTE — ED Notes (Signed)
Dr. Amada Jupiter paged to RN Jill Alexanders regarding blood pressure per his request.

## 2017-07-24 NOTE — Progress Notes (Signed)
vLTM EEG running after finding on routine eeg. No skin breakdown.

## 2017-07-24 NOTE — Consult Note (Addendum)
NEURO HOSPITALIST CONSULT NOTE   Requestig physician: Dr. Hessie Diener   Reason for Consult: Complex partial seizure   History obtained from: EMS HPI:                                                                                                                                          Richard [name] is an 72 y.o. male with history of left frontal stroke, hypertensive, intracranial hemorrhage, alcohol abuse, diabetes.  Patient has been seen earlier this month for right complex partial seizures.  At that time patient was loaded with Keppra and started on 750 mg twice daily of Keppra.  Apparently went to bed last night at 2300 hrs. patient woke up this morning and was noted to have a aphasia along with rhythmic right upper extremity jerking very consistent with seizure.  History is unobtainable from patient as he is a phasic.  At this time patient will be loaded with 1 g of Keppra and 1.5 g of fosphenytoin.  EEG will be ordered stat.  CTA of head and neck have been obtained.  Past Medical History:  Diagnosis Date  . CAD (coronary artery disease)    a. 05/2015 Inflat STEMI/PCI: LM nl, LAD 50p, Om1 100 (3.0 x 16mm Promus Premier DES), OM2/3 small, RCA 40p/d, EF 45-50%.  . Diabetes mellitus without complication (HCC)   . Diastolic dysfunction    a. 11/2014 Echo: EF 60-65%, gr1 DD, mildly dil RA.  . ETOH abuse   . H/O ventricular fibrillation    a. 05/2015 in setting of inferolateral STEMI.  . Hypertensive heart disease   . Intracranial hemorrhage (HCC)    a. 11/2014 in setting of marked HTN->3.5x2.5 left frontal ICH (12-43ml by MRI); b. 11/2014 MRA no evidence of AVM or CAA; c. 11/2014 Carotid U/S: unremarkable.  . 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 ST elevation MI (STEMI) (HCC) 06/12/15  . Stroke [facility])     Past Surgical History:  Procedure Laterality Date  . CORONARY ANGIOPLASTY    . LEFT HEART CATH AND CORONARY ANGIOGRAPHY N/A 06/12/2015    Procedure: Left Heart Cath and Coronary Angiography;  Surgeon: Lyn Records, MD;  Location[facility] INVASIVE CV LAB;  Service: Cardiovascular;  Laterality: N/A;    Family History  Problem Relation Age of Onset  . Stroke Maternal Grandmother   . Healthy Mother   . Other Father        hx unknown           Social History:  reports that he has never smoked. He has never used smokeless tobacco. He reports that he does not drink alcohol or use drugs.  No Known Allergies  MEDICATIONS:  Current Facility-Administered Medications  Medication Dose Route Frequency Provider Last Rate Last Dose  . fosPHENYtoin (CEREBYX) 1,500 mg PE in sodium chloride 0.9 % 50 mL IVPB  1,500 mg PE Intravenous STAT Ulice Dash, PA-C      . iopamidol (ISOVUE-370) 76 % injection 100 mL  100 mL Intravenous Once PRN Rejeana Brock, MD      . levETIRAcetam (KEPPRA) IVPB 1000 mg/100 mL premix  1,000 mg Intravenous STAT Ulice Dash, PA-C 400 mL/hr at 06/28/2017 0950 1,000 mg at 07/06/2017 9562   Current Outpatient Medications  Medication Sig Dispense Refill  . amLODipine (NORVASC) 10 MG tablet Take 1 tablet (10 mg total) by mouth daily. 30 tablet 1  . atorvastatin (LIPITOR) 40 MG tablet Take 80 mg by mouth daily.    . carvedilol (COREG) 6.25 MG tablet Take 1 tablet (6.25 mg total) by mouth 2 (two) times daily with a meal. 60 tablet 6  . insulin NPH Human (HUMULIN N,NOVOLIN N) 100 UNIT/ML injection Inject 0.15 mLs (15 Units total) into the skin 2 (two) times daily before a meal. 10 mL 0  . levETIRAcetam (KEPPRA) 750 MG tablet Take 1 tablet (750 mg total) by mouth 2 (two) times daily. 60 tablet 0  . lisinopril-hydrochlorothiazide (PRINZIDE,ZESTORETIC) 20-12.5 MG tablet Take 1 tablet by mouth daily.    . Multiple Vitamin (MULTIVITAMIN WITH MINERALS) TABS tablet Take 1 tablet by mouth daily. (Patient not  taking: Reported on 07/03/2017)    . nitroGLYCERIN (NITROSTAT) 0.4 MG SL tablet Place 1 tablet (0.4 mg total) under the tongue every 5 (five) minutes as needed for chest pain. 25 tablet 3      ROS:                                                                                                                                       History obtained from unobtainable from patient due to mental status  There were no vitals taken for this visit.   General Examination:                                                                                                       Physical Exam  HEENT-  Normocephalic, no lesions, without obvious abnormality.  Left eye is held closed and superior. Cardiovascular- S1-S2 audible, pulses palpable throughout   Lungs-no rhonchi or wheezing noted, no excessive working breathing.  Saturations within normal limits Abdomen- All 4 quadrants palpated and nontender Extremities- Warm, dry and intact  Musculoskeletal-no joint tenderness, deformity or swelling Skin-warm and dry, no hyperpigmentation, vitiligo, or suspicious lesions  Neurological Examination Mental Status: Patient is alert with expressive but not receptive aphasia.   difficulty following commands Cranial Nerves: II: Discs flat bilaterally; does not blink to threat on the right III,IV, VI: Positive ptosis on the left secondary to eye injury, right eye held in mid position bilaterally pupils equal, round, reactive to light and accommodation V,VII: Face with right facial droop, she is to pain bilateral face VIII: hearing normal bilaterally IX,X: uvula rises symmetrically  Motor: Moving all extremities antigravity with rhythmic jerking on the right shoulder, arm, leg Tone and bulk:normal tone throughout; no atrophy noted Sensory: Withdraws from pain on the left.  Deep Tendon Reflexes: 2+ and symmetric throughout extremities difficult getting upper extremities appears to have 1+ bilaterally in the upper  extremities Plantars: Right upgoing toe   left: downgoing Cerebellar: Unable to get finger- nose Gait: Not tested Lab Results: Basic Metabolic Panel: Recent Labs  Lab 07/13/2017 0941  NA 136  K 4.0  CL 97*  GLUCOSE 126*  BUN 13  CREATININE 0.90    CBC: Recent Labs  Lab 06/30/2017 0941  HGB 14.6  HCT 43.0    Cardiac Enzymes: No results for input(s): CKTOTAL, CKMB, CKMBINDEX, TROPONINI in the last 168 hours.  Lipid Panel: No results for input(s): CHOL, TRIG, HDL, CHOLHDL, VLDL, LDLCALC in the last 168 hours.  Imaging: No results found.  Assessment and plan per attending neurologist  Felicie Morn PA-C [facility] [phone]  07/15/2017, 9:49 AM   Assessment/Plan: 72 year old male with history of left frontal hemorrhagic stroke now presenting with right complex partial status epilepticus. This is very similar to a previous presentation last month.   He has not responded to  IV ativan, keppra and fosphenytoin loads. At this point, I think that given his degree of impairment, I would favor progression to burst suppresion.   1) Burst suppression 2) continue dilantin  TID 3) continue keppra  BID 4) will continue to follow.  5) UA  This patient is critically ill and at significant risk of neurological worsening, death and care requires constant monitoring of vital signs, hemodynamics,respiratory and cardiac monitoring, neurological assessment, discussion with family, other specialists and medical decision making of high complexity. I spent 50 minutes of neurocritical care time  in the care of  this patient.  Ritta Slot, MD [facility] [phone]  If 7pm- 7am, please page neurology on call as listed in AMION. 07/01/2017  12:24 PM

## 2017-07-24 NOTE — Procedures (Signed)
ELECTROENCEPHALOGRAM REPORT  Date of Study: 08-18-17  Patient's Name: Richard [name] MRN: [id number] Date of Birth: Jan 24, 1946  Referring Provider: Dr. Ritta Slot  Clinical History: This is a 72 year old man with aphasia and right arm jerking.  Medications: Keppra Dilantin Development worker, international aid Summary: A multichannel digital EEG recording measured by the international 10-20 system with electrodes applied with paste and impedances below 5000 ohms performed as portable with EKG monitoring in an awake and agitated patient.  Hyperventilation and photic stimulation were not performed.  The digital EEG was referentially recorded, reformatted, and digitally filtered in a variety of bipolar and referential montages for optimal display.   Description: The patient is awake and agitated during the recording.  During maximal wakefulness, there is a poorly sustained low voltage 8 Hz posterior dominant rhythm better formed over the right occipital region. There is occasional diffuse 4-5 Hz theta slowing of the waking background.  Sleep was not captured. Hyperventilation and photic stimulation were not performed. There are lateralized periodic discharges seen over the left hemisphere, maximal over the left parietal region occurring every 1-3 seconds without evolution in frequency or amplitude. With agitation/movement, there is an increase in faster frequencies seen. No jerking reported. There were no electrographic seizures seen.    EKG lead was unremarkable.  Impression: This awake EEG is abnormal due to the presence of: 1. Mild diffuse background slowing  2. Lateralized periodic discharges over the left hemisphere, maximal over the left parietal region  Clinical Correlation of the above findings indicates diffuse cerebral dysfunction that is non-specific in etiology and can be seen with hypoxic/ischemic injury, toxic/metabolic encephalopathies, neurodegenerative disorders, or medication effect.   Although lateralized periodic discharges do not represent ongoing seizure activity, they are commonly associated with an acute brain lesion and clinical focal seizures, or post-ictal after focal status epilepticus. There were no electrographic seizures in this study. Clinical correlation is advised.   Patrcia Dolly, M.D.

## 2017-07-24 NOTE — Consult Note (Signed)
Medical Consultation   Richard [name]  WGN:[id number]  DOB: 06-22-1945  DOA: 07/06/2017  PCP: Elias Else, MD   Outpatient Specialists:    Requesting physician: Dr Freida Busman   Reason for consultation: admission   History of Present Illness: Richard [name] is an 72 y.o. male to left frontal stroke, intracranial hemorrhage, EtOH abuse, diabetes, recent stroke, partial seizures presents emergency department from home with the chief complaint altered mental status and seizure-like activity. Patient went to bed last night in his usual state of health and wife reports he awakened with aphasia along with a rhythmic right upper extremity jerking consistent with seizure. Of note patient was admitted earlier this month for same.chart review indicates at that time he was loaded with Keppra and was discharged on A twice a day. Today EMS was called he was transported to the emergency department. In the ED he was noted to have twitching on the right side of his body and right side of his face. He had visual preference on the left. He had right arm drift after arm drift and affect leg drift extreme aphasia. He was given 2 mg of Ativan. CT obtained revealing stable noncontrast CT appearance of the brain with multifocal encephalomalacia as well as likely artifact.   Review of Systems:  ROS Unable to obtain  Past Medical History: Past Medical History:  Diagnosis Date  . CAD (coronary artery disease)    a. 05/2015 Inflat STEMI/PCI: LM nl, LAD 50p, Om1 100 (3.0 x 16mm Promus Premier DES), OM2/3 small, RCA 40p/d, EF 45-50%.  . Diabetes mellitus without complication (HCC)   . Diastolic dysfunction    a. 11/2014 Echo: EF 60-65%, gr1 DD, mildly dil RA.  . ETOH abuse   . H/O ventricular fibrillation    a. 05/2015 in setting of inferolateral STEMI.  . Hypertensive heart disease   . Intracranial hemorrhage (HCC)    a. 11/2014 in setting of marked HTN->3.5x2.5 left frontal ICH (12-24ml by MRI); b.  11/2014 MRA no evidence of AVM or CAA; c. 11/2014 Carotid U/S: unremarkable.  . 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 ST elevation MI (STEMI) (HCC) 06/12/15  . Stroke [facility])     Past Surgical History: Past Surgical History:  Procedure Laterality Date  . CORONARY ANGIOPLASTY    . LEFT HEART CATH AND CORONARY ANGIOGRAPHY N/A 06/12/2015   Procedure: Left Heart Cath and Coronary Angiography;  Surgeon: Lyn Records, MD;  Location: [facility] INVASIVE CV LAB;  Service: Cardiovascular;  Laterality: N/A;     Allergies:  No Known Allergies   Social History:  reports that he has never smoked. He has never used smokeless tobacco. He reports that he does not drink alcohol or use drugs.   Family History: Family History  Problem Relation Age of Onset  . Stroke Maternal Grandmother   . Healthy Mother   . Other Father        hx unknown       Physical Exam: Vitals:   07/23/2017 0930 06/28/2017 1020 07/21/2017 1100  BP: (!) 157/91 (!) 144/90 129/84  Pulse: 95 93 99  Resp: 18 (!) 27 19  Temp: 98.4 F (36.9 C)    TempSrc: Oral    SpO2: 99% 97% 96%    Constitutional: quite lethargic. Nonverbal. Agitated trying to sit up Eyes: PERLA, EOMI, irises appear normal, anicteric sclera,  ENMT: external ears  and nose appear normal,  Lips appears normal, oropharynx mucosa, tongue, posterior pharynx appear normal  Neck: neck appears normal, no masses, normal ROM, no thyromegaly, no JVD  CVS: S1-S2 clear, no murmur rubs or gallops, no LE edema, normal pedal pulses  Respiratory:  clear to auscultation bilaterally, no wheezing, rales or rhonchi. Respiratory effort normal. No accessory muscle use.  Abdomen: soft nontender, nondistended, normal bowel sounds, no hepatosplenomegaly, no hernias  Musculoskeletal: : no cyanosis, clubbing or edema noted bilaterally                       Neuro: mildly agitated. Moving all extremities with right arm least mobile. Some jerking of left shoulder and left  arm noted Psych: judgement and insight appear normal, stable mood and affect, mental status Skin: no rashes or lesions or ulcers, no induration or nodules    Data reviewed:  I have personally reviewed following labs and imaging studies Labs:  CBC: Recent Labs  Lab 07/25/2017 0930 07/04/2017 0941  WBC 9.9  --   NEUTROABS 6.0  --   HGB 13.9 14.6  HCT 41.3 43.0  MCV 89.4  --   PLT 336  --     Basic Metabolic Panel: Recent Labs  Lab 06/26/2017 0930 07/14/2017 0941  NA 137 136  K 3.7 4.0  CL 97* 97*  CO2 28  --   GLUCOSE 133* 126*  BUN 11 13  CREATININE 1.02 0.90  CALCIUM 9.6  --    GFR Estimated Creatinine Clearance: 83.8 mL/min (by C-G formula based on SCr of 0.9 mg/dL). Liver Function Tests: Recent Labs  Lab 07/08/2017 0930  AST 20  ALT 26  ALKPHOS 71  BILITOT 1.1  PROT 7.2  ALBUMIN 3.8   No results for input(s): LIPASE, AMYLASE in the last 168 hours. No results for input(s): AMMONIA in the last 168 hours. Coagulation profile Recent Labs  Lab 06/29/2017 0930  INR 1.01    Cardiac Enzymes: No results for input(s): CKTOTAL, CKMB, CKMBINDEX, TROPONINI in the last 168 hours. BNP: Invalid input(s): POCBNP CBG: Recent Labs  Lab 07/22/2017 1111  GLUCAP 139*   D-Dimer No results for input(s): DDIMER in the last 72 hours. Hgb A1c No results for input(s): HGBA1C in the last 72 hours. Lipid Profile No results for input(s): CHOL, HDL, LDLCALC, TRIG, CHOLHDL, LDLDIRECT in the last 72 hours. Thyroid function studies No results for input(s): TSH, T4TOTAL, T3FREE, THYROIDAB in the last 72 hours.  Invalid input(s): FREET3 Anemia work up No results for input(s): VITAMINB12, FOLATE, FERRITIN, TIBC, IRON, RETICCTPCT in the last 72 hours. Urinalysis    Component Value Date/Time   COLORURINE YELLOW 07/06/2017 0813   APPEARANCEUR CLEAR 07/06/2017 0813   LABSPEC 1.026 07/06/2017 0813   PHURINE 5.0 07/06/2017 0813   GLUCOSEU 150 (A) 07/06/2017 0813   HGBUR NEGATIVE  07/06/2017 0813   BILIRUBINUR NEGATIVE 07/06/2017 0813   KETONESUR 80 (A) 07/06/2017 0813   PROTEINUR 30 (A) 07/06/2017 0813   UROBILINOGEN 1.0 12/17/2014 1624   NITRITE NEGATIVE 07/06/2017 0813   LEUKOCYTESUR NEGATIVE 07/06/2017 0813     Microbiology No results found for this or any previous visit (from the past 240 hour(s)).     Inpatient Medications:   Scheduled Meds: . LORazepam       Continuous Infusions: . fosPHENYtoin (CEREBYX) IV    . levETIRAcetam    . levETIRAcetam       Radiological Exams on Admission: Ct Head Code Stroke Wo Contrast  Result Date: 07/03/2017 CLINICAL DATA:  Code stroke. 72 year old male with aphasia. Seizures. EXAM: CT HEAD WITHOUT CONTRAST TECHNIQUE: Contiguous axial images were obtained from the base of the skull through the vertex without intravenous contrast. COMPARISON:  Brain MRI 07/07/2017. Head CT without contrast 07/03/2017, and earlier. FINDINGS: Brain: Study is mildly degraded by motion artifact. Chronic superior frontal and peri rolandic encephalomalacia re-demonstrated, with confluent bilateral cerebral white matter hypodensity involving the deep white matter capsules. Superimposed hypodensity in the deep gray matter nuclei, mostly the left thalamus, and the brainstem. Stable gray-white matter differentiation throughout the brain. Stable cerebral volume. No midline shift, ventriculomegaly, mass effect, evidence of mass lesion, or evidence of cortically based acute infarction. On some images density along the anterior falx is questionably increased (series 3, image 25), but no other acute intracranial hemorrhage is identified, and there is no associated mass effect. Vascular: Calcified atherosclerosis at the skull base. No suspicious intracranial vascular hyperdensity. Skull: No acute osseous abnormality identified. Sinuses/Orbits: Stable and well pneumatized. Other: Stable orbit and scalp soft tissues.  Left orbit prosthesis. ASPECTS [facility]  Stroke Program Early CT Score) - Ganglionic level infarction (caudate, lentiform nuclei, internal capsule, insula, M1-M3 cortex): 7 - Supraganglionic infarction (M4-M6 cortex): 3 Total score (0-10 with 10 being normal): 10, although there is questionable trace acute intracranial hemorrhage. IMPRESSION: 1. Difficult to exclude trace acute parafalcine subdural hematoma (series 3, image 25), but this could be artifact due to motion. 2. Otherwise stable non contrast CT appearance of the brain, with multifocal encephalomalacia and advanced chronic small vessel disease. ASPECTS is 10. 3. These results were discussed by telephone with Dr. Amada Jupiter at 9:48 amon 07/22/2017. Electronically Signed   By: Odessa Fleming M.D.   On: 06/28/2017 09:50    Impression/Recommendations Principal Problem:   Seizure (HCC) Active Problems:   Type 2 diabetes mellitus with peripheral neuropathy (HCC)   Essential hypertension   Ataxia, post-stroke   Hyperlipidemia   CAD (coronary artery disease)   Acute encephalopathy  #1. Seizure. EEG done in the emergency department reveals status epilepticus n spite of medications provided in the emergency department. At that time intensivist notified patient will be admitted to the SCU.  #2. Acute encephalopathy. CT of the head as noted above. -See #1  #3. Diabetes.  Serum glucose 126   Thank you for this consultation.  Our [facility] hospitalist team will follow the patient with you.   Time Spent: 45 minutes  Gwenyth Bender M.D. [facility] 07/19/2017, 11:13 AM

## 2017-07-24 NOTE — ED Notes (Signed)
Attempted report 

## 2017-07-24 NOTE — Code Documentation (Addendum)
73 yo male coming from home where he lives with his wife with complaints of right sided weakness, twitching, not following commands, and not making sense. Upon arrival, Pt had twitching noted to the right side of the body and right face. Visual preference of the left side. Initial NIHSS 12 due to left visual preference, right arm drift, right left drift, left leg drift, extreme aphasia, unable to follow commands. Pt given 2 mg of Ativan before CT to help with twitching. CT completed. Seizure suspected due to patient having similar presentation recently. Pt given 2 mg of Ativan after CT to help with twitching. Pt transferred to Trauma B. Loaded with Keppra. Handoff given to [place], [place]

## 2017-07-24 NOTE — ED Provider Notes (Addendum)
[facility] EMERGENCY DEPARTMENT Provider Note   CSN: [id number] Arrival date & time: 07/19/2017  7829   An emergency department physician performed an initial assessment on this suspected stroke patient at [phone].  History   Chief Complaint Chief Complaint  Patient presents with  . Code Stroke    HPI Richard [name] is a 72 y.o. male.  72 year old male presents complaining of upper extremity jerking as well as expressive aphasia when he awoke this morning.  Last seen normal was 11:00 last night.  Patient does have a prior history of seizure disorder in review of his old record shows that he was admitted to the hospital this month for similar symptoms and had complete work-up at that time.  Was called a code stroke in the field prior to arrival.  Was noted to not have generalized seizure activity.  Blood sugar was above 80.  Transported here for further management and no further history obtainable due to his current state     Past Medical History:  Diagnosis Date  . CAD (coronary artery disease)    a. 05/2015 Inflat STEMI/PCI: LM nl, LAD 50p, Om1 100 (3.0 x 16mm Promus Premier DES), OM2/3 small, RCA 40p/d, EF 45-50%.  . Diabetes mellitus without complication (HCC)   . Diastolic dysfunction    a. 11/2014 Echo: EF 60-65%, gr1 DD, mildly dil RA.  . ETOH abuse   . H/O ventricular fibrillation    a. 05/2015 in setting of inferolateral STEMI.  . Hypertensive heart disease   . Intracranial hemorrhage (HCC)    a. 11/2014 in setting of marked HTN->3.5x2.5 left frontal ICH (12-34ml by MRI); b. 11/2014 MRA no evidence of AVM or CAA; c. 11/2014 Carotid U/S: unremarkable.  . 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 ST elevation MI (STEMI) (HCC) 06/12/15  . Stroke [facility])     Patient Active Problem List   Diagnosis Date Noted  . Alcohol abuse 07/04/2017  . Acute encephalopathy 07/04/2017  . Seizure (HCC) 07/04/2017  . Seizures (HCC) 07/03/2017  . CAD (coronary  artery disease) 06/22/2015  . Hypertensive heart disease 06/14/2015  . Hyperlipidemia 06/14/2015  . 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 Aphasia complicating stroke 02/09/2015  . 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 Type 2 diabetes mellitus with peripheral neuropathy (HCC)   . Essential hypertension   . Alcohol withdrawal (HCC)   . Cerebral hemorrhage (HCC)   . Endotracheally intubated     Past Surgical History:  Procedure Laterality Date  . CORONARY ANGIOPLASTY    . LEFT HEART CATH AND CORONARY ANGIOGRAPHY N/A 06/12/2015   Procedure: Left Heart Cath and Coronary Angiography;  Surgeon: Lyn Records, MD;  Location: [facility] INVASIVE CV LAB;  Service: Cardiovascular;  Laterality: N/A;        Home Medications    Prior to Admission medications   Medication Sig Start Date End Date Taking? Authorizing Provider  amLODipine (NORVASC) 10 MG tablet Take 1 tablet (10 mg total) by mouth daily. 12/26/14   Angiulli, Mcarthur Rossetti, PA-C  atorvastatin (LIPITOR) 40 MG tablet Take 80 mg by mouth daily.    [provider]  carvedilol (COREG) 6.25 MG tablet Take 1 tablet (6.25 mg total) by mouth 2 (two) times daily with a meal. 06/14/15   Creig Hines, NP  insulin NPH Human (HUMULIN N,NOVOLIN N) 100 UNIT/ML injection Inject 0.15 mLs (15 Units total) into  the skin 2 (two) times daily before a meal. 07/13/17   Tyrone Nine, MD  levETIRAcetam (KEPPRA) 750 MG tablet Take 1 tablet (750 mg total) by mouth 2 (two) times daily. 07/13/17   Tyrone Nine, MD  lisinopril-hydrochlorothiazide (PRINZIDE,ZESTORETIC) 20-12.5 MG tablet Take 1 tablet by mouth daily.    [provider]  Multiple Vitamin (MULTIVITAMIN WITH MINERALS) TABS tablet Take 1 tablet by mouth daily. Patient not taking: Reported on 07/03/2017 12/26/14   Angiulli, Mcarthur Rossetti, PA-C  nitroGLYCERIN (NITROSTAT) 0.4 MG SL tablet Place 1 tablet (0.4 mg total)  under the tongue every 5 (five) minutes as needed for chest pain. 06/22/15   Dyann Kief, PA-C    Family History Family History  Problem Relation Age of Onset  . Stroke Maternal Grandmother   . Healthy Mother   . Other Father        hx unknown    Social History Social History   Tobacco Use  . Smoking status: Never Smoker  . Smokeless tobacco: Never Used  Substance Use Topics  . Alcohol use: No    Alcohol/week: 0.0 oz    Comment: prev heavy  . Drug use: No     Allergies   Patient has no known allergies.   Review of Systems Review of Systems  Unable to perform ROS: Acuity of condition     Physical Exam Updated Vital Signs BP (!) 157/91 (BP Location: Left Arm)   Pulse 95   Temp 98.4 F (36.9 C) (Oral)   Resp 18   SpO2 99%   Physical Exam  Constitutional: He appears well-developed and well-nourished.  Non-toxic appearance. No distress.  HENT:  Head: Normocephalic and atraumatic.  Eyes: Pupils are equal, round, and reactive to light. Conjunctivae, EOM and lids are normal.  Neck: Normal range of motion. Neck supple. No tracheal deviation present. No thyroid mass present.  Cardiovascular: Normal rate, regular rhythm and normal heart sounds. Exam reveals no gallop.  No murmur heard. Pulmonary/Chest: Effort normal and breath sounds normal. No stridor. No respiratory distress. He has no decreased breath sounds. He has no wheezes. He has no rhonchi. He has no rales.  Abdominal: Soft. Normal appearance and bowel sounds are normal. He exhibits no distension. There is no tenderness. There is no rebound and no CVA tenderness.  Musculoskeletal: Normal range of motion. He exhibits no edema or tenderness.  Neurological: He is alert. No cranial nerve deficit or sensory deficit. GCS eye subscore is 4. GCS verbal subscore is 4. GCS motor subscore is 5.  Patient noted to have spastic movements of both upper extremities.  He has 4 5 strength in left upper extremity.  The right  extremity does have some tremors.  Does appear to have expressive aphasia without facial droop or eye deviation.  Is able to wiggle his toes bilaterally.  Unable to assess cerebellar functions  Skin: Skin is warm and dry. No abrasion and no rash noted.  Psychiatric: His affect is blunt.  Nursing note and vitals reviewed.    ED Treatments / Results  Labs (all labs ordered are listed, but only abnormal results are displayed) Labs Reviewed  I-STAT CHEM 8, ED - Abnormal; Notable for the following components:      Result Value   Chloride 97 (*)    Glucose, Bld 126 (*)    All other components within normal limits  PROTIME-INR  APTT  CBC  DIFFERENTIAL  ETHANOL  COMPREHENSIVE METABOLIC PANEL  RAPID URINE DRUG SCREEN,  HOSP PERFORMED  URINALYSIS, ROUTINE W REFLEX MICROSCOPIC  I-STAT TROPONIN, ED    EKG None  Radiology Ct Head Code Stroke Wo Contrast  Result Date: 07/02/2017 CLINICAL DATA:  Code stroke. 72 year old male with aphasia. Seizures. EXAM: CT HEAD WITHOUT CONTRAST TECHNIQUE: Contiguous axial images were obtained from the base of the skull through the vertex without intravenous contrast. COMPARISON:  Brain MRI 07/07/2017. Head CT without contrast 07/03/2017, and earlier. FINDINGS: Brain: Study is mildly degraded by motion artifact. Chronic superior frontal and peri rolandic encephalomalacia re-demonstrated, with confluent bilateral cerebral white matter hypodensity involving the deep white matter capsules. Superimposed hypodensity in the deep gray matter nuclei, mostly the left thalamus, and the brainstem. Stable gray-white matter differentiation throughout the brain. Stable cerebral volume. No midline shift, ventriculomegaly, mass effect, evidence of mass lesion, or evidence of cortically based acute infarction. On some images density along the anterior falx is questionably increased (series 3, image 25), but no other acute intracranial hemorrhage is identified, and there is no  associated mass effect. Vascular: Calcified atherosclerosis at the skull base. No suspicious intracranial vascular hyperdensity. Skull: No acute osseous abnormality identified. Sinuses/Orbits: Stable and well pneumatized. Other: Stable orbit and scalp soft tissues.  Left orbit prosthesis. ASPECTS [facility] Stroke Program Early CT Score) - Ganglionic level infarction (caudate, lentiform nuclei, internal capsule, insula, M1-M3 cortex): 7 - Supraganglionic infarction (M4-M6 cortex): 3 Total score (0-10 with 10 being normal): 10, although there is questionable trace acute intracranial hemorrhage. IMPRESSION: 1. Difficult to exclude trace acute parafalcine subdural hematoma (series 3, image 25), but this could be artifact due to motion. 2. Otherwise stable non contrast CT appearance of the brain, with multifocal encephalomalacia and advanced chronic small vessel disease. ASPECTS is 10. 3. These results were discussed by telephone with Dr. Amada Jupiter at 9:48 amon 07/16/2017. Electronically Signed   By: Odessa Fleming M.D.   On: 07/08/2017 09:50     11:30 AM Called to bedside by admitting physician due to patient continued to have seizure activity based on EEG evaluation.  Patient will require a propofol drip and therefore required airway management.  Please see the note below.  Spoke with Dr. Kendrick Fries from the intensivist service and he will come to admit the patient to the ICU Procedures Procedure Name: Intubation Date/Time: 07/17/2017 11:28 AM Performed by: Lorre Nick, MD Pre-anesthesia Checklist: Patient identified Oxygen Delivery Method: Ambu bag Preoxygenation: Pre-oxygenation with 100% oxygen Induction Type: Rapid sequence Ventilation: Mask ventilation without difficulty Laryngoscope Size: Glidescope and 1 Grade View: Grade II Tube size: 7.5 mm Number of attempts: 1 Airway Equipment and Method: Video-laryngoscopy Placement Confirmation: ETT inserted through vocal cords under direct vision,  CO2  detector and Breath sounds checked- equal and bilateral Secured at: 24 cm Tube secured with: ETT holder Dental Injury: Teeth and Oropharynx as per pre-operative assessment       (including critical care time)  Medications Ordered in ED Medications  iopamidol (ISOVUE-370) 76 % injection 100 mL (has no administration in time range)  fosPHENYtoin (CEREBYX) 1,500 mg PE in sodium chloride 0.9 % 50 mL IVPB (has no administration in time range)  LORazepam (ATIVAN) 2 MG/ML injection (2 mg  Given 07/04/2017 0927)  levETIRAcetam (KEPPRA) IVPB 1000 mg/100 mL premix (1,000 mg Intravenous New Bag/Given 07/23/2017 0950)  LORazepam (ATIVAN) 2 MG/ML injection (1 mg  Given 07/09/2017 0951)     Initial Impression / Assessment and Plan / ED Course  I have reviewed the triage vital signs and the nursing notes.  Pertinent  labs & imaging results that were available during my care of the patient were reviewed by me and considered in my medical decision making (see chart for details).    Patient was code stroke on arrival was seen by Dr. Amada Jupiter from neurology.  Had a head CT which showed possible trace parafalcine subdural hematoma but this is felt to be likely artifact.  Neurology feels that patient's current symptoms are likely epileptic in etiology.  Patient will be loaded with Keppra and fosphenytoin per their order.  EEG has been ordered and they are recommending hospitalist admission  CRITICAL CARE Performed by: Toy Baker Total critical care time: 50 minutes Critical care time was exclusive of separately billable procedures and treating other patients. Critical care was necessary to treat or prevent imminent or life-threatening deterioration. Critical care was time spent personally by me on the following activities: development of treatment plan with patient and/or surrogate as well as nursing, discussions with consultants, evaluation of patient's response to treatment, examination of patient,  obtaining history from patient or surrogate, ordering and performing treatments and interventions, ordering and review of laboratory studies, ordering and review of radiographic studies, pulse oximetry and re-evaluation of patient's condition.   Final Clinical Impressions(s) / ED Diagnoses   Final diagnoses:  None    ED Discharge Orders    None       Lorre Nick, MD 07/22/2017 1013    Lorre Nick, MD 07/10/2017 1014    Lorre Nick, MD 07/13/2017 1130

## 2017-07-24 NOTE — ED Notes (Signed)
Pt's CBG result was 139. Informed Justin - RN.

## 2017-07-24 NOTE — ED Notes (Signed)
Foley insertion area inspected. A tinge of blood was present. Informed Justin - RN.

## 2017-07-24 NOTE — Progress Notes (Signed)
EEG completed, results pending. 

## 2017-07-25 ENCOUNTER — Inpatient Hospital Stay (HOSPITAL_COMMUNITY): Payer: PPO

## 2017-07-25 LAB — MRSA PCR SCREENING: MRSA BY PCR: NEGATIVE

## 2017-07-25 LAB — CBC
HCT: 37.5 % — ABNORMAL LOW (ref 39.0–52.0)
HEMOGLOBIN: 12.3 g/dL — AB (ref 13.0–17.0)
MCH: 29.4 pg (ref 26.0–34.0)
MCHC: 32.8 g/dL (ref 30.0–36.0)
MCV: 89.5 fL (ref 78.0–100.0)
PLATELETS: 303 10*3/uL (ref 150–400)
RBC: 4.19 MIL/uL — AB (ref 4.22–5.81)
RDW: 11.6 % (ref 11.5–15.5)
WBC: 10.4 10*3/uL (ref 4.0–10.5)

## 2017-07-25 LAB — POCT I-STAT 3, ART BLOOD GAS (G3+)
ACID-BASE DEFICIT: 1 mmol/L (ref 0.0–2.0)
Bicarbonate: 23 mmol/L (ref 20.0–28.0)
O2 SAT: 100 %
TCO2: 24 mmol/L (ref 22–32)
pCO2 arterial: 34.3 mmHg (ref 32.0–48.0)
pH, Arterial: 7.435 (ref 7.350–7.450)
pO2, Arterial: 182 mmHg — ABNORMAL HIGH (ref 83.0–108.0)

## 2017-07-25 LAB — COMPREHENSIVE METABOLIC PANEL
ALT: 20 U/L (ref 17–63)
AST: 19 U/L (ref 15–41)
Albumin: 3.2 g/dL — ABNORMAL LOW (ref 3.5–5.0)
Alkaline Phosphatase: 58 U/L (ref 38–126)
Anion gap: 8 (ref 5–15)
BUN: 14 mg/dL (ref 6–20)
CHLORIDE: 105 mmol/L (ref 101–111)
CO2: 25 mmol/L (ref 22–32)
CREATININE: 1.27 mg/dL — AB (ref 0.61–1.24)
Calcium: 8.8 mg/dL — ABNORMAL LOW (ref 8.9–10.3)
GFR, EST NON AFRICAN AMERICAN: 55 mL/min — AB (ref >60)
Glucose, Bld: 125 mg/dL — ABNORMAL HIGH (ref 65–99)
Potassium: 3.4 mmol/L — ABNORMAL LOW (ref 3.5–5.1)
SODIUM: 138 mmol/L (ref 135–145)
Total Bilirubin: 1 mg/dL (ref 0.3–1.2)
Total Protein: 6.1 g/dL — ABNORMAL LOW (ref 6.5–8.1)

## 2017-07-25 LAB — BLOOD GAS, ARTERIAL
Acid-Base Excess: 2.1 mmol/L — ABNORMAL HIGH (ref 0.0–2.0)
Bicarbonate: 24.8 mmol/L (ref 20.0–28.0)
DRAWN BY: 252031
FIO2: 40
O2 SAT: 99.3 %
PATIENT TEMPERATURE: 98.6
PCO2 ART: 30.2 mmHg — AB (ref 32.0–48.0)
PEEP: 5 cmH2O
PH ART: 7.524 — AB (ref 7.350–7.450)
RATE: 14 resp/min
VT: 550 mL
pO2, Arterial: 161 mmHg — ABNORMAL HIGH (ref 83.0–108.0)

## 2017-07-25 LAB — GLUCOSE, CAPILLARY
GLUCOSE-CAPILLARY: 150 mg/dL — AB (ref 65–99)
Glucose-Capillary: 115 mg/dL — ABNORMAL HIGH (ref 65–99)
Glucose-Capillary: 115 mg/dL — ABNORMAL HIGH (ref 65–99)
Glucose-Capillary: 127 mg/dL — ABNORMAL HIGH (ref 65–99)
Glucose-Capillary: 175 mg/dL — ABNORMAL HIGH (ref 65–99)
Glucose-Capillary: 94 mg/dL (ref 65–99)

## 2017-07-25 LAB — TRIGLYCERIDES: Triglycerides: 117 mg/dL (ref <150)

## 2017-07-25 LAB — MAGNESIUM: MAGNESIUM: 1.8 mg/dL (ref 1.7–2.4)

## 2017-07-25 LAB — PHOSPHORUS: PHOSPHORUS: 2.6 mg/dL (ref 2.5–4.6)

## 2017-07-25 MED ORDER — POTASSIUM CHLORIDE 20 MEQ/15ML (10%) PO SOLN
20.0000 meq | ORAL | Status: AC
  Administered 2017-07-25 (×2): 20 meq
  Filled 2017-07-25 (×2): qty 15

## 2017-07-25 NOTE — Procedures (Addendum)
Video EEG Monitoring Report   Recording was reviewed until 4/30 :30 once the missing portion was recovered..  There are no new findings or changes to the classification and interpretation.   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    EEG Classification  Abnormal, Awake --> Coma    PDR  7 Hz, somewhat slower on the left - seen only in the beginning of the recording  HR  60-80 bpm and regular    Background abnormalities:   1. Continuous slow --> Burst suppression, generalized     Periodic, rhythmic or epileptiform abnormalities:   1. Spiky LPD <-> LPD+F, left parietal   Ictal phenomena:  Seizure / episode type #1:  Clinical: NCS   EEG: seizure pattern, left parietal  Count: 1 / 4-6 minutes    EEG DETAILS:  TYPE OF RECORDING: At least 18-channel digital EEG with using a standard international 10-20 placement with additional EEG electrodes, and 1 additional channel dedicated to the EKG, at a sampling rate of at least 256 Hz. Video was recorded throughout the study. The recording was interpreted using digital review software allowing for montage reformatting, gain and filter changes. Each page was reviewed manually. Automatic spike and seizure detection software and quantitative analysis tools were used as needed.   Description of EEG features: The recording opens in the awake state.   During wakefulness normal voltage, mixed frequency rhythms are seen with a dominant theta>delta background and superimposed beta activity which has normal amplitude. A posterior dominant rhythm of only 6-7 Hz is observed, which is symmetric and attenuates normally with eye opening.   Even  during maximal alertness, continuous, waxing and waning, diffuse polymorphic delta slowing is seen, however. .  During this period, there is a continuous periodic pattern of spikes, maximum at P4. These occur with a frequency of ~1 / 1-2 seconds. However, at times, they become associated with focal fast activity in the same region. Moreover, brief periods of acceleration to a rhythmic pattern during which the spikes reach 3 Hz are seen intermittently every 4-6 minutes, lasting 30 seconds to 1 minute.   After 11:22, there is a background change with loss of the posterior dominant rhythm and theta frequencies. The dominant background initially becomes a diffuse poorly variable delta with overriding medium-low voltage diffuse beta. Isolated spikes continue to be seen over the left hemisphere.   Subsequently the background amplitude decreases further, and the beta activity becomes intermittent. Spikes continue to occur. They form short 3-5 second segments of periodic spikes, 1 / second, separated by long periods of 1 spike / 4-10 seconds.  After 15:05, intermittent background suppression occurs as well and after ~16:00 a burst suppression pattern with ~60% suppression ratio on the average is established.    Push Button Events: none  Interpretation: This EEG is indicative of an epileptic irritative zone in the left parietal region.  The EEG opens with a fluctuating ictal-interictal pattern and frequent brief focal seizures, classifiable as NCSE.   Subsequently, there is a progression to burst suppression and after 15:00 the periodic pattern becomes significantly more intermittent and replaced by isolated spikes.  Recording after 18:20 is not available for review.

## 2017-07-25 NOTE — Progress Notes (Signed)
[facility] ADULT ICU REPLACEMENT PROTOCOL FOR AM LAB REPLACEMENT ONLY  The patient does apply for the [facility] Adult ICU Electrolyte Replacment Protocol based on the criteria listed below:   1. Is GFR >/= 40 ml/min? Yes.    Patient's GFR today is >60 2. Is urine output >/= 0.5 ml/kg/hr for the last 6 hours? Yes.   Patient's UOP is .8 ml/kg/hr 3. Is BUN < 60 mg/dL? Yes.    Patient's BUN today is 14 4. Abnormal electrolyte(s):  K- 3.4 5. Ordered repletion with: per protocol 6. If a panic level lab has been reported, has the CCM MD in charge been notified? Yes.  .   Physician:  Dr. Janne Lab, Dixon Boos 07/25/2017 6:53 AM

## 2017-07-25 NOTE — Progress Notes (Signed)
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 He was recently admitted for seizures and was discharged on 4/18 with instructions to start Keppra. I spoke to wife on the phone who states patient has difficulty swallowing the medication and has not been taking it at home.  This AM, wife noted patient was aphasic and had jerking of RUE and proceeded to call EMS. He was in his usual state of health prior to this. In the ED, he was noted to be actively seizing and was loaded with Keppra and given fosphenytoin as well. He continued to seize and required intubation for airway protection and initiation of propofol gtt for burst-suppression.  This morning he is requiring a generous dose of Neo-Synephrine to maintain blood pressure.  He has been sedated to burst suppression on propofol which is currently running at 80 mcg/kg/min.  He is orally intubated and mechanically ventilated and unresponsive   PAST MEDICAL HISTORY :  He  has a past medical history of CAD (coronary artery disease), Diabetes mellitus without complication (HCC), Diastolic dysfunction, ETOH abuse, H/O ventricular fibrillation, Hypertensive heart disease, Intracranial hemorrhage (HCC), Left eye injury, ST elevation MI (STEMI) (HCC) (06/12/15), and Stroke (HCC).  PAST SURGICAL HISTORY: He  has a past surgical history that includes Coronary angioplasty and LEFT HEART CATH AND CORONARY ANGIOGRAPHY (N/A, 06/12/2015).  No Known Allergies  No  current facility-administered medications on file prior to encounter.    Current Outpatient Medications on File Prior to Encounter  Medication Sig  . aspirin 325 MG tablet Take 325 mg by mouth every other day.  . carvedilol (COREG) 6.25 MG tablet Take 1 tablet (6.25 mg total) by mouth 2 (two) times daily with a meal.  . clopidogrel (PLAVIX) 75 MG tablet Take 75 mg by mouth daily.  . insulin NPH Human (HUMULIN N,NOVOLIN N) 100 UNIT/ML injection Inject 0.15 mLs (15 Units total) into the skin 2 (two) times daily before a meal.  . lisinopril-hydrochlorothiazide (PRINZIDE,ZESTORETIC) 20-12.5 MG tablet Take 2 tablets by mouth daily.   . naproxen sodium (ALEVE) 220 MG tablet Take 440 mg by mouth 2 (two) times daily as needed (pain/headache).  Marland Kitchen amLODipine (NORVASC) 10 MG tablet Take 1 tablet (10 mg total) by mouth daily. (Patient not taking: Reported on 2017-08-22)  . atorvastatin (LIPITOR) 40 MG tablet Take 40 mg by mouth daily.   Marland Kitchen levETIRAcetam (KEPPRA) 750 MG tablet Take 1 tablet (750 mg total) by mouth 2 (two) times daily. (Patient not taking: Reported on 08/22/2017)  . Multiple Vitamin (MULTIVITAMIN WITH MINERALS) TABS tablet Take 1 tablet by mouth daily. (Patient not taking: Reported on 07/03/2017)  . nitroGLYCERIN (NITROSTAT) 0.4 MG SL tablet Place 1 tablet (0.4 mg total) under the tongue every 5 (five) minutes as needed for chest pain. (Patient not taking: Reported on 08-22-2017)    FAMILY HISTORY:  His indicated that his mother is deceased. He indicated that his father is deceased. He indicated that his brother is alive. He indicated  that his maternal grandmother is deceased. He indicated that his maternal grandfather is deceased. He indicated that his paternal grandmother is deceased. He indicated that his paternal grandfather is deceased.   SOCIAL HISTORY: He  reports that he has never smoked. He has never used smokeless tobacco. He reports that he does not drink alcohol or use drugs.  REVIEW  OF SYSTEMS:   Unobtainable  SUBJECTIVE:  As above  VITAL SIGNS: BP 132/84   Pulse (!) 58   Temp (!) 97.3 F (36.3 C)   Resp 14   Ht  (1.854 m)   Wt 194 lb 3.6 oz (88.1 kg)   SpO2 98%   BMI 25.62 kg/m   HEMODYNAMICS:    VENTILATOR SETTINGS: Vent Mode: PRVC FiO2 (%):  [40 %] 40 % Set Rate:  [11 bmp-14 bmp] 11 bmp Vt Set:  [550 mL] 550 mL PEEP:  [5 cmH20] 5 cmH20 Plateau Pressure:  [8 cmH20-16 cmH20] 8 cmH20  INTAKE / OUTPUT: I/O last 3 completed shifts: In: 744.5 [I.V.:744.5] Out: 1505 [Urine:1505]  PHYSICAL EXAMINATION: General: Intubated, mechanically ventilated, and in no distress Neuro: No response to voice or sternal rub.  The right pupil is reactive at 3 mm left lobe is posttraumatic Cardiovascular: S1 and S2 are regular without murmur rub or gallop he currently has no dependent edema Lungs: He is not breathing above the set ventilator rate, there is symmetric air movement, no wheezes Abdomen: The abdomen is flat and soft without any obvious masses or tenderness   LABS:  BMET Recent Labs  Lab 07/09/2017 0930 07/13/2017 0941 07/25/17 0229  NA 137 136 138  K 3.7 4.0 3.4*  CL 97* 97* 105  CO2 28  --  25  BUN CREATININE 1.02 0.90 1.27*  GLUCOSE 133* 126* 125*    Electrolytes Recent Labs  Lab 07/06/2017 0930 07/25/17 0229  CALCIUM 9.6 8.8*  MG  --  1.8  PHOS  --  2.6    CBC Recent Labs  Lab 07/25/2017 0930 07/25/2017 0941 07/25/17 0229  WBC 9.9  --  10.4  HGB 13.9 14.6 12.3*  HCT 41.3 43.0 37.5*  PLT 336  --  303    Coag's Recent Labs  Lab 06/30/2017 0930  APTT 30  INR 1.01    Sepsis Markers No results for input(s): LATICACIDVEN, PROCALCITON, O2SATVEN in the last 168 hours.  ABG Recent Labs  Lab 06/27/2017 1329 07/25/17 0349  PHART 7.419 7.524*  PCO2ART 43.1 30.2*  PO2ART 453.0* 161*    Liver Enzymes Recent Labs  Lab 07/22/2017 0930 07/25/17 0229  AST 20 19  ALT 26 20  ALKPHOS 71 58  BILITOT 1.1 1.0  ALBUMIN  3.8 3.2*    Cardiac Enzymes No results for input(s): TROPONINI, PROBNP in the last 168 hours.  Glucose Recent Labs  Lab 07/10/2017 1111 07/18/2017 1613 06/30/2017 2006 07/01/2017 2342 07/25/17 0353 07/25/17 0856  GLUCAP 139* 139* 139* 165* 115* 175*    Imaging Dg Chest Port 1 View  Result Date: 07/25/2017 CLINICAL DATA:  Aspiration pneumonia EXAM: PORTABLE CHEST 1 VIEW COMPARISON:  07/08/2017 FINDINGS: Endotracheal tube and NG tube remain in place, unchanged. Heart is normal size. No confluent airspace opacities or effusions. No acute bony abnormality. IMPRESSION: No active cardiopulmonary disease.  Support devices stable. Electronically Signed   By: Charlett Nose M.D.   On: 07/25/2017 09:41   Dg Chest Port 1 View  Result Date: 07/22/2017 CLINICAL DATA:  Hypoxia EXAM: PORTABLE CHEST  1 VIEW COMPARISON:  July 08, 2017 FINDINGS: Endotracheal tube tip is 3.3 cm above the carina. Nasogastric tube tip and side port are below the diaphragm. No pneumothorax. There is no edema or consolidation. Heart size and pulmonary vascularity are normal. No adenopathy. There is aortic atherosclerosis. There is an apparent stent in a left-sided coronary artery. IMPRESSION: Tube positions as described without pneumothorax. No edema or consolidation. There is aortic atherosclerosis. Aortic Atherosclerosis (ICD10-I70.0). Electronically Signed   By: Bretta Bang III M.D.   On: 07/05/2017 12:06     STUDIES:  Chest x-ray to my eye shows a well-placed endotracheal tube and no infiltrate or CHF  ANTIBIOTICS: None   DISCUSSION:      This is a 71 year old with a prior history of intracranial hemorrhage coronary artery disease type 2 diabetes and a listed history of CHF who presented with seizures.  He had been unable to be compliant with his medicines at home.  Seizures did not respond to initial therapy and he is required sedation to burst suppression for control  ASSESSMENT / PLAN:  PULMONARY A: He is  intubated at this point solely for airway protection.  I have corrected his iatrogenic hyperventilation.   CARDIOVASCULAR A: Continues his Coreg for now.  We will reintroduce diuretics and afterload reduction as required once he is hemodynamically stable   GASTROINTESTINAL A: GI prophylaxis with Protonix   HEMATOLOGIC A: DVT prophylaxis with Lovenox   INFECTIOUS A: No active issues  ENDOCRINE A: Diabetes is being controlled with sliding scale insulin   NEUROLOGIC A: He has been sedated to burst suppression.  He continues baseline antiepileptics with Vimpat Keppra and Dilantin.  Greater than 32 minutes has been spent in the care of this critically ill patient who requires ventilator support and pressors.   Penny Pia, MD Critical Care Medicine [facility] Pager: [phone]  07/25/2017, 11:24 AM

## 2017-07-25 NOTE — Progress Notes (Signed)
Subjective: Overnight, Propofol was decreased to 20, and LPDs resumed, an ictal-interictal pattern.   With increasing propofol this morning, this ceased.   Exam: Vitals:   07/25/17 0800 07/25/17 0900  BP: (!) 86/66 124/81  Pulse: 67 61  Resp: 14 14  Temp: 99.7 F (37.6 C) 98.6 F (37 C)  SpO2: 100% 98%   Gen: In bed, intubated Resp: non-labored breathing, no acute distress Abd: soft, nt  Neuro: Limited by a medically induced coma Pupils are reactive, no response to noxious stimulation  Pertinent Labs: Creatinine 1.27  Impression: 72 year old male with status epilepticus likely secondary to previous intracranial hemorrhage.  He had a similar presentation a month prior, and was on Keppra but there was concern for medication noncompliance.  Recommendations: 1) continue Keppra 1500 twice daily 2) continue Vimpat 200 mg twice daily 3) continue Dilantin 100 mg 3 times daily 4) continue burst suppression for 48 hours from this morning 5) neurology will follow  Ritta Slot, MD [facility] [phone]  If 7pm- 7am, please page neurology on call as listed in AMION.

## 2017-07-26 LAB — BASIC METABOLIC PANEL
ANION GAP: 13 (ref 5–15)
BUN: 10 mg/dL (ref 6–20)
CALCIUM: 8.6 mg/dL — AB (ref 8.9–10.3)
CO2: 19 mmol/L — ABNORMAL LOW (ref 22–32)
Chloride: 107 mmol/L (ref 101–111)
Creatinine, Ser: 1.34 mg/dL — ABNORMAL HIGH (ref 0.61–1.24)
GFR, EST AFRICAN AMERICAN: 59 mL/min — AB (ref >60)
GFR, EST NON AFRICAN AMERICAN: 51 mL/min — AB (ref >60)
GLUCOSE: 111 mg/dL — AB (ref 65–99)
Potassium: 5.3 mmol/L — ABNORMAL HIGH (ref 3.5–5.1)
Sodium: 139 mmol/L (ref 135–145)

## 2017-07-26 LAB — CBC WITH DIFFERENTIAL/PLATELET
BASOS ABS: 0 10*3/uL (ref 0.0–0.1)
BASOS PCT: 1 %
Eosinophils Absolute: 0.5 10*3/uL (ref 0.0–0.7)
Eosinophils Relative: 5 %
HEMATOCRIT: 36.8 % — AB (ref 39.0–52.0)
HEMOGLOBIN: 12 g/dL — AB (ref 13.0–17.0)
LYMPHS PCT: 29 %
Lymphs Abs: 2.5 10*3/uL (ref 0.7–4.0)
MCH: 29.6 pg (ref 26.0–34.0)
MCHC: 32.6 g/dL (ref 30.0–36.0)
MCV: 90.9 fL (ref 78.0–100.0)
MONO ABS: 1 10*3/uL (ref 0.1–1.0)
Monocytes Relative: 12 %
NEUTROS ABS: 4.7 10*3/uL (ref 1.7–7.7)
NEUTROS PCT: 53 %
Platelets: 285 10*3/uL (ref 150–400)
RBC: 4.05 MIL/uL — AB (ref 4.22–5.81)
RDW: 11.8 % (ref 11.5–15.5)
WBC: 8.7 10*3/uL (ref 4.0–10.5)

## 2017-07-26 LAB — BLOOD GAS, ARTERIAL
ACID-BASE DEFICIT: 2.6 mmol/L — AB (ref 0.0–2.0)
BICARBONATE: 20.6 mmol/L (ref 20.0–28.0)
DRAWN BY: 252031
FIO2: 30
MECHVT: 640 mL
O2 Saturation: 98.8 %
PEEP/CPAP: 5 cmH2O
PO2 ART: 130 mmHg — AB (ref 83.0–108.0)
Patient temperature: 98.6
RATE: 11 resp/min
pCO2 arterial: 29.1 mmHg — ABNORMAL LOW (ref 32.0–48.0)
pH, Arterial: 7.463 — ABNORMAL HIGH (ref 7.350–7.450)

## 2017-07-26 LAB — GLUCOSE, CAPILLARY
GLUCOSE-CAPILLARY: 131 mg/dL — AB (ref 65–99)
GLUCOSE-CAPILLARY: 153 mg/dL — AB (ref 65–99)
GLUCOSE-CAPILLARY: 199 mg/dL — AB (ref 65–99)
Glucose-Capillary: 128 mg/dL — ABNORMAL HIGH (ref 65–99)
Glucose-Capillary: 100 mg/dL — ABNORMAL HIGH (ref 65–99)
Glucose-Capillary: 131 mg/dL — ABNORMAL HIGH (ref 65–99)

## 2017-07-26 LAB — MAGNESIUM
MAGNESIUM: 1.9 mg/dL (ref 1.7–2.4)
MAGNESIUM: 2.1 mg/dL (ref 1.7–2.4)

## 2017-07-26 LAB — PHOSPHORUS: PHOSPHORUS: 4.1 mg/dL (ref 2.5–4.6)

## 2017-07-26 MED ORDER — TOPIRAMATE 100 MG PO TABS
200.0000 mg | ORAL_TABLET | Freq: Two times a day (BID) | ORAL | Status: DC
  Administered 2017-07-26 – 2017-08-01 (×13): 200 mg via ORAL
  Filled 2017-07-26 (×13): qty 2

## 2017-07-26 MED ORDER — LORAZEPAM 2 MG/ML IJ SOLN
2.0000 mg | Freq: Once | INTRAMUSCULAR | Status: AC
  Administered 2017-07-26: 2 mg via INTRAVENOUS
  Filled 2017-07-26: qty 1

## 2017-07-26 MED ORDER — PRO-STAT SUGAR FREE PO LIQD
30.0000 mL | Freq: Two times a day (BID) | ORAL | Status: DC
  Administered 2017-07-26 – 2017-07-27 (×2): 30 mL
  Filled 2017-07-26 (×2): qty 30

## 2017-07-26 MED ORDER — MIDAZOLAM BOLUS VIA INFUSION
10.0000 mg | Freq: Once | INTRAVENOUS | Status: AC
  Administered 2017-07-26: 10 mg via INTRAVENOUS
  Filled 2017-07-26: qty 10

## 2017-07-26 MED ORDER — VITAL HIGH PROTEIN PO LIQD
1000.0000 mL | ORAL | Status: DC
  Administered 2017-07-26: 1000 mL

## 2017-07-26 MED ORDER — SODIUM CHLORIDE 0.9 % IV SOLN
15.0000 mg/h | INTRAVENOUS | Status: DC
  Administered 2017-07-26: 5 mg/h via INTRAVENOUS
  Administered 2017-07-26: 15 mg/h via INTRAVENOUS
  Administered 2017-07-26: 5 mg/h via INTRAVENOUS
  Administered 2017-07-27 (×5): 15 mg/h via INTRAVENOUS
  Filled 2017-07-26 (×9): qty 10

## 2017-07-26 MED ORDER — TOPIRAMATE 100 MG PO TABS
100.0000 mg | ORAL_TABLET | Freq: Once | ORAL | Status: AC
  Administered 2017-07-26: 100 mg via ORAL
  Filled 2017-07-26: qty 1

## 2017-07-26 NOTE — Progress Notes (Signed)
LTM maintenance completed. No skin breakdown was seen. 

## 2017-07-26 NOTE — Progress Notes (Signed)
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 He was recently admitted for seizures and was discharged on 4/18 with instructions to start Keppra.  Wife stated that patient has difficulty swallowing the medication and has not been taking it at home.   4/29 wife noted patient was aphasic and had jerking of RUE and proceeded to call EMS. He was in his usual state of health prior to this. In the ED, he was noted to be actively seizing and was loaded with Keppra and given fosphenytoin as well. He continued to seize and required intubation for airway protection and initiation of propofol gtt for burst-suppression.   PAST MEDICAL HISTORY :  He  has a past medical history of CAD (coronary artery disease), Diabetes mellitus without complication (HCC), Diastolic dysfunction, ETOH abuse, H/O ventricular fibrillation, Hypertensive heart disease, Intracranial hemorrhage (HCC), Left eye injury, ST elevation MI (STEMI) (HCC) (06/12/15), and Stroke (HCC).  PAST SURGICAL HISTORY: He  has a past surgical history that includes Coronary angioplasty and LEFT HEART CATH AND CORONARY ANGIOGRAPHY (N/A, 06/12/2015).  No Known Allergies  No current facility-administered medications on file prior to encounter.    Current Outpatient Medications on File Prior to Encounter  Medication Sig  . aspirin 325 MG tablet Take 325 mg by mouth every other day.  . carvedilol (COREG) 6.25 MG tablet Take 1 tablet (6.25 mg total) by  mouth 2 (two) times daily with a meal.  . clopidogrel (PLAVIX) 75 MG tablet Take 75 mg by mouth daily.  . insulin NPH Human (HUMULIN N,NOVOLIN N) 100 UNIT/ML injection Inject 0.15 mLs (15 Units total) into the skin 2 (two) times daily before a meal.  . lisinopril-hydrochlorothiazide (PRINZIDE,ZESTORETIC) 20-12.5 MG tablet Take 2 tablets by mouth daily.   . naproxen sodium (ALEVE) 220 MG tablet Take 440 mg by mouth 2 (two) times daily as needed (pain/headache).  Marland Kitchen amLODipine (NORVASC) 10 MG tablet Take 1 tablet (10 mg total) by mouth daily. (Patient not taking: Reported on 07/23/2017)  . atorvastatin (LIPITOR) 40 MG tablet Take 40 mg by mouth daily.   Marland Kitchen levETIRAcetam (KEPPRA) 750 MG tablet Take 1 tablet (750 mg total) by mouth 2 (two) times daily. (Patient not taking: Reported on 07/17/2017)  . Multiple Vitamin (MULTIVITAMIN WITH MINERALS) TABS tablet Take 1 tablet by mouth daily. (Patient not taking: Reported on 07/03/2017)  . nitroGLYCERIN (NITROSTAT) 0.4 MG SL tablet Place 1 tablet (0.4 mg total) under the tongue every 5 (five) minutes as needed for chest pain. (Patient not taking: Reported on 07/09/2017)    FAMILY HISTORY:  His indicated that his mother is deceased. He indicated that his father is deceased. He indicated that his brother is alive. He indicated that his maternal grandmother is deceased. He indicated that his maternal grandfather is deceased. He indicated that his paternal grandmother is deceased. He indicated that his paternal grandfather is deceased.   SOCIAL HISTORY: He  reports that he has never smoked. He has never used smokeless  tobacco. He reports that he does not drink alcohol or use drugs.  REVIEW OF SYSTEMS:   Unobtainable  SUBJECTIVE:  Presently on day 1 of 2 of burst suppression.   VITAL SIGNS: BP (!) 143/88   Pulse (!) 59   Temp 97.9 F (36.6 C)   Resp 11   Ht  (1.854 m)   Wt 194 lb 14.2 oz (88.4 kg)   SpO2 100%   BMI 25.71 kg/m   HEMODYNAMICS:     VENTILATOR SETTINGS: Vent Mode: PRVC FiO2 (%):  [30 %] 30 % Set Rate:  [11 bmp] 11 bmp Vt Set:  [640 mL] 640 mL PEEP:  [5 cmH20] 5 cmH20 Plateau Pressure:  [15 cmH20-17 cmH20] 16 cmH20  INTAKE / OUTPUT: I/O last 3 completed shifts: In: 3426.3 [I.V.:2946.3; IV Piggyback:480] Out: 1455 [Urine:1455]  PHYSICAL EXAMINATION: General: Intubated, mechanically ventilated, and in no distress Neuro: No response to voice or sternal rub.  The right pupil is reactive at 3 mm left lobe is posttraumatic Cardiovascular: S1 and S2 are regular without murmur rub or gallop he currently has no dependent edema Lungs: He is not breathing above the set ventilator rate, there is symmetric air movement, no wheezes Abdomen: The abdomen is flat and soft without any obvious masses or tenderness   LABS:  BMET Recent Labs  Lab 07/19/2017 0930 07/09/2017 0941 07/25/17 0229 07/26/17 0112  NA 137 136 138 139  K 3.7 4.0 3.4* 5.3*  CL 97* 97* 105 107  CO2 28  --  25 19*  BUN CREATININE 1.02 0.90 1.27* 1.34*  GLUCOSE 133* 126* 125* 111*    Electrolytes Recent Labs  Lab 07/01/2017 0930 07/25/17 0229 07/26/17 0112  CALCIUM 9.6 8.8* 8.6*  MG  --  1.8 1.9  PHOS  --  2.6  --     CBC Recent Labs  Lab 07/25/2017 0930 07/21/2017 0941 07/25/17 0229 07/26/17 0536  WBC 9.9  --  10.4 8.7  HGB 13.9 14.6 12.3* 12.0*  HCT 41.3 43.0 37.5* 36.8*  PLT 336  --  303 285    Coag's Recent Labs  Lab 07/04/2017 0930  APTT 30  INR 1.01    Sepsis Markers No results for input(s): LATICACIDVEN, PROCALCITON, O2SATVEN in the last 168 hours.  ABG Recent Labs  Lab 07/25/17 0349 07/25/17 1404 07/26/17 0245  PHART 7.524* 7.435 7.463*  PCO2ART 30.2* 34.3 29.1*  PO2ART 161* 182.0* 130*    Liver Enzymes Recent Labs  Lab 07/15/2017 0930 07/25/17 0229  AST 20 19  ALT 26 20  ALKPHOS 71 58  BILITOT 1.1 1.0  ALBUMIN 3.8 3.2*    Cardiac Enzymes No results for input(s): TROPONINI, PROBNP in the  last 168 hours.  Glucose Recent Labs  Lab 07/25/17 1609 07/25/17 2004 07/25/17 2346 07/26/17 0330 07/26/17 0826 07/26/17 1115  GLUCAP 127* 94 115* 128* 153* 131*    Imaging No results found.   STUDIES:  Chest x-ray to my eye shows a well-placed endotracheal tube and no infiltrate or CHF  ANTIBIOTICS: None   DISCUSSION:      This is a 72 year old with a prior history of intracranial hemorrhage coronary artery disease type 2 diabetes and a listed history of CHF who presented with seizures.  He had been unable to be compliant with his medicines at home.  Seizures did not respond to initial therapy and he is required sedation to burst suppression for control.  ASSESSMENT / PLAN:  PULMONARY A:  He is intubated at this point solely for airway protection.  SBT once more awake.  CARDIOVASCULAR A: Hypotension requiring Neosynephrine to counteract effects of propofol. Bradycardia. Hold all anti-hypertensive medications at this time.  GASTROINTESTINAL A: GI prophylaxis with Protonix. Start feeds. At moderate nutritional risk.  HEMATOLOGIC A: DVT prophylaxis with Lovenox   INFECTIOUS A: No active issues  ENDOCRINE A: Diabetes is being controlled with sliding scale insulin   NEUROLOGIC A: He has been sedated to burst suppression.  He continues baseline antiepileptics with Vimpat Keppra and Dilantin.  D/W Neurology: will give another day of burst suppression.   Patient remains critically ill due to status epilepticus requiring titration of IV sedatives and respiratory failure requiring mechanical ventilation.  Patient remains at high risk for clinical decompensation.  Total critical care time excluding separately billable procedures: 30 minutes.  Lynnell Catalan, MD Critical Care Medicine [facility] Pager: [phone]   [phone]  07/26/2017, 4:01 PM

## 2017-07-26 NOTE — Progress Notes (Addendum)
Subjective: Initially had good response to propofol at 80, but subsequently developed PLEDs returning on the side  Exam: Vitals:   07/26/17 1540 07/26/17 1545  BP: 139/86 (!) 143/88  Pulse: (!) 59 (!) 59  Resp: 11 11  Temp:  97.9 F (36.6 C)  SpO2: 100% 100%   Gen: In bed, intubated Resp: non-labored breathing, no acute distress Abd: soft, nt  Neuro: Limited by a medically induced coma Pupils are reactive, no response to noxious stimulation  Pertinent Labs: Creatinine 1.27  Impression: 72 year old male with status epilepticus likely secondary to previous intracranial hemorrhage.  He had a similar presentation a month prior, and was on Keppra but there was concern for medication noncompliance.  He continues to have frequent discharges in the left side, I am concerned that with lightening sedation he would go right back into status epilepticus.  I will add topiramate, still plan to lighten sedation tomorrow  Rather than continue increasing propofol given how long he has been on it, I would rather add midozalam for synergistic effect.  Recommendations: 1) continue Keppra 1500 twice daily 2) continue Vimpat 200 mg twice daily 3) continue Dilantin 100 mg 3 times daily 4) topiramate 200 mill grams twice daily 5) continue burst suppression 3 tomorrow 6) neurology will follow  Ritta Slot, MD [facility] [phone]  If 7pm- 7am, please page neurology on call as listed in AMION.

## 2017-07-26 NOTE — Progress Notes (Signed)
Versed gtt rate changed per MD Wilford Corner verbal at bedside. RN will continue to monitor.

## 2017-07-26 NOTE — Procedures (Signed)
  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    EEG Classification  Abnormal, Coma    There is no PDR  HR  60 bpm and regular    Background abnormalities:   1. Continuous slow <--> Burst suppression, generalized     Periodic, rhythmic or epileptiform abnormalities:   1. Sharp waves <--> Sharp LPDs, left hemisphere   Ictal phenomena:  none    EEG DETAILS:  TYPE OF RECORDING: At least 18-channel digital EEG with using a standard international 10-20 placement with additional EEG electrodes, and 1 additional channel dedicated to the EKG, at a sampling rate of at least 256 Hz. Video was recorded throughout the study. The recording was interpreted using digital review software allowing for montage reformatting, gain and filter changes. Each page was reviewed manually. Automatic spike and seizure detection software and quantitative analysis tools were used as needed.   Description of EEG features: The recording opens with generalized burst suppression with bursts of low amplitude theta-delta activity of up to 3 seconds and interburst intervals of up to 10 seconds.   Within the bursts, high amplitude sharp waves are seen with a broad left hemispheric field. They occasionally repeat 1-2 / second within a burst.  Around 20:00 the degree of suppression decreases with more prolonged bursts of repeated sharp waves lasting up to 5-6 seconds and interburst intervals as low as 4 seconds. After 23:00 periods of continuous EEG background become more frequent.   During these periods, a well sustained periodic pattern of polyphasic sharp waves occurring ~ 1 / second is  seen.   Push Button Events: none  Interpretation: This EEG is indicative of an persistently very active epileptic irritative zone in the left parietal region.  The EEG opens with burst suppression pattern during which occasional sharp waves are still present within the bursts. Starting at 20:00, and especially after 23:00, the degree of suppression decreases and a continuous EEG is seen with and intermittent periodic pattern of sharp waves (PLEDs) seen on the left. This lies on the ictal-interictal continuum and indicates a very high risk of seizure recurrence.

## 2017-07-26 DEATH — deceased

## 2017-07-27 DIAGNOSIS — G40901 Epilepsy, unspecified, not intractable, with status epilepticus: Secondary | ICD-10-CM

## 2017-07-27 DIAGNOSIS — G40911 Epilepsy, unspecified, intractable, with status epilepticus: Secondary | ICD-10-CM | POA: Diagnosis not present

## 2017-07-27 LAB — MAGNESIUM
MAGNESIUM: 2 mg/dL (ref 1.7–2.4)
MAGNESIUM: 2 mg/dL (ref 1.7–2.4)

## 2017-07-27 LAB — GLUCOSE, CAPILLARY
GLUCOSE-CAPILLARY: 219 mg/dL — AB (ref 65–99)
GLUCOSE-CAPILLARY: 220 mg/dL — AB (ref 65–99)
Glucose-Capillary: 184 mg/dL — ABNORMAL HIGH (ref 65–99)
Glucose-Capillary: 186 mg/dL — ABNORMAL HIGH (ref 65–99)
Glucose-Capillary: 187 mg/dL — ABNORMAL HIGH (ref 65–99)

## 2017-07-27 LAB — PHOSPHORUS
PHOSPHORUS: 3.8 mg/dL (ref 2.5–4.6)
Phosphorus: 3.8 mg/dL (ref 2.5–4.6)

## 2017-07-27 LAB — TRIGLYCERIDES: Triglycerides: 1095 mg/dL — ABNORMAL HIGH (ref <150)

## 2017-07-27 LAB — PHENYTOIN LEVEL, TOTAL: Phenytoin Lvl: 18.1 ug/mL (ref 10.0–20.0)

## 2017-07-27 MED ORDER — SODIUM CHLORIDE 0.9 % IV SOLN
25.0000 mg/h | INTRAVENOUS | Status: DC
  Administered 2017-07-27 – 2017-07-28 (×2): 25 mg/h via INTRAVENOUS
  Administered 2017-07-28: 30 mg/h via INTRAVENOUS
  Administered 2017-07-28: 25 mg/h via INTRAVENOUS
  Administered 2017-07-28 (×2): 20 mg/h via INTRAVENOUS
  Filled 2017-07-27 (×7): qty 20

## 2017-07-27 MED ORDER — SODIUM CHLORIDE 0.9 % IV SOLN
20.0000 mg/h | INTRAVENOUS | Status: DC
  Administered 2017-07-27: 25 mg/h via INTRAVENOUS
  Filled 2017-07-27: qty 10

## 2017-07-27 MED ORDER — PRO-STAT SUGAR FREE PO LIQD
30.0000 mL | Freq: Every day | ORAL | Status: DC
  Administered 2017-07-28 – 2017-08-01 (×5): 30 mL
  Filled 2017-07-27 (×5): qty 30

## 2017-07-27 MED ORDER — ORAL CARE MOUTH RINSE
15.0000 mL | OROMUCOSAL | Status: DC
  Administered 2017-07-27 – 2017-08-01 (×54): 15 mL via OROMUCOSAL

## 2017-07-27 MED ORDER — PROPOFOL 1000 MG/100ML IV EMUL
5.0000 ug/kg/min | INTRAVENOUS | Status: DC
  Administered 2017-07-27: 70 ug/kg/min via INTRAVENOUS
  Administered 2017-07-27: 40 ug/kg/min via INTRAVENOUS
  Filled 2017-07-27 (×4): qty 100

## 2017-07-27 MED ORDER — MIDAZOLAM BOLUS VIA INFUSION
10.0000 mg | INTRAVENOUS | Status: AC
  Administered 2017-07-27 (×3): 10 mg via INTRAVENOUS
  Filled 2017-07-27 (×3): qty 10

## 2017-07-27 MED ORDER — VITAL AF 1.2 CAL PO LIQD
1000.0000 mL | ORAL | Status: DC
  Administered 2017-07-27 – 2017-07-31 (×6): 1000 mL
  Filled 2017-07-27 (×2): qty 1000

## 2017-07-27 MED ORDER — MIDAZOLAM BOLUS VIA INFUSION
10.0000 mg | INTRAVENOUS | Status: AC
  Administered 2017-07-27 (×2): 10 mg via INTRAVENOUS
  Filled 2017-07-27 (×2): qty 10

## 2017-07-27 NOTE — Progress Notes (Signed)
MD notified of patient activity on EEG monitor. RN will continue to monitor.

## 2017-07-27 NOTE — Progress Notes (Signed)
Versed gtt increased and bolus given time three per MD order. RN will continue to monitor.

## 2017-07-27 NOTE — Progress Notes (Addendum)
Subjective: Good burst suppression overnight  Exam: Vitals:   07/27/17 1730 07/27/17 1800  BP: 110/72 113/70  Pulse: 63 62  Resp: 11 11  Temp: 99 F (37.2 C) 99 F (37.2 C)  SpO2: 100% 100%   Gen: In bed, intubated Resp: non-labored breathing, no acute distress Abd: soft, nt  Neuro: Limited by a medically induced coma Pupils are reactive, no response to noxious stimulation  Pertinent Labs: Creatinine 1.27  Impression: 72 year old male with status epilepticus likely secondary to previous intracranial hemorrhage.  He had a similar presentation a month prior, and was on Keppra but was unable to swallow the tablets and they had not been able to get a provider to call in the liquid.    Recommendations: 1) continue Keppra 1500 twice daily 2) continue Vimpat 200 mg twice daily 3) continue Dilantin 100 mg 3 times daily 4) topiramate 200 mill grams twice daily 5) begin lightening sedation 6) neurology will follow  This patient is critically ill and at significant risk of neurological worsening, death and care requires constant monitoring of vital signs, hemodynamics,respiratory and cardiac monitoring, neurological assessment, discussion with family, other specialists and medical decision making of high complexity. I spent 40 minutes of neurocritical care time  in the care of  this patient.  Ritta Slot, MD [facility] [phone]  If 7pm- 7am, please page neurology on call as listed in AMION. 07/27/2017  6:30 PM

## 2017-07-27 NOTE — Progress Notes (Signed)
EEG maint complete. No skin breakdown. Continue to monitor 

## 2017-07-27 NOTE — Progress Notes (Signed)
PULMONARY / CRITICAL CARE MEDICINE   Name: Richard [name] MRN: [id number] DOB: 1945/07/21    ADMISSION DATE:  07/10/2017   CHIEF COMPLAINT:  Status epilepticus  HISTORY OF PRESENT ILLNESS:    Richard [name] is a 72 yo M with history of L frontal stroke, intracranial hemorrhage 2016, CAD, STEMI s/p PCI, VFib arrest, HFpEF (no recent echo), T2DM, and recent diagnosis of complex partial seizure thought to be secondary to L parietal encephalomalacia who presented to the ED with acute encephalopathy thought to be due to acute CVA but found to be seizing. He was recently admitted for seizures and was discharged on 4/18 with instructions to start Keppra.  Wife stated that patient has difficulty swallowing the medication and has not been taking it at home.   4/29 wife noted patient was aphasic and had jerking of RUE and proceeded to call EMS. He was in his usual state of health prior to this. In the ED, he was noted to be actively seizing and was loaded with Keppra and given fosphenytoin as well. He continued to seize and required intubation for airway protection and initiation of propofol gtt for burst-suppression.   PAST MEDICAL HISTORY :  He  has a past medical history of CAD (coronary artery disease), Diabetes mellitus without complication (HCC), Diastolic dysfunction, ETOH abuse, H/O ventricular fibrillation, Hypertensive heart disease, Intracranial hemorrhage (HCC), Left eye injury, ST elevation MI (STEMI) (HCC) (06/12/15), and Stroke (HCC).  PAST SURGICAL HISTORY: He  has a past surgical history that includes Coronary angioplasty and LEFT HEART CATH AND CORONARY ANGIOGRAPHY (N/A, 06/12/2015).  No Known Allergies  No current facility-administered medications on file prior to encounter.    Current Outpatient Medications on File Prior to Encounter  Medication Sig  . aspirin 325 MG tablet Take 325 mg by mouth every other day.  . carvedilol (COREG) 6.25 MG tablet Take 1 tablet (6.25 mg total) by  mouth 2 (two) times daily with a meal.  . clopidogrel (PLAVIX) 75 MG tablet Take 75 mg by mouth daily.  . insulin NPH Human (HUMULIN N,NOVOLIN N) 100 UNIT/ML injection Inject 0.15 mLs (15 Units total) into the skin 2 (two) times daily before a meal.  . lisinopril-hydrochlorothiazide (PRINZIDE,ZESTORETIC) 20-12.5 MG tablet Take 2 tablets by mouth daily.   . naproxen sodium (ALEVE) 220 MG tablet Take 440 mg by mouth 2 (two) times daily as needed (pain/headache).  Marland Kitchen amLODipine (NORVASC) 10 MG tablet Take 1 tablet (10 mg total) by mouth daily. (Patient not taking: Reported on 07/08/2017)  . atorvastatin (LIPITOR) 40 MG tablet Take 40 mg by mouth daily.   Marland Kitchen levETIRAcetam (KEPPRA) 750 MG tablet Take 1 tablet (750 mg total) by mouth 2 (two) times daily. (Patient not taking: Reported on 07/18/2017)  . Multiple Vitamin (MULTIVITAMIN WITH MINERALS) TABS tablet Take 1 tablet by mouth daily. (Patient not taking: Reported on 07/03/2017)  . nitroGLYCERIN (NITROSTAT) 0.4 MG SL tablet Place 1 tablet (0.4 mg total) under the tongue every 5 (five) minutes as needed for chest pain. (Patient not taking: Reported on 07/20/2017)    FAMILY HISTORY:  His indicated that his mother is deceased. He indicated that his father is deceased. He indicated that his brother is alive. He indicated that his maternal grandmother is deceased. He indicated that his maternal grandfather is deceased. He indicated that his paternal grandmother is deceased. He indicated that his paternal grandfather is deceased.   SOCIAL HISTORY: He  reports that he has never smoked. He has never used smokeless  tobacco. He reports that he does not drink alcohol or use drugs.  REVIEW OF SYSTEMS:   Unobtainable  SUBJECTIVE:  Presently on day 1 of 2 of burst suppression.   VITAL SIGNS: BP 117/79   Pulse (!) 59   Temp (!) 97.5 F (36.4 C)   Resp 11   Ht  (1.854 m)   Wt 200 lb 9.9 oz (91 kg)   SpO2 100%   BMI 26.47 kg/m   HEMODYNAMICS:   Remains on phenylephrine 20mcg/min  VENTILATOR SETTINGS: Vent Mode: PRVC FiO2 (%):  [30 %] 30 % Set Rate:  [11 bmp] 11 bmp Vt Set:  [640 mL] 640 mL PEEP:  [5 cmH20] 5 cmH20 Plateau Pressure:  [14 cmH20-17 cmH20] 15 cmH20  INTAKE / OUTPUT: I/O last 3 completed shifts: In: 4366.6 [I.V.:3457.6; Other:125; NG/GT:304; IV Piggyback:480] Out: 2475 [Urine:2175; Emesis/NG output:300]  PHYSICAL EXAMINATION: General: Intubated, mechanically ventilated, and in no distress Neuro: No response to voice or sternal rub.  The right pupil is reactive at 3 mm left lobe is posttraumatic Cardiovascular: S1 and S2 are regular without murmur rub or gallop he currently has no dependent edema Lungs: He is not breathing above the set ventilator rate, there is symmetric air movement, no wheezes Abdomen: The abdomen is flat and soft without any obvious masses or tenderness   LABS:  BMET Recent Labs  Lab 07/13/2017 0930 07/17/2017 0941 07/25/17 0229 07/26/17 0112  NA 137 136 138 139  K 3.7 4.0 3.4* 5.3*  CL 97* 97* 105 107  CO2 28  --  25 19*  BUN CREATININE 1.02 0.90 1.27* 1.34*  GLUCOSE 133* 126* 125* 111*    Electrolytes Recent Labs  Lab 07/10/2017 0930  07/25/17 0229 07/26/17 0112 07/26/17 1727 07/27/17 0425  CALCIUM 9.6  --  8.8* 8.6*  --   --   MG  --    < > 1.8 1.9 2.1 2.0  PHOS  --   --  2.6  --  4.1 3.8   < > = values in this interval not displayed.    CBC Recent Labs  Lab 07/14/2017 0930 07/04/2017 0941 07/25/17 0229 07/26/17 0536  WBC 9.9  --  10.4 8.7  HGB 13.9 14.6 12.3* 12.0*  HCT 41.3 43.0 37.5* 36.8*  PLT 336  --  303 285    Coag's Recent Labs  Lab 06/29/2017 0930  APTT 30  INR 1.01    Sepsis Markers No results for input(s): LATICACIDVEN, PROCALCITON, O2SATVEN in the last 168 hours.  ABG Recent Labs  Lab 07/25/17 0349 07/25/17 1404 07/26/17 0245  PHART 7.524* 7.435 7.463*  PCO2ART 30.2* 34.3 29.1*  PO2ART 161* 182.0* 130*    Liver  Enzymes Recent Labs  Lab 07/01/2017 0930 07/25/17 0229  AST 20 19  ALT 26 20  ALKPHOS 71 58  BILITOT 1.1 1.0  ALBUMIN 3.8 3.2*    Cardiac Enzymes No results for input(s): TROPONINI, PROBNP in the last 168 hours.  Glucose Recent Labs  Lab 07/26/17 1115 07/26/17 1604 07/26/17 1957 07/26/17 2357 07/27/17 0351 07/27/17 0726  GLUCAP 131* 100* 131* 199* 184* 186*    Imaging No results found.   STUDIES:  Chest x-ray to my eye shows a well-placed endotracheal tube and no infiltrate or CHF  ANTIBIOTICS: None   DISCUSSION:      This is a 71 year old with a prior history of intracranial hemorrhage coronary artery disease type 2 diabetes and a listed history of  CHF who presented with seizures.  He had been unable to be compliant with his medicines at home.  Seizures did not respond to initial therapy and he is required sedation to burst suppression for control.  ASSESSMENT / PLAN:  PULMONARY A: He is intubated at this point solely for airway protection.  SBT once more awake.  CARDIOVASCULAR A: Hypotension requiring Neosynephrine to counteract effects of propofol. Bradycardia. Hold all anti-hypertensive medications at this time.  GASTROINTESTINAL A: GI prophylaxis with Protonix. Start feeds. At moderate nutritional risk.  HEMATOLOGIC A: DVT prophylaxis with Lovenox   INFECTIOUS A: No active issues  ENDOCRINE A: Diabetes is being controlled with sliding scale insulin   NEUROLOGIC A: He has been sedated to burst suppression.  He continues baseline antiepileptics with Vimpat Keppra and Dilantin.   Wean propofol off today.  If no evidence of recurrent seizure activity, will start cutting back midazolam.  Patient remains critically ill due to status epilepticus requiring titration of IV sedatives and respiratory failure requiring mechanical ventilation.  Patient remains at high risk for clinical decompensation.  Total critical care time excluding separately billable  procedures: 35 minutes.  Lynnell Catalan, MD Critical Care Medicine [facility] Pager: [phone]   [phone]  07/27/2017, 11:11 AM

## 2017-07-27 NOTE — Procedures (Signed)
  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    EEG Classification  Abnormal, Coma    There is no PDR  HR  60 bpm and regular    Background abnormalities:   1. Continuous slow --> Burst suppression, generalized     Periodic, rhythmic or epileptiform abnormalities:   1. Sharp wave, left hemisphere   Ictal phenomena:  none    EEG DETAILS:  TYPE OF RECORDING: At least 18-channel digital EEG with using a standard international 10-20 placement with additional EEG electrodes, and 1 additional channel dedicated to the EKG, at a sampling rate of at least 256 Hz. Video was recorded throughout the study. The recording was interpreted using digital review software allowing for montage reformatting, gain and filter changes. Each page was reviewed manually. Automatic spike and seizure detection software and quantitative analysis tools were used as needed.   Description of EEG features: The recording almost continuously shows burst suppression with bursts of low amplitude theta-delta activity 3-4 seconds and interburst intervals of up to 10 seconds.   Within the bursts, occasionally, sharp waves are seen with a broad left hemispheric field.   Push Button Events: none  Interpretation: This EEG is indicative of an epileptic irritative zone in the left parietal region. The EEG shows a burst suppression pattern during which occasional sharp waves are still present within the bursts. This is largely stable throughout the epoch.

## 2017-07-27 NOTE — Progress Notes (Signed)
Initial Nutrition Assessment  DOCUMENTATION CODES:   Not applicable  INTERVENTION:   D/C Vital High Protein  Vital AF 1.2 @ 60 ml/hr (1440 ml/day) 30 ml Prostat daily  Provides: 1828 kcal, 123 grams protein, and 1167 ml free water.    NUTRITION DIAGNOSIS:   Inadequate oral intake related to inability to eat as evidenced by NPO status.  GOAL:   Patient will meet greater than or equal to 90% of their needs  MONITOR:   TF tolerance, Vent status  REASON FOR ASSESSMENT:   Consult Enteral/tube feeding initiation and management  ASSESSMENT:   Pt with PMH of L frontal stroke, ICH 2016, HF, STEMI s/p PCI, Vfib arrest, DM and recent dx of complex partial seizure who was not taking his Keppra at home after d/c and was admitted with AMS and seizures.    Pt discussed during ICU rounds and with RN.  Per RN propofol decreasing by 10 every hr, suspect to be d/c'ed since TG high.   Patient is currently intubated on ventilator support MV: 7.1 L/min Temp (24hrs), Avg:97.7 F (36.5 C), Min:97.2 F (36.2 C), Max:98.4 F (36.9 C)  Propofol: 38.4 ml/hr provides: 1013 kcal per day from lipid Medications reviewed and include: 1-3 units novolog every 4 hrs, thiamine 100 mg daily, neo-synephrine 28 mcg Labs reviewed: TG: 1095 (H)   TF: Vital High Protein @ 40 ml/hr with 30 ml Prostat BID Provides: 1160 kcal and 114 grams protein  NUTRITION - FOCUSED PHYSICAL EXAM:    Most Recent Value  Orbital Region  Mild depletion  Upper Arm Region  No depletion  Thoracic and Lumbar Region  No depletion  Buccal Region  Unable to assess  Temple Region  Severe depletion  Clavicle Bone Region  No depletion  Clavicle and Acromion Bone Region  No depletion  Scapular Bone Region  Unable to assess  Dorsal Hand  Unable to assess  Patellar Region  No depletion  Anterior Thigh Region  No depletion  Posterior Calf Region  No depletion  Edema (RD Assessment)  None  Hair  Reviewed  Eyes  Unable to  assess  Mouth  Unable to assess  Skin  Reviewed  Nails  Reviewed       Diet Order:   Diet Order           Diet NPO time specified  Diet effective now          EDUCATION NEEDS:   No education needs have been identified at this time  Skin:  Skin Assessment: Reviewed RN Assessment  Last BM:  unknown  Height:   Ht Readings from Last 1 Encounters:  08-01-17  (1.854 m)    Weight:   Wt Readings from Last 1 Encounters:  07/27/17 200 lb 9.9 oz (91 kg)    Ideal Body Weight:  83.6 kg  BMI:  Body mass index is 26.47 kg/m.  Estimated Nutritional Needs:   Kcal:  1820  Protein:  110-125 grams  Fluid:  > 2 L/day  Kendell Bane RD, LDN, CNSC [phone] Pager [phone] After Hours Pager

## 2017-07-28 LAB — GLUCOSE, CAPILLARY
GLUCOSE-CAPILLARY: 262 mg/dL — AB (ref 65–99)
GLUCOSE-CAPILLARY: 165 mg/dL — AB (ref 65–99)
GLUCOSE-CAPILLARY: 178 mg/dL — AB (ref 65–99)
GLUCOSE-CAPILLARY: 140 mg/dL — AB (ref 65–99)
Glucose-Capillary: 133 mg/dL — ABNORMAL HIGH (ref 65–99)
Glucose-Capillary: 139 mg/dL — ABNORMAL HIGH (ref 65–99)
Glucose-Capillary: 140 mg/dL — ABNORMAL HIGH (ref 65–99)
Glucose-Capillary: 142 mg/dL — ABNORMAL HIGH (ref 65–99)
Glucose-Capillary: 145 mg/dL — ABNORMAL HIGH (ref 65–99)
Glucose-Capillary: 145 mg/dL — ABNORMAL HIGH (ref 65–99)
Glucose-Capillary: 148 mg/dL — ABNORMAL HIGH (ref 65–99)
Glucose-Capillary: 154 mg/dL — ABNORMAL HIGH (ref 65–99)
Glucose-Capillary: 181 mg/dL — ABNORMAL HIGH (ref 65–99)
Glucose-Capillary: 228 mg/dL — ABNORMAL HIGH (ref 65–99)
Glucose-Capillary: 238 mg/dL — ABNORMAL HIGH (ref 65–99)
Glucose-Capillary: 240 mg/dL — ABNORMAL HIGH (ref 65–99)
Glucose-Capillary: 250 mg/dL — ABNORMAL HIGH (ref 65–99)
Glucose-Capillary: 251 mg/dL — ABNORMAL HIGH (ref 65–99)
Glucose-Capillary: 256 mg/dL — ABNORMAL HIGH (ref 65–99)
Glucose-Capillary: 269 mg/dL — ABNORMAL HIGH (ref 65–99)
Glucose-Capillary: 274 mg/dL — ABNORMAL HIGH (ref 65–99)

## 2017-07-28 LAB — TRIGLYCERIDES: TRIGLYCERIDES: 481 mg/dL — AB (ref <150)

## 2017-07-28 LAB — MAGNESIUM: MAGNESIUM: 2 mg/dL (ref 1.7–2.4)

## 2017-07-28 LAB — PHOSPHORUS: PHOSPHORUS: 2.8 mg/dL (ref 2.5–4.6)

## 2017-07-28 MED ORDER — SODIUM CHLORIDE 0.9 % IV SOLN
INTRAVENOUS | Status: DC
  Administered 2017-07-28: 2 [IU]/h via INTRAVENOUS
  Filled 2017-07-28 (×2): qty 1

## 2017-07-28 MED ORDER — INSULIN GLARGINE 100 UNIT/ML ~~LOC~~ SOLN
5.0000 [IU] | Freq: Two times a day (BID) | SUBCUTANEOUS | Status: DC
  Administered 2017-07-28 – 2017-07-29 (×2): 5 [IU] via SUBCUTANEOUS
  Administered 2017-07-29: 3 [IU] via SUBCUTANEOUS
  Filled 2017-07-28 (×3): qty 0.05

## 2017-07-28 MED ORDER — INSULIN ASPART 100 UNIT/ML ~~LOC~~ SOLN
2.0000 [IU] | SUBCUTANEOUS | Status: DC
  Administered 2017-07-28: 6 [IU] via SUBCUTANEOUS

## 2017-07-28 MED ORDER — PROPOFOL 1000 MG/100ML IV EMUL
5.0000 ug/kg/min | INTRAVENOUS | Status: DC
  Administered 2017-07-28: 40 ug/kg/min via INTRAVENOUS

## 2017-07-28 MED ORDER — SODIUM CHLORIDE 0.9 % IV SOLN
Freq: Once | INTRAVENOUS | Status: AC
  Administered 2017-07-28: 12:00:00 via INTRAVENOUS
  Filled 2017-07-28: qty 100

## 2017-07-28 MED ORDER — INSULIN ASPART 100 UNIT/ML ~~LOC~~ SOLN: 3.0000 [IU] | SUBCUTANEOUS | Status: DC

## 2017-07-28 MED ORDER — INSULIN REGULAR HUMAN 100 UNIT/ML IJ SOLN
INTRAMUSCULAR | Status: DC
  Administered 2017-07-28: 6 [IU]/h via INTRAVENOUS
  Filled 2017-07-28 (×2): qty 1

## 2017-07-28 MED ORDER — INSULIN GLARGINE 100 UNIT/ML ~~LOC~~ SOLN
5.0000 [IU] | Freq: Two times a day (BID) | SUBCUTANEOUS | Status: DC
  Filled 2017-07-28: qty 0.05

## 2017-07-28 MED ORDER — INSULIN ASPART 100 UNIT/ML ~~LOC~~ SOLN
0.0000 [IU] | SUBCUTANEOUS | Status: DC
  Administered 2017-07-29: 5 [IU] via SUBCUTANEOUS
  Administered 2017-07-29: 2 [IU] via SUBCUTANEOUS
  Administered 2017-07-29: 5 [IU] via SUBCUTANEOUS

## 2017-07-28 MED ORDER — PHENOBARBITAL SODIUM 130 MG/ML IJ SOLN: 1500.0000 mg | Freq: Once | INTRAMUSCULAR | Status: DC

## 2017-07-28 NOTE — Progress Notes (Signed)
LTM EEG checked, paste added to C3, C4, P4. No skin breakdown noted, FP1, FP2, A1 and A2 checked

## 2017-07-28 NOTE — Procedures (Signed)
  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    EEG Classification  Abnormal, Coma    There is no PDR  HR  60 bpm and regular    Background abnormalities:   1. Burst suppression, generalized     Periodic, rhythmic or epileptiform abnormalities:   1. Sharp wave, left hemisphere   Ictal phenomena:  none    EEG DETAILS:  TYPE OF RECORDING: At least 18-channel digital EEG with using a standard international 10-20 placement with additional EEG electrodes, and 1 additional channel dedicated to the EKG, at a sampling rate of at least 256 Hz. Video was recorded throughout the study. The recording was interpreted using digital review software allowing for montage reformatting, gain and filter changes. Each page was reviewed manually. Automatic spike and seizure detection software and quantitative analysis tools were used as needed.   Description of EEG features: The recording almost continuously shows burst suppression with bursts of low amplitude theta-delta activity 3-4 seconds and interburst intervals of up to 10 seconds.   Within the bursts, occasionally, sharp waves are seen with a broad left hemispheric field.   Push Button Events: none  Interpretation: This EEG is indicative of an epileptic irritative zone in the left parietal region. The EEG shows a burst suppression pattern during which rare sharp waves are still present within the bursts. This is largely stable throughout the epoch.

## 2017-07-28 NOTE — Progress Notes (Signed)
Dr. Amada Jupiter gave verbal orders for Versed to be titrated down by , every 2 hours.

## 2017-07-28 NOTE — Progress Notes (Signed)
MD notified about patient CBG over 200s consecutive times two. Awaiting orders. RN will continue to monitor.

## 2017-07-28 NOTE — Progress Notes (Signed)
eLink Physician-Brief Progress Note Patient Name: Richard [name] DOB: [birth year] MRN: [id number]   Date of Service  07/28/2017  HPI/Events of Note  Sub-optimal glycemic control  eICU Interventions  Pt switched from sensitive to regular ICU glycemic control protocol.        [name] [name] 07/28/2017, 1:25 AM

## 2017-07-28 NOTE — Progress Notes (Signed)
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 He was recently admitted for seizures and was discharged on 4/18 with instructions to start Keppra.  Wife stated that patient has difficulty swallowing the medication and has not been taking it at home.   4/29 wife noted patient was aphasic and had jerking of RUE and proceeded to call EMS. He was in his usual state of health prior to this. In the ED, he was noted to be actively seizing and was loaded with Keppra and given fosphenytoin as well. He continued to seize and required intubation for airway protection and initiation of propofol gtt for burst-suppression.   PAST MEDICAL HISTORY :  He  has a past medical history of CAD (coronary artery disease), Diabetes mellitus without complication (HCC), Diastolic dysfunction, ETOH abuse, H/O ventricular fibrillation, Hypertensive heart disease, Intracranial hemorrhage (HCC), Left eye injury, ST elevation MI (STEMI) (HCC) (06/12/15), and Stroke (HCC).  PAST SURGICAL HISTORY: He  has a past surgical history that includes Coronary angioplasty and LEFT HEART CATH AND CORONARY ANGIOGRAPHY (N/A, 06/12/2015).  No Known Allergies  No current facility-administered medications on file prior to encounter.    Current Outpatient Medications on File Prior to Encounter  Medication Sig  . aspirin 325 MG tablet Take 325 mg by mouth every other day.  . carvedilol (COREG) 6.25 MG tablet Take 1 tablet (6.25 mg total) by  mouth 2 (two) times daily with a meal.  . clopidogrel (PLAVIX) 75 MG tablet Take 75 mg by mouth daily.  . insulin NPH Human (HUMULIN N,NOVOLIN N) 100 UNIT/ML injection Inject 0.15 mLs (15 Units total) into the skin 2 (two) times daily before a meal.  . lisinopril-hydrochlorothiazide (PRINZIDE,ZESTORETIC) 20-12.5 MG tablet Take 2 tablets by mouth daily.   . naproxen sodium (ALEVE) 220 MG tablet Take 440 mg by mouth 2 (two) times daily as needed (pain/headache).  Marland Kitchen amLODipine (NORVASC) 10 MG tablet Take 1 tablet (10 mg total) by mouth daily. (Patient not taking: Reported on 07/23/2017)  . atorvastatin (LIPITOR) 40 MG tablet Take 40 mg by mouth daily.   Marland Kitchen levETIRAcetam (KEPPRA) 750 MG tablet Take 1 tablet (750 mg total) by mouth 2 (two) times daily. (Patient not taking: Reported on 07/17/2017)  . Multiple Vitamin (MULTIVITAMIN WITH MINERALS) TABS tablet Take 1 tablet by mouth daily. (Patient not taking: Reported on 07/03/2017)  . nitroGLYCERIN (NITROSTAT) 0.4 MG SL tablet Place 1 tablet (0.4 mg total) under the tongue every 5 (five) minutes as needed for chest pain. (Patient not taking: Reported on 07/09/2017)    FAMILY HISTORY:  His indicated that his mother is deceased. He indicated that his father is deceased. He indicated that his brother is alive. He indicated that his maternal grandmother is deceased. He indicated that his maternal grandfather is deceased. He indicated that his paternal grandmother is deceased. He indicated that his paternal grandfather is deceased.   SOCIAL HISTORY: He  reports that he has never smoked. He has never used smokeless  tobacco. He reports that he does not drink alcohol or use drugs.  REVIEW OF SYSTEMS:   Unobtainable  SUBJECTIVE:  Has completed 2 days of burst suppression.  Current EEG shows less severe spike waves with bursts.  VITAL SIGNS: BP 105/61   Pulse 77   Temp 99.3 F (37.4 C)   Resp 13   Ht  (1.854 m)   Wt 206 lb 2.1 oz (93.5 kg)   SpO2 100%    BMI 27.20 kg/m   HEMODYNAMICS:  Remains on phenylephrine 16mcg/min  VENTILATOR SETTINGS: Vent Mode: PRVC FiO2 (%):  [30 %] 30 % Set Rate:  [11 bmp] 11 bmp Vt Set:  [640 mL] 640 mL PEEP:  [5 cmH20] 5 cmH20 Plateau Pressure:  [15 cmH20-18 cmH20] 16 cmH20  INTAKE / OUTPUT: I/O last 3 completed shifts: In: 4705.2 [I.V.:3042.2; Other:125; NG/GT:1058; IV Piggyback:480] Out: 1975 [Urine:1975]  PHYSICAL EXAMINATION: General: Intubated, mechanically ventilated, and in no distress Neuro: No response to voice or sternal rub.  The right pupil is reactive at 3 mm left lobe is posttraumatic Cardiovascular: S1 and S2 are regular without murmur rub or gallop he currently has no dependent edema Lungs: He is not breathing above the set ventilator rate, there is symmetric air movement, no wheezes Abdomen: The abdomen is flat and soft without any obvious masses or tenderness   LABS:  BMET Recent Labs  Lab 07/14/2017 0930 07/23/2017 0941 07/25/17 0229 07/26/17 0112  NA 137 136 138 139  K 3.7 4.0 3.4* 5.3*  CL 97* 97* 105 107  CO2 28  --  25 19*  BUN CREATININE 1.02 0.90 1.27* 1.34*  GLUCOSE 133* 126* 125* 111*    Electrolytes Recent Labs  Lab 07/11/2017 0930  07/25/17 0229 07/26/17 0112  07/27/17 0425 07/27/17 1712 07/28/17 0611  CALCIUM 9.6  --  8.8* 8.6*  --   --   --   --   MG  --    < > 1.8 1.9   < > 2.0 2.0 2.0  PHOS  --   --  2.6  --    < > 3.8 3.8 2.8   < > = values in this interval not displayed.    CBC Recent Labs  Lab 07/16/2017 0930 07/17/2017 0941 07/25/17 0229 07/26/17 0536  WBC 9.9  --  10.4 8.7  HGB 13.9 14.6 12.3* 12.0*  HCT 41.3 43.0 37.5* 36.8*  PLT 336  --  303 285    Coag's Recent Labs  Lab 07/13/2017 0930  APTT 30  INR 1.01    Sepsis Markers No results for input(s): LATICACIDVEN, PROCALCITON, O2SATVEN in the last 168 hours.  ABG Recent Labs  Lab 07/25/17 0349 07/25/17 1404 07/26/17 0245  PHART 7.524* 7.435 7.463*  PCO2ART  30.2* 34.3 29.1*  PO2ART 161* 182.0* 130*    Liver Enzymes Recent Labs  Lab 07/02/2017 0930 07/25/17 0229  AST 20 19  ALT 26 20  ALKPHOS 71 58  BILITOT 1.1 1.0  ALBUMIN 3.8 3.2*    Cardiac Enzymes No results for input(s): TROPONINI, PROBNP in the last 168 hours.  Glucose Recent Labs  Lab 07/28/17 0313 07/28/17 0420 07/28/17 0526 07/28/17 0621 07/28/17 0746 07/28/17 0858  GLUCAP 262* 256* 238* 251* 250* 240*    Imaging No results found.   STUDIES:  Chest x-ray to my eye shows a well-placed endotracheal tube and no infiltrate or CHF  ANTIBIOTICS: None   DISCUSSION:  This is a 72 year old with a prior history of intracranial hemorrhage coronary artery disease type 2 diabetes and a listed history of CHF who presented with seizures.  He had been unable to be compliant with his medicines at home.  Seizures did not respond to initial therapy and he is required sedation to burst suppression for control.  ASSESSMENT / PLAN:  PULMONARY A: He is intubated at this point solely for airway protection.  SBT once more awake.  CARDIOVASCULAR A: Hypotension and bradycardia have resolved since coming off propofol.  GASTROINTESTINAL A: GI prophylaxis with Protonix. Start feeds. At moderate nutritional risk.  HEMATOLOGIC A: DVT prophylaxis with Lovenox   RENAL A: mild positive fluid balance. Will hold on diuresis.  INFECTIOUS A: No active issues  ENDOCRINE A: Diabetes is being controlled with sliding scale insulin - still hyperglycemic. Will add Lantus and increase mealtime insulin.  NEUROLOGIC A: He has been sedated to burst suppression.  He continues baseline antiepileptics with Vimpat Keppra and Dilantin.   Slow wean of midazolam. Will add dose of phenobarbital to facilitate midazolam wean.  Patient remains critically ill due to status epilepticus requiring titration of IV sedatives and respiratory failure requiring mechanical ventilation.  Patient remains at  high risk for clinical decompensation.  Total critical care time excluding separately billable procedures: 35 minutes.  Lynnell Catalan, MD Critical Care Medicine [facility] Pager: [phone]   [phone]  07/28/2017, 9:58 AM

## 2017-07-28 NOTE — Progress Notes (Addendum)
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  He had a similar presentation a month prior, and was on Keppra but was unable to swallow the tablets and they had not been able to get a provider to call in the liquid.   He has had persistent difficulty with weaning due to frequent left-sided discharges.  I would favor adding something directly GABAergic will start on phenobarbital.  Recommendations: 1) continue Keppra 1500 twice daily 2) continue Vimpat 200 mg twice daily 3) continue Dilantin 100 mg 3 times daily 4) topiramate 200 mill grams twice daily 5) begin lightening sedation 6) phenobarbital 15 mg/kg load, check level tomorrow.  This patient is critically ill and at significant risk of neurological worsening, death and care requires constant monitoring of vital signs, hemodynamics,respiratory and cardiac monitoring, neurological assessment, discussion with family, other specialists and medical decision making of high complexity. I spent 40 minutes of neurocritical care time  in the care of  this patient.  Ritta Slot, MD [facility] [phone]  If 7pm- 7am, please page neurology on call as listed in AMION. 07/28/2017  7:47 PM

## 2017-07-28 NOTE — Progress Notes (Signed)
eLink Physician-Brief Progress Note Patient Name: Richard [name] DOB: April 16, 1945 MRN: [id number]   Date of Service  07/28/2017  HPI/Events of Note  Blood sugar remains elevated above 250 mg % on 3 AM check despite standard insulin regimen.  eICU Interventions  Begin phase 2 iv insulin protocol        Migdalia Dk 07/28/2017, 3:43 AM

## 2017-07-29 LAB — BASIC METABOLIC PANEL
Anion gap: 10 (ref 5–15)
BUN: 19 mg/dL (ref 6–20)
CALCIUM: 8.4 mg/dL — AB (ref 8.9–10.3)
CHLORIDE: 111 mmol/L (ref 101–111)
CO2: 22 mmol/L (ref 22–32)
CREATININE: 0.95 mg/dL (ref 0.61–1.24)
GFR calc Af Amer: >60 mL/min (ref >60)
Glucose, Bld: 187 mg/dL — ABNORMAL HIGH (ref 65–99)
POTASSIUM: 4.2 mmol/L (ref 3.5–5.1)
Sodium: 143 mmol/L (ref 135–145)

## 2017-07-29 LAB — PHENOBARBITAL LEVEL: Phenobarbital: 23 ug/mL (ref 15.0–30.0)

## 2017-07-29 LAB — GLUCOSE, CAPILLARY
Glucose-Capillary: 235 mg/dL — ABNORMAL HIGH (ref 65–99)
Glucose-Capillary: 207 mg/dL — ABNORMAL HIGH (ref 65–99)
Glucose-Capillary: 250 mg/dL — ABNORMAL HIGH (ref 65–99)
Glucose-Capillary: 236 mg/dL — ABNORMAL HIGH (ref 65–99)
Glucose-Capillary: 273 mg/dL — ABNORMAL HIGH (ref 65–99)

## 2017-07-29 LAB — PHENYTOIN LEVEL, TOTAL: Phenytoin Lvl: 15.8 ug/mL (ref 10.0–20.0)

## 2017-07-29 MED ORDER — INSULIN GLARGINE 100 UNIT/ML ~~LOC~~ SOLN
8.0000 [IU] | Freq: Two times a day (BID) | SUBCUTANEOUS | Status: DC
  Administered 2017-07-29 – 2017-07-30 (×2): 8 [IU] via SUBCUTANEOUS
  Filled 2017-07-29 (×3): qty 0.08

## 2017-07-29 MED ORDER — PHENOBARBITAL 20 MG/5ML PO ELIX
60.0000 mg | ORAL_SOLUTION | Freq: Every day | ORAL | Status: DC
  Administered 2017-07-29 – 2017-08-01 (×4): 60 mg via ORAL
  Filled 2017-07-29 (×4): qty 15

## 2017-07-29 MED ORDER — SODIUM CHLORIDE 0.9 % IV SOLN
0.5000 mg/h | INTRAVENOUS | Status: DC
  Administered 2017-07-29: 4 mg/h via INTRAVENOUS
  Filled 2017-07-29: qty 10

## 2017-07-29 MED ORDER — INSULIN ASPART 100 UNIT/ML ~~LOC~~ SOLN
0.0000 [IU] | SUBCUTANEOUS | Status: DC
  Administered 2017-07-29 (×2): 7 [IU] via SUBCUTANEOUS
  Administered 2017-07-29: 11 [IU] via SUBCUTANEOUS
  Administered 2017-07-30 (×2): 7 [IU] via SUBCUTANEOUS
  Administered 2017-07-30: 4 [IU] via SUBCUTANEOUS
  Administered 2017-07-30: 11 [IU] via SUBCUTANEOUS
  Administered 2017-07-30: 4 [IU] via SUBCUTANEOUS
  Administered 2017-07-30 – 2017-07-31 (×2): 7 [IU] via SUBCUTANEOUS
  Administered 2017-07-31 (×2): 4 [IU] via SUBCUTANEOUS
  Administered 2017-07-31 (×2): 7 [IU] via SUBCUTANEOUS
  Administered 2017-07-31: 4 [IU] via SUBCUTANEOUS
  Administered 2017-08-01: 7 [IU] via SUBCUTANEOUS
  Administered 2017-08-01: 4 [IU] via SUBCUTANEOUS
  Administered 2017-08-01: 3 [IU] via SUBCUTANEOUS
  Administered 2017-08-01: 7 [IU] via SUBCUTANEOUS

## 2017-07-29 NOTE — Progress Notes (Signed)
CCMD notified that patient's last two CBG this morning was 207 and 235. CCMD stated he will review patient meds. RN will continue to monitor.

## 2017-07-29 NOTE — Progress Notes (Signed)
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 He was recently admitted for seizures and was discharged on 4/18 with instructions to start Keppra.  Wife stated that patient has difficulty swallowing the medication and has not been taking it at home.   4/29 wife noted patient was aphasic and had jerking of RUE and proceeded to call EMS. He was in his usual state of health prior to this. In the ED, he was noted to be actively seizing and was loaded with Keppra and given fosphenytoin as well. He continued to seize and required intubation for airway protection and initiation of propofol gtt for burst-suppression.   Patient had been having breakthrough seizures on propofol.  Patient was started on Versed and phenobarbital  PAST MEDICAL HISTORY :  He  has a past medical history of CAD (coronary artery disease), Diabetes mellitus without complication (HCC), Diastolic dysfunction, ETOH abuse, H/O ventricular fibrillation, Hypertensive heart disease, Intracranial hemorrhage (HCC), Left eye injury, ST elevation MI (STEMI) (HCC) (06/12/15), and Stroke (HCC).  PAST SURGICAL HISTORY: He  has a past surgical history that includes Coronary angioplasty and LEFT HEART CATH AND CORONARY ANGIOGRAPHY (N/A, 06/12/2015).  No Known Allergies  No current facility-administered medications on file prior to encounter.    Current Outpatient Medications on File Prior to Encounter  Medication Sig  . aspirin 325 MG tablet  Take 325 mg by mouth every other day.  . carvedilol (COREG) 6.25 MG tablet Take 1 tablet (6.25 mg total) by mouth 2 (two) times daily with a meal.  . clopidogrel (PLAVIX) 75 MG tablet Take 75 mg by mouth daily.  . insulin NPH Human (HUMULIN N,NOVOLIN N) 100 UNIT/ML injection Inject 0.15 mLs (15 Units total) into the skin 2 (two) times daily before a meal.  . lisinopril-hydrochlorothiazide (PRINZIDE,ZESTORETIC) 20-12.5 MG tablet Take 2 tablets by mouth daily.   . naproxen sodium (ALEVE) 220 MG tablet Take 440 mg by mouth 2 (two) times daily as needed (pain/headache).  Marland Kitchen amLODipine (NORVASC) 10 MG tablet Take 1 tablet (10 mg total) by mouth daily. (Patient not taking: Reported on 07/20/2017)  . atorvastatin (LIPITOR) 40 MG tablet Take 40 mg by mouth daily.   Marland Kitchen levETIRAcetam (KEPPRA) 750 MG tablet Take 1 tablet (750 mg total) by mouth 2 (two) times daily. (Patient not taking: Reported on 07/18/2017)  . Multiple Vitamin (MULTIVITAMIN WITH MINERALS) TABS tablet Take 1 tablet by mouth daily. (Patient not taking: Reported on 07/03/2017)  . nitroGLYCERIN (NITROSTAT) 0.4 MG SL tablet Place 1 tablet (0.4 mg total) under the tongue every 5 (five) minutes as needed for chest pain. (Patient not taking: Reported on 06/26/2017)    FAMILY HISTORY:  His indicated that his mother is deceased. He indicated that his father is deceased. He indicated that his brother is alive. He indicated that his maternal grandmother is deceased. He indicated that his maternal grandfather is deceased. He indicated that his paternal grandmother is deceased. He indicated that his paternal grandfather is deceased.  SOCIAL HISTORY: He  reports that he has never smoked. He has never used smokeless tobacco. He reports that he does not drink alcohol or use drugs.  REVIEW OF SYSTEMS:   Unobtainable  SUBJECTIVE:  Has completed 2 days of burst suppression.  Current EEG shows less severe spike waves with bursts.  VITAL SIGNS: BP 139/75    Pulse 87   Temp 100.2 F (37.9 C)   Resp 15   Ht  (1.854 m)   Wt 208 lb 15.9 oz (94.8 kg)   SpO2 100%   BMI 27.57 kg/m   HEMODYNAMICS:  Remains on phenylephrine 67mcg/min  VENTILATOR SETTINGS: Vent Mode: PRVC FiO2 (%):  [30 %] 30 % Set Rate:  [11 bmp] 11 bmp Vt Set:  [640 mL] 640 mL PEEP:  [5 cmH20] 5 cmH20 Plateau Pressure:  [16 cmH20-18 cmH20] 16 cmH20  INTAKE / OUTPUT: I/O last 3 completed shifts: In: 3173 [I.V.:758; NG/GT:1980; IV Piggyback:435] Out: 1170 [Urine:1170]  PHYSICAL EXAMINATION: General: Intubated, mechanically ventilated, and in no distress Neuro: No response to voice or sternal rub.  The right pupil is reactive at 3 mm left lobe is posttraumatic Cardiovascular: S1 and S2 are regular without murmur rub or gallop he currently has no dependent edema Lungs: He is not breathing above the set ventilator rate, there is symmetric air movement, no wheezes Abdomen: The abdomen is flat and soft without any obvious masses or tenderness   LABS:  BMET Recent Labs  Lab 07/25/17 0229 07/26/17 0112 07/29/17 0322  NA 138 139 143  K 3.4* 5.3* 4.2  CL 105 107 111  CO2 25 19* 22  BUN CREATININE 1.27* 1.34* 0.95  GLUCOSE 125* 111* 187*    Electrolytes Recent Labs  Lab 07/25/17 0229 07/26/17 0112  07/27/17 0425 07/27/17 1712 07/28/17 0611 07/29/17 0322  CALCIUM 8.8* 8.6*  --   --   --   --  8.4*  MG 1.8 1.9   < > 2.0 2.0 2.0  --   PHOS 2.6  --    < > 3.8 3.8 2.8  --    < > = values in this interval not displayed.    CBC Recent Labs  Lab 07/10/2017 0930 06/28/2017 0941 07/25/17 0229 07/26/17 0536  WBC 9.9  --  10.4 8.7  HGB 13.9 14.6 12.3* 12.0*  HCT 41.3 43.0 37.5* 36.8*  PLT 336  --  303 285    Coag's Recent Labs  Lab 07/06/2017 0930  APTT 30  INR 1.01    Sepsis Markers No results for input(s): LATICACIDVEN, PROCALCITON, O2SATVEN in the last 168 hours.  ABG Recent Labs  Lab 07/25/17 0349 07/25/17 1404 07/26/17 0245   PHART 7.524* 7.435 7.463*  PCO2ART 30.2* 34.3 29.1*  PO2ART 161* 182.0* 130*    Liver Enzymes Recent Labs  Lab 07/23/2017 0930 07/25/17 0229  AST 20 19  ALT 26 20  ALKPHOS 71 58  BILITOT 1.1 1.0  ALBUMIN 3.8 3.2*    Cardiac Enzymes No results for input(s): TROPONINI, PROBNP in the last 168 hours.  Glucose Recent Labs  Lab 07/28/17 2204 07/28/17 2313 07/29/17 0515 07/29/17 0727 07/29/17 1118 07/29/17 1536  GLUCAP 148* 142* 235* 207* 250* 236*    Imaging No results found.   STUDIES:  Chest x-ray to my eye shows a well-placed endotracheal tube and no infiltrate or CHF  ANTIBIOTICS: None   DISCUSSION:      This is a 72 year old with a prior history  of intracranial hemorrhage coronary artery disease type 2 diabetes and a listed history of CHF who presented with seizures.  He had been unable to be compliant with his medicines at home.  Seizures did not respond to initial therapy and he is required sedation to burst suppression for control.  ASSESSMENT / PLAN:  PULMONARY A: Ventilatory dependent respiratory failure secondary to status epilepticus. P: Continue with current vent settings VAP precautions Saturating well No SBT at this time  CARDIOVASCULAR A:  History of coronary disease History of STEMI status post PCI History of heart failure with preserved EF  P: Patient currently hemodynamic is stable. Not requiring pressors at this time.  Bradycardia has resolved.  GASTROINTESTINAL A: No acute issues at this time GI prophylaxis with Protonix. Start feeds. At moderate nutritional risk.  HEMATOLOGIC A: No acute issues at this time  DVT prophylaxis with Lovenox   RENAL A: No acute issues at this time Monitor ins and outs Place electrolytes as needed  INFECTIOUS A: No active issues  ENDOCRINE A: Diabetes  P: Continue with Lantus and insulin sliding scale.  Sugars are relatively well controlled at this time.  NEUROLOGIC A: Status epilepticus  with history of noncompliance to medications P: Neurology following  Continue with Vimpat Keppra Dilantin.   Patient currently on Versed drip phenobarbital has been added   Patient remains critically ill due to status epilepticus requiring titration of IV sedatives and respiratory failure requiring mechanical ventilation.  Patient remains at high risk for clinical decompensation.  Total critical care time excluding separately billable procedures: 35 minutes.  Dimas [facility] Pulmonary Critical Care Pager: [phone]   07/29/2017, 7:02 PM

## 2017-07-29 NOTE — Progress Notes (Signed)
Per Neurology, continue to titrate versed drip down by one every two hours until drip is turned off. RN will continue to monitor.

## 2017-07-29 NOTE — Progress Notes (Signed)
MD notified of patient displaying "twitching" in left eye. MD stated to continue to monitor patient and continue with same treatment. RN will continue to monitor.

## 2017-07-29 NOTE — Progress Notes (Signed)
LTM EEG checked, no skin breakdown noted. Repasted T3, A1, A2, Cz, Pz, T3 and T5

## 2017-07-29 NOTE — Procedures (Signed)
  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    TYPE OF RECORDING: At least 18-channel digital EEG with using a standard international 10-20 placement with additional EEG electrodes, and 1 additional channel dedicated to the EKG, at a sampling rate of at least 256 Hz. Video was recorded throughout the study. The recording was interpreted using digital review software allowing for montage reformatting, gain and filter changes. Each page was reviewed manually. Automatic spike and seizure detection software and quantitative analysis tools were used as needed.   Description of EEG features: Continuous background activities delta slowing   with more prominent slowing across left hemisphere. superimposed left post temporal l poorly formed sharp waves were present, No seizures.  One PB event around 22:17 was arousal and not seizures.   Interpretation: This EEG is indicative of encephalopathy of non specific etiologies. Left hemispheric neuronal dysfunctions and cortical irritability in the posterior temporal region. No seizures.

## 2017-07-29 NOTE — Progress Notes (Signed)
Subjective: Weaning sedation overnight, now on /hr of versed.   Exam: Vitals:   07/29/17 0732 07/29/17 0800  BP: 137/75 109/65  Pulse: 82 81  Resp: 13 13  Temp: 99.9 F (37.7 C) 99.9 F (37.7 C)  SpO2: 99% 100%   Gen: In bed, intubated Resp: non-labored breathing, no acute distress Abd: soft, nt  Neuro: MS: unresponsive, does grmiace slightly to nox sitm.  CN: Right pupil reactive, left enucleation.  Motor: minimal withdrawal bilaterally. Sensory: as above.   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  He had a similar presentation a month prior, and was on Keppra but was unable to swallow the tablets and they had not been able to get a provider to call in the liquid.   He has had persistent difficulty with weaning due to frequent left-sided discharges.  I would favor adding something directly GABAergic will start on phenobarbital.  Recommendations: 1) continue Keppra 1500 twice daily 2) continue Vimpat 200 mg twice daily 3) continue Dilantin 100 mg 3 times daily 4) topiramate 200 mill grams twice daily 5) begin lightening sedation 6) Start phenobarbital  daily.   This patient is critically ill and at significant risk of neurological worsening, death and care requires constant monitoring of vital signs, hemodynamics,respiratory and cardiac monitoring, neurological assessment, discussion with family, other specialists and medical decision making of high complexity. I spent 40 minutes of neurocritical care time  in the care of  this patient.  Ritta Slot, MD [facility] [phone]  If 7pm- 7am, please page neurology on call as listed in AMION. 07/29/2017  8:41 AM

## 2017-07-30 LAB — GLUCOSE, CAPILLARY
GLUCOSE-CAPILLARY: 177 mg/dL — AB (ref 65–99)
GLUCOSE-CAPILLARY: 194 mg/dL — AB (ref 65–99)
GLUCOSE-CAPILLARY: 223 mg/dL — AB (ref 65–99)
GLUCOSE-CAPILLARY: 224 mg/dL — AB (ref 65–99)
Glucose-Capillary: 166 mg/dL — ABNORMAL HIGH (ref 65–99)
Glucose-Capillary: 266 mg/dL — ABNORMAL HIGH (ref 65–99)
Glucose-Capillary: 221 mg/dL — ABNORMAL HIGH (ref 65–99)

## 2017-07-30 MED ORDER — INSULIN GLARGINE 100 UNIT/ML ~~LOC~~ SOLN
10.0000 [IU] | Freq: Two times a day (BID) | SUBCUTANEOUS | Status: DC
  Administered 2017-07-30 – 2017-08-01 (×4): 10 [IU] via SUBCUTANEOUS
  Filled 2017-07-30 (×5): qty 0.1

## 2017-07-30 NOTE — Progress Notes (Signed)
Subjective: Weaning sedation overnight, now on /hr of versed.   Exam: Vitals:   07/30/17 1600 07/30/17 1700  BP: 137/72 101/64  Pulse: 81 77  Resp: 16 16  Temp: 99.3 F (37.4 C) 99.3 F (37.4 C)  SpO2: 100% 100%   Gen: In bed, intubated Resp: non-labored breathing, no acute distress Abd: soft, nt  Neuro: MS: unresponsive, does grmiace slightly to nox sitm.  CN: Right pupil reactive, left enucleation.  Motor: minimal withdrawal bilaterally. Sensory: as above.   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  He had a similar presentation a month prior, and was on Keppra but was unable to swallow the tablets and they had not been able to get a provider to call in the liquid.   He has been off sedation since 5/4.  He has had return of a PLEDs pattern following weaning, but no clear seizures.  Given the partial nature, I would not aggressively pursue this pattern unless he were to have evidence of seizure activity.  Recommendations: 1) continue Keppra 1500 twice daily 2) continue Vimpat 200 mg twice daily 3) continue Dilantin 100 mg 3 times daily 4) topiramate 200 mill grams twice daily 5) continue phenobarbital  daily.   This patient is critically ill and at significant risk of neurological worsening, death and care requires constant monitoring of vital signs, hemodynamics,respiratory and cardiac monitoring, neurological assessment, discussion with family, other specialists and medical decision making of high complexity. I spent 40 minutes of neurocritical care time  in the care of  this patient.  Ritta Slot, MD [facility] [phone]  If 7pm- 7am, please page neurology on call as listed in AMION. 07/30/2017  6:03 PM

## 2017-07-30 NOTE — Progress Notes (Signed)
Wasted 60 mL of versed in the sink from the previous versed drip with Gwynn Burly RN as a witness.

## 2017-07-30 NOTE — Procedures (Signed)
  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    TYPE OF RECORDING: At least 18-channel digital EEG with using a standard international 10-20 placement with additional EEG electrodes, and 1 additional channel dedicated to the EKG, at a sampling rate of at least 256 Hz. Video was recorded throughout the study. The recording was interpreted using digital review software allowing for montage reformatting, gain and filter changes. Each page was reviewed manually. Automatic spike and seizure detection software and quantitative analysis tools were used as needed.   Day 5:  Continuous background activities delta slowing  with more prominent slowing across left hemisphere. superimposed left post temporal  poorly formed sharp waves were present, No seizures.  One PB event around 22:17 was arousal and not seizures.  Day 6: Continuous background activities delta slowing  with more prominent slowing across left hemisphere. superimposed left post temporal  poorly formed sharp waves were present, No seizures.    Interpretation: This EEG is indicative of encephalopathy of nonspecific etiologies. Left hemispheric neuronal dysfunctions and cortical irritability in the posterior temporal region. No seizures.

## 2017-07-30 NOTE — Progress Notes (Signed)
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 He was recently admitted for seizures and was discharged on 4/18 with instructions to start Keppra.  Wife stated that patient has difficulty swallowing the medication and has not been taking it at home.   4/29 wife noted patient was aphasic and had jerking of RUE and proceeded to call EMS. He was in his usual state of health prior to this. In the ED, he was noted to be actively seizing and was loaded with Keppra and given fosphenytoin as well. He continued to seize and required intubation for airway protection and initiation of propofol gtt for burst-suppression.   Patient had been having breakthrough seizures on propofol.  Patient was started on Versed and phenobarbital  Patient has been off Versed drip since 2 PM on 07/29/2017  PAST MEDICAL HISTORY :  He  has a past medical history of CAD (coronary artery disease), Diabetes mellitus without complication (HCC), Diastolic dysfunction, ETOH abuse, H/O ventricular fibrillation, Hypertensive heart disease, Intracranial hemorrhage (HCC), Left eye injury, ST elevation MI (STEMI) (HCC) (06/12/15), and Stroke (HCC).  PAST SURGICAL HISTORY: He  has a past surgical history that includes Coronary angioplasty and LEFT HEART CATH AND CORONARY ANGIOGRAPHY (N/A, 06/12/2015).  No Known Allergies  No current facility-administered medications on file prior to encounter.    Current Outpatient Medications on File Prior  to Encounter  Medication Sig  . aspirin 325 MG tablet Take 325 mg by mouth every other day.  . carvedilol (COREG) 6.25 MG tablet Take 1 tablet (6.25 mg total) by mouth 2 (two) times daily with a meal.  . clopidogrel (PLAVIX) 75 MG tablet Take 75 mg by mouth daily.  . insulin NPH Human (HUMULIN N,NOVOLIN N) 100 UNIT/ML injection Inject 0.15 mLs (15 Units total) into the skin 2 (two) times daily before a meal.  . lisinopril-hydrochlorothiazide (PRINZIDE,ZESTORETIC) 20-12.5 MG tablet Take 2 tablets by mouth daily.   . naproxen sodium (ALEVE) 220 MG tablet Take 440 mg by mouth 2 (two) times daily as needed (pain/headache).  Marland Kitchen amLODipine (NORVASC) 10 MG tablet Take 1 tablet (10 mg total) by mouth daily. (Patient not taking: Reported on 08-18-17)  . atorvastatin (LIPITOR) 40 MG tablet Take 40 mg by mouth daily.   Marland Kitchen levETIRAcetam (KEPPRA) 750 MG tablet Take 1 tablet (750 mg total) by mouth 2 (two) times daily. (Patient not taking: Reported on 08/18/2017)  . Multiple Vitamin (MULTIVITAMIN WITH MINERALS) TABS tablet Take 1 tablet by mouth daily. (Patient not taking: Reported on 07/03/2017)  . nitroGLYCERIN (NITROSTAT) 0.4 MG SL tablet Place 1 tablet (0.4 mg total) under the tongue every 5 (five) minutes as needed for chest pain. (Patient not taking: Reported on 18-Aug-2017)    FAMILY HISTORY:  His indicated that his mother is deceased. He indicated that his father is deceased. He indicated that his brother is alive. He indicated that his maternal grandmother is deceased. He indicated that his maternal grandfather is deceased. He indicated that his  paternal grandmother is deceased. He indicated that his paternal grandfather is deceased.   SOCIAL HISTORY: He  reports that he has never smoked. He has never used smokeless tobacco. He reports that he does not drink alcohol or use drugs.  REVIEW OF SYSTEMS:   Unobtainable   VITAL SIGNS: BP 101/64   Pulse 77   Temp 99.3 F (37.4 C)   Resp 16   Ht   (1.854 m)   Wt 205 lb 7.5 oz (93.2 kg)   SpO2 100%   BMI 27.11 kg/m   HEMODYNAMICS:  VENTILATOR SETTINGS: Vent Mode: PSV;CPAP FiO2 (%):  [30 %] 30 % Set Rate:  [11 bmp] 11 bmp Vt Set:  [640 mL] 640 mL PEEP:  [5 cmH20] 5 cmH20 Pressure Support:  [8 cmH20] 8 cmH20 Plateau Pressure:  [13 cmH20-18 cmH20] 13 cmH20  INTAKE / OUTPUT: I/O last 3 completed shifts: In: 2964.2 [I.V.:224.2; NG/GT:2160; IV Piggyback:580] Out: 1455 [Urine:1455]  PHYSICAL EXAMINATION: General: Intubated, mechanically ventilated, and in no distress Neuro: No response to voice or sternal rub.  The right pupil is reactive at 3 mm left lobe is posttraumatic Cardiovascular: S1 and S2 are regular without murmur rub or gallop he currently has no dependent edema Lungs: Patient is on pressure support and tolerating it very well is saturating well very well good air entry bilaterally Abdomen: The abdomen is flat and soft without any obvious masses or tenderness   LABS:  BMET Recent Labs  Lab 07/25/17 0229 07/26/17 0112 07/29/17 0322  NA 138 139 143  K 3.4* 5.3* 4.2  CL 105 107 111  CO2 25 19* 22  BUN CREATININE 1.27* 1.34* 0.95  GLUCOSE 125* 111* 187*    Electrolytes Recent Labs  Lab 07/25/17 0229 07/26/17 0112  07/27/17 0425 07/27/17 1712 07/28/17 0611 07/29/17 0322  CALCIUM 8.8* 8.6*  --   --   --   --  8.4*  MG 1.8 1.9   < > 2.0 2.0 2.0  --   PHOS 2.6  --    < > 3.8 3.8 2.8  --    < > = values in this interval not displayed.    CBC Recent Labs  Lab 06/30/2017 0930 07/13/2017 0941 07/25/17 0229 07/26/17 0536  WBC 9.9  --  10.4 8.7  HGB 13.9 14.6 12.3* 12.0*  HCT 41.3 43.0 37.5* 36.8*  PLT 336  --  303 285    Coag's Recent Labs  Lab 07/08/2017 0930  APTT 30  INR 1.01    Sepsis Markers No results for input(s): LATICACIDVEN, PROCALCITON, O2SATVEN in the last 168 hours.  ABG Recent Labs  Lab 07/25/17 0349 07/25/17 1404 07/26/17 0245  PHART 7.524* 7.435 7.463*   PCO2ART 30.2* 34.3 29.1*  PO2ART 161* 182.0* 130*    Liver Enzymes Recent Labs  Lab 07/05/2017 0930 07/25/17 0229  AST 20 19  ALT 26 20  ALKPHOS 71 58  BILITOT 1.1 1.0  ALBUMIN 3.8 3.2*    Cardiac Enzymes No results for input(s): TROPONINI, PROBNP in the last 168 hours.  Glucose Recent Labs  Lab 07/29/17 1925 07/30/17 0006 07/30/17 0334 07/30/17 0738 07/30/17 1204 07/30/17 1709  GLUCAP 273* 223* 194* 166* 266* 221*    Imaging No results found.   STUDIES:  Chest x-ray to my eye shows a well-placed endotracheal tube and no infiltrate or CHF  ANTIBIOTICS: None   DISCUSSION:      This is a 72 year old with a prior history  of intracranial hemorrhage coronary artery disease type 2 diabetes and a listed history of CHF who presented with seizures.  He had been unable to be compliant with his medicines at home.  Seizures did not respond to initial therapy and he is required sedation to burst suppression for control.  ASSESSMENT / PLAN:  PULMONARY A: Ventilatory dependent respiratory failure secondary to status epilepticus. P: Put on pressure support and is tolerating it very well.  Saturating well.  Unable to extubate at this time due to neuro exam mental status. Daily SBT VAP precautions  CARDIOVASCULAR A:  History of coronary disease History of STEMI status post PCI History of heart failure with preserved EF  P: Patient currently hemodynamic is stable. Not requiring pressors at this time.  Bradycardia has resolved.  GASTROINTESTINAL A: No acute issues at this time GI prophylaxis with Protonix.   At moderate nutritional risk.  Continue tube feeds  HEMATOLOGIC A: No acute issues at this time  DVT prophylaxis with Lovenox   RENAL A: No acute issues at this time Monitor ins and outs Place electrolytes as needed  INFECTIOUS A: No active issues  ENDOCRINE A: Diabetes  P: Continue with Lantus and insulin sliding scale.  Sugars are relatively well  controlled at this time.  NEUROLOGIC A: Status epilepticus with history of noncompliance to medications P: Neurology following  Continue with Vimpat Keppra Dilantin.   Patient currently off Versed for more than 48 hours.  Poor neuro exam.  Will discuss with neurology.  Patient remains critically ill due to status epilepticus requiring titration of IV sedatives and respiratory failure requiring mechanical ventilation.  Patient remains at high risk for clinical decompensation.  Total critical care time excluding separately billable procedures: 35 minutes.  [facility] Pulmonary Critical Care Pager: [phone]   07/30/2017, 6:04 PM

## 2017-07-30 NOTE — Progress Notes (Signed)
LTM EEG check, paste added to C3, C4, T3, T5. No skin breakdown noted

## 2017-07-31 ENCOUNTER — Inpatient Hospital Stay (HOSPITAL_COMMUNITY): Payer: PPO

## 2017-07-31 DIAGNOSIS — J9601 Acute respiratory failure with hypoxia: Secondary | ICD-10-CM

## 2017-07-31 LAB — GLUCOSE, CAPILLARY
GLUCOSE-CAPILLARY: 124 mg/dL — AB (ref 65–99)
GLUCOSE-CAPILLARY: 161 mg/dL — AB (ref 65–99)
GLUCOSE-CAPILLARY: 210 mg/dL — AB (ref 65–99)
GLUCOSE-CAPILLARY: 215 mg/dL — AB (ref 65–99)
Glucose-Capillary: 198 mg/dL — ABNORMAL HIGH (ref 65–99)
Glucose-Capillary: 208 mg/dL — ABNORMAL HIGH (ref 65–99)

## 2017-07-31 LAB — BASIC METABOLIC PANEL
Anion gap: 9 (ref 5–15)
BUN: 29 mg/dL — ABNORMAL HIGH (ref 6–20)
CO2: 22 mmol/L (ref 22–32)
Calcium: 8.7 mg/dL — ABNORMAL LOW (ref 8.9–10.3)
Chloride: 112 mmol/L — ABNORMAL HIGH (ref 101–111)
Creatinine, Ser: 0.85 mg/dL (ref 0.61–1.24)
GFR calc Af Amer: >60 mL/min (ref >60)
GFR calc non Af Amer: >60 mL/min (ref >60)
Glucose, Bld: 173 mg/dL — ABNORMAL HIGH (ref 65–99)
Potassium: 4.9 mmol/L (ref 3.5–5.1)
Sodium: 143 mmol/L (ref 135–145)

## 2017-07-31 LAB — CBC
HCT: 43.2 % (ref 39.0–52.0)
Hemoglobin: 13.6 g/dL (ref 13.0–17.0)
MCH: 29.8 pg (ref 26.0–34.0)
MCHC: 31.5 g/dL (ref 30.0–36.0)
MCV: 94.7 fL (ref 78.0–100.0)
Platelets: 153 10*3/uL (ref 150–400)
RBC: 4.56 MIL/uL (ref 4.22–5.81)
RDW: 12.9 % (ref 11.5–15.5)
WBC: 6.5 10*3/uL (ref 4.0–10.5)

## 2017-07-31 LAB — MAGNESIUM: Magnesium: 2.2 mg/dL (ref 1.7–2.4)

## 2017-07-31 NOTE — Procedures (Signed)
  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    TYPE OF RECORDING: At least 18-channel digital EEG with using a standard international 10-20 placement with additional EEG electrodes, and 1 additional channel dedicated to the EKG, at a sampling rate of at least 256 Hz. Video was recorded throughout the study. The recording was interpreted using digital review software allowing for montage reformatting, gain and filter changes. Each page was reviewed manually. Automatic spike and seizure detection software and quantitative analysis tools were used as needed.   Day 5:  Continuous background activities delta slowing  with more prominent slowing across left hemisphere. superimposed left post temporal  poorly formed sharp waves were present, No seizures.  One PB event around 22:17 was arousal and not seizures.  Day 6: Continuous background activities delta slowing  with more prominent slowing across left hemisphere. superimposed left post temporal  poorly formed sharp waves were present, No seizures.   Day 7: Background activities slowly  in delta and theta range.  More prominent attenuation across right hemisphere.  Across left hemisphere left lateralized epileptiform discharges at times and periodic fashion however poorly formed blunted and attenuated. No clinical or subclinical seizures present.  Interpretation: This EEG is indicative of encephalopathy of nonspecific etiologies.  Left lateralizing poorly formed,  irregular and attenuated  epileptiform discharges present throughout the recording.  No clinical subclinical seizures.  This finding suggestive of encephalopathy and  cortical irritability across the left hemisphere.  Clinical correlation is advised.

## 2017-07-31 NOTE — Progress Notes (Addendum)
Subjective: Weaning sedation overnight, no longer on Versed or Fentanyl, breathing over the vent.   Exam: Vitals:   07/31/17 0800 07/31/17 0818  BP: (!) 147/79 137/72  Pulse: 87 90  Resp: 14 13  Temp: 100.2 F (37.9 C) 100 F (37.8 C)  SpO2: 100% 100%    Neuro: MS: unresponsive,no grimace to nox sitm.  CN: Right pupil reactive, left enucleation.  Motor: no withdrawal bilaterally. Sensory: as above.   Pertinent Labs: Creatinine 0.85  LTM EEG day 7:  Background activities slowly  in delta and theta range.  More prominent attenuation across right hemisphere.  Across left hemisphere left lateralized epileptiform discharges at times and periodic fashion however poorly formed blunted and attenuated. No clinical or subclinical seizures present. Interpretation: This EEG is indicative of encephalopathy of nonspecific etiologies.  Left lateralizing poorly formed,  irregular and attenuated epileptiform discharges present throughout the recording.  No clinical subclinical seizures.  This finding suggestive of encephalopathy and cortical irritability across the left hemisphere.    Assessment: 72 year old male with complex partial status epilepticus likely secondary to seizure onset zone due to previous intracranial hemorrhage.  He had a similar presentation a month prior, and was on Keppra but was unable to swallow the tablets and they had not been able to get a provider to call in the liquid. He has been off sedation since 5/4.  He had return of a PLEDs pattern following weaning, but no clear seizures.  Given the partial nature, would not aggressively pursue this pattern unless he were to have evidence of seizure activity. LTM shows intermittent lateralized epileptiform discharges and PLEDs within the left hemisphere, but no electrographic seizures.   Recommendations: 1) continue Keppra 1500 mg twice daily 2) continue Vimpat 200 mg twice daily 3) continue Dilantin 100 mg 3 times daily 4)  topiramate 200 mg twice daily 5) continue phenobarbital 60 mg daily.  6) Discontinuing LTM EEG 7) Continue to monitor clinically  Felicie Morn PA-C [facility] [phone] 07/31/2017, 10:22 AM  Addendum: Possible trace acute parafalcine subdural hematoma was seen on CT head from 4/29. It may have been secondary to motion artifact but hemorrhage cannot be excluded without follow up MRI.   Additional recommendations: 1. Continue to hold ASA and Plavix for now. 2. Obtain MRI brain to confirm or rule out parafalcine subdural hematoma  Electronically signed: Dr. Caryl Pina

## 2017-07-31 NOTE — Progress Notes (Signed)
Chart reviewed and patient assessment completed. No family at bedside. Meeting with son, Domingo Cocking, 5/7 at 2pm. Thank you.  NO CHARGE  Vennie Homans, FNP-C Palliative Medicine Team  Phone: [phone] Fax: [phone]

## 2017-07-31 NOTE — Progress Notes (Signed)
Inpatient Diabetes Program Recommendations  AACE/ADA: New Consensus Statement on Inpatient Glycemic Control (2015)  Target Ranges:  Prepandial:   less than 140 mg/dL      Peak postprandial:   less than 180 mg/dL (1-2 hours)      Critically ill patients:  140 - 180 mg/dL   Results for KARRON, ALVIZO (MRN [id number]) as of 07/31/2017 10:54  Ref. Range 07/30/2017 07:38 07/30/2017 12:04 07/30/2017 17:09 07/30/2017 19:16 07/30/2017 23:49 07/31/2017 03:28 07/31/2017 08:39  Glucose-Capillary Latest Ref Range: 65 - 99 mg/dL 409 (H) 811 (H) 914 (H) 224 (H) 177 (H) 161 (H) 210 (H)   Review of Glycemic Control  Diabetes history: DM 2 Outpatient Diabetes medications: NPH 15 units bid Current orders for Inpatient glycemic control: Lantus 10 units BID, Novolog Resistant Correction 0-20 units Q4hours  Inpatient Diabetes Program Recommendations:    Glucose above ICU goal. Patient on Tube Feeds. Consider Novolog 4 units Q4 hours tube feed coverage (do not give if tube feeds are stopped or held for any reason)  Thanks,  Christena Deem RN, MSN, BC-ADM, [facility] Inpatient Diabetes Coordinator Team Pager [phone] (8a-5p)

## 2017-07-31 NOTE — Progress Notes (Signed)
LTM EEG checked, no skin breakdown noted. Pate added to multiple electrodes

## 2017-07-31 NOTE — Progress Notes (Signed)
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 He was recently admitted for seizures and was discharged on 4/18 with instructions to start [place]. Wife stated that patient has difficulty swallowing the medication and has not been taking it at home.  4/29 wife noted patient was aphasic and had jerking of RUE and proceeded to call EMS. He was in his usual state of health prior to this. In the ED, he was noted to be actively seizing and was loaded with Keppra and given fosphenytoin as well. He continued to seize and required intubation for airway protection and initiation of propofol gtt for burst-suppression.  Patient had been having breakthrough seizures on propofol.  Patient was started on Versed and phenobarbital Patient has been off Versed drip since 2 PM on 07/29/2017  PAST MEDICAL HISTORY :  He  has a past medical history of CAD (coronary artery disease), Diabetes mellitus without complication ([place]), Diastolic dysfunction, ETOH abuse, H/O ventricular fibrillation, Hypertensive heart disease, Intracranial hemorrhage ([place]), Left eye injury, ST elevation MI (STEMI) ([place]) (06/12/15), and Stroke ([place]). PAST SURGICAL HISTORY: He  has a past surgical history that includes Coronary angioplasty and LEFT HEART CATH AND CORONARY ANGIOGRAPHY (N/A, 06/12/2015). No Known Allergies   New Events  Off sedative; on EEG  monitoring.  Not responding to commands. Tolerating SBT.   . chlorhexidine gluconate (MEDLINE KIT)  15 mL Mouth Rinse BID  . enoxaparin (LOVENOX) injection  40 mg Subcutaneous Q24H  . feeding supplement (PRO-STAT SUGAR FREE 64)  30 mL Per Tube Daily  . insulin aspart  0-20 Units Subcutaneous Q4H  . insulin glargine  10 Units Subcutaneous BID  . mouth rinse  15 mL Mouth Rinse Q2H  . pantoprazole sodium  40 mg Per Tube Daily  . PHENObarbital  60 mg Oral Daily  . phenytoin (DILANTIN) IV  100 mg Intravenous Q8H  . thiamine injection  100 mg Intravenous Daily  . topiramate  200 mg Oral BID     ROS- unable to obtain- pt on vent  PHYSICAL EXAM:    Blood pressure 124/66, pulse 95, temperature 99.5 F (37.5 C), resp. rate 19, height '6\' 1"'$  (1.854 m), weight 207 lb 0.2 oz (93.9 kg), SpO2 99 %.  Eyes: Pupil r 57m, reacting; left eye enucleation;  No pallor  Neck: No neck swelling  CVS:  -s1 s2 regular  Resp:Breath sounds equal; no rales  Abdomen:abd-soft, non tender,    BS+  Extremities- no calf swelling  Neuro:- not following commands; tone reduced;  Skin: no rash  Integumentary: No clubbing  Vent Mode: PSV;CPAP FiO2 (%):  [30 %] 30 % Set Rate:  [11 bmp] 11 bmp Vt Set:  [640 mL] 640 mL PEEP:  [5 cmH20] 5 cmH20 Pressure Support:  [8 cmH20] 8 cmH20 Plateau Pressure:  [13 cmH20-14 cmH20] 14 cmH20    LABS:  Results for  Richard, [name] (MRN [id number]) as of 07/31/2017 11:32  Ref. Range 07/31/2017 02:45 07/31/2017 03:28 07/31/2017 06:16 07/31/2017 08:39  Glucose-Capillary Latest Ref Range: 65 - 99 mg/dL  161 (H)  210 (H)  BASIC METABOLIC PANEL Unknown Rpt (A)     Sodium Latest Ref Range: 135 - 145 mmol/L 143     Potassium Latest Ref Range: 3.5 - 5.1 mmol/L 4.9     Chloride Latest Ref Range: 101 - 111 mmol/L 112 (H)     CO2 Latest Ref Range: 22 - 32 mmol/L 22     Glucose Latest Ref Range: 65 - 99 mg/dL 173 (H)     BUN Latest Ref Range: 6 - 20 mg/dL 29 (H)     Creatinine Latest Ref  Range: 0.61 - 1.24 mg/dL 0.85     Calcium Latest Ref Range: 8.9 - 10.3 mg/dL 8.7 (L)     Anion gap Latest Ref Range: 5 - 15  9     Magnesium Latest Ref Range: 1.7 - 2.4 mg/dL 2.2     GFR, Est Non African American Latest Ref Range: >60 mL/min >60     GFR, Est African American Latest Ref Range: >60 mL/min >60     WBC Latest Ref Range: 4.0 - 10.5 K/uL 6.5     RBC Latest Ref Range: 4.22 - 5.81 MIL/uL 4.56     Hemoglobin Latest Ref Range: 13.0 - 17.0 g/dL 13.6     HCT Latest Ref Range: 39.0 - 52.0 % 43.2     MCV Latest Ref Range: 78.0 - 100.0 fL 94.7     MCH Latest Ref Range: 26.0 - 34.0 pg 29.8     MCHC Latest Ref Range: 30.0 - 36.0 g/dL 31.5     RDW Latest Ref Range: 11.5 - 15.5 % 12.9     Platelets Latest Ref Range: 150 - 400 K/uL 153      Results for Richard, [name] (MRN [id number]) as of 07/31/2017 11:32  Ref. Range 07/29/2017 03:22 07/29/2017 19:43  PHENOBARBITAL Latest Ref Range: 15.0 - 30.0 ug/mL 23.0   Phenytoin Lvl Latest Ref Range: 10.0 - 20.0 ug/mL  15.8     MR brain 07/07/17  1. Partial and motion degraded exam without acute finding. 2. Remote posterior left frontal hemorrhage. 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 3. Extensive chronic small vessel ischemia and generalized atrophy.    CT brain 06/27/2017 1. Difficult to exclude trace acute parafalcine subdural hematoma (series 3, image 25), but this could be artifact due to motion. 2. Otherwise stable non contrast CT appearance of the brain, with multifocal encephalomalacia and advanced chronic small vessel disease. ASPECTS is 10.   Ct brain 07/03/17 1. No acute intracranial abnormality. 2. ASPECTS = 10. 3. Moderate cerebral atrophy with chronic small vessel ischemic disease with multifocal remote ischemic infarcts as above.   CHEST X-RAY:   5/6/ 19 Endotracheal tube 5.1 cm above the carina. Enteric catheter descends below the diaphragm. Cardiomediastinal silhouette is normal. Mediastinal contours  appear intact. Calcific atherosclerotic disease and tortuosity of the aorta.There is no evidence of pneumothorax. Left lower lobe atelectasis versus airspace consolidation. Small left pleural effusion cannot be excluded.Osseous structures are without acute abnormality. Soft tissues are grossly normal.   EEG 07/31/17 This EEG is indicative of encephalopathy of nonspecific etiologies.  Left lateralizing poorly formed,  irregular and attenuated  epileptiform discharges present throughout the recording.  No clinical subclinical seizures.  This finding suggestive of encephalopathy and cortical irritability across the  left hemisphere.  Clinical correlation is advised.  Echo 2016 - LVEF 60-65%, mild LVH, normal wall motion, diastolic dysfunction,   normal LV filling pressure, mild RAE.  Cath 2017  Acute inferolateral myocardial infarction due to occlusion of a very large first obtuse marginal in the mid segment.  Moderate proximal to mid LAD disease less than 60% obstructive. Luminal irregularities throughout the proximal and mid right coronary.  Mid inferior wall akinesis. EF 40-50%.  Successful angioplasty followed by stenting of the totally occluded obtuse marginal bifurcation stenosis with implantation of a single drug-eluting stent across the smaller branch reducing 100% stenosis to 0% with TIMI grade 3 flow. Final post dilation balloon diameter 3.25 mm. Angioplasty was performed on the smaller obtuse marginal branch prior to stenting.    06/30/2017 Ekg- sinus- , NS ST T changes    Assessment- Plan:      This is a 72 year old with a prior history of intracranial hemorrhage coronary artery disease type 2 diabetes and a listed history of CHF who presented with seizures.  He was not compliant with his medicines at home.  Seizures did not respond to initial therapy and he is required sedation to burst suppression for control.    PULMONARY Ventilatory dependent respiratory failure secondary  to status epilepticus. On SBT-Tolerating it very well.  Saturating well.   Extubation depends on improved  mental status. Daily SBT VAP precautions  CARDIOVASCULAR History of coronary disease History of STEMI status post PCI History of heart failure with preserved EF Patient currently hemodynamic is stable. Not requiring pressors at this time.  Bradycardia has resolved. Resume cardiac meds (home meds)  GASTROINTESTINAL No acute issues at this time GI prophylaxis with Protonix.   At moderate nutritional risk.  Continue tube feeds  HEMATOLOGIC No acute issues at this time  DVT prophylaxis with Lovenox   RENAL No acute issues at this time Monitor ins and outs    ENDOCRINE  Diabetes   Continue with Lantus and insulin sliding scale.  Sugars are relatively well controlled at this time.  NEUROLOGIC Status epilepticus with history of noncompliance to medications Neurology following  Continue with Vimpat, Keppra, Dilantin.      Full code    Thank you for letting me participate in the care of your patient.  ^^^^^^^^^^ I  Have personally spent  60  Minutes  In the care of this Patient providing Critical care Services; Time includes review of chart, labs, imaging, coordinating care with other physicians and healthcare team members. Also includes time for frequent reevaluation and additional treatment implementation due to change in clinical condiiton of patient. Excludes time spent for Procedure and Teaching.   ^^^^^^^^^^  Note subject to typographical and grammatical errors;   Any formal questions or concerns about the content, text, or information contained within the body of this dictation should be directly addressed to the physician  for  clarification.   Richard Lance, [name] Pager: [phone]

## 2017-07-31 NOTE — Progress Notes (Signed)
Pt transported to and from MRI without event.  RT will continue to monitor 

## 2017-07-31 NOTE — Progress Notes (Deleted)
Subjective: Weaning sedation overnight, no longer on versed or Fentanyl, breathing over the vent.   Exam: Vitals:   07/31/17 0800 07/31/17 0818  BP: (!) 147/79 137/72  Pulse: 87 90  Resp: 14 13  Temp: 100.2 F (37.9 C) 100 F (37.8 C)  SpO2: 100% 100%     Neuro: MS: unresponsive,no grmiaceto nox sitm.  CN: Right pupil reactive, left enucleation.  Motor: no withdrawal bilaterally. Sensory: as above.   Pertinent Labs: Creatinine 0.85  Impression: 72 year old male with complex partial status epilepticus likely secondary to previous intracranial hemorrhage.  He had a similar presentation a month prior, and was on Keppra but was unable to swallow the tablets and they had not been able to get a provider to call in the liquid.   He has been off sedation since 5/4.  He has had return of a PLEDs pattern following weaning, but no clear seizures.  Given the partial nature, I would not aggressively pursue this pattern unless he were to have evidence of seizure activity. Continues to be on LTM --no seizure noted but awaiting final read  Recommendations: 1) continue Keppra 1500 twice daily 2) continue Vimpat 200 mg twice daily 3) continue Dilantin 100 mg 3 times daily 4) topiramate 200 mill grams twice daily 5) continue phenobarbital  daily.   Felicie Morn PA-C [facility] [phone]  M-F  (9:00 am- 5:00 PM)  07/31/2017, 10:22 AM

## 2017-07-31 NOTE — Progress Notes (Signed)
D/C LTM EEG per Dr Lindzen 

## 2017-08-01 ENCOUNTER — Inpatient Hospital Stay (HOSPITAL_COMMUNITY): Payer: PPO

## 2017-08-01 DIAGNOSIS — R0603 Acute respiratory distress: Secondary | ICD-10-CM

## 2017-08-01 DIAGNOSIS — G9340 Encephalopathy, unspecified: Secondary | ICD-10-CM

## 2017-08-01 DIAGNOSIS — K117 Disturbances of salivary secretion: Secondary | ICD-10-CM

## 2017-08-01 DIAGNOSIS — R451 Restlessness and agitation: Secondary | ICD-10-CM

## 2017-08-01 DIAGNOSIS — Z515 Encounter for palliative care: Secondary | ICD-10-CM

## 2017-08-01 LAB — CBC
HEMATOCRIT: 29.6 % — AB (ref 39.0–52.0)
Hemoglobin: 9 g/dL — ABNORMAL LOW (ref 13.0–17.0)
MCH: 29 pg (ref 26.0–34.0)
MCHC: 30.4 g/dL (ref 30.0–36.0)
MCV: 95.5 fL (ref 78.0–100.0)
PLATELETS: 218 10*3/uL (ref 150–400)
RBC: 3.1 MIL/uL — ABNORMAL LOW (ref 4.22–5.81)
RDW: 12.5 % (ref 11.5–15.5)
WBC: 13.2 10*3/uL — AB (ref 4.0–10.5)

## 2017-08-01 LAB — BASIC METABOLIC PANEL
Anion gap: 10 (ref 5–15)
BUN: 29 mg/dL — ABNORMAL HIGH (ref 6–20)
CALCIUM: 8.5 mg/dL — AB (ref 8.9–10.3)
CO2: 23 mmol/L (ref 22–32)
Chloride: 111 mmol/L (ref 101–111)
Creatinine, Ser: 0.84 mg/dL (ref 0.61–1.24)
GLUCOSE: 178 mg/dL — AB (ref 65–99)
POTASSIUM: 3.9 mmol/L (ref 3.5–5.1)
Sodium: 144 mmol/L (ref 135–145)

## 2017-08-01 LAB — HEMOGLOBIN AND HEMATOCRIT, BLOOD
HCT: 29.9 % — ABNORMAL LOW (ref 39.0–52.0)
Hemoglobin: 9.1 g/dL — ABNORMAL LOW (ref 13.0–17.0)

## 2017-08-01 LAB — GLUCOSE, CAPILLARY
GLUCOSE-CAPILLARY: 214 mg/dL — AB (ref 65–99)
Glucose-Capillary: 157 mg/dL — ABNORMAL HIGH (ref 65–99)
Glucose-Capillary: 222 mg/dL — ABNORMAL HIGH (ref 65–99)

## 2017-08-01 MED ORDER — MORPHINE 100MG IN NS 100ML (1MG/ML) PREMIX INFUSION
2.0000 mg/h | INTRAVENOUS | Status: DC
  Administered 2017-08-01: 2 mg/h via INTRAVENOUS
  Administered 2017-08-02 – 2017-08-03 (×3): 8 mg/h via INTRAVENOUS
  Filled 2017-08-01 (×5): qty 100

## 2017-08-01 MED ORDER — MORPHINE BOLUS VIA INFUSION
2.0000 mg | INTRAVENOUS | Status: DC | PRN
  Filled 2017-08-01: qty 4

## 2017-08-01 MED ORDER — ONDANSETRON HCL 4 MG/2ML IJ SOLN: 4.0000 mg | Freq: Four times a day (QID) | INTRAMUSCULAR | Status: DC | PRN

## 2017-08-01 MED ORDER — ACETAMINOPHEN 650 MG RE SUPP: 650.0000 mg | Freq: Four times a day (QID) | RECTAL | Status: DC | PRN

## 2017-08-01 MED ORDER — MORPHINE SULFATE (PF) 4 MG/ML IV SOLN
2.0000 mg | INTRAVENOUS | Status: DC | PRN
Start: 2017-08-01 — End: 2017-08-01
  Administered 2017-08-01 (×3): 4 mg via INTRAVENOUS
  Filled 2017-08-01 (×3): qty 1

## 2017-08-01 MED ORDER — LORAZEPAM 2 MG/ML IJ SOLN: 2.0000 mg | INTRAMUSCULAR | Status: DC | PRN

## 2017-08-01 MED ORDER — HALOPERIDOL LACTATE 5 MG/ML IJ SOLN: 2.0000 mg | Freq: Four times a day (QID) | INTRAMUSCULAR | Status: DC | PRN

## 2017-08-01 MED ORDER — MORPHINE BOLUS VIA INFUSION
2.0000 mg | INTRAVENOUS | Status: DC | PRN
  Filled 2017-08-01: qty 5

## 2017-08-01 MED ORDER — MORPHINE SULFATE (PF) 4 MG/ML IV SOLN
INTRAVENOUS | Status: AC
  Administered 2017-08-01: 4 mg
  Filled 2017-08-01: qty 1

## 2017-08-01 MED ORDER — SCOPOLAMINE 1 MG/3DAYS TD PT72
1.0000 | MEDICATED_PATCH | TRANSDERMAL | Status: DC
  Administered 2017-08-01: 1.5 mg via TRANSDERMAL
  Filled 2017-08-01: qty 1

## 2017-08-01 MED ORDER — BIOTENE DRY MOUTH MT LIQD: 15.0000 mL | OROMUCOSAL | Status: DC | PRN

## 2017-08-01 MED ORDER — GLYCOPYRROLATE 0.2 MG/ML IJ SOLN
0.2000 mg | INTRAMUSCULAR | Status: DC
  Administered 2017-08-01 – 2017-08-02 (×4): 0.2 mg via INTRAVENOUS
  Filled 2017-08-01 (×4): qty 1

## 2017-08-01 MED ORDER — MIDAZOLAM HCL 2 MG/2ML IJ SOLN
2.0000 mg | INTRAMUSCULAR | Status: DC | PRN
  Administered 2017-08-01: 4 mg via INTRAVENOUS
  Filled 2017-08-01: qty 4

## 2017-08-01 MED ORDER — POLYVINYL ALCOHOL 1.4 % OP SOLN
1.0000 [drp] | Freq: Four times a day (QID) | OPHTHALMIC | Status: DC | PRN
  Administered 2017-08-03: 1 [drp] via OPHTHALMIC
  Filled 2017-08-01: qty 15

## 2017-08-01 MED ORDER — GLYCOPYRROLATE 0.2 MG/ML IJ SOLN
0.2000 mg | INTRAMUSCULAR | Status: DC | PRN
  Administered 2017-08-01: 0.2 mg via INTRAVENOUS
  Filled 2017-08-01: qty 1

## 2017-08-01 MED ORDER — MIDAZOLAM HCL 2 MG/2ML IJ SOLN
2.0000 mg | INTRAMUSCULAR | Status: DC | PRN
  Administered 2017-08-01: 2 mg via INTRAVENOUS
  Filled 2017-08-01: qty 2

## 2017-08-01 NOTE — Procedures (Signed)
Extubation Procedure Note  Patient Details:   Name: Micheil Klaus DOB: 1946/03/09 MRN: [id number]   Airway Documentation:    Vent end date: 08/01/17 Vent end time: 1545   Evaluation  O2 sats: stable throughout Complications: No apparent complications Patient did tolerate procedure well. Bilateral Breath Sounds: Diminished, Clear   No  Pt terminally extubated to 4L [place] per MD order as per family request. MD RN and family at bedside. NARD Will cont to monitor.   Betsy Pries 08/01/2017, 3:57 PM

## 2017-08-01 NOTE — Consult Note (Signed)
Consultation Note Date: 08/01/2017    Patient Name: Richard [name]  DOB: 02/23/1946  MRN: [id number]  Age / Sex: 72 y.o., male  PCP: Maury Dus, MD Referring Physician: Juanito Doom, MD  Reason for Consultation: Establishing goals of care  HPI/Patient Profile: 72 y.o. male  with past medical history of stroke, [place] 2016, CAD, MI, vfib arrest, diastolic dysfunction, DM, ETOH abuse admitted on 07/01/2017 with right weakness and jerking and difficulty speaking. Recent hospitalization for diagnosis of complex partial seizure thought to be secondary to left parietal encephalomalacia. In ED, patient was actively seizing and was loaded with keppra and given fosphenytoin. Required intubation for airway protection. Neurology following. Patient receiving keppra, vimpat, dilantin, topomax, and phenobarbital. Continuous EEG reveals encephalopathy of nonspecific etiology. CT brain 4/29 with multifocal encephalomalacia and advanced chronic small vessel disease. MRI on 5/6 revealed 1cm acute ischemic nonhemorrhagic infarct involving subcortical/deep white matter of left parietal lobe, remote left posterior frontal hemorrhage, and atrophy with extensive chronic microvascular ischemic disease. Off sedation since 5/4 at 2pm. Patient remains obtunded on ventilator. Palliative medicine consultation for goals of care.   Clinical Assessment and Goals of Care:  1400: I have reviewed medical records, discussed with care team, and met with son Dwaine Gale) and sister Velva Harman) at bedside to discuss diagnosis, prognosis, [place], EOL wishes, disposition and options. The patient wife, Vaughan Basta, is on speaker phone.    Introduced Palliative Medicine as specialized medical care for people living with serious illness. It focuses on providing relief from the symptoms and stress of a serious illness. The goal is to improve quality of life for both the patient  and the family.  We discussed a brief life review of the patient. Married to [place] for 40+ years. 5 children. Worked for [employer] in Engineer, materials. Prior to hospitalization, patient living at home with wife. Functionally intact and able to ambulate without assistance and perform ADL's. Dwaine Gale recalls his recent hospitalization for seizures. Vaughan Basta tells me he was having difficulty swallowing his seizure medications.   Discussed hospital diagnoses and interventions including results from EEG, CT's and MRI. Explained concern that he remains obtunded/unresponsive despite being off sedation. He has been able to wean on ventilator and breath over the ventilator, but concern with extubation due to mental status. Also, big picture in regards to mental status including being fed by feeding tube.   Advanced directives, concepts specific to code status, and artifical feeding and hydration were discussed. Vaughan Basta speaks firmly of her husband's wishes against heroic measures. "no heroic measures." I further explored this regarding resuscitation, life support, and feeding tube. She confirms he would NOT want to be kept alive in his current state of health. Velva Harman and [place] confirmed that the Saab's have always been open and honest regarding EOL conversations and that yes he has spoken of his wishes against prolonged heroic measures.   The difference between aggressive medical intervention and comfort care was considered in light of the patient's goals of care, including compassionate extubation with goal for comfort focused care  if his respiratory status decompensates after extubation. Vaughan Basta asks if he "will die" after the tube is removed. I explained concern with unresponsiveness and high risk for a decline when the tube is removed. Vaughan Basta becomes frantic on speaker phone and tells me she will leave the house immediately and come to the hospital to remove the tube today. Explained that I was not here to rush any  decision making and allow her time to process this decision/plan extubation in the next few days. Vaughan Basta is firm on coming to hospital today to extubate.   Answered questions and concerns for [place].   [id number]: Notifed by RN that more family is at bedside. Upon arrival to room, wife, sister-in-law, and friend now at bedside. Also Velva Harman and [place]. PCCM MD visited with family at bedside. Wife, Vaughan Basta, confirms the patient's wishes against prolonged heroic measures, firm on her decision to remove the breathing tube and ensure he is comfortable. She understands poor prognosis of hours-days after extubated. Discussed extubation process with family. Educated on symptom management medications and EOL expectations.   Stayed at bedside with RN and family during extubation and post-extubation. Patient required multiple prn doses of morphine. Morphine gtt initiated. Versed and Robinul given. Scopolamine patch applied.   Chaplain notified of EOL care and family request for prayer.   I ensured patient was comfort prior to leaving family. Again prepared them that this could be a period of hours to days, but his comfort will be our focus. PMT contact information given.    SUMMARY OF RECOMMENDATIONS    GOC discussed with wife, son, and other family members present.   Wife clear on the patient's wishes against prolonged heroic measures. He would NOT want resuscitation, trach/prolonged ventilation, or feeding tube. She wishes to terminally extubate and transition to comfort measures only.   DNR/DNI  Extubate to comfort measures. No escalation of care.   Symptom management--see below.  Family understands prognosis of hours-days.   PCCM and neurology notified.   PMT will follow.  Code Status/Advance Care Planning:  DNR  Symptom Management:   Morphine 2-66m/hr continuous infusion  RN may bolus Morphine via infusion 2-57mIV q1562mprn pain/dyspnea/air hunger  Versed 2-4mg33m q1h prn  agitation/anxiety  Scopolamine patch  Robinul 0.2mg 23mq4h  Robinul 0.2mg I57m4h prn excessive secretions  Palliative Prophylaxis:   Aspiration, Delirium Protocol, Frequent Pain Assessment, Oral Care and Turn Reposition  Psycho-social/Spiritual:   Desire for further Chaplaincy support: yes  Additional Recommendations: Caregiving  Support/Resources and Education on Hospice  Prognosis:   Hours - Days  Discharge Planning: Anticipated Hospital Death      Primary Diagnoses: Present on Admission: . CAD (coronary artery disease) . Essential hypertension . Hyperlipidemia . Type 2 diabetes mellitus with peripheral neuropathy (HCC) . Acute encephalopathy . Status epilepticus (HCC)  [place] reviewed the medical record, interviewed the patient and family, and examined the patient. The following aspects are pertinent.  Past Medical History:  Diagnosis Date  . CAD (coronary artery disease)    a. 05/2015 [place] STEMI/PCI: LM nl, LAD 50p, Om1 100 (3.0 x 16mm P42ms Premier DES), OM2/3 small, RCA 40p/d, EF 45-50%.  . Diabetes mellitus without complication (HCC)   [place]    a. 11/2014 Echo: EF 60-65%, gr1 DD, mildly dil RA.  . ETOH abuse   . H/O ventricular fibrillation    a. 05/2015 in setting of inferolateral STEMI.  . Hypertensive heart disease   . Intracranial hemorrhage (  [place])    a. 11/2014 in setting of marked HTN->3.5x2.5 left frontal ICH (12-44m by MRI); b. 11/2014 MRA no evidence of AVM or CAA; c. 11/2014 Carotid U/S: unremarkable.  . 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 ST elevation [place] (STEMI) ([place] 06[place]12[place]  . Stroke ([facility]    Social History   Socioeconomic History  . Marital status: Married    Spouse name: Not on file  . Number of children: Not on file  . Years of education: Not on file  . Highest education level: Not on file  Occupational History  . Not on file  Social Needs  . Financial resource strain: Not on file  . Food  insecurity:    Worry: Not on file    Inability: Not on file  . Transportation needs:    Medical: Not on file    Non-medical: Not on file  Tobacco Use  . Smoking status: Never Smoker  . Smokeless tobacco: Never Used  Substance and Sexual Activity  . Alcohol use: No    Alcohol/week: 0.0 oz    Comment: prev heavy  . Drug use: No  . Sexual activity: Not on file  Lifestyle  . Physical activity:    Days per week: Not on file    Minutes per session: Not on file  . Stress: Not on file  Relationships  . Social connections:    Talks on phone: Not on file    Gets together: Not on file    Attends religious service: Not on file    Active member of club or organization: Not on file    Attends meetings of clubs or organizations: Not on file    Relationship status: Not on file  Other Topics Concern  . Not on file  Social History Narrative   [place]   Family History  Problem Relation Age of Onset  . Stroke Maternal Grandmother   . Healthy Mother   . Other Father        hx unknown   Scheduled Meds: . chlorhexidine gluconate (MEDLINE KIT)  15 mL Mouth Rinse BID  . glycopyrrolate  0.2 mg Intravenous Q4H  . mouth rinse  15 mL Mouth Rinse Q2H  . scopolamine  1 patch Transdermal Q72H   Continuous Infusions: . morphine 8 mg/hr (08/01/17 1729)   PRN Meds:.acetaminophen, acetaminophen, antiseptic oral rinse, glycopyrrolate, iopamidol, LORazepam, midazolam, morphine, ondansetron (ZOFRAN) IV, polyvinyl alcohol Medications Prior to Admission:  Prior to Admission medications   Medication Sig Start Date End Date Taking? Authorizing Provider  aspirin 325 MG tablet Take 325 mg by mouth every other day.   Yes [provider]  carvedilol (COREG) 6.25 MG tablet Take 1 tablet (6.25 mg total) by mouth 2 (two) times daily with a meal. 06/14/15  Yes BTheora Gianotti NP  clopidogrel (PLAVIX) 75 MG tablet Take 75 mg by mouth daily.   Yes [provider]    insulin NPH Human (HUMULIN N,NOVOLIN N) 100 UNIT/ML injection Inject 0.15 mLs (15 Units total) into the skin 2 (two) times daily before a meal. 07/13/17  Yes GPatrecia Pour MD  lisinopril-hydrochlorothiazide (PRINZIDE,ZESTORETIC) 20-12.5 MG tablet Take 2 tablets by mouth daily.    Yes [provider]  naproxen sodium (ALEVE) 220 MG tablet Take 440 mg by mouth 2 (two) times daily as needed (pain/headache).   Yes [provider]  amLODipine (NORVASC) 10 MG tablet Take 1 tablet (10  mg total) by mouth daily. Patient not taking: Reported on 07/02/2017 12/26/14   Angiulli, Lavon Paganini, PA-C  atorvastatin (LIPITOR) 40 MG tablet Take 40 mg by mouth daily.     [provider]  levETIRAcetam (KEPPRA) 750 MG tablet Take 1 tablet (750 mg total) by mouth 2 (two) times daily. Patient not taking: Reported on 06/26/2017 07/13/17   Patrecia Pour, MD  Multiple Vitamin (MULTIVITAMIN WITH MINERALS) TABS tablet Take 1 tablet by mouth daily. Patient not taking: Reported on 07/03/2017 12/26/14   Angiulli, Lavon Paganini, PA-C  nitroGLYCERIN (NITROSTAT) 0.4 MG SL tablet Place 1 tablet (0.4 mg total) under the tongue every 5 (five) minutes as needed for chest pain. Patient not taking: Reported on 07/04/2017 06/22/15   Imogene Burn, PA-C   No Known Allergies Review of Systems  Unable to perform ROS: Mental status change   Physical Exam  Constitutional: He appears ill. He is intubated.  HENT:  Head: Normocephalic and atraumatic.  Cardiovascular: Regular rhythm.  Pulmonary/Chest: No tachypnea. He is intubated. No respiratory distress.  Neurological: He is unresponsive.  Responds to noxious stimuli, otherwise obtunded and not following commands.   Skin: Skin is warm and dry.  Nursing note and vitals reviewed.  Vital Signs: BP 127/77   Pulse 87   Temp 98.3 F (36.8 C) (Axillary)   Resp 18   Ht _0  (1.854 m)   Wt 94.1 kg (207 lb 7.3 oz)   SpO2 100%   BMI 27.37 kg/m  Pain Scale: CPOT      SpO2: SpO2: 100 % O2 Device:SpO2: 100 % O2 Flow Rate: .   IO: Intake/output summary:   Intake/Output Summary (Last 24 hours) at 08/01/2017 1745 Last data filed at 08/01/2017 1653 Gross per 24 hour  Intake 1911.27 ml  Output 1200 ml  Net 711.27 ml    LBM: Last BM Date: (UTA) Baseline Weight: Weight: 97.6 kg (215 lb 2.7 oz) Most recent weight: Weight: 94.1 kg (207 lb 7.3 oz)     Palliative Assessment/Data: PPS 10%   Flowsheet Rows     Most Recent Value  Intake Tab  Referral Department  Critical care  Unit at Time of Referral  ICU  Palliative Care Primary Diagnosis  Neurology  Palliative Care Type  New Palliative care  Reason for referral  Clarify Goals of Care, End of Life Care Assistance  Date first seen by Palliative Care  08/01/17  Clinical Assessment  Palliative Performance Scale Score  10%  Psychosocial & Spiritual Assessment  Palliative Care Outcomes  Patient/Family meeting held?  Yes  Who was at the meeting?  wife, son, sister, sister-in-law  Palliative Care Outcomes  Clarified goals of care, Provided psychosocial or spiritual support, ACP counseling assistance, Improved pain interventions, Improved non-pain symptom therapy, Provided end of life care assistance, Changed to focus on comfort, Changed CPR status  Patient/Family wishes: Interventions discontinued/not started   Mechanical Ventilation, Tube feedings/TPN      Time In: 1400-1430, 1525-1710 Time Total: 128mn Greater than 50%  of this time was spent counseling and coordinating care related to the above assessment and plan.  Signed by:  MIhor Dow FNP-C Palliative Medicine Team  Phone: [phone]: [phone]  Please contact Palliative Medicine Team phone at [phone] questions and concerns.  For individual provider: See AShea Evans

## 2017-08-01 NOTE — Progress Notes (Signed)
CDS referral made today at 1645. Instructed to call back with cardiac time of death. Referral # [id number]

## 2017-08-01 NOTE — Progress Notes (Addendum)
PULMONARY / CRITICAL CARE MEDICINE  CRITICAL CARE PROGRESS NOTE  ICU day #     9 Hospital day # 9 Mechanical Ventilation day #  9  Name: Richard [name] MRN: [id number] DOB: 12-Dec-1945    ADMISSION DATE:  07/15/2017 CHIEF COMPLAINT:  Status epilepticus HISTORY OF PRESENT ILLNESS:   Orrie Schubert is a 72 yo M with history of L frontal stroke, intracranial hemorrhage 2016, CAD, STEMI s/p PCI, VFib arrest, HFpEF (no recent echo), T2DM, and recent diagnosis of complex partial seizure thought to be secondary to L parietal encephalomalacia who presented to the ED with acute encephalopathy thought to be due to acute CVA but found to be seizing. He was recently admitted for seizures and was discharged on 4/18 with instructions to start [place]. Wife stated that patient has difficulty swallowing the medication and has not been taking it at home.  4/29 wife noted patient was aphasic and had jerking of RUE and proceeded to call EMS. He was in his usual state of health prior to this. In the ED, he was noted to be actively seizing and was loaded with Keppra and given fosphenytoin as well. He continued to seize and required intubation for airway protection and initiation of propofol gtt for burst-suppression.  Patient had been having breakthrough seizures on propofol.  Patient was started on Versed and phenobarbital Patient has been off Versed drip since 2 PM on 07/29/2017  PAST MEDICAL HISTORY :  He  has a past medical history of CAD (coronary artery disease), Diabetes mellitus without complication ([place]), Diastolic dysfunction, ETOH abuse, H/O ventricular fibrillation, Hypertensive heart disease, Intracranial hemorrhage ([place]), Left eye injury, ST elevation MI (STEMI) ([place]) (06/12/15), and Stroke ([place]). PAST SURGICAL HISTORY: He  has a past surgical history that includes Coronary angioplasty and LEFT HEART CATH AND CORONARY ANGIOGRAPHY (N/A, 06/12/2015).    New Events  5/6 Off sedative; on EEG monitoring.  Not  responding to commands. Tolerating SBT.  5/7 Not following commands, off sedation. Mild grimace noted. No overt bleeding reported. Urinary retention reported- PVR - 800+ml  PC meeting scheduled this afternoon with FAMILY.  . chlorhexidine gluconate (MEDLINE KIT)  15 mL Mouth Rinse BID  . enoxaparin (LOVENOX) injection  40 mg Subcutaneous Q24H  . feeding supplement (PRO-STAT SUGAR FREE 64)  30 mL Per Tube Daily  . insulin aspart  0-20 Units Subcutaneous Q4H  . insulin glargine  10 Units Subcutaneous BID  . mouth rinse  15 mL Mouth Rinse Q2H  . pantoprazole sodium  40 mg Per Tube Daily  . PHENObarbital  60 mg Oral Daily  . phenytoin (DILANTIN) IV  100 mg Intravenous Q8H  . thiamine injection  100 mg Intravenous Daily  . topiramate  200 mg Oral BID   ROS- unable to obtain- pt on vent  PHYSICAL EXAM:   Blood pressure 132/76, pulse 82, temperature 98.7 F (37.1 C), temperature source Axillary, resp. rate 12, height _0  (1.854 m), weight 207 lb 7.3 oz (94.1 kg), SpO2 99 %.   Eyes: Pupil right eye  18m, reacting; left eye enucleation;  No pallor  Neck: No neck swelling  CVS:  -s1 s2 regular  Resp:Breath sounds equal; no rales  Abdomen:abd-soft, non tender,    BS+  Extremities- no calf swelling  Neuro:- not following commands; tone reduced;  Skin: no rash  Integumentary: No clubbing  Vent Mode: PSV FiO2 (%):  [30 %] 30 % Set Rate:  [11 bmp] 11 bmp Vt Set:  [640 mL] 640  mL PEEP:  [5 cmH20] 5 cmH20 Pressure Support:  [8 cmH20] 8 cmH20 Plateau Pressure:  [11 cmH20-17 cmH20] 11 cmH20   LABS:     Results for DAMARCO, KEYSOR (MRN [id number]) as of 07/31/2017 11:32  Ref. Range 07/29/2017 03:22 07/29/2017 19:43  PHENOBARBITAL Latest Ref Range: 15.0 - 30.0 ug/mL 23.0   Phenytoin Lvl Latest Ref Range: 10.0 - 20.0 ug/mL  15.8      CBC Latest Ref Rng & Units 08/01/2017 08/01/2017 07/31/2017  WBC 4.0 - 10.5 K/uL - 13.2(H) 6.5  Hemoglobin 13.0 - 17.0 g/dL 9.1(L) 9.0(L) 13.6   Hematocrit 39.0 - 52.0 % 29.9(L) 29.6(L) 43.2  Platelets 150 - 400 K/uL - 218 153    BMP Latest Ref Rng & Units 08/01/2017 07/31/2017 07/29/2017  Glucose 65 - 99 mg/dL 178(H) 173(H) 187(H)  BUN 6 - 20 mg/dL 29(H) 29(H) 19  Creatinine 0.61 - 1.24 mg/dL 0.84 0.85 0.95  Sodium 135 - 145 mmol/L 144 143 143  Potassium 3.5 - 5.1 mmol/L 3.9 4.9 4.2  Chloride 101 - 111 mmol/L 111 112(H) 111  CO2 22 - 32 mmol/L _0 Calcium 8.9 - 10.3 mg/dL 8.5(L) 8.7(L) 8.4(L)    Ct brain 07/03/17 1. No acute intracranial abnormality. 2. ASPECTS = 10. 3. Moderate cerebral atrophy with chronic small vessel ischemic disease with multifocal remote ischemic infarcts as above.   07-31-17 MRI brain  1. 1 cm acute ischemic nonhemorrhagic infarct involving the subcortical/deep white matter of the left parietal lobe. 2. Remote left posterior frontal hemorrhage, with smaller right posterior frontal and left occipital lobe infarcts. 3. Atrophy with extensive chronic microvascular ischemic disease.  cxr 08-01-17  The tip of the endotracheal tube is now 5.6 cm above the carina. Bibasilar linear atelectasis remains left-greater-than-right. No definite pleural effusion is seen although a small left effusion cannot be excluded. The heart is mildly enlarged and stable. No bony abnormality is seen.    CT brain 07/13/2017 1. Difficult to exclude trace acute parafalcine subdural hematoma (series 3, image 25), but this could be artifact due to motion. 2. Otherwise stable non contrast CT appearance of the brain, with multifocal encephalomalacia and advanced chronic small vessel disease. ASPECTS is 10.      EEG 07/31/17 This EEG is indicative of encephalopathy of nonspecific etiologies.  Left lateralizing poorly formed,  irregular and attenuated  epileptiform discharges present throughout the recording.  No clinical subclinical seizures.  This finding suggestive of encephalopathy and cortical irritability across the left  hemisphere.  Clinical correlation is advised.  Echo 2016 - LVEF 60-65%, mild LVH, normal wall motion, diastolic dysfunction,   normal LV filling pressure, mild RAE.  Cath 2017  Acute inferolateral myocardial infarction due to occlusion of a very large first obtuse marginal in the mid segment.  Moderate proximal to mid LAD disease less than 60% obstructive. Luminal irregularities throughout the proximal and mid right coronary.  Mid inferior wall akinesis. EF 40-50%.  Successful angioplasty followed by stenting of the totally occluded obtuse marginal bifurcation stenosis with implantation of a single drug-eluting stent across the smaller branch reducing 100% stenosis to 0% with TIMI grade 3 flow. Final post dilation balloon diameter 3.25 mm. Angioplasty was performed on the smaller obtuse marginal branch prior to stenting.    07/07/2017 Ekg- sinus- , NS ST T changes    Assessment- Plan:      This is a 72 year old with a prior history of intracranial hemorrhage coronary artery disease type 2 diabetes and a  listed history of CHF who presented with seizures.  He was not compliant with his medicines at home.  Seizures did not respond to initial therapy and he is required sedation to burst suppression for control.    PULMONARY Ventilatory dependent respiratory failure secondary to status epilepticus.   SBT-Tolerating  well.  RSBI - < 100 Extubation defered due to encephalopathy Daily SBT VAP precautions  CARDIOVASCULAR  History of coronary disease History of STEMI status post PCI History of heart failure with preserved EF Patient currently hemodynamic is stable. Not requiring pressors at this time.  Bradycardia has resolved. Resume cardiac meds (home meds) -ASA, Plavix  if Hb remains stable   GASTROINTESTINAL  GI prophylaxis with Protonix.   Continue tube feeds  HEMATOLOGIC Monitor cbc  DVT prophylaxis with Lovenox    Renal-  monitor renal fn, lytes  may need foley  reinserted    ENDOCRINE  Diabetes  Continue with Lantus and insulin sliding scale.    NEUROLOGIC Status epilepticus with history of noncompliance to medications H/o stroke; Encephalopathy may be from ischemic insult from prolonged seizures+ small vessel disease, cva.     Continue Keppra, Vimpat, Dilantin, Topamax and phenobarbital D/w Neurology  Full code   Goals of care d/w family planned.  Thank you for letting me participate in the care of your patient.  ^^^^^^^^^^ I  Have personally spent  50  Minutes  In the care of this Patient providing Critical care Services; Time includes review of chart, labs, imaging, coordinating care with other physicians and healthcare team members. Also includes time for frequent reevaluation and additional treatment implementation due to change in clinical condiiton of patient. Excludes time spent for Procedure and Teaching.   ^^^^^^^^^^  Note subject to typographical and grammatical errors;   Any formal questions or concerns about the content, text, or information contained within the body of this dictation should be directly addressed to the physician  for  clarification.   Evans Lance, MD Pulmonary and Critical Care Medicine [facility] Pager: [phone]   Addendum: 3.30pm  Family discussed with Palliative care; wife clearly wants patient to be terminally extubated and kept comfortable. She is aware of grave prognosis and eventual demise of patient. D/w RN And Palliative care. Neurology aware as well.

## 2017-08-01 NOTE — Progress Notes (Signed)
Subjective: Palliative care consult being considered.  Objective: BP (!) 146/79   Pulse 81   Temp 98.2 F (36.8 C) (Oral)   Resp 11   Ht  (1.854 m)   Wt 94.1 kg (207 lb 7.3 oz)   SpO2 100%   BMI 27.37 kg/m   Ment: Obtunded. Grimaces and withdraws to noxious. Not following commands. Not attempting to communicate.  CN: Pupil reactive OD. Intact cough and gag. Face symmetric. Motor/Sensory: Withdraws all 4 extremities equally to noxious.  Reflexes: Normoactive. Negative Babinski's.  MRI brain reveals that the parafalcine CT finding is not a hematoma, however, a subcentimeter acute focus of restricted diffusion was seen.   MRI brain: 1. 1 cm acute ischemic nonhemorrhagic infarct involving the subcortical/deep white matter of the left parietal lobe. 2. Remote left posterior frontal hemorrhage, with smaller right posterior frontal and left occipital lobe infarcts. 3. Atrophy with extensive chronic microvascular ischemic disease.   Assessment/Recommendations: 72 year old male with complex partial status epilepticus likely secondary to seizure onset zone due to previous intracranial hemorrhage.  1. Continue Keppra, Vimpat, Dilantin, Topamax and phenobarbital 2. Can restart ASA and Plavix as no acute or subacute hemorrhage seen on MRI. However, Hgb has dropped precipitously and CCM is continuing to hold antiplatelet agents until source of probable bleeding is found.   Electronically signed: Dr. Caryl Pina

## 2017-08-02 MED ORDER — GLYCOPYRROLATE 0.2 MG/ML IJ SOLN
0.3000 mg | INTRAMUSCULAR | Status: DC
  Administered 2017-08-02 – 2017-08-03 (×7): 0.3 mg via INTRAVENOUS
  Filled 2017-08-02 (×7): qty 2

## 2017-08-02 NOTE — Progress Notes (Signed)
Daily Progress Note   Patient Name: Richard [name]       Date: 08/02/2017 DOB: 05-18-1945  Age: 72 y.o. MRN#: [id number] Attending Physician: Lupita Leash, MD Primary Care Physician: Elias Else, MD Admit Date: 07/23/2017  Reason for Consultation/Follow-up: Establishing goals of care and Terminal Care  Subjective:  0930: Patient actively dying. Agonal respirations with periods of apnea. Audible secretions. Appears comfortable on continuous morphine infusion. No family at bedside.   Length of Stay: 9  Current Medications: Scheduled Meds:  . glycopyrrolate  0.3 mg Intravenous Q4H  . scopolamine  1 patch Transdermal Q72H    Continuous Infusions: . morphine 8 mg/hr (08/02/17 0900)    PRN Meds: acetaminophen, antiseptic oral rinse, glycopyrrolate, midazolam, morphine, ondansetron (ZOFRAN) IV, polyvinyl alcohol  Physical Exam  HENT:  Head: Normocephalic and atraumatic.  Cardiovascular: Regular rhythm.  Pulmonary/Chest: No accessory muscle usage. No tachypnea. No respiratory distress. He has rhonchi.  Shallow, agonal respirations. Periods of apnea. Audible secretions  Abdominal: There is no tenderness.  Neurological: He is unresponsive.  Nursing note and vitals reviewed.          Vital Signs: BP 127/77   Pulse (!) 107   Temp 98.3 F (36.8 C) (Axillary)   Resp (!) 7   Ht  (1.854 m)   Wt 94.1 kg (207 lb 7.3 oz)   SpO2 94%   BMI 27.37 kg/m  SpO2: SpO2: 94 % O2 Device: O2 Device: Nasal Cannula O2 Flow Rate:    Intake/output summary:   Intake/Output Summary (Last 24 hours) at 08/02/2017 9147 Last data filed at 08/02/2017 0900 Gross per 24 hour  Intake 549 ml  Output 2050 ml  Net -1501 ml   LBM: Last BM Date: (UTA) Baseline Weight: Weight: 97.6 kg (215 lb 2.7  oz) Most recent weight: Weight: 94.1 kg (207 lb 7.3 oz)       Palliative Assessment/Data: PPS 10%   Flowsheet Rows     Most Recent Value  Intake Tab  Referral Department  Critical care  Unit at Time of Referral  ICU  Palliative Care Primary Diagnosis  Neurology  Palliative Care Type  New Palliative care  Reason for referral  Clarify Goals of Care, End of Life Care Assistance  Date first seen by Palliative Care  08/01/17  Clinical Assessment  Palliative Performance Scale Score  10%  Psychosocial & Spiritual Assessment  Palliative Care Outcomes  Patient/Family meeting held?  Yes  Who was at the meeting?  wife, son, sister, sister-in-law  Palliative Care Outcomes  Clarified goals of care, Provided psychosocial or spiritual support, ACP counseling assistance, Improved pain interventions, Improved non-pain symptom therapy, Provided end of life care assistance, Changed to focus on comfort, Changed CPR status  Patient/Family wishes: Interventions discontinued/not started   Mechanical Ventilation, Tube feedings/TPN      Patient Active Problem List   Diagnosis Date Noted  . Palliative care by specialist   . Terminal care   . Acute respiratory distress   . Increased oropharyngeal secretions   . Agitation   . Status epilepticus (HCC) 06/30/2017  . Alcohol abuse 07/04/2017  . Acute encephalopathy 07/04/2017  . Seizure (HCC) 07/04/2017  . Seizures (HCC) 07/03/2017  . CAD (coronary artery disease) 06/22/2015  . Hypertensive heart disease 06/14/2015  . Hyperlipidemia 06/14/2015  . 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 Aphasia complicating stroke 02/09/2015  . 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 Type 2 diabetes mellitus with peripheral neuropathy (HCC)   . Essential hypertension   . Alcohol withdrawal (HCC)   . Cerebral hemorrhage (HCC)   . Endotracheally intubated     Palliative Care Assessment &  Plan   Patient Profile: 72 y.o. male  with past medical history of stroke, ICH 2016, CAD, MI, vfib arrest, diastolic dysfunction, DM, ETOH abuse admitted on 06/29/2017 with right weakness and jerking and difficulty speaking. Recent hospitalization for diagnosis of complex partial seizure thought to be secondary to left parietal encephalomalacia. In ED, patient was actively seizing and was loaded with keppra and given fosphenytoin. Required intubation for airway protection. Neurology following. Patient receiving keppra, vimpat, dilantin, topomax, and phenobarbital. Continuous EEG reveals encephalopathy of nonspecific etiology. CT brain 4/29 with multifocal encephalomalacia and advanced chronic small vessel disease. MRI on 5/6 revealed 1cm acute ischemic nonhemorrhagic infarct involving subcortical/deep white matter of left parietal lobe, remote left posterior frontal hemorrhage, and atrophy with extensive chronic microvascular ischemic disease. Off sedation since 5/4 at 2pm. Patient remains obtunded on ventilator. Palliative medicine consultation for goals of care. Terminal extubation to comfort care on 5/7.  Assessment: Seizures Status epilepticus VDRF Acute left parietal ischemic infarct Hx ICH  Recommendations/Plan:  Comfort measures only.  Continue current medication regimen to ensure comfort.   Actively dying. Hours-days. Anticipate hospital death.  Goals of Care and Additional Recommendations:  Limitations on Scope of Treatment: Full Comfort Care  Code Status: DNR/DNI   Code Status Orders  (From admission, onward)        Start     Ordered   08/01/17 1524  Do not attempt resuscitation (DNR)  Continuous    Question Answer Comment  In the event of cardiac or respiratory ARREST Do not call a "code blue"   In the event of cardiac or respiratory ARREST Do not perform Intubation, CPR, defibrillation or ACLS   In the event of cardiac or respiratory ARREST Use medication by any route,  position, wound care, and other measures to relive pain and suffering. May use oxygen, suction and manual treatment of airway obstruction as needed for comfort.      08/01/17 1523    Code Status History    Date Active Date Inactive Code Status Order ID Comments User Context   08/01/2017 1425 08/01/2017 1523 DNR [id number]  Alita Chyle,  NP Inpatient   07/01/2017 1220 08/01/2017 1425 Full Code [id number]  Burna Cash, MD ED   07/04/2017 0114 07/13/2017 1559 Full Code [id number]  Eduard Clos, MD Inpatient   12/16/2014 2153 12/26/2014 1558 Full Code [id number]  Charlton Amor, PA-C Inpatient   12/16/2014 2153 12/16/2014 2153 Full Code [id number]  Charlton Amor, PA-C Inpatient   12/10/2014 1002 12/16/2014 2153 Full Code [id number]  Lunette Stands, MD ED       Prognosis:   Hours - Days  Discharge Planning:  Anticipated Hospital Death  Care plan was discussed: No family at bedside  Thank you for allowing the Palliative Medicine Team to assist in the care of this patient.   Time In: 0930 Time Out: 0945 Total Time Prolonged Time Billed no      Greater than 50%  of this time was spent counseling and coordinating care related to the above assessment and plan.  Vennie Homans, FNP-C Palliative Medicine Team  Phone: [phone] Fax: [phone]  Please contact Palliative Medicine Team phone at [phone] for questions and concerns.

## 2017-08-02 NOTE — Progress Notes (Signed)
Nutrition Brief Note  Chart reviewed. Pt now transitioning to comfort care.  No further nutrition interventions warranted at this time.  Please re-consult as needed.   Alpa Salvo RD, LDN, CNSC [phone] Pager [phone] After Hours Pager    

## 2017-08-02 NOTE — Progress Notes (Signed)
Chaplain visited the room as part of consult.  Pt visited by [employer].Marland KitchenMarland KitchenChaplain prayed in room for PT.

## 2017-08-02 NOTE — Progress Notes (Addendum)
PULMONARY / CRITICAL CARE MEDICINE  ICU day #    10 Hospital day # 10 Mechanical Ventilation day #  9 - extubated 5/7 for comfort   Name: Richard [name] MRN: [id number] DOB: 1945/11/21    ADMISSION DATE:  2017/08/04 CHIEF COMPLAINT:  Status epilepticus HISTORY OF PRESENT ILLNESS:   Richard [name] is a 72 yo M with history of L frontal stroke, intracranial hemorrhage 2016, CAD, STEMI s/p PCI, VFib arrest, HFpEF (no recent echo), T2DM, and recent diagnosis of complex partial seizure thought to be secondary to L parietal encephalomalacia who presented to the ED with acute encephalopathy thought to be due to acute CVA but found to be seizing. He was recently admitted for seizures and was discharged on 4/18 with instructions to start Keppra. Wife stated that patient has difficulty swallowing the medication and has not been taking it at home.  4/29 wife noted patient was aphasic and had jerking of RUE and proceeded to call EMS. He was in his usual state of health prior to this. In the ED, he was noted to be actively seizing and was loaded with Keppra and given fosphenytoin as well. He continued to seize and required intubation for airway protection and initiation of propofol gtt for burst-suppression.  Patient had been having breakthrough seizures on propofol.  Patient was started on Versed and phenobarbital Patient has been off Versed drip since 2 PM on 07/29/2017  SUBJECTIVE/OVERNIGHT:  Extubated yesterday for comfort  No acute issues  Appears to be actively dying.  Sonorous, agonal respirations. Appears comfortable.    PHYSICAL EXAM:   Vitals:   08/01/17 1500 08/01/17 1530 08/01/17 1600 08/02/17 0700  BP: 126/74 127/77    Pulse: 87 87  (!) 107  Resp: (!) 29 18  (!) 7  Temp:   98.3 F (36.8 C)   TempSrc:   Axillary   SpO2: 99% 100%  94%  Weight:      Height:        General:  Chronically ill appearing male, dying, comfortable, no respiratory distress or increased WOB HEENT: MM pink/moist,  copious oral secretions  Neuro: comatose  CV: s1s2 rrr, no m/r/g PULM: even/non-labored, shallow, agonal at times, coarse  WU:JWJX, non-tender, bsx4 active  Extremities: warm/dry, no sig edema  Skin: no rashes or lesions   LABS:     Results for JACUB, WAITERS (MRN [id number]) as of 07/31/2017 11:32  Ref. Range 07/29/2017 03:22 07/29/2017 19:43  PHENOBARBITAL Latest Ref Range: 15.0 - 30.0 ug/mL 23.0   Phenytoin Lvl Latest Ref Range: 10.0 - 20.0 ug/mL  15.8      CBC Latest Ref Rng & Units 08/01/2017 08/01/2017 07/31/2017  WBC 4.0 - 10.5 K/uL - 13.2(H) 6.5  Hemoglobin 13.0 - 17.0 g/dL 2.1(H) 0.8(M) 57.8  Hematocrit 39.0 - 52.0 % 29.9(L) 29.6(L) 43.2  Platelets 150 - 400 K/uL - 218 153    BMP Latest Ref Rng & Units 08/01/2017 07/31/2017 07/29/2017  Glucose 65 - 99 mg/dL 469(G) 295(M) 841(L)  BUN 6 - 20 mg/dL 24(M) 01(U) 19  Creatinine 0.61 - 1.24 mg/dL 2.72 5.36 6.44  Sodium 135 - 145 mmol/L 144 143 143  Potassium 3.5 - 5.1 mmol/L 3.9 4.9 4.2  Chloride 101 - 111 mmol/L 111 112(H) 111  CO2 22 - 32 mmol/L Calcium 8.9 - 10.3 mg/dL 0.3(K) 7.4(Q) 5.9(D)    Ct brain 07/03/17 1. No acute intracranial abnormality. 2. ASPECTS = 10. 3. Moderate cerebral atrophy with chronic small vessel ischemic  disease with multifocal remote ischemic infarcts as above.  07-31-17 MRI brain 1. 1 cm acute ischemic nonhemorrhagic infarct involving the subcortical/deep white matter of the left parietal lobe. 2. Remote left posterior frontal hemorrhage, with smaller right posterior frontal and left occipital lobe infarcts. 3. Atrophy with extensive chronic microvascular ischemic disease.  cxr 08-01-17 The tip of the endotracheal tube is now 5.6 cm above the carina. Bibasilar linear atelectasis remains left-greater-than-right. No definite pleural effusion is seen although a small left effusion cannot be excluded. The heart is mildly enlarged and stable. No bony abnormality is seen.   CT brain 29-Jul-2017 1.  Difficult to exclude trace acute parafalcine subdural hematoma (series 3, image 25), but this could be artifact due to motion. 2. Otherwise stable non contrast CT appearance of the brain, with multifocal encephalomalacia and advanced chronic small vessel disease. ASPECTS is 10.   EEG 07/31/17 This EEG is indicative of encephalopathy of nonspecific etiologies.  Left lateralizing poorly formed,  irregular and attenuated  epileptiform discharges present throughout the recording.  No clinical subclinical seizures.  This finding suggestive of encephalopathy and cortical irritability across the left hemisphere.  Clinical correlation is advised.   07-29-2017 Ekg- sinus- , NS ST T changes    Assessment- Plan:      This is a 72 year old male with a prior history of intracranial hemorrhage coronary artery disease type 2 diabetes and a listed history of CHF who presented 4/29 with status epilepticus likely r/t medication noncompliance.   Seizures did not respond to initial therapy and he heavy sedation for burst suppression for control.  No purposeful waking after burst suppression despite sedation being off since 5/4.   MRI 5/6 revealed new acute ischemic L parietal infarct.   Decision made 5/7 with palliative care assistance for one-way extubation and transition to full comfort care.    VDRF r/t status epilepticus - extubated 5/7 for comfort.  Status epilepticus - s/p burst suppression  Acute L parietal ischemic infarct  Hx ICH  Hx DM, CAD, STEMI, CHF, ETOH abuse  PLAN -  Continue morphine gtt  scopolamine patch  PRN ativan  Continue robinul  Palliative care following  Anticipate death in hours - will leave in ICU this am.  If unchanged can likely tx out of unit later today  No family at bedside 5/8  Dirk Dress, NP 08/02/2017  9:31 AM Pager: [phone] or [phone]   P CCM Attending Note I have seen and examined the patient. Agree with APP's Assessment and plan. Please see my  comments as follows.   VDRF r/t status epilepticus - extubated 5/7 to CMO. Status epilepticus - s/p burst suppression  Acute L parietal ischemic infarct  Hx ICH  Hx DM, CAD, STEMI, CHF, ETOH abuse Pt is actively dying. Apneic spells, comatose, secretions drooling. Anticipate death within 24 hrs. Leave in ICU room. Family not at bedside at present.   Caryl Comes, MD Pulmonary and Critical Care Medicine [place] [facility] Pager: [phone]

## 2017-08-03 ENCOUNTER — Ambulatory Visit: Payer: PPO | Admitting: Neurology

## 2017-08-26 NOTE — Progress Notes (Signed)
PULMONARY / CRITICAL CARE MEDICINE  Name: Richard [name] MRN: [id number] DOB: 1945/09/04    ADMISSION DATE:  07/08/2017 CHIEF COMPLAINT:  Status epilepticus HISTORY OF PRESENT ILLNESS:   Richard [name] is a 72 yo M with history of L frontal stroke, intracranial hemorrhage 2016, CAD, STEMI s/p PCI, VFib arrest, HFpEF (no recent echo), T2DM, and recent diagnosis of complex partial seizure thought to be secondary to L parietal encephalomalacia who presented to the ED with acute encephalopathy thought to be due to acute CVA but found to be seizing. He was recently admitted for seizures and was discharged on 4/18 with instructions to start Keppra. Wife stated that patient has difficulty swallowing the medication and has not been taking it at home.  4/29 wife noted patient was aphasic and had jerking of RUE and proceeded to call EMS. He was in his usual state of health prior to this. In the ED, he was noted to be actively seizing and was loaded with Keppra and given fosphenytoin as well. He continued to seize and required intubation for airway protection and initiation of propofol gtt for burst-suppression.  Patient had been having breakthrough seizures on propofol.  Patient was started on Versed and phenobarbital Patient has been off Versed drip since 2 PM on 07/29/2017  SUBJECTIVE:  Extubated to CMO 5/7 Appears to be actively dying.    Appears comfortable.  On MSo4 drip 8 mg/hr   PHYSICAL EXAM:   Vitals:   08/23/2017 0400 08/23/17 0800 08-23-2017 0900 08/23/2017 1000  BP:      Pulse: (!) 124 (!) 117 (!) 117 (!) 119  Resp: (!) Temp:      TempSrc:      SpO2: (!) 71% (!) 71% (!) 82% (!) 78%  Weight:      Height:        General:  Chronically ill appearing male,  dying, comfortable, no respiratory distress or increased WOB HEENT: reduced oral secretions- has scopolamine patch Neuro: comatose  CV: s1s2 rrr PULM: even/non-labored, shallow, agonal at times, coarse  WU:JWJX  LABS:      Results for Richard, [name] (MRN [id number]) as of 07/31/2017 11:32  Ref. Range 07/29/2017 03:22 07/29/2017 19:43  PHENOBARBITAL Latest Ref Range: 15.0 - 30.0 ug/mL 23.0   Phenytoin Lvl Latest Ref Range: 10.0 - 20.0 ug/mL  15.8      CBC Latest Ref Rng & Units 08/01/2017 08/01/2017 07/31/2017  WBC 4.0 - 10.5 K/uL - 13.2(H) 6.5  Hemoglobin 13.0 - 17.0 g/dL 2.1(H) 0.8(M) 57.8  Hematocrit 39.0 - 52.0 % 29.9(L) 29.6(L) 43.2  Platelets 150 - 400 K/uL - 218 153    BMP Latest Ref Rng & Units 08/01/2017 07/31/2017 07/29/2017  Glucose 65 - 99 mg/dL 469(G) 295(M) 841(L)  BUN 6 - 20 mg/dL 24(M) 01(U) 19  Creatinine 0.61 - 1.24 mg/dL 2.72 5.36 6.44  Sodium 135 - 145 mmol/L 144 143 143  Potassium 3.5 - 5.1 mmol/L 3.9 4.9 4.2  Chloride 101 - 111 mmol/L 111 112(H) 111  CO2 22 - 32 mmol/L Calcium 8.9 - 10.3 mg/dL 0.3(K) 7.4(Q) 5.9(D)    Ct brain 07/03/17 1. No acute intracranial abnormality. 2. ASPECTS = 10. 3. Moderate cerebral atrophy with chronic small vessel ischemic disease with multifocal remote ischemic infarcts as above.  07-31-17 MRI brain 1. 1 cm acute ischemic nonhemorrhagic infarct involving the subcortical/deep white matter of the left parietal lobe. 2. Remote left posterior frontal hemorrhage, with smaller right posterior  frontal and left occipital lobe infarcts. 3. Atrophy with extensive chronic microvascular ischemic disease.  cxr 08-01-17 The tip of the endotracheal tube is now 5.6 cm above the carina. Bibasilar linear atelectasis remains left-greater-than-right. No definite pleural effusion is seen although a small left effusion cannot be excluded. The heart is mildly enlarged and stable. No bony abnormality is seen.   CT brain 2017-08-02 1. Difficult to exclude trace acute parafalcine subdural hematoma (series 3, image 25), but this could be artifact due to motion. 2. Otherwise stable non contrast CT appearance of the brain, with multifocal encephalomalacia and advanced chronic  small vessel disease. ASPECTS is 10.   EEG 07/31/17 This EEG is indicative of encephalopathy of nonspecific etiologies.  Left lateralizing poorly formed,  irregular and attenuated  epileptiform discharges present throughout the recording.  No clinical subclinical seizures.  This finding suggestive of encephalopathy and cortical irritability across the left hemisphere.  Clinical correlation is advised.   Aug 02, 2017 Ekg- sinus- , NS ST T changes    Assessment- Plan:      This is a 72 year old male with a prior history of intracranial hemorrhage coronary artery disease type 2 diabetes and a listed history of CHF who presented 4/29 with status epilepticus likely r/t medication noncompliance.   Seizures did not respond to initial therapy and he heavy sedation for burst suppression for control.  No purposeful waking after burst suppression despite sedation being off since 5/4.   MRI 5/6 revealed new acute ischemic L parietal infarct.    Decision made 5/7 with palliative care assistance for one-way extubation and transition to full comfort care.    VDRF r/t status epilepticus - extubated 5/7 ->CMO Status epilepticus - s/p burst suppression  Acute L parietal ischemic infarct  Hx ICH  Hx DM, CAD, STEMI, CHF, ETOH abuse  PLAN -  Continue morphine gtt  scopolamine patch  PRN ativan  Continue robinul  Palliative care f/u  Anticipate death in hospital  No family at bedside 5/9  May transfer to     6N pvt room, if ICU bed needed.   Caryl Comes, MD Pulmonary and Critical Care Medicine [place] [facility] Pager: [phone]

## 2017-08-26 NOTE — Progress Notes (Addendum)
Daily Progress Note   Patient Name: Richard [name]       Date: 14-Aug-2017 DOB: 06/30/1945  Age: 72 y.o. MRN#: [id number] Attending Physician: Lupita Leash, MD Primary Care Physician: Elias Else, MD Admit Date: 07/08/2017  Reason for Consultation/Follow-up: Establishing goals of care and Terminal Care  Subjective: Patient actively dying. Unresponsive to sternal rub. Shallow, regular respirations. RR 14. Appears comfortable on continuous morphine infusion. No family at bedside.   Length of Stay: 10  Current Medications: Scheduled Meds:  . glycopyrrolate  0.3 mg Intravenous Q4H  . scopolamine  1 patch Transdermal Q72H    Continuous Infusions: . morphine 8 mg/hr (08-14-17 0800)    PRN Meds: antiseptic oral rinse, glycopyrrolate, midazolam, morphine, ondansetron (ZOFRAN) IV, polyvinyl alcohol  Physical Exam  HENT:  Head: Normocephalic and atraumatic.  Cardiovascular: Tachycardia present.  Pulmonary/Chest: No accessory muscle usage. No tachypnea. No respiratory distress. He has decreased breath sounds.  Shallow, regular  Abdominal: There is no tenderness.  Neurological: He is unresponsive.  Skin: Skin is warm and dry.  Edematous   Nursing note and vitals reviewed.          Vital Signs: BP 127/77   Pulse (!) 117   Temp 98.3 F (36.8 C) (Axillary)   Resp 19   Ht  (1.854 m)   Wt 94.1 kg (207 lb 7.3 oz)   SpO2 (!) 82%   BMI 27.37 kg/m  SpO2: SpO2: (!) 82 % O2 Device: O2 Device: Room Air O2 Flow Rate:    Intake/output summary:   Intake/Output Summary (Last 24 hours) at Aug 14, 2017 0941 Last data filed at 08-14-2017 0800 Gross per 24 hour  Intake 184 ml  Output 800 ml  Net -616 ml   LBM: Last BM Date: (UTA) Baseline Weight: Weight: 97.6 kg (215 lb 2.7 oz) Most  recent weight: Weight: 94.1 kg (207 lb 7.3 oz)       Palliative Assessment/Data: PPS 10%   Flowsheet Rows     Most Recent Value  Intake Tab  Referral Department  Critical care  Unit at Time of Referral  ICU  Palliative Care Primary Diagnosis  Neurology  Palliative Care Type  New Palliative care  Reason for referral  Clarify Goals of Care, End of Life Care Assistance  Date first seen by Palliative Care  08/01/17  Clinical Assessment  Palliative Performance Scale Score  10%  Psychosocial & Spiritual Assessment  Palliative Care Outcomes  Patient/Family meeting held?  Yes  Who was at the meeting?  wife, son, sister, sister-in-law  Palliative Care Outcomes  Clarified goals of care, Provided psychosocial or spiritual support, ACP counseling assistance, Improved pain interventions, Improved non-pain symptom therapy, Provided end of life care assistance, Changed to focus on comfort, Changed CPR status  Patient/Family wishes: Interventions discontinued/not started   Mechanical Ventilation, Tube feedings/TPN      Patient Active Problem List   Diagnosis Date Noted  . Palliative care by specialist   . Terminal care   . Acute respiratory distress   . Increased oropharyngeal secretions   . Agitation   . Status epilepticus (HCC) 07/02/2017  . Alcohol abuse 07/04/2017  . Acute encephalopathy 07/04/2017  . Seizure (HCC) 07/04/2017  . Seizures (HCC) 07/03/2017  . CAD (coronary artery disease) 06/22/2015  . Hypertensive heart disease 06/14/2015  . Hyperlipidemia 06/14/2015  . 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 Aphasia complicating stroke 02/09/2015  . 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 Type 2 diabetes mellitus with peripheral neuropathy (HCC)   . Essential hypertension   . Alcohol withdrawal (HCC)   . Cerebral hemorrhage (HCC)   . Endotracheally intubated     Palliative Care Assessment & Plan    Patient Profile: 72 y.o. male  with past medical history of stroke, ICH 2016, CAD, MI, vfib arrest, diastolic dysfunction, DM, ETOH abuse admitted on 07/16/2017 with right weakness and jerking and difficulty speaking. Recent hospitalization for diagnosis of complex partial seizure thought to be secondary to left parietal encephalomalacia. In ED, patient was actively seizing and was loaded with keppra and given fosphenytoin. Required intubation for airway protection. Neurology following. Patient receiving keppra, vimpat, dilantin, topomax, and phenobarbital. Continuous EEG reveals encephalopathy of nonspecific etiology. CT brain 4/29 with multifocal encephalomalacia and advanced chronic small vessel disease. MRI on 5/6 revealed 1cm acute ischemic nonhemorrhagic infarct involving subcortical/deep white matter of left parietal lobe, remote left posterior frontal hemorrhage, and atrophy with extensive chronic microvascular ischemic disease. Off sedation since 5/4 at 2pm. Patient remains obtunded on ventilator. Palliative medicine consultation for goals of care. Terminal extubation to comfort care on 5/7.  Assessment: Seizures Status epilepticus VDRF Acute left parietal ischemic infarct Hx ICH  Recommendations/Plan:  Comfort measures only.  Continue current medication regimen to ensure comfort.   Actively dying. Hours-days. Anticipate hospital death.  May transfer to [facility] for comfort care if bed available.   Goals of Care and Additional Recommendations:  Limitations on Scope of Treatment: Full Comfort Care  Code Status: DNR/DNI   Code Status Orders  (From admission, onward)        Start     Ordered   08/01/17 1524  Do not attempt resuscitation (DNR)  Continuous    Question Answer Comment  In the event of cardiac or respiratory ARREST Do not call a "code blue"   In the event of cardiac or respiratory ARREST Do not perform Intubation, CPR, defibrillation or ACLS   In the event of  cardiac or respiratory ARREST Use medication by any route, position, wound care, and other measures to relive pain and suffering. May use oxygen, suction and manual treatment of airway obstruction as needed for comfort.      08/01/17 1523    Code Status History    Date Active Date Inactive Code Status  Order ID Comments User Context   08/01/2017 1425 08/01/2017 1523 DNR [id number]  Alita Chyle, NP Inpatient   06-Aug-2017 1220 08/01/2017 1425 Full Code [id number]  Burna Cash, MD ED   07/04/2017 0114 07/13/2017 1559 Full Code [id number]  Eduard Clos, MD Inpatient   12/16/2014 2153 12/26/2014 1558 Full Code [id number]  Charlton Amor, PA-C Inpatient   12/16/2014 2153 12/16/2014 2153 Full Code [id number]  Charlton Amor, PA-C Inpatient   12/10/2014 1002 12/16/2014 2153 Full Code [id number]  Lunette Stands, MD ED       Prognosis:   Hours - Days  Discharge Planning:  Anticipated Hospital Death  Care plan was discussed: No family at bedside  Thank you for allowing the Palliative Medicine Team to assist in the care of this patient.   Time In: 0930 Time Out: 0945 Total Time Prolonged Time Billed no      Greater than 50%  of this time was spent counseling and coordinating care related to the above assessment and plan.  Vennie Homans, FNP-C Palliative Medicine Team  Phone: [phone] Fax: [phone]  Please contact Palliative Medicine Team phone at [phone] for questions and concerns.

## 2017-08-26 NOTE — Progress Notes (Signed)
Patient cardiac time of death 1. Family notified and are on the way. CDS notified. Jhanvi Drakeford, [place] Scrape, RN

## 2017-08-26 NOTE — Death Summary Note (Signed)
PULMONARY / CRITICAL CARE MEDICINE  Name: Richard [name] MRN: [id number] DOB: Jul 20, 1945    ADMISSION DATE:  2017/08/19  cardiac time of death 1052. AM- date- Aug 29, 2017   CAUSE OF DEATH-  CP ARREST, STATUS EPILEPTICUS, HYPOXIC ENCEPHALOPATHY     HISTORY    Richard [name] is a 72 yo M with history of L frontal stroke, intracranial hemorrhage 2016, CAD, STEMI s/p PCI, VFib arrest, HFpEF (no recent echo), T2DM, and recent diagnosis of complex partial seizure thought to be secondary to L parietal encephalomalacia who presented to the ED with acute encephalopathy thought to be due to acute CVA but found to be seizing. He was recently admitted for seizures and was discharged on 4/18 with instructions to start Keppra.     Extubated to CMO 5/7 Appears to be actively dying.    Appears comfortable.  On MSo4 drip 8 mg/hr        CBC Latest Ref Rng & Units 08/01/2017 08/01/2017 07/31/2017  WBC 4.0 - 10.5 K/uL - 13.2(H) 6.5  Hemoglobin 13.0 - 17.0 g/dL 4.0(J) 8.1(X) 91.4  Hematocrit 39.0 - 52.0 % 29.9(L) 29.6(L) 43.2  Platelets 150 - 400 K/uL - 218 153    BMP Latest Ref Rng & Units 08/01/2017 07/31/2017 07/29/2017  Glucose 65 - 99 mg/dL 782(N) 562(Z) 308(M)  BUN 6 - 20 mg/dL 57(Q) 46(N) 19  Creatinine 0.61 - 1.24 mg/dL 6.29 5.28 4.13  Sodium 135 - 145 mmol/L 144 143 143  Potassium 3.5 - 5.1 mmol/L 3.9 4.9 4.2  Chloride 101 - 111 mmol/L 111 112(H) 111  CO2 22 - 32 mmol/L Calcium 8.9 - 10.3 mg/dL 2.4(M) 0.1(U) 2.7(O)    Ct brain 07/03/17 1. No acute intracranial abnormality. 2. ASPECTS = 10. 3. Moderate cerebral atrophy with chronic small vessel ischemic disease with multifocal remote ischemic infarcts as above.  07-31-17 MRI brain 1. 1 cm acute ischemic nonhemorrhagic infarct involving the subcortical/deep white matter of the left parietal lobe. 2. Remote left posterior frontal hemorrhage, with smaller right posterior frontal and left occipital lobe infarcts. 3. Atrophy with extensive  chronic microvascular ischemic disease.  cxr 08-01-17 The tip of the endotracheal tube is now 5.6 cm above the carina. Bibasilar linear atelectasis remains left-greater-than-right. No definite pleural effusion is seen although a small left effusion cannot be excluded. The heart is mildly enlarged and stable. No bony abnormality is seen.   CT brain 2017/08/19 1. Difficult to exclude trace acute parafalcine subdural hematoma (series 3, image 25), but this could be artifact due to motion. 2. Otherwise stable non contrast CT appearance of the brain, with multifocal encephalomalacia and advanced chronic small vessel disease. ASPECTS is 10.   EEG 07/31/17 This EEG is indicative of encephalopathy of nonspecific etiologies.  Left lateralizing poorly formed,  irregular and attenuated  epileptiform discharges present throughout the recording.  No clinical subclinical seizures.  This finding suggestive of encephalopathy and cortical irritability across the left hemisphere.  Clinical correlation is advised.   2017-08-19 Ekg- sinus- , NS ST T changes   SEE PROGRESS NOTE/CAHRT FOR MORE DETAILS.  CONSULTATION BY: NEUROLOGY     Decision made 5/7 with palliative care assistance for one-way extubation and transition to full comfort care.    VDRF r/t status epilepticus - extubated 5/7 ->CMO Status epilepticus - s/p burst suppression  Acute L parietal ischemic infarct  Hx ICH  Hx DM, CAD, STEMI, CHF, ETOH abuse  PT EXPIRED PEACEFULLY; FAMILY TO ARRIVE SOON.  Caryl Comes, MD Pulmonary and Critical Care Medicine [facility] Pager: [phone]

## 2017-08-26 NOTE — Progress Notes (Signed)
40mL of IV Morphine wasted in the sink with Building surveyor. Cas Tracz, [place] Scrape, RN

## 2017-08-26 DEATH — deceased

## 2019-11-03 IMAGING — DX DG CHEST 1V PORT
1 series · 1 of 1 positions shown · non-contrast
Comparison: July 05, 2017

CLINICAL DATA: Shortness of breath

EXAM:
PORTABLE CHEST 1 VIEW

[chest]
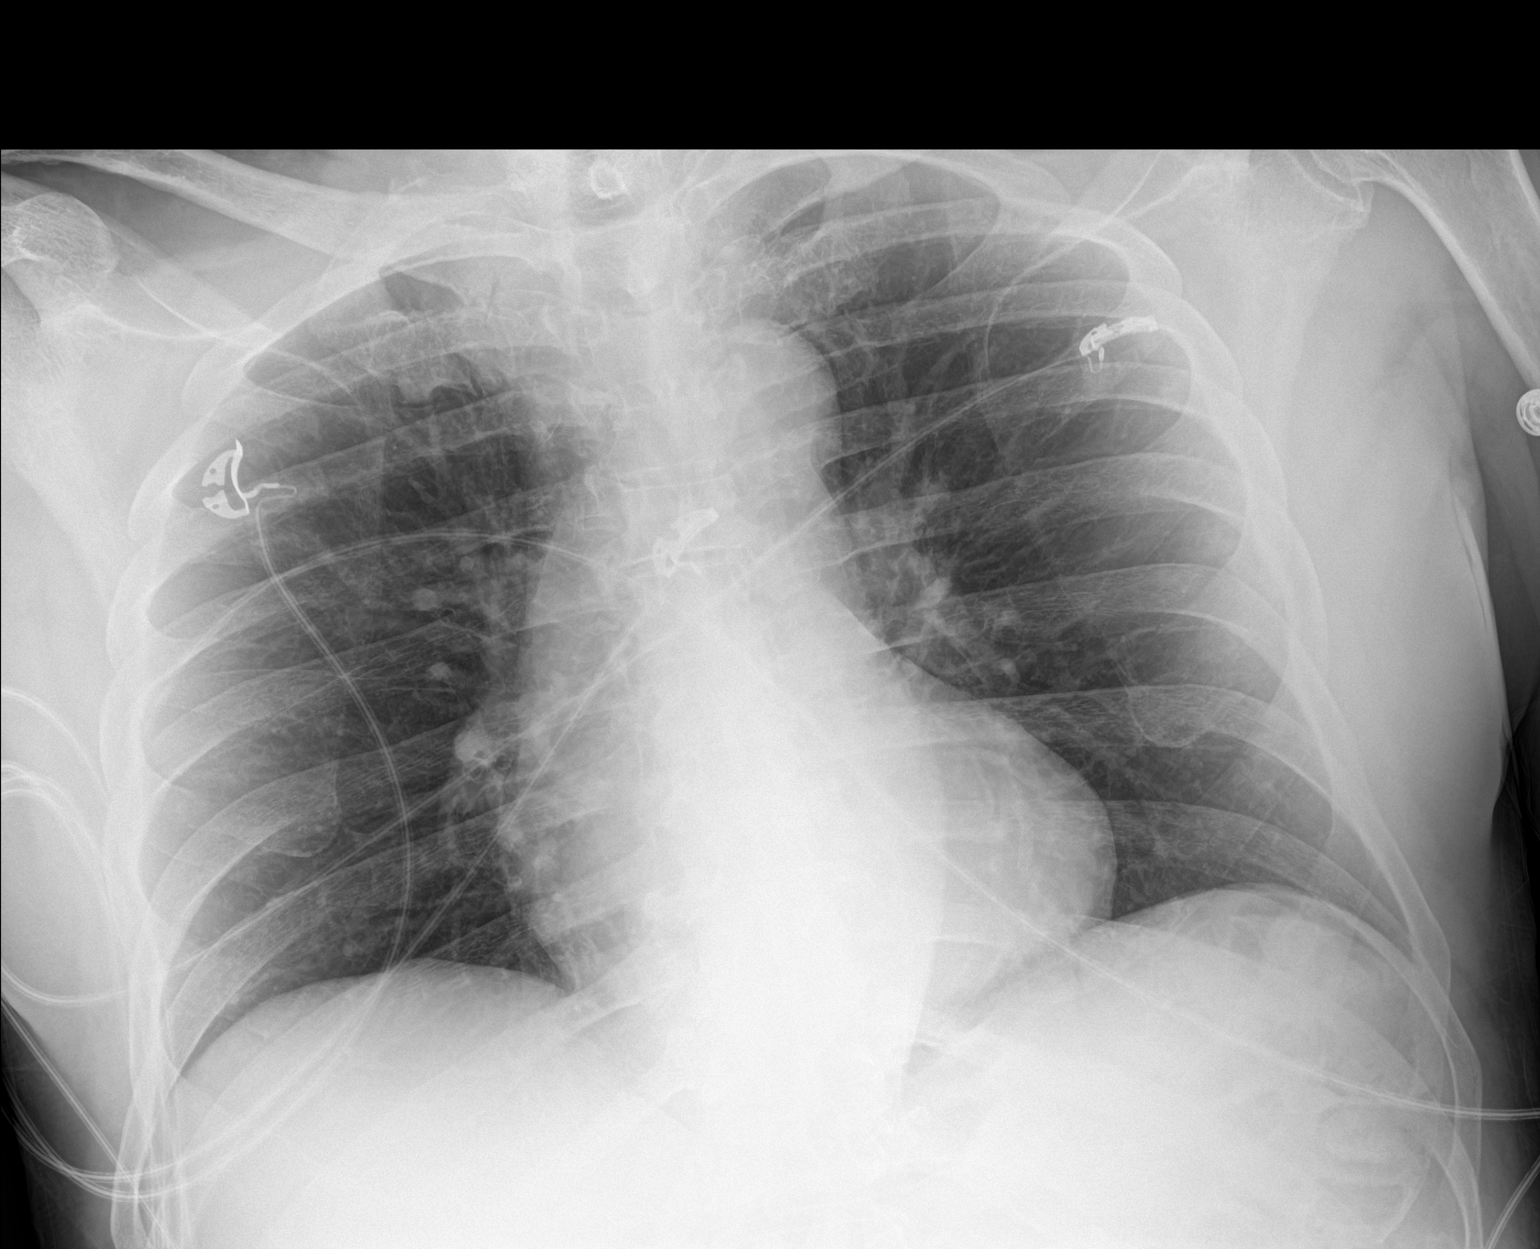

[1 of 1 positions shown; findings below may reference images not displayed]

FINDINGS: The cardiomediastinal silhouette is stable. No pneumothorax. No
nodules or masses. No focal infiltrates.
IMPRESSION: No active disease.
